# Patient Record
Sex: Female | Born: 1945 | Race: White | Hispanic: No | Marital: Married | Smoking: Former smoker
Health system: Southern US, Community
[De-identification: ages and names within clinical notes are randomized; demographics above are authoritative.]

## PROBLEM LIST (undated history)

## (undated) DIAGNOSIS — M8580 Other specified disorders of bone density and structure, unspecified site: Secondary | ICD-10-CM

## (undated) DIAGNOSIS — Z789 Other specified health status: Secondary | ICD-10-CM

## (undated) DIAGNOSIS — K219 Gastro-esophageal reflux disease without esophagitis: Secondary | ICD-10-CM

## (undated) DIAGNOSIS — Z8719 Personal history of other diseases of the digestive system: Secondary | ICD-10-CM

## (undated) DIAGNOSIS — Z972 Presence of dental prosthetic device (complete) (partial): Secondary | ICD-10-CM

## (undated) DIAGNOSIS — M810 Age-related osteoporosis without current pathological fracture: Secondary | ICD-10-CM

## (undated) DIAGNOSIS — J479 Bronchiectasis, uncomplicated: Secondary | ICD-10-CM

## (undated) DIAGNOSIS — K449 Diaphragmatic hernia without obstruction or gangrene: Secondary | ICD-10-CM

## (undated) DIAGNOSIS — R32 Unspecified urinary incontinence: Secondary | ICD-10-CM

## (undated) DIAGNOSIS — M1990 Unspecified osteoarthritis, unspecified site: Secondary | ICD-10-CM

## (undated) DIAGNOSIS — R42 Dizziness and giddiness: Secondary | ICD-10-CM

## (undated) DIAGNOSIS — G4762 Sleep related leg cramps: Secondary | ICD-10-CM

## (undated) DIAGNOSIS — F329 Major depressive disorder, single episode, unspecified: Secondary | ICD-10-CM

## (undated) DIAGNOSIS — E7800 Pure hypercholesterolemia, unspecified: Secondary | ICD-10-CM

## (undated) DIAGNOSIS — Z973 Presence of spectacles and contact lenses: Secondary | ICD-10-CM

## (undated) DIAGNOSIS — R002 Palpitations: Secondary | ICD-10-CM

## (undated) DIAGNOSIS — R7303 Prediabetes: Secondary | ICD-10-CM

## (undated) DIAGNOSIS — K08109 Complete loss of teeth, unspecified cause, unspecified class: Secondary | ICD-10-CM

## (undated) DIAGNOSIS — E785 Hyperlipidemia, unspecified: Secondary | ICD-10-CM

## (undated) DIAGNOSIS — K5090 Crohn's disease, unspecified, without complications: Secondary | ICD-10-CM

## (undated) DIAGNOSIS — IMO0001 Reserved for inherently not codable concepts without codable children: Secondary | ICD-10-CM

## (undated) DIAGNOSIS — D649 Anemia, unspecified: Secondary | ICD-10-CM

## (undated) DIAGNOSIS — R0600 Dyspnea, unspecified: Secondary | ICD-10-CM

## (undated) DIAGNOSIS — G4730 Sleep apnea, unspecified: Secondary | ICD-10-CM

## (undated) DIAGNOSIS — K5790 Diverticulosis of intestine, part unspecified, without perforation or abscess without bleeding: Secondary | ICD-10-CM

## (undated) DIAGNOSIS — E538 Deficiency of other specified B group vitamins: Secondary | ICD-10-CM

## (undated) HISTORY — PX: SHOULDER SURGERY: SHX246

## (undated) HISTORY — DX: Reserved for inherently not codable concepts without codable children: IMO0001

## (undated) HISTORY — PX: FOOT SURGERY: SHX648

## (undated) HISTORY — PX: CHOLECYSTECTOMY: SHX55

## (undated) HISTORY — PX: NO PAST SURGERIES: SHX2092

## (undated) HISTORY — DX: Crohn's disease, unspecified, without complications: K50.90

## (undated) HISTORY — PX: ABDOMINAL HYSTERECTOMY: SHX81

## (undated) HISTORY — PX: APPENDECTOMY: SHX54

## (undated) HISTORY — DX: Bronchiectasis, uncomplicated: J47.9

## (undated) HISTORY — PX: OTHER SURGICAL HISTORY: SHX169

## (undated) HISTORY — DX: Gastro-esophageal reflux disease without esophagitis: K21.9

## (undated) HISTORY — PX: HAMMER TOE SURGERY: SHX385

## (undated) HISTORY — DX: Unspecified osteoarthritis, unspecified site: M19.90

## (undated) HISTORY — DX: Sleep apnea, unspecified: G47.30

## (undated) HISTORY — PX: DILATION AND CURETTAGE OF UTERUS: SHX78

---

## 2002-04-20 ENCOUNTER — Ambulatory Visit (HOSPITAL_BASED_OUTPATIENT_CLINIC_OR_DEPARTMENT_OTHER): Admission: RE | Admit: 2002-04-20 | Discharge: 2002-04-20 | Payer: Self-pay | Admitting: Orthopaedic Surgery

## 2004-02-15 ENCOUNTER — Ambulatory Visit: Payer: Self-pay | Admitting: Internal Medicine

## 2004-03-04 HISTORY — PX: JOINT REPLACEMENT: SHX530

## 2004-07-11 ENCOUNTER — Ambulatory Visit: Payer: Self-pay | Admitting: Unknown Physician Specialty

## 2004-08-30 ENCOUNTER — Ambulatory Visit: Payer: Self-pay | Admitting: Gastroenterology

## 2004-12-31 ENCOUNTER — Ambulatory Visit: Payer: Self-pay | Admitting: Unknown Physician Specialty

## 2005-03-22 ENCOUNTER — Ambulatory Visit: Payer: Self-pay

## 2005-09-25 ENCOUNTER — Ambulatory Visit: Payer: Self-pay | Admitting: Unknown Physician Specialty

## 2006-05-28 ENCOUNTER — Emergency Department: Payer: Self-pay | Admitting: Emergency Medicine

## 2006-05-28 ENCOUNTER — Other Ambulatory Visit: Payer: Self-pay

## 2006-05-29 ENCOUNTER — Ambulatory Visit: Payer: Self-pay | Admitting: Emergency Medicine

## 2006-09-30 ENCOUNTER — Ambulatory Visit: Payer: Self-pay | Admitting: Unknown Physician Specialty

## 2007-10-29 ENCOUNTER — Ambulatory Visit: Payer: Self-pay | Admitting: Unknown Physician Specialty

## 2008-10-26 ENCOUNTER — Ambulatory Visit: Payer: Self-pay | Admitting: Gastroenterology

## 2008-11-22 ENCOUNTER — Ambulatory Visit: Payer: Self-pay | Admitting: Unknown Physician Specialty

## 2009-02-28 ENCOUNTER — Ambulatory Visit: Payer: Self-pay | Admitting: Unknown Physician Specialty

## 2009-11-13 ENCOUNTER — Emergency Department: Payer: Self-pay | Admitting: Emergency Medicine

## 2009-11-15 ENCOUNTER — Ambulatory Visit: Payer: Self-pay | Admitting: Unknown Physician Specialty

## 2009-12-18 ENCOUNTER — Ambulatory Visit: Payer: Self-pay | Admitting: Unknown Physician Specialty

## 2010-10-02 ENCOUNTER — Emergency Department: Payer: Self-pay

## 2010-10-08 ENCOUNTER — Ambulatory Visit: Payer: Self-pay | Admitting: Unknown Physician Specialty

## 2010-12-11 ENCOUNTER — Ambulatory Visit: Payer: Self-pay | Admitting: Unknown Physician Specialty

## 2011-04-09 ENCOUNTER — Ambulatory Visit: Payer: Self-pay | Admitting: Unknown Physician Specialty

## 2011-04-23 ENCOUNTER — Ambulatory Visit: Payer: Self-pay | Admitting: Specialist

## 2011-05-01 ENCOUNTER — Ambulatory Visit: Payer: Self-pay | Admitting: Unknown Physician Specialty

## 2011-05-12 ENCOUNTER — Ambulatory Visit: Payer: Self-pay | Admitting: Specialist

## 2012-06-05 ENCOUNTER — Ambulatory Visit: Payer: Self-pay | Admitting: Unknown Physician Specialty

## 2012-09-23 ENCOUNTER — Ambulatory Visit: Payer: Self-pay | Admitting: Gastroenterology

## 2012-09-24 LAB — PATHOLOGY REPORT

## 2012-12-22 ENCOUNTER — Ambulatory Visit: Payer: Self-pay | Admitting: Podiatry

## 2012-12-28 ENCOUNTER — Encounter: Payer: Self-pay | Admitting: *Deleted

## 2012-12-29 ENCOUNTER — Ambulatory Visit: Payer: Medicare Other | Admitting: Podiatry

## 2012-12-29 ENCOUNTER — Encounter: Payer: Self-pay | Admitting: Podiatry

## 2012-12-29 VITALS — BP 123/70 | HR 70 | Resp 16 | Ht 62.0 in | Wt 155.0 lb

## 2012-12-29 DIAGNOSIS — L600 Ingrowing nail: Secondary | ICD-10-CM

## 2012-12-29 NOTE — Patient Instructions (Signed)

## 2012-12-29 NOTE — Progress Notes (Signed)
Subjective:     Patient ID: Kimberly Andrade, female   DOB: 02/19/1946, 67 y.o.   MRN: [id number]  Toe Pain    patient presents stating my left big toe nail is thick and is becoming painful and I want it removed like my right big toenail   Review of Systems     Objective:   Physical Exam  Nursing note and vitals reviewed. Constitutional: She is oriented to person, place, and time. She appears well-developed and well-nourished.  Cardiovascular: Intact distal pulses.   Neurological: She is oriented to person, place, and time.   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 Infiltrated the hallux 60 mg Xylocaine Marcaine mixture and removed the nailbed and applied chemical phenol followed by alcohol 3 applications 30 seconds followed by the lavage with alcohol sterile dressing applied and patient will be easy on this for the next several day

## 2013-05-03 ENCOUNTER — Emergency Department: Payer: Self-pay | Admitting: Emergency Medicine

## 2013-05-24 ENCOUNTER — Encounter (HOSPITAL_BASED_OUTPATIENT_CLINIC_OR_DEPARTMENT_OTHER): Payer: Self-pay | Admitting: *Deleted

## 2013-05-24 NOTE — Progress Notes (Signed)
No cardiac problems but has been sent for stress test 2 yr ago-will try to get-no labs do

## 2013-05-24 NOTE — H&P (Signed)
Kimberly Andrade is an 68 y.o. female.   Chief Complaint: Severe right knee pain HPI: Kimberly Andrade is complaining of severe right knee pain that is keeping her from activities of daily living.  She has had a recent injection, which made minimal benefit.  Recent MRI scan dated 05/21/13 shows a medial meniscus tear.  She is having medial joint line pain.  It is very painful for her even night pain despite injection.  Have discussed proceeding with knee arthroscopy right side to take care of the meniscus relieve her pain and improve function.  Past Medical History  Diagnosis Date  . Arthritis   . Reflux   . Crohn disease   . Sleep apnea     uses a cpap  . Full dentures   . Wears glasses     Past Surgical History  Procedure Laterality Date  . Abdominal hysterectomy    . Gallbladder surgery    . Carpel tunnel    . Shoulder surgery Right     X 3  . Foot surgery Right   . Appendectomy    . Colonoscopy    . Upper gi endoscopy      History reviewed. No pertinent family history. Social History:  reports that she quit smoking about 11 years ago. She has never used smokeless tobacco. She reports that she does not drink alcohol or use illicit drugs.  Allergies:  Allergies  Allergen Reactions  . Codeine     gastritis  . Fosamax [Alendronate Sodium]     Bones hurt  . Hydrocodone Nausea And Vomiting  . Lipitor [Atorvastatin]     pain    No prescriptions prior to admission    No results found for this or any previous visit (from the past 48 hour(s)). No results found.  Review of Systems  Constitutional: Negative.   HENT: Negative.   Eyes: Negative.   Respiratory: Negative.   Cardiovascular: Negative.   Gastrointestinal: Negative.   Genitourinary: Negative.   Musculoskeletal: Positive for joint pain.  Skin: Negative.   Neurological: Negative.   Endo/Heme/Allergies: Negative.   Psychiatric/Behavioral: Negative.     Height 5\' 2"  (1.575 m), weight 70.308 kg (155 lb). Physical Exam   Constitutional: She appears well-developed.  HENT:  Head: Normocephalic.  Eyes: Pupils are equal, round, and reactive to light.  Neck: Normal range of motion.  Cardiovascular: Normal heart sounds.   Respiratory: Effort normal.  GI: Soft.  Musculoskeletal:  Right knee exam: Tracy effusion with motion 0-100.  Severe pain medial joint line to palpation, McMurray  testing positive for medial pain.  Ligaments tight.  Neurological: She is alert.  Skin: Skin is warm.  Psychiatric: She has a normal mood and affect.     Assessment/Plan Assessment: Right knee meniscus tear by MRI scan. Plan: Imari is having terrible pain in her right knee.  We've discussed proceeding with a arthroscopy to take care of the torn medial meniscus.  We have discussed the risks of anesthesia, infection as well as DVT associated with arthroscopy.  She will probably need postoperative physical therapy to optimize her results.  Jacquese Cassarino R 05/24/2013, 6:12 PM

## 2013-05-25 ENCOUNTER — Encounter (HOSPITAL_BASED_OUTPATIENT_CLINIC_OR_DEPARTMENT_OTHER): Payer: Self-pay

## 2013-05-25 ENCOUNTER — Other Ambulatory Visit: Payer: Self-pay | Admitting: Orthopaedic Surgery

## 2013-05-25 ENCOUNTER — Encounter (HOSPITAL_BASED_OUTPATIENT_CLINIC_OR_DEPARTMENT_OTHER): Payer: Medicare Other | Admitting: Anesthesiology

## 2013-05-25 ENCOUNTER — Encounter (HOSPITAL_BASED_OUTPATIENT_CLINIC_OR_DEPARTMENT_OTHER)
Admission: RE | Disposition: A | Discharge status: Home / Self Care | Payer: Self-pay | Source: Ambulatory Visit | Attending: Orthopaedic Surgery

## 2013-05-25 ENCOUNTER — Ambulatory Visit (HOSPITAL_BASED_OUTPATIENT_CLINIC_OR_DEPARTMENT_OTHER): Payer: Medicare Other | Admitting: Anesthesiology

## 2013-05-25 ENCOUNTER — Ambulatory Visit (HOSPITAL_BASED_OUTPATIENT_CLINIC_OR_DEPARTMENT_OTHER)
Admission: RE | Admit: 2013-05-25 | Discharge: 2013-05-25 | Disposition: A | Discharge status: Home / Self Care | Payer: Medicare Other | Source: Ambulatory Visit | Attending: Orthopaedic Surgery | Admitting: Orthopaedic Surgery

## 2013-05-25 DIAGNOSIS — K5090 Crohn's disease, unspecified, without complications: Secondary | ICD-10-CM | POA: Insufficient documentation

## 2013-05-25 DIAGNOSIS — M1710 Unilateral primary osteoarthritis, unspecified knee: Secondary | ICD-10-CM | POA: Insufficient documentation

## 2013-05-25 DIAGNOSIS — Z87891 Personal history of nicotine dependence: Secondary | ICD-10-CM | POA: Insufficient documentation

## 2013-05-25 DIAGNOSIS — K219 Gastro-esophageal reflux disease without esophagitis: Secondary | ICD-10-CM | POA: Insufficient documentation

## 2013-05-25 DIAGNOSIS — G4730 Sleep apnea, unspecified: Secondary | ICD-10-CM | POA: Insufficient documentation

## 2013-05-25 DIAGNOSIS — M25561 Pain in right knee: Secondary | ICD-10-CM

## 2013-05-25 DIAGNOSIS — M23329 Other meniscus derangements, posterior horn of medial meniscus, unspecified knee: Secondary | ICD-10-CM | POA: Insufficient documentation

## 2013-05-25 HISTORY — DX: Presence of spectacles and contact lenses: Z97.3

## 2013-05-25 HISTORY — PX: KNEE ARTHROSCOPY: SHX127

## 2013-05-25 HISTORY — DX: Complete loss of teeth, unspecified cause, unspecified class: K08.109

## 2013-05-25 HISTORY — DX: Complete loss of teeth, unspecified cause, unspecified class: Z97.2

## 2013-05-25 LAB — POCT HEMOGLOBIN-HEMACUE: Hemoglobin: 13.3 g/dL (ref 12.0–15.0)

## 2013-05-25 SURGERY — ARTHROSCOPY, KNEE
Anesthesia: General | Site: Knee | Laterality: Right

## 2013-05-25 MED ORDER — FENTANYL CITRATE 0.05 MG/ML IJ SOLN
INTRAMUSCULAR | Status: DC | PRN
  Administered 2013-05-25 (×2): 25 ug via INTRAVENOUS

## 2013-05-25 MED ORDER — MIDAZOLAM HCL 2 MG/2ML IJ SOLN
INTRAMUSCULAR | Status: AC
  Filled 2013-05-25: qty 2

## 2013-05-25 MED ORDER — FENTANYL CITRATE 0.05 MG/ML IJ SOLN
INTRAMUSCULAR | Status: AC
  Filled 2013-05-25: qty 2

## 2013-05-25 MED ORDER — LACTATED RINGERS IV SOLN
INTRAVENOUS | Status: DC
  Administered 2013-05-25 (×2): via INTRAVENOUS

## 2013-05-25 MED ORDER — FENTANYL CITRATE 0.05 MG/ML IJ SOLN
50.0000 ug | INTRAMUSCULAR | Status: DC | PRN
  Administered 2013-05-25: 50 ug via INTRAVENOUS

## 2013-05-25 MED ORDER — CEFAZOLIN SODIUM-DEXTROSE 2-3 GM-% IV SOLR
INTRAVENOUS | Status: DC | PRN
  Administered 2013-05-25: 2 g via INTRAVENOUS

## 2013-05-25 MED ORDER — FENTANYL CITRATE 0.05 MG/ML IJ SOLN
INTRAMUSCULAR | Status: AC
  Filled 2013-05-25: qty 4

## 2013-05-25 MED ORDER — BUPIVACAINE-EPINEPHRINE PF 0.25-1:200000 % IJ SOLN
INTRAMUSCULAR | Status: AC
  Filled 2013-05-25: qty 30

## 2013-05-25 MED ORDER — ONDANSETRON HCL 4 MG/2ML IJ SOLN
INTRAMUSCULAR | Status: DC | PRN
  Administered 2013-05-25: 4 mg via INTRAVENOUS

## 2013-05-25 MED ORDER — DEXAMETHASONE SODIUM PHOSPHATE 4 MG/ML IJ SOLN
INTRAMUSCULAR | Status: DC | PRN
  Administered 2013-05-25: 10 mg via INTRAVENOUS

## 2013-05-25 MED ORDER — PROPOFOL 10 MG/ML IV BOLUS
INTRAVENOUS | Status: DC | PRN
  Administered 2013-05-25: 150 mg via INTRAVENOUS

## 2013-05-25 MED ORDER — HYDROMORPHONE HCL PF 1 MG/ML IJ SOLN
INTRAMUSCULAR | Status: AC
  Filled 2013-05-25: qty 1

## 2013-05-25 MED ORDER — MIDAZOLAM HCL 2 MG/2ML IJ SOLN
1.0000 mg | INTRAMUSCULAR | Status: DC | PRN
  Administered 2013-05-25: 1 mg via INTRAVENOUS

## 2013-05-25 MED ORDER — LIDOCAINE HCL (CARDIAC) 20 MG/ML IV SOLN
INTRAVENOUS | Status: DC | PRN
  Administered 2013-05-25: 50 mg via INTRAVENOUS

## 2013-05-25 MED ORDER — SODIUM CHLORIDE 0.9 % IR SOLN
Status: DC | PRN
  Administered 2013-05-25: 3000 mL

## 2013-05-25 MED ORDER — METHYLPREDNISOLONE ACETATE 80 MG/ML IJ SUSP
INTRAMUSCULAR | Status: DC | PRN
  Administered 2013-05-25: 80 mg

## 2013-05-25 MED ORDER — PROPOFOL 10 MG/ML IV BOLUS
INTRAVENOUS | Status: AC
  Filled 2013-05-25: qty 20

## 2013-05-25 MED ORDER — HYDROMORPHONE HCL PF 1 MG/ML IJ SOLN
0.2500 mg | INTRAMUSCULAR | Status: DC | PRN
  Administered 2013-05-25 (×2): 0.5 mg via INTRAVENOUS

## 2013-05-25 SURGICAL SUPPLY — 40 items
BANDAGE ELASTIC 6 VELCRO ST LF (GAUZE/BANDAGES/DRESSINGS) ×3 IMPLANT
BLADE CUDA 5.5 (BLADE) IMPLANT
BLADE GREAT WHITE 4.2 (BLADE) ×2 IMPLANT
BLADE GREAT WHITE 4.2MM (BLADE) ×1
BNDG GAUZE ELAST 4 BULKY (GAUZE/BANDAGES/DRESSINGS) ×3 IMPLANT
CANISTER SUCT 3000ML (MISCELLANEOUS) IMPLANT
DRAPE ARTHROSCOPY W/POUCH 114 (DRAPES) ×3 IMPLANT
DRAPE U-SHAPE 47X51 STRL (DRAPES) ×3 IMPLANT
DRSG EMULSION OIL 3X3 NADH (GAUZE/BANDAGES/DRESSINGS) ×3 IMPLANT
DURAPREP 26ML APPLICATOR (WOUND CARE) ×3 IMPLANT
ELECT MENISCUS 165MM 90D (ELECTRODE) IMPLANT
ELECT REM PT RETURN 9FT ADLT (ELECTROSURGICAL)
ELECTRODE REM PT RTRN 9FT ADLT (ELECTROSURGICAL) IMPLANT
GLOVE BIO SURGEON STRL SZ8.5 (GLOVE) IMPLANT
GLOVE BIOGEL PI IND STRL 8 (GLOVE) ×1 IMPLANT
GLOVE BIOGEL PI IND STRL 8.5 (GLOVE) ×1 IMPLANT
GLOVE BIOGEL PI INDICATOR 8 (GLOVE) ×2
GLOVE BIOGEL PI INDICATOR 8.5 (GLOVE) ×2
GLOVE SS BIOGEL STRL SZ 8 (GLOVE) ×1 IMPLANT
GLOVE SUPERSENSE BIOGEL SZ 8 (GLOVE) ×2
GOWN STRL REUS W/ TWL LRG LVL3 (GOWN DISPOSABLE) ×1 IMPLANT
GOWN STRL REUS W/ TWL XL LVL3 (GOWN DISPOSABLE) ×1 IMPLANT
GOWN STRL REUS W/TWL LRG LVL3 (GOWN DISPOSABLE) ×2
GOWN STRL REUS W/TWL XL LVL3 (GOWN DISPOSABLE) ×2
IV NS IRRIG 3000ML ARTHROMATIC (IV SOLUTION) ×3 IMPLANT
KNEE WRAP E Z 3 GEL PACK (MISCELLANEOUS) ×3 IMPLANT
MANIFOLD NEPTUNE II (INSTRUMENTS) ×3 IMPLANT
PACK ARTHROSCOPY DSU (CUSTOM PROCEDURE TRAY) ×3 IMPLANT
PACK BASIN DAY SURGERY FS (CUSTOM PROCEDURE TRAY) ×3 IMPLANT
PENCIL BUTTON HOLSTER BLD 10FT (ELECTRODE) IMPLANT
SET ARTHROSCOPY TUBING (MISCELLANEOUS) ×2
SET ARTHROSCOPY TUBING LN (MISCELLANEOUS) ×1 IMPLANT
SHEET MEDIUM DRAPE 40X70 STRL (DRAPES) ×3 IMPLANT
SPONGE GAUZE 4X4 12PLY (GAUZE/BANDAGES/DRESSINGS) ×3 IMPLANT
SYR 3ML 18GX1 1/2 (SYRINGE) IMPLANT
TOWEL OR 17X24 6PK STRL BLUE (TOWEL DISPOSABLE) ×3 IMPLANT
TOWEL OR NON WOVEN STRL DISP B (DISPOSABLE) ×3 IMPLANT
WAND 30 DEG SABER W/CORD (SURGICAL WAND) IMPLANT
WAND STAR VAC 90 (SURGICAL WAND) IMPLANT
WATER STERILE IRR 1000ML POUR (IV SOLUTION) ×3 IMPLANT

## 2013-05-25 NOTE — Interval H&P Note (Signed)
History and Physical Interval Note:  05/25/2013 3:01 PM  Kimberly Andrade  has presented today for surgery, with the diagnosis of Dixon  The various methods of treatment have been discussed with the patient and family. After consideration of risks, benefits and other options for treatment, the patient has consented to  Procedure(s): ARTHROSCOPY KNEE (Right) as a surgical intervention .  The patient's history has been reviewed, patient examined, no change in status, stable for surgery.  I have reviewed the patient's chart and labs.  Questions were answered to the patient's satisfaction.     [name] G

## 2013-05-25 NOTE — Anesthesia Preprocedure Evaluation (Signed)
Anesthesia Evaluation  Patient identified by MRN, date of birth, ID band Patient awake    Reviewed: Allergy & Precautions, H&P , NPO status , Patient's Chart, lab work & pertinent test results  Airway Mallampati: II TM Distance: >3 FB Neck ROM: Full    Dental no notable dental hx. (+) Edentulous Upper, Edentulous Lower, Dental Advisory Given   Pulmonary sleep apnea and Continuous Positive Airway Pressure Ventilation , former smoker,  breath sounds clear to auscultation  Pulmonary exam normal       Cardiovascular negative cardio ROS  Rhythm:Regular Rate:Normal     Neuro/Psych negative neurological ROS  negative psych ROS   GI/Hepatic Neg liver ROS, Crohn's Dz   Endo/Other  negative endocrine ROS  Renal/GU negative Renal ROS  negative genitourinary   Musculoskeletal   Abdominal   Peds  Hematology negative hematology ROS (+)   Anesthesia Other Findings   Reproductive/Obstetrics negative OB ROS                           Anesthesia Physical Anesthesia Plan  ASA: III  Anesthesia Plan: General   Post-op Pain Management:    Induction: Intravenous  Airway Management Planned: LMA  Additional Equipment:   Intra-op Plan:   Post-operative Plan: Extubation in OR  Informed Consent: I have reviewed the patients History and Physical, chart, labs and discussed the procedure including the risks, benefits and alternatives for the proposed anesthesia with the patient or authorized representative who has indicated his/her understanding and acceptance.   Dental advisory given  Plan Discussed with: CRNA  Anesthesia Plan Comments:         Anesthesia Quick Evaluation

## 2013-05-25 NOTE — Anesthesia Postprocedure Evaluation (Signed)
Anesthesia Post Note  Patient: Kimberly Andrade  Procedure(s) Performed: Procedure(s) (LRB): ARTHROSCOPY KNEE (Right)  Anesthesia type: General  Patient location: PACU  Post pain: Pain level controlled  Post assessment: Patient's Cardiovascular Status Stable  Last Vitals:  Filed Vitals:   05/25/13 1615  BP: 134/74  Pulse: 57  Temp:   Resp: 14    Post vital signs: Reviewed and stable  Level of consciousness: alert  Complications: No apparent anesthesia complications

## 2013-05-25 NOTE — Anesthesia Procedure Notes (Signed)
Procedure Name: LMA Insertion Date/Time: 05/25/2013 3:13 PM Performed by: Maryella Shivers Pre-anesthesia Checklist: Patient identified, Emergency Drugs available, Suction available and Patient being monitored Patient Re-evaluated:Patient Re-evaluated prior to inductionOxygen Delivery Method: Circle System Utilized Preoxygenation: Pre-oxygenation with 100% oxygen Intubation Type: IV induction Ventilation: Mask ventilation without difficulty LMA: LMA inserted LMA Size: 4.0 Number of attempts: 1 Airway Equipment and Method: bite block Placement Confirmation: positive ETCO2 Tube secured with: Tape Dental Injury: Teeth and Oropharynx as per pre-operative assessment

## 2013-05-25 NOTE — Transfer of Care (Signed)
Immediate Anesthesia Transfer of Care Note  Patient: Kimberly Andrade  Procedure(s) Performed: Procedure(s): ARTHROSCOPY KNEE (Right)  Patient Location: PACU  Anesthesia Type:General  Level of Consciousness: awake, alert  and oriented  Airway & Oxygen Therapy: Patient Spontanous Breathing and Patient connected to face mask oxygen  Post-op Assessment: Report given to PACU RN and Post -op Vital signs reviewed and stable  Post vital signs: Reviewed and stable  Complications: No apparent anesthesia complications

## 2013-05-25 NOTE — Progress Notes (Signed)
Assisted Dr. Fitzgerald with right, knee block. Side rails up, monitors on throughout procedure. See vital signs in flow sheet. Tolerated Procedure well. 

## 2013-05-25 NOTE — Op Note (Signed)
#  [id number] 

## 2013-05-25 NOTE — Discharge Instructions (Signed)

## 2013-05-26 ENCOUNTER — Encounter (HOSPITAL_BASED_OUTPATIENT_CLINIC_OR_DEPARTMENT_OTHER): Payer: Self-pay | Admitting: Orthopaedic Surgery

## 2013-05-28 NOTE — Op Note (Signed)
NAMEVALORA, Kimberly Andrade NO.:  [id number]  MEDICAL RECORD NO.:  [id number]  LOCATION:                                 FACILITY:  PHYSICIAN:  Monico Blitz. Javia Dillow, M.D.DATE OF BIRTH:  [birth year]  DATE OF PROCEDURE:  05/25/2013 DATE OF DISCHARGE:  05/25/2013                              OPERATIVE REPORT   PREOPERATIVE DIAGNOSES: 1. Right knee torn medial meniscus. 2. Right knee degenerative joint disease.  POSTOPERATIVE DIAGNOSES: 1. Right knee torn medial meniscus. 2. Right knee degenerative joint disease.  PROCEDURES: 1. Right knee partial meniscectomy. 2. Right knee abrasion, chondroplasty patellofemoral.  ANESTHESIA:  General and block.  ATTENDING SURGEON:  Monico Blitz. Rhona Raider, MD  ASSISTANTDorris Fetch, PA  INDICATION FOR PROCEDURE:  The patient is a 68 year old woman with a long history of intense right knee pain.  This is not responded to injections Normocephalic, atraumatic to pills or bracing.  By MRI scan, she has a significant medial meniscus tear.  She has pain which makes it difficult for her to rest and walk and she is offered an arthroscopy. Informed operative consent was obtained after discussion of possible complications including reaction to anesthesia and infection.  SUMMARY OF FINDINGS AND PROCEDURE:  Under general anesthesia and a block, a right knee arthroscopy was performed.  Suprapatellar pouch was benign while the patellofemoral joint exhibited some grade 4 change in a relatively broad area inter-trochlear groove.  This was addressed with abrasion to bleeding bone of some small areas.  The medial compartment exhibited a posterior horn radial tear consistent with her scan.  About 15% partial medial meniscectomy was done back to stable tissues.  She had grade 2 and 3 change in this compartment, but no bare bone.  The lateral compartment exhibited no evidence of meniscal or articular cartilage injury.  The ACL was normal.  She was injected  with the usual agents plus Depo-Medrol and discharged home the same day to follow up in less than a week.  DESCRIPTION OF PROCEDURE:  The patient was taken to an operating suite where general anesthetic was applied without difficulty.  She was also given a block in the pre-anesthesia area.  She was positioned supine and prepped and draped in normal sterile fashion.  After the administration of IV Kefzol and an appropriate time out, an arthroscopy of the right knee was performed through a total of 2 portals.  Findings were as noted above and procedure consisted predominantly.  The partial medial meniscectomy, which was done with basket and shaver removing about 15% of this structure predominantly the posterior horn back to stable tissues.  We then performed the abrasion as listed above.  The knee was thoroughly irrigated followed by placement of Marcaine with epinephrine and morphine plus some Depo-Medrol.  Adaptic was placed over the portals followed by dry gauze and loose Ace wrap.  Estimated blood loss and fluids obtained from anesthesia records.  DISPOSITION:  The patient was extubated in the operating room and taken to recovery in stable condition.  She was to go home same-day and follow up in the office next week.  I will contact her by phone tonight.     Collier Salina  Autumn Patty, [name]   ______________________________ Monico Blitz. Rhona Raider, [name]    PGD/MEDQ  D:  05/25/2013  T:  05/25/2013  Job:  [id number]

## 2013-06-30 ENCOUNTER — Ambulatory Visit: Payer: Self-pay | Admitting: Internal Medicine

## 2013-11-18 DIAGNOSIS — G4733 Obstructive sleep apnea (adult) (pediatric): Secondary | ICD-10-CM | POA: Insufficient documentation

## 2014-06-21 DIAGNOSIS — E538 Deficiency of other specified B group vitamins: Secondary | ICD-10-CM | POA: Insufficient documentation

## 2014-06-28 ENCOUNTER — Other Ambulatory Visit: Payer: Self-pay | Admitting: Internal Medicine

## 2014-06-28 DIAGNOSIS — Z1231 Encounter for screening mammogram for malignant neoplasm of breast: Secondary | ICD-10-CM

## 2014-07-05 ENCOUNTER — Ambulatory Visit
Admission: RE | Admit: 2014-07-05 | Discharge: 2014-07-05 | Disposition: A | Discharge status: Home / Self Care | Payer: Medicare Other | Source: Ambulatory Visit | Attending: Internal Medicine | Admitting: Internal Medicine

## 2014-07-05 ENCOUNTER — Other Ambulatory Visit: Payer: Self-pay | Admitting: Internal Medicine

## 2014-07-05 DIAGNOSIS — Z1231 Encounter for screening mammogram for malignant neoplasm of breast: Secondary | ICD-10-CM | POA: Diagnosis not present

## 2015-04-24 DIAGNOSIS — K5000 Crohn's disease of small intestine without complications: Secondary | ICD-10-CM | POA: Insufficient documentation

## 2015-04-24 DIAGNOSIS — K219 Gastro-esophageal reflux disease without esophagitis: Secondary | ICD-10-CM | POA: Insufficient documentation

## 2015-06-22 ENCOUNTER — Other Ambulatory Visit: Payer: Self-pay | Admitting: Internal Medicine

## 2015-06-22 DIAGNOSIS — Z1239 Encounter for other screening for malignant neoplasm of breast: Secondary | ICD-10-CM

## 2015-06-22 LAB — HM HEPATITIS C SCREENING LAB: HM Hepatitis Screen: NEGATIVE

## 2015-07-17 ENCOUNTER — Other Ambulatory Visit: Payer: Self-pay | Admitting: Internal Medicine

## 2015-07-17 ENCOUNTER — Ambulatory Visit
Admission: RE | Admit: 2015-07-17 | Discharge: 2015-07-17 | Disposition: A | Discharge status: Home / Self Care | Payer: Medicare Other | Source: Ambulatory Visit | Attending: Internal Medicine | Admitting: Internal Medicine

## 2015-07-17 DIAGNOSIS — Z1231 Encounter for screening mammogram for malignant neoplasm of breast: Secondary | ICD-10-CM

## 2015-07-17 DIAGNOSIS — Z1239 Encounter for other screening for malignant neoplasm of breast: Secondary | ICD-10-CM

## 2015-10-05 DIAGNOSIS — E782 Mixed hyperlipidemia: Secondary | ICD-10-CM | POA: Insufficient documentation

## 2015-11-03 ENCOUNTER — Other Ambulatory Visit: Payer: Self-pay | Admitting: Gastroenterology

## 2015-11-03 DIAGNOSIS — R1310 Dysphagia, unspecified: Secondary | ICD-10-CM

## 2015-11-03 DIAGNOSIS — K219 Gastro-esophageal reflux disease without esophagitis: Secondary | ICD-10-CM

## 2015-11-10 ENCOUNTER — Ambulatory Visit
Admission: RE | Admit: 2015-11-10 | Discharge: 2015-11-10 | Disposition: A | Discharge status: Home / Self Care | Payer: Medicare Other | Source: Ambulatory Visit | Attending: Gastroenterology | Admitting: Gastroenterology

## 2015-11-10 DIAGNOSIS — K449 Diaphragmatic hernia without obstruction or gangrene: Secondary | ICD-10-CM | POA: Insufficient documentation

## 2015-11-10 DIAGNOSIS — K219 Gastro-esophageal reflux disease without esophagitis: Secondary | ICD-10-CM | POA: Insufficient documentation

## 2015-11-10 DIAGNOSIS — R1310 Dysphagia, unspecified: Secondary | ICD-10-CM | POA: Insufficient documentation

## 2016-03-08 ENCOUNTER — Encounter: Payer: Self-pay | Admitting: *Deleted

## 2016-03-11 ENCOUNTER — Ambulatory Visit: Payer: Medicare Other | Admitting: Anesthesiology

## 2016-03-11 ENCOUNTER — Encounter: Payer: Self-pay | Admitting: *Deleted

## 2016-03-11 ENCOUNTER — Encounter
Admission: RE | Disposition: A | Discharge status: Home / Self Care | Payer: Self-pay | Source: Ambulatory Visit | Attending: Gastroenterology

## 2016-03-11 ENCOUNTER — Ambulatory Visit
Admission: RE | Admit: 2016-03-11 | Discharge: 2016-03-11 | Disposition: A | Discharge status: Home / Self Care | Payer: Medicare Other | Source: Ambulatory Visit | Attending: Gastroenterology | Admitting: Gastroenterology

## 2016-03-11 DIAGNOSIS — F329 Major depressive disorder, single episode, unspecified: Secondary | ICD-10-CM | POA: Insufficient documentation

## 2016-03-11 DIAGNOSIS — M797 Fibromyalgia: Secondary | ICD-10-CM | POA: Diagnosis not present

## 2016-03-11 DIAGNOSIS — K5730 Diverticulosis of large intestine without perforation or abscess without bleeding: Secondary | ICD-10-CM | POA: Diagnosis not present

## 2016-03-11 DIAGNOSIS — K449 Diaphragmatic hernia without obstruction or gangrene: Secondary | ICD-10-CM | POA: Insufficient documentation

## 2016-03-11 DIAGNOSIS — K210 Gastro-esophageal reflux disease with esophagitis: Secondary | ICD-10-CM | POA: Insufficient documentation

## 2016-03-11 DIAGNOSIS — M810 Age-related osteoporosis without current pathological fracture: Secondary | ICD-10-CM | POA: Insufficient documentation

## 2016-03-11 DIAGNOSIS — K5090 Crohn's disease, unspecified, without complications: Secondary | ICD-10-CM | POA: Insufficient documentation

## 2016-03-11 DIAGNOSIS — M1990 Unspecified osteoarthritis, unspecified site: Secondary | ICD-10-CM | POA: Diagnosis not present

## 2016-03-11 DIAGNOSIS — K2940 Chronic atrophic gastritis without bleeding: Secondary | ICD-10-CM | POA: Diagnosis not present

## 2016-03-11 DIAGNOSIS — Z7982 Long term (current) use of aspirin: Secondary | ICD-10-CM | POA: Diagnosis not present

## 2016-03-11 DIAGNOSIS — R1310 Dysphagia, unspecified: Secondary | ICD-10-CM | POA: Insufficient documentation

## 2016-03-11 DIAGNOSIS — E7800 Pure hypercholesterolemia, unspecified: Secondary | ICD-10-CM | POA: Diagnosis not present

## 2016-03-11 DIAGNOSIS — K3189 Other diseases of stomach and duodenum: Secondary | ICD-10-CM | POA: Diagnosis present

## 2016-03-11 DIAGNOSIS — K2950 Unspecified chronic gastritis without bleeding: Secondary | ICD-10-CM | POA: Diagnosis not present

## 2016-03-11 DIAGNOSIS — K529 Noninfective gastroenteritis and colitis, unspecified: Secondary | ICD-10-CM | POA: Diagnosis not present

## 2016-03-11 DIAGNOSIS — Z79899 Other long term (current) drug therapy: Secondary | ICD-10-CM | POA: Insufficient documentation

## 2016-03-11 DIAGNOSIS — Q399 Congenital malformation of esophagus, unspecified: Secondary | ICD-10-CM | POA: Diagnosis not present

## 2016-03-11 DIAGNOSIS — G4730 Sleep apnea, unspecified: Secondary | ICD-10-CM | POA: Insufficient documentation

## 2016-03-11 HISTORY — DX: Age-related osteoporosis without current pathological fracture: M81.0

## 2016-03-11 HISTORY — PX: ESOPHAGOGASTRODUODENOSCOPY: SHX5428

## 2016-03-11 HISTORY — DX: Pure hypercholesterolemia, unspecified: E78.00

## 2016-03-11 HISTORY — DX: Major depressive disorder, single episode, unspecified: F32.9

## 2016-03-11 HISTORY — PX: COLONOSCOPY WITH PROPOFOL: SHX5780

## 2016-03-11 HISTORY — DX: Gastro-esophageal reflux disease without esophagitis: K21.9

## 2016-03-11 HISTORY — DX: Unspecified osteoarthritis, unspecified site: M19.90

## 2016-03-11 SURGERY — EGD (ESOPHAGOGASTRODUODENOSCOPY)
Anesthesia: General

## 2016-03-11 MED ORDER — PROPOFOL 10 MG/ML IV BOLUS
INTRAVENOUS | Status: DC | PRN
  Administered 2016-03-11: 80 mg via INTRAVENOUS

## 2016-03-11 MED ORDER — PROPOFOL 500 MG/50ML IV EMUL
INTRAVENOUS | Status: AC
  Filled 2016-03-11: qty 50

## 2016-03-11 MED ORDER — SODIUM CHLORIDE 0.9 % IV SOLN
INTRAVENOUS | Status: DC
  Administered 2016-03-11 (×2): via INTRAVENOUS

## 2016-03-11 MED ORDER — PROPOFOL 500 MG/50ML IV EMUL
INTRAVENOUS | Status: DC | PRN
  Administered 2016-03-11: 150 ug/kg/min via INTRAVENOUS

## 2016-03-11 MED ORDER — SODIUM CHLORIDE 0.9 % IV SOLN: INTRAVENOUS | Status: DC

## 2016-03-11 MED ORDER — PHENYLEPHRINE HCL 10 MG/ML IJ SOLN
INTRAMUSCULAR | Status: DC | PRN
  Administered 2016-03-11 (×2): 100 ug via INTRAVENOUS

## 2016-03-11 NOTE — Op Note (Signed)
[facility]astroenterology Patient Name: Kimberly Andrade Procedure Date: 03/11/2016 12:51 PM MRN: [id number] Account #: [id number] Date of Birth: 1945/12/09 Admit Type: Outpatient Age: 71 Room: [facility] ENDO ROOM 3 Gender: Female Note Status: Finalized Procedure:            Upper GI endoscopy Indications:          Dysphagia, Gastro-esophageal reflux disease Providers:            Lollie Sails, MD Referring MD:         Murlean Iba, MD (Referring MD) Medicines:            Monitored Anesthesia Care Complications:        No immediate complications. Procedure:            Pre-Anesthesia Assessment:                       - ASA Grade Assessment: III - A patient with severe                        systemic disease.                       After obtaining informed consent, the endoscope was                        passed under direct vision. Throughout the procedure,                        the patient's blood pressure, pulse, and oxygen                        saturations were monitored continuously. The Endoscope                        was introduced through the mouth, and advanced to the                        third part of duodenum. The upper GI endoscopy was                        accomplished without difficulty. The patient tolerated                        the procedure well. Findings:      The Z-line was regular. Biopsies were taken with a cold forceps for       histology.      The distal esophagus was significantly tortuous.      The exam of the stomach was otherwise normal.      A medium-sized hiatal hernia was found. The Z-line was a variable       distance from incisors; the hiatal hernia was sliding.      The examined duodenum was normal.      A single 5 mm submucosal papule (nodule) with no bleeding and no       stigmata of recent bleeding was found on the posterior wall of the       gastric antrum. This had a pillow like palpation, impression of a deep       lipoma. Biopsies were taken with a cold forceps for histology.      Diffuse and patchy mild inflammation  characterized by erythema was found       in the gastric body and in the gastric antrum.      Biopsies were taken with a cold forceps in the gastric body and in the       gastric antrum for histology. Impression:           - Z-line regular. Biopsied.                       - Tortuous esophagus.                       - Medium-sized hiatal hernia.                       - Normal examined duodenum.                       - A single submucosal papule (nodule) found in the                        stomach. Biopsied.                       - Atrophic gastritis.                       - Biopsies were taken with a cold forceps for histology                        in the gastric body and in the gastric antrum. Recommendation:       - Discharge patient to home.                       - Continue present medications.                       - Return to GI clinic in 3 weeks. Procedure Code(s):    --- Professional ---                       [phone], Esophagogastroduodenoscopy, flexible, transoral;                        with biopsy, single or multiple Diagnosis Code(s):    --- Professional ---                       Q39.9, Congenital malformation of esophagus, unspecified                       K44.9, Diaphragmatic hernia without obstruction or                        gangrene                       K31.89, Other diseases of stomach and duodenum                       K29.40, Chronic atrophic gastritis without bleeding                       R13.10, Dysphagia, unspecified  K21.9, Gastro-esophageal reflux disease without                        esophagitis CPT copyright 2016 American Medical Association. All rights reserved. The codes documented in this report are preliminary and upon coder review may  be revised to meet current compliance requirements. Lollie Sails, MD 03/11/2016  1:40:14 PM This report has been signed electronically. Number of Addenda: 0 Note Initiated On: 03/11/2016 12:51 PM      [facility]

## 2016-03-11 NOTE — Op Note (Signed)
[facility] Patient Name: Phyllistine Domingos Procedure Date: 03/11/2016 12:52 PM MRN: [id number] Account #: [id number] Date of Birth: [birth year] Admit Type: Outpatient Age: 71 Room: [facility] ENDO ROOM 1 Gender: Female Note Status: Finalized Procedure:            Colonoscopy Indications:          Personal history of Crohn's disease Providers:            Lollie Sails, MD Medicines:            Monitored Anesthesia Care Complications:        No immediate complications. Procedure:            Pre-Anesthesia Assessment:                       - ASA Grade Assessment: III - A patient with severe                        systemic disease.                       After obtaining informed consent, the colonoscope was                        passed under direct vision. Throughout the procedure,                        the patient's blood pressure, pulse, and oxygen                        saturations were monitored continuously. The                        Colonoscope was introduced through the anus and                        advanced to the the terminal ileum. The colonoscopy was                        performed without difficulty. The patient tolerated the                        procedure well. The quality of the bowel preparation                        was good. Findings:      The terminal ileum appeared normal. Biopsies were taken with a cold       forceps for histology.      Multiple medium-mouthed diverticula were found in the sigmoid colon and       descending colon.      Biopsies for histology were taken with a cold forceps from the cecum,       ascending colon, transverse colon, descending colon, sigmoid colon and       rectum for evaluation of microscopic colitis.      The digital rectal exam was normal. Impression:           - The examined portion of the ileum was normal.                        Biopsied.                       -  Diverticulosis in the  sigmoid colon and in the                        descending colon.                       - Biopsies were taken with a cold forceps from the                        cecum, ascending colon, transverse colon, descending                        colon, sigmoid colon and rectum for evaluation of                        microscopic colitis. Recommendation:       - Discharge patient to home.                       - Full liquid diet for 1 day, then advance as tolerated                        to advance diet as tolerated.                       - Await pathology results.                       - Return to GI office in 3 weeks. Procedure Code(s):    --- Professional ---                       [phone], Colonoscopy, flexible; with biopsy, single or                        multiple Diagnosis Code(s):    --- Professional ---                       Z87.19, Personal history of other diseases of the                        digestive system                       K57.30, Diverticulosis of large intestine without                        perforation or abscess without bleeding CPT copyright 2016 American Medical Association. All rights reserved. The codes documented in this report are preliminary and upon coder review may  be revised to meet current compliance requirements. Lollie Sails, MD 03/11/2016 2:15:38 PM This report has been signed electronically. Number of Addenda: 0 Note Initiated On: 03/11/2016 12:52 PM Scope Withdrawal Time: 0 hours 14 minutes 58 seconds  Total Procedure Duration: 0 hours 29 minutes 59 seconds       [facility]

## 2016-03-11 NOTE — Anesthesia Postprocedure Evaluation (Signed)
Anesthesia Post Note  Patient: ASIANNA LIAKOS  Procedure(s) Performed: Procedure(s) (LRB): ESOPHAGOGASTRODUODENOSCOPY (EGD) (N/A) COLONOSCOPY WITH PROPOFOL (N/A)  Patient location during evaluation: Endoscopy Anesthesia Type: General Level of consciousness: awake and alert and oriented Pain management: pain level controlled Vital Signs Assessment: post-procedure vital signs reviewed and stable Respiratory status: spontaneous breathing, nonlabored ventilation and respiratory function stable Cardiovascular status: blood pressure returned to baseline and stable Postop Assessment: no signs of nausea or vomiting Anesthetic complications: no     Last Vitals:  Vitals:   03/11/16 1419 03/11/16 1429  BP: (!) 96/58 101/63  Pulse: 63 (!) 58  Resp: 11 17  Temp: (!) 35.7 C     Last Pain:  Vitals:   03/11/16 1429  TempSrc:   PainSc: 3                  Ashland Wiseman

## 2016-03-11 NOTE — Transfer of Care (Signed)
Immediate Anesthesia Transfer of Care Note  Patient: Kimberly Andrade  Procedure(s) Performed: Procedure(s): ESOPHAGOGASTRODUODENOSCOPY (EGD) (N/A) COLONOSCOPY WITH PROPOFOL (N/A)  Patient Location: PACU  Anesthesia Type:General  Level of Consciousness: sedated  Airway & Oxygen Therapy: Patient Spontanous Breathing and Patient connected to nasal cannula oxygen  Post-op Assessment: Report given to RN and Post -op Vital signs reviewed and stable  Post vital signs: Reviewed and stable  Last Vitals:  Vitals:   03/11/16 1201 03/11/16 1419  BP: (!) 117/51 (!) 96/58  Pulse: 71 63  Resp: 14 11  Temp: (!) 35.9 C (!) 35.7 C    Last Pain:  Vitals:   03/11/16 1419  TempSrc: Tympanic         Complications: No apparent anesthesia complications

## 2016-03-11 NOTE — Anesthesia Preprocedure Evaluation (Signed)
Anesthesia Evaluation  Patient identified by MRN, date of birth, ID band Patient awake    Reviewed: Allergy & Precautions, NPO status , Patient's Chart, lab work & pertinent test results  History of Anesthesia Complications Negative for: history of anesthetic complications  Airway Mallampati: II  TM Distance: >3 FB Neck ROM: Full    Dental  (+) Upper Dentures, Lower Dentures   Pulmonary sleep apnea, Continuous Positive Airway Pressure Ventilation and Oxygen sleep apnea , neg COPD, former smoker,    breath sounds clear to auscultation- rhonchi (-) wheezing      Cardiovascular Exercise Tolerance: Good (-) hypertension(-) CAD and (-) Past MI  Rhythm:Regular Rate:Normal - Systolic murmurs and - Diastolic murmurs    Neuro/Psych Depression negative neurological ROS     GI/Hepatic Neg liver ROS, GERD  ,crohns disease    Endo/Other  negative endocrine ROSneg diabetes  Renal/GU negative Renal ROS     Musculoskeletal  (+) Arthritis , Fibromyalgia -  Abdominal (+) - obese,   Peds  Hematology negative hematology ROS (+)   Anesthesia Other Findings Past Medical History: No date: Arthritis No date: Crohn disease ([place]) No date: Depression No date: DJD (degenerative joint disease) No date: Fibromyalgia No date: Full dentures No date: GERD (gastroesophageal reflux disease) No date: Hypercholesterolemia No date: Osteoporosis No date: Reflux No date: Sleep apnea     Comment: uses a cpap No date: Wears glasses   Reproductive/Obstetrics                             Anesthesia Physical Anesthesia Plan  ASA: III  Anesthesia Plan: General   Post-op Pain Management:    Induction: Intravenous  Airway Management Planned: Natural Airway  Additional Equipment:   Intra-op Plan:   Post-operative Plan:   Informed Consent: I have reviewed the patients History and Physical, chart, labs and  discussed the procedure including the risks, benefits and alternatives for the proposed anesthesia with the patient or authorized representative who has indicated his/her understanding and acceptance.   Dental advisory given  Plan Discussed with: CRNA and Anesthesiologist  Anesthesia Plan Comments:         Anesthesia Quick Evaluation

## 2016-03-11 NOTE — Anesthesia Procedure Notes (Signed)
Date/Time: 03/11/2016 1:28 PM Performed by: Nelda Marseille Pre-anesthesia Checklist: Patient identified, Emergency Drugs available, Suction available, Patient being monitored and Timeout performed Oxygen Delivery Method: Nasal cannula

## 2016-03-11 NOTE — H&P (Signed)
Outpatient short stay form Pre-procedure 03/11/2016 12:59 PM Kimberly Sails MD  Primary Physician: Dr. Glendon Axe  Reason for visit:  EGD and colonoscopy  History of present illness:  Patient is a 71 year old female presenting today as above. She has a personal history of Crohn's disease that was diagnosed over 10 years ago. She is on a daily dose of Pentasa and has been stable with this her last flare being 2013. She does take an iron supplement which will occasionally dark in her stool. She has some complaint of dysphagia however a barium swallow that was done on 11/10/2015 showed a large sliding hiatal hernia with no prominent reflux and no obstruction. Her barium tablet went down without cause. He has some mild tertiary contractions. The thoracic area the lower cervical area was normal in distention. She tolerated her prep well. She takes no thinning agents or aspirin products with the exception of 81 mg aspirin that has been held for several days.    Current Facility-Administered Medications:  .  0.9 %  sodium chloride infusion, , Intravenous, Continuous, Kimberly Sails, MD, Last Rate: 20 mL/hr at 03/11/16 1220 .  0.9 %  sodium chloride infusion, , Intravenous, Continuous, Kimberly Sails, MD  Prescriptions Prior to Admission  Medication Sig Dispense Refill Last Dose  . Calcium Carb-Cholecalciferol (CALCIUM CARBONATE-VITAMIN D3 PO) Take by mouth.   Past Week at Unknown time  . Cholecalciferol (VITAMIN D-3) 1000 UNITS CAPS Take by mouth 2 (two) times daily.   Past Week at Unknown time  . cyanocobalamin 1000 MCG tablet Take 1,000 mcg by mouth daily.   Past Week at Unknown time  . ferrous sulfate 325 (65 FE) MG tablet Take 325 mg by mouth daily with breakfast.   Past Week at Unknown time  . Garlic 9449 MG CAPS Take by mouth.   Past Week at Unknown time  . Magnesium 250 MG TABS Take by mouth.   Past Week at Unknown time  . mesalamine (PENTASA) 500 MG CR capsule 500 mg. TAKE TWO  CAPSULES FOUR TIMES A DAY   Past Week at Unknown time  . Multiple Vitamin (DAILY VITAMIN PO) Take by mouth daily.   Past Week at Unknown time  . omeprazole (PRILOSEC) 40 MG capsule Take 40 mg by mouth daily.   Past Week at Unknown time  . oxybutynin (DITROPAN-XL) 10 MG 24 hr tablet Take 10 mg by mouth at bedtime.   Past Week at Unknown time  . Potassium 99 MG TABS Take by mouth.   Past Week at Unknown time  . aspirin 81 MG tablet Take 81 mg by mouth daily.   03/06/2016     Allergies  Allergen Reactions  . Codeine Other (See Comments)    gastritis  . Fosamax [Alendronate Sodium] Other (See Comments)    Bones hurt  . Hydrocodone Nausea And Vomiting  . Lipitor [Atorvastatin] Other (See Comments)    pain     Past Medical History:  Diagnosis Date  . Arthritis   . Crohn disease ([place])   . Depression   . DJD (degenerative joint disease)   . Fibromyalgia   . Full dentures   . GERD (gastroesophageal reflux disease)   . Hypercholesterolemia   . Osteoporosis   . Reflux   . Sleep apnea    uses a cpap  . Wears glasses     Review of systems:      Physical Exam    Heart and lungs: Regular rate and rhythm  without rub or gallop, lungs are bilaterally clear.    HEENT: Normocephalic atraumatic eyes are anicteric    Other:     Pertinant exam for procedure: Soft nontender nondistended bowel sounds positive normoactive.    Planned proceedures: EGD, colonoscopy and indicated procedures. I have discussed the risks benefits and complications of procedures to include not limited to bleeding, infection, perforation and the risk of sedation and the patient wishes to proceed.    Kimberly Sails, MD Gastroenterology 03/11/2016  12:59 PM

## 2016-03-12 ENCOUNTER — Encounter: Payer: Self-pay | Admitting: Gastroenterology

## 2016-03-13 LAB — SURGICAL PATHOLOGY

## 2016-04-08 DIAGNOSIS — I493 Ventricular premature depolarization: Secondary | ICD-10-CM | POA: Insufficient documentation

## 2016-07-10 ENCOUNTER — Other Ambulatory Visit: Payer: Self-pay | Admitting: Internal Medicine

## 2016-07-10 DIAGNOSIS — R42 Dizziness and giddiness: Secondary | ICD-10-CM

## 2016-07-11 ENCOUNTER — Other Ambulatory Visit: Payer: Self-pay | Admitting: Internal Medicine

## 2016-07-11 DIAGNOSIS — Z1231 Encounter for screening mammogram for malignant neoplasm of breast: Secondary | ICD-10-CM

## 2016-07-22 ENCOUNTER — Ambulatory Visit: Payer: Medicare Other

## 2016-08-05 ENCOUNTER — Ambulatory Visit
Admission: RE | Admit: 2016-08-05 | Discharge: 2016-08-05 | Disposition: A | Discharge status: Home / Self Care | Payer: Medicare Other | Source: Ambulatory Visit | Attending: Internal Medicine | Admitting: Internal Medicine

## 2016-08-05 DIAGNOSIS — Z1231 Encounter for screening mammogram for malignant neoplasm of breast: Secondary | ICD-10-CM | POA: Insufficient documentation

## 2016-08-06 ENCOUNTER — Ambulatory Visit
Admission: RE | Admit: 2016-08-06 | Discharge: 2016-08-06 | Disposition: A | Discharge status: Home / Self Care | Payer: Medicare Other | Source: Ambulatory Visit | Attending: Internal Medicine | Admitting: Internal Medicine

## 2016-08-06 DIAGNOSIS — R42 Dizziness and giddiness: Secondary | ICD-10-CM | POA: Diagnosis present

## 2016-08-06 DIAGNOSIS — I6523 Occlusion and stenosis of bilateral carotid arteries: Secondary | ICD-10-CM | POA: Insufficient documentation

## 2016-08-06 LAB — HM DEXA SCAN

## 2016-08-06 LAB — HM HEPATITIS C SCREENING LAB: HM Hepatitis Screen: NEGATIVE

## 2016-10-16 DIAGNOSIS — R32 Unspecified urinary incontinence: Secondary | ICD-10-CM | POA: Insufficient documentation

## 2016-10-16 DIAGNOSIS — M810 Age-related osteoporosis without current pathological fracture: Secondary | ICD-10-CM | POA: Insufficient documentation

## 2016-12-25 DIAGNOSIS — G4762 Sleep related leg cramps: Secondary | ICD-10-CM | POA: Insufficient documentation

## 2016-12-25 DIAGNOSIS — R7303 Prediabetes: Secondary | ICD-10-CM | POA: Insufficient documentation

## 2017-04-29 ENCOUNTER — Encounter: Payer: Self-pay | Admitting: Emergency Medicine

## 2017-04-29 ENCOUNTER — Other Ambulatory Visit: Payer: Self-pay

## 2017-04-29 ENCOUNTER — Inpatient Hospital Stay
Admission: EM | Admit: 2017-04-29 | Discharge: 2017-05-01 | DRG: 871 | Disposition: A | Discharge status: Home / Self Care | Payer: Medicare Other | Attending: Internal Medicine | Admitting: Internal Medicine

## 2017-04-29 ENCOUNTER — Emergency Department: Payer: Medicare Other

## 2017-04-29 DIAGNOSIS — J9601 Acute respiratory failure with hypoxia: Secondary | ICD-10-CM | POA: Diagnosis present

## 2017-04-29 DIAGNOSIS — J40 Bronchitis, not specified as acute or chronic: Secondary | ICD-10-CM | POA: Diagnosis not present

## 2017-04-29 DIAGNOSIS — Z9049 Acquired absence of other specified parts of digestive tract: Secondary | ICD-10-CM

## 2017-04-29 DIAGNOSIS — A4189 Other specified sepsis: Secondary | ICD-10-CM | POA: Diagnosis not present

## 2017-04-29 DIAGNOSIS — Z7982 Long term (current) use of aspirin: Secondary | ICD-10-CM

## 2017-04-29 DIAGNOSIS — J101 Influenza due to other identified influenza virus with other respiratory manifestations: Secondary | ICD-10-CM | POA: Diagnosis present

## 2017-04-29 DIAGNOSIS — Z79899 Other long term (current) drug therapy: Secondary | ICD-10-CM

## 2017-04-29 DIAGNOSIS — Z87891 Personal history of nicotine dependence: Secondary | ICD-10-CM

## 2017-04-29 DIAGNOSIS — M1990 Unspecified osteoarthritis, unspecified site: Secondary | ICD-10-CM | POA: Diagnosis present

## 2017-04-29 DIAGNOSIS — Z888 Allergy status to other drugs, medicaments and biological substances status: Secondary | ICD-10-CM

## 2017-04-29 DIAGNOSIS — K219 Gastro-esophageal reflux disease without esophagitis: Secondary | ICD-10-CM | POA: Diagnosis present

## 2017-04-29 DIAGNOSIS — Z973 Presence of spectacles and contact lenses: Secondary | ICD-10-CM

## 2017-04-29 DIAGNOSIS — K5090 Crohn's disease, unspecified, without complications: Secondary | ICD-10-CM | POA: Diagnosis present

## 2017-04-29 DIAGNOSIS — Z972 Presence of dental prosthetic device (complete) (partial): Secondary | ICD-10-CM

## 2017-04-29 DIAGNOSIS — Z885 Allergy status to narcotic agent status: Secondary | ICD-10-CM

## 2017-04-29 DIAGNOSIS — M810 Age-related osteoporosis without current pathological fracture: Secondary | ICD-10-CM | POA: Diagnosis present

## 2017-04-29 DIAGNOSIS — A419 Sepsis, unspecified organism: Secondary | ICD-10-CM | POA: Diagnosis present

## 2017-04-29 DIAGNOSIS — Z800 Family history of malignant neoplasm of digestive organs: Secondary | ICD-10-CM

## 2017-04-29 DIAGNOSIS — Z8249 Family history of ischemic heart disease and other diseases of the circulatory system: Secondary | ICD-10-CM

## 2017-04-29 DIAGNOSIS — J209 Acute bronchitis, unspecified: Secondary | ICD-10-CM | POA: Diagnosis present

## 2017-04-29 DIAGNOSIS — J9811 Atelectasis: Secondary | ICD-10-CM | POA: Diagnosis present

## 2017-04-29 DIAGNOSIS — F329 Major depressive disorder, single episode, unspecified: Secondary | ICD-10-CM | POA: Diagnosis present

## 2017-04-29 DIAGNOSIS — R0902 Hypoxemia: Secondary | ICD-10-CM | POA: Diagnosis present

## 2017-04-29 DIAGNOSIS — I471 Supraventricular tachycardia: Secondary | ICD-10-CM | POA: Diagnosis not present

## 2017-04-29 DIAGNOSIS — E7800 Pure hypercholesterolemia, unspecified: Secondary | ICD-10-CM | POA: Diagnosis present

## 2017-04-29 DIAGNOSIS — Z90710 Acquired absence of both cervix and uterus: Secondary | ICD-10-CM

## 2017-04-29 DIAGNOSIS — M797 Fibromyalgia: Secondary | ICD-10-CM | POA: Diagnosis present

## 2017-04-29 DIAGNOSIS — G4733 Obstructive sleep apnea (adult) (pediatric): Secondary | ICD-10-CM | POA: Diagnosis present

## 2017-04-29 DIAGNOSIS — J45909 Unspecified asthma, uncomplicated: Secondary | ICD-10-CM | POA: Diagnosis present

## 2017-04-29 DIAGNOSIS — E785 Hyperlipidemia, unspecified: Secondary | ICD-10-CM | POA: Diagnosis present

## 2017-04-29 LAB — CBC WITH DIFFERENTIAL/PLATELET
Basophils Absolute: 0 10*3/uL (ref 0–0.1)
Basophils Relative: 0 %
EOS ABS: 0 10*3/uL (ref 0–0.7)
EOS PCT: 0 %
HCT: 40.2 % (ref 35.0–47.0)
Hemoglobin: 13.4 g/dL (ref 12.0–16.0)
LYMPHS ABS: 0.5 10*3/uL — AB (ref 1.0–3.6)
Lymphocytes Relative: 3 %
MCH: 28.6 pg (ref 26.0–34.0)
MCHC: 33.4 g/dL (ref 32.0–36.0)
MCV: 85.4 fL (ref 80.0–100.0)
Monocytes Absolute: 0.7 10*3/uL (ref 0.2–0.9)
Monocytes Relative: 5 %
Neutro Abs: 13.1 10*3/uL — ABNORMAL HIGH (ref 1.4–6.5)
Neutrophils Relative %: 92 %
PLATELETS: 235 10*3/uL (ref 150–440)
RBC: 4.7 MIL/uL (ref 3.80–5.20)
RDW: 14.5 % (ref 11.5–14.5)
WBC: 14.3 10*3/uL — AB (ref 3.6–11.0)

## 2017-04-29 LAB — COMPREHENSIVE METABOLIC PANEL
ALK PHOS: 35 U/L — AB (ref 38–126)
ALT: 20 U/L (ref 14–54)
AST: 27 U/L (ref 15–41)
Albumin: 4 g/dL (ref 3.5–5.0)
Anion gap: 10 (ref 5–15)
BILIRUBIN TOTAL: 0.7 mg/dL (ref 0.3–1.2)
BUN: 16 mg/dL (ref 6–20)
CHLORIDE: 105 mmol/L (ref 101–111)
CO2: 18 mmol/L — ABNORMAL LOW (ref 22–32)
CREATININE: 0.77 mg/dL (ref 0.44–1.00)
Calcium: 8.9 mg/dL (ref 8.9–10.3)
GFR calc Af Amer: >60 mL/min (ref >60)
Glucose, Bld: 183 mg/dL — ABNORMAL HIGH (ref 65–99)
Potassium: 3.6 mmol/L (ref 3.5–5.1)
Sodium: 133 mmol/L — ABNORMAL LOW (ref 135–145)
Total Protein: 7.3 g/dL (ref 6.5–8.1)

## 2017-04-29 LAB — LACTIC ACID, PLASMA: LACTIC ACID, VENOUS: 1.3 mmol/L (ref 0.5–1.9)

## 2017-04-29 LAB — TROPONIN I: Troponin I: <0.03 ng/mL (ref <0.03)

## 2017-04-29 LAB — INFLUENZA PANEL BY PCR (TYPE A & B)
INFLAPCR: POSITIVE — AB
Influenza B By PCR: NEGATIVE

## 2017-04-29 LAB — PROCALCITONIN: Procalcitonin: 0.11 ng/mL

## 2017-04-29 MED ORDER — ACETAMINOPHEN 325 MG PO TABS: 650.0000 mg | ORAL_TABLET | Freq: Four times a day (QID) | ORAL | Status: DC | PRN

## 2017-04-29 MED ORDER — MAGNESIUM OXIDE 400 (241.3 MG) MG PO TABS
400.0000 mg | ORAL_TABLET | Freq: Every day | ORAL | Status: DC
  Administered 2017-04-29 – 2017-05-01 (×3): 400 mg via ORAL
  Filled 2017-04-29 (×3): qty 1

## 2017-04-29 MED ORDER — OXYBUTYNIN CHLORIDE ER 5 MG PO TB24
10.0000 mg | ORAL_TABLET | Freq: Every day | ORAL | Status: DC
  Administered 2017-04-29 – 2017-04-30 (×2): 10 mg via ORAL
  Filled 2017-04-29 (×2): qty 2

## 2017-04-29 MED ORDER — ASPIRIN EC 81 MG PO TBEC
81.0000 mg | DELAYED_RELEASE_TABLET | Freq: Every day | ORAL | Status: DC
  Administered 2017-04-29 – 2017-05-01 (×3): 81 mg via ORAL
  Filled 2017-04-29 (×3): qty 1

## 2017-04-29 MED ORDER — METHYLPREDNISOLONE SODIUM SUCC 125 MG IJ SOLR
60.0000 mg | Freq: Four times a day (QID) | INTRAMUSCULAR | Status: DC
  Administered 2017-04-29 – 2017-04-30 (×2): 60 mg via INTRAVENOUS
  Filled 2017-04-29 (×3): qty 2

## 2017-04-29 MED ORDER — FERROUS SULFATE 325 (65 FE) MG PO TABS
325.0000 mg | ORAL_TABLET | Freq: Every day | ORAL | Status: DC
  Administered 2017-04-30 – 2017-05-01 (×2): 325 mg via ORAL
  Filled 2017-04-29 (×2): qty 1

## 2017-04-29 MED ORDER — METHYLPREDNISOLONE SODIUM SUCC 125 MG IJ SOLR
80.0000 mg | Freq: Once | INTRAMUSCULAR | Status: AC
  Administered 2017-04-29: 80 mg via INTRAVENOUS
  Filled 2017-04-29: qty 2

## 2017-04-29 MED ORDER — MESALAMINE ER 250 MG PO CPCR
1000.0000 mg | ORAL_CAPSULE | Freq: Four times a day (QID) | ORAL | Status: DC
  Administered 2017-04-29 – 2017-05-01 (×4): 1000 mg via ORAL
  Filled 2017-04-29 (×8): qty 4

## 2017-04-29 MED ORDER — CEFTRIAXONE SODIUM 1 G IJ SOLR
1.0000 g | Freq: Once | INTRAMUSCULAR | Status: AC
  Administered 2017-04-29: 1 g via INTRAVENOUS
  Filled 2017-04-29: qty 10

## 2017-04-29 MED ORDER — AZITHROMYCIN 500 MG PO TABS: 500.0000 mg | ORAL_TABLET | Freq: Every day | ORAL | Status: DC

## 2017-04-29 MED ORDER — SODIUM CHLORIDE 0.9% FLUSH: 3.0000 mL | INTRAVENOUS | Status: DC | PRN

## 2017-04-29 MED ORDER — IPRATROPIUM-ALBUTEROL 0.5-2.5 (3) MG/3ML IN SOLN
3.0000 mL | Freq: Four times a day (QID) | RESPIRATORY_TRACT | Status: DC
  Administered 2017-04-29 – 2017-04-30 (×5): 3 mL via RESPIRATORY_TRACT
  Filled 2017-04-29 (×6): qty 3

## 2017-04-29 MED ORDER — ACETAMINOPHEN 650 MG RE SUPP: 650.0000 mg | Freq: Four times a day (QID) | RECTAL | Status: DC | PRN

## 2017-04-29 MED ORDER — SODIUM CHLORIDE 0.9 % IV SOLN
500.0000 mg | INTRAVENOUS | Status: DC
  Filled 2017-04-29: qty 500

## 2017-04-29 MED ORDER — ALBUTEROL SULFATE (2.5 MG/3ML) 0.083% IN NEBU
2.5000 mg | INHALATION_SOLUTION | Freq: Once | RESPIRATORY_TRACT | Status: AC
  Administered 2017-04-29: 2.5 mg via RESPIRATORY_TRACT
  Filled 2017-04-29: qty 3

## 2017-04-29 MED ORDER — IPRATROPIUM-ALBUTEROL 0.5-2.5 (3) MG/3ML IN SOLN
3.0000 mL | Freq: Once | RESPIRATORY_TRACT | Status: AC
  Administered 2017-04-29: 3 mL via RESPIRATORY_TRACT
  Filled 2017-04-29: qty 3

## 2017-04-29 MED ORDER — ENOXAPARIN SODIUM 40 MG/0.4ML ~~LOC~~ SOLN
40.0000 mg | SUBCUTANEOUS | Status: DC
  Administered 2017-04-29 – 2017-04-30 (×2): 40 mg via SUBCUTANEOUS
  Filled 2017-04-29 (×2): qty 0.4

## 2017-04-29 MED ORDER — SODIUM CHLORIDE 0.9% FLUSH
3.0000 mL | Freq: Two times a day (BID) | INTRAVENOUS | Status: DC
  Administered 2017-04-29 – 2017-05-01 (×4): 3 mL via INTRAVENOUS

## 2017-04-29 MED ORDER — SODIUM CHLORIDE 0.9 % IV SOLN
500.0000 mg | Freq: Once | INTRAVENOUS | Status: AC
  Administered 2017-04-29: 500 mg via INTRAVENOUS
  Filled 2017-04-29: qty 500

## 2017-04-29 MED ORDER — SODIUM CHLORIDE 0.9 % IV SOLN: 250.0000 mL | INTRAVENOUS | Status: DC | PRN

## 2017-04-29 MED ORDER — AZITHROMYCIN 500 MG PO TABS: 250.0000 mg | ORAL_TABLET | Freq: Every day | ORAL | Status: DC

## 2017-04-29 MED ORDER — ONDANSETRON HCL 4 MG PO TABS: 4.0000 mg | ORAL_TABLET | Freq: Four times a day (QID) | ORAL | Status: DC | PRN

## 2017-04-29 MED ORDER — ONDANSETRON HCL 4 MG/2ML IJ SOLN: 4.0000 mg | Freq: Four times a day (QID) | INTRAMUSCULAR | Status: DC | PRN

## 2017-04-29 MED ORDER — SODIUM CHLORIDE 0.9 % IV SOLN
1.0000 g | INTRAVENOUS | Status: DC
  Filled 2017-04-29: qty 10

## 2017-04-29 MED ORDER — VITAMIN B-12 1000 MCG PO TABS
1000.0000 ug | ORAL_TABLET | Freq: Every day | ORAL | Status: DC
  Administered 2017-04-29 – 2017-05-01 (×3): 1000 ug via ORAL
  Filled 2017-04-29 (×3): qty 1

## 2017-04-29 MED ORDER — ADULT MULTIVITAMIN W/MINERALS CH
1.0000 | ORAL_TABLET | Freq: Every day | ORAL | Status: DC
  Administered 2017-04-29 – 2017-05-01 (×3): 1 via ORAL
  Filled 2017-04-29 (×3): qty 1

## 2017-04-29 MED ORDER — CALCIUM CARBONATE-VITAMIN D 500-200 MG-UNIT PO TABS
1.0000 | ORAL_TABLET | Freq: Every day | ORAL | Status: DC
  Administered 2017-04-29 – 2017-05-01 (×3): 1 via ORAL
  Filled 2017-04-29 (×3): qty 1

## 2017-04-29 MED ORDER — SODIUM CHLORIDE 0.9 % IV BOLUS (SEPSIS)
500.0000 mL | Freq: Once | INTRAVENOUS | Status: AC
  Administered 2017-04-29: 500 mL via INTRAVENOUS

## 2017-04-29 MED ORDER — PANTOPRAZOLE SODIUM 40 MG PO TBEC
40.0000 mg | DELAYED_RELEASE_TABLET | Freq: Every day | ORAL | Status: DC
  Administered 2017-04-29 – 2017-05-01 (×3): 40 mg via ORAL
  Filled 2017-04-29 (×3): qty 1

## 2017-04-29 MED ORDER — VITAMIN D 1000 UNITS PO TABS
1000.0000 [IU] | ORAL_TABLET | Freq: Every day | ORAL | Status: DC
  Administered 2017-04-29 – 2017-05-01 (×3): 1000 [IU] via ORAL
  Filled 2017-04-29 (×3): qty 1

## 2017-04-29 NOTE — H&P (Signed)
[facility] [place] NAME: Kimberly Andrade    MR#:  [id number]  DATE OF BIRTH:  Feb 03, 1946  DATE OF ADMISSION:  04/29/2017  PRIMARY CARE PHYSICIAN: Glendon Axe, MD   REQUESTING/REFERRING PHYSICIAN:   CHIEF COMPLAINT:   Chief Complaint  Patient presents with  . Fever  . Cough    HISTORY OF PRESENT ILLNESS: Kimberly Andrade  is a 72 y.o. female with a known history of Crohn's disease, degenerative joint disease, fibromyalgia, GERD, hyperlipidemia, osteoporosis, sleep apnea presented to the emergency room with cough and difficulty breathing.  Patient had some wheezing for the last 2 days.  No history of any COPD.  Quit smoking 17 years ago.  Patient states she also had some chills and low-grade fever.  Patient was given nebulization treatment in the emergency room and was put on a via nasal cannula because her saturation was less than 90.  Her saturation improved but still she was wheezing.  No evidence of any pneumonia on the chest x-ray.  Hospitalist service was consulted.  PAST MEDICAL HISTORY:   Past Medical History:  Diagnosis Date  . Arthritis   . Crohn disease ([place])   . Depression   . DJD (degenerative joint disease)   . Fibromyalgia   . Full dentures   . GERD (gastroesophageal reflux disease)   . Hypercholesterolemia   . Osteoporosis   . Reflux   . Sleep apnea    uses a cpap  . Wears glasses     PAST SURGICAL HISTORY:  Past Surgical History:  Procedure Laterality Date  . ABDOMINAL HYSTERECTOMY    . APPENDECTOMY    . CARPEL TUNNEL    . CHOLECYSTECTOMY    . COLONOSCOPY    . COLONOSCOPY WITH PROPOFOL N/A 03/11/2016   Procedure: COLONOSCOPY WITH PROPOFOL;  Surgeon: Lollie Sails, MD;  Location: [facility] ENDOSCOPY;  Service: Endoscopy;  Laterality: N/A;  . DILATION AND CURETTAGE OF UTERUS    . ESOPHAGOGASTRODUODENOSCOPY N/A 03/11/2016   Procedure: ESOPHAGOGASTRODUODENOSCOPY (EGD);  Surgeon: Lollie Sails,  MD;  [facility] END[facility]COPY;  Service: Endoscopy;  Laterality: N/A;  . FOOT SURGERY Right   . GALLBLADDER SURGERY    . KNEE ARTHROSCOPY Right 05/25/2013   Procedure: ARTHROSCOPY KNEE;  Surgeon: Hessie Dibble, MD;  Location: [place];  Service: Orthopedics;  Laterality: Right;  medial and lateral meniscal debridment and chondroplasty  . repair of rotator cuff    . SHOULDER SURGERY Right    X 3  . UPPER GI ENDOSCOPY      SOCIAL HISTORY:  Social History   Tobacco Use  . Smoking status: Former Smoker    Last attempt to quit: 12/29/2001    Years since quitting: 15.3  . Smokeless tobacco: Never Used  Substance Use Topics  . Alcohol use: No    FAMILY HISTORY:  Family History  Problem Relation Age of Onset  . Heart disease Mother   . Esophageal cancer Father     DRUG ALLERGIES:  Allergies  Allergen Reactions  . Codeine Other (See Comments)    gastritis  . Fosamax [Alendronate Sodium] Other (See Comments)    Bones hurt  . Hydrocodone Nausea And Vomiting  . Lipitor [Atorvastatin] Other (See Comments)    pain    REVIEW OF SYSTEMS:   CONSTITUTIONAL: Had fever, fatigue and weakness.  EYES: No blurred or double vision.  EARS, NOSE, AND THROAT: No tinnitus or ear pain.  RESPIRATORY: Has cough,  shortness of breath, wheezing  no hemoptysis.  CARDIOVASCULAR: No chest pain, orthopnea, edema.  GASTROINTESTINAL: No nausea, vomiting, diarrhea or abdominal pain.  GENITOURINARY: No dysuria, hematuria.  ENDOCRINE: No polyuria, nocturia,  HEMATOLOGY: No anemia, easy bruising or bleeding SKIN: No rash or lesion. MUSCULOSKELETAL: No joint pain or arthritis.   NEUROLOGIC: No tingling, numbness, weakness.  PSYCHIATRY: No anxiety or depression.   MEDICATIONS AT HOME:  Prior to Admission medications   Medication Sig Start Date End Date Taking? Authorizing Provider  aspirin EC 81 MG tablet Take 81 mg by mouth daily.   Yes [provider]  Calcium 600-200  MG-UNIT tablet Take 1 tablet by mouth daily.   Yes [provider]  cholecalciferol (VITAMIN D) 1000 units tablet Take 1,000 Units by mouth daily.   Yes [provider]  ferrous sulfate 325 (65 FE) MG tablet Take 325 mg by mouth daily with breakfast.   Yes [provider]  Garlic 2426 MG CAPS Take 1,000 mg by mouth daily.    Yes [provider]  Magnesium 250 MG TABS Take 250 mg by mouth daily.    Yes [provider]  mesalamine (PENTASA) 500 MG CR capsule Take 1,000 mg by mouth 4 (four) times daily.    Yes [provider]  Multiple Vitamins-Minerals (MULTIVITAMIN WITH MINERALS) tablet Take 1 tablet by mouth daily.   Yes [provider]  omeprazole (PRILOSEC) 40 MG capsule Take 40 mg by mouth daily.   Yes [provider]  oxybutynin (DITROPAN-XL) 10 MG 24 hr tablet Take 10 mg by mouth at bedtime.   Yes [provider]  Potassium 99 MG TABS Take 99 mg by mouth daily.    Yes [provider]  vitamin B-12 (CYANOCOBALAMIN) 1000 MCG tablet Take 1,000 mcg by mouth daily.   Yes [provider]      PHYSICAL EXAMINATION:   VITAL SIGNS: Blood pressure 111/76, pulse 89, temperature 99.5 F (37.5 C), temperature source Oral, resp. rate 20, height 5' 2"  (1.575 m), weight 74.4 kg (164 lb), SpO2 91 %.  GENERAL:  72 y.o.-year-old patient lying in the bed with no acute distress.  EYES: Pupils equal, round, reactive to light and accommodation. No scleral icterus. Extraocular muscles intact.  HEENT: Head atraumatic, normocephalic. Oropharynx and nasopharynx clear.  NECK:  Supple, no jugular venous distention. No thyroid enlargement, no tenderness.  LUNGS: Decreased breath sounds bilaterally, bilateral wheezing, rales. No use of accessory muscles of respiration.  CARDIOVASCULAR: S1, S2 normal. No murmurs, rubs, or gallops.  ABDOMEN: Soft, nontender, nondistended. Bowel sounds present. No organomegaly or mass.   EXTREMITIES: No pedal edema, cyanosis, or clubbing.  NEUROLOGIC: Cranial nerves II through XII are intact. Muscle strength 5/5 in all extremities. Sensation intact. Gait not checked.  PSYCHIATRIC: The patient is alert and oriented x 3.  SKIN: No obvious rash, lesion, or ulcer.   LABORATORY PANEL:   CBC Recent Labs  Lab 04/29/17 1309  WBC 14.3*  HGB 13.4  HCT 40.2  PLT 235  MCV 85.4  MCH 28.6  MCHC 33.4  RDW 14.5  LYMPHSABS 0.5*  MONOABS 0.7  EOSABS 0.0  BASOSABS 0.0   ------------------------------------------------------------------------------------------------------------------  Chemistries  Recent Labs  Lab 04/29/17 1309  NA 133*  K 3.6  CL 105  CO2 18*  GLUCOSE 183*  BUN 16  CREATININE 0.77  CALCIUM 8.9  AST 27  ALT 20  ALKPHOS 35*  BILITOT 0.7   ------------------------------------------------------------------------------------------------------------------ estimated creatinine clearance is 60.9 mL/min (  by C-G formula based on SCr of 0.77 mg/dL). ------------------------------------------------------------------------------------------------------------------ No results for input(s): TSH, T4TOTAL, T3FREE, THYROIDAB in the last 72 hours.  Invalid input(s): FREET3   Coagulation profile No results for input(s): INR, PROTIME in the last 168 hours. ------------------------------------------------------------------------------------------------------------------- No results for input(s): DDIMER in the last 72 hours. -------------------------------------------------------------------------------------------------------------------  Cardiac Enzymes Recent Labs  Lab 04/29/17 1309  TROPONINI <0.03   ------------------------------------------------------------------------------------------------------------------ Invalid input(s):  POCBNP  ---------------------------------------------------------------------------------------------------------------  Urinalysis No results found for: COLORURINE, APPEARANCEUR, LABSPEC, PHURINE, GLUCOSEU, HGBUR, BILIRUBINUR, KETONESUR, PROTEINUR, UROBILINOGEN, NITRITE, LEUKOCYTESUR   RADIOLOGY: Dg Chest 2 View  Result Date: 04/29/2017 CLINICAL DATA:  Cough and fever for several days, some lightheadedness and chest soreness EXAM: CHEST  2 VIEW COMPARISON:  Chest x-ray of 10/02/2010 FINDINGS: No pneumonia or effusion is seen. There is mild basilar atelectasis present. There is peribronchial thickening present which may indicate bronchitis in this patient with a smoking history. Mediastinal and hilar contours are unremarkable. The heart is mildly enlarged and stable. Right shoulder prosthesis is noted. There is a moderate size hiatal hernia present. No bony abnormality is seen. IMPRESSION: 1. No pneumonia. Probable bronchitis. Bibasilar linear atelectasis or scarring. 2. Moderate-sized hiatal hernia. Electronically Signed   By: Ivar Drape [name]   On: 04/29/2017 13:39    EKG: Orders placed or performed during the hospital encounter of 04/29/17  . ED EKG  . ED EKG    IMPRESSION AND PLAN: 72 year old female patient with history of for degenerative joint disease, hyperlipidemia, Crohn's disease, fibromyalgia, GERD presented to the emergency room with wheezing, shortness of breath.  Admitting diagnosis 1.  Acute bronchitis 2.  Hypoxia 3.  Acute bronchospasm 4.  Hyperlipidemia 5.  Crohn's disease Treatment plan Admit patient to medical floor observation bed IV Solu-Medrol 60 mg every 6 hourly Nebulization treatments Oral Zithromax antibiotic Wean oxygen  CPAP at bedtime All the records are reviewed and case discussed with ED provider. Management plans discussed with the patient, family and they are in agreement.  CODE STATUS:FULL CODE Code Status History    This patient does not  have a recorded code status. Please follow your organizational policy for patients in this situation.       TOTAL TIME TAKING CARE OF THIS PATIENT: 50 minutes.    Saundra Shelling M.D on 04/29/2017 at 7:26 PM  Between 7am to 6pm - Pager - [phone]  After 6pm go to [email or web address] - password [email or web address] [facility]  [place] [facility]  Office  [phone]  CC: Primary care physician; Glendon Axe, MD

## 2017-04-29 NOTE — ED Provider Notes (Signed)
[facility] Emergency Department Provider Note    None    (approximate)  I have reviewed the triage vital signs and the nursing notes.   HISTORY  Chief Complaint4 Fever and Cough    HPI BRECK HOLLINGER is a 72 y.o. female who has a remote history of bronchitis and asthma presents with chief complaint of nonproductive cough chills shortness of breath since Friday.  Has had decreased appetite.  Did get her flu out this year.  Denies any lower extremity swelling.  No chest pains.  Simply feels fatigued and weak when she is walking.  Is not been on any antibiotics.  Does not take nebulizers at home or any recent steroids.  Past Medical History:  Diagnosis Date  . Arthritis   . Crohn disease ([place])   . Depression   . DJD (degenerative joint disease)   . Fibromyalgia   . Full dentures   . GERD (gastroesophageal reflux disease)   . Hypercholesterolemia   . Osteoporosis   . Reflux   . Sleep apnea    uses a cpap  . Wears glasses    Family History  Problem Relation Age of Onset  . Heart disease Mother   . Esophageal cancer Father    Past Surgical History:  Procedure Laterality Date  . ABDOMINAL HYSTERECTOMY    . APPENDECTOMY    . CARPEL TUNNEL    . CHOLECYSTECTOMY    . COLONOSCOPY    . COLONOSCOPY WITH PROPOFOL N/A 03/11/2016   Procedure: COLONOSCOPY WITH PROPOFOL;  Surgeon: Lollie Sails, MD;  Location: [facility] ENDOSCOPY;  Service: Endoscopy;  Laterality: N/A;  . DILATION AND CURETTAGE OF UTERUS    . ESOPHAGOGASTRODUODENOSCOPY N/A 03/11/2016   Procedure: ESOPHAGOGASTRODUODENOSCOPY (EGD);  Surgeon: Lollie Sails, MD;  Location: [facility] ENDOSCOPY;  Service: Endoscopy;  Laterality: N/A;  . FOOT SURGERY Right   . GALLBLADDER SURGERY    . KNEE ARTHROSCOPY Right 05/25/2013   Procedure: ARTHROSCOPY KNEE;  Surgeon: Hessie Dibble, MD;  Location: [place];  Service: Orthopedics;  Laterality: Right;  medial and lateral meniscal  debridment and chondroplasty  . repair of rotator cuff    . SHOULDER SURGERY Right    X 3  . UPPER GI ENDOSCOPY     Patient Active Problem List   Diagnosis Date Noted  . Hypoxia 04/29/2017      Prior to Admission medications   Medication Sig Start Date End Date Taking? Authorizing Provider  aspirin EC 81 MG tablet Take 81 mg by mouth daily.   Yes [provider]  Calcium 600-200 MG-UNIT tablet Take 1 tablet by mouth daily.   Yes [provider]  cholecalciferol (VITAMIN D) 1000 units tablet Take 1,000 Units by mouth daily.   Yes [provider]  ferrous sulfate 325 (65 FE) MG tablet Take 325 mg by mouth daily with breakfast.   Yes [provider]  Garlic 4098 MG CAPS Take 1,000 mg by mouth daily.    Yes [provider]  Magnesium 250 MG TABS Take 250 mg by mouth daily.    Yes [provider]  mesalamine (PENTASA) 500 MG CR capsule Take 1,000 mg by mouth 4 (four) times daily.    Yes [provider]  Multiple Vitamins-Minerals (MULTIVITAMIN WITH MINERALS) tablet Take 1 tablet by mouth daily.   Yes [provider]  omeprazole (PRILOSEC) 40 MG capsule Take 40 mg by mouth daily.   Yes [provider]  oxybutynin (  DITROPAN-XL) 10 MG 24 hr tablet Take 10 mg by mouth at bedtime.   Yes [provider]  Potassium 99 MG TABS Take 99 mg by mouth daily.    Yes [provider]  vitamin B-12 (CYANOCOBALAMIN) 1000 MCG tablet Take 1,000 mcg by mouth daily.   Yes [provider]    Allergies Codeine; Fosamax [alendronate sodium]; Hydrocodone; and Lipitor [atorvastatin]    Social History Social History   Tobacco Use  . Smoking status: Former Smoker    Last attempt to quit: 12/29/2001    Years since quitting: 15.3  . Smokeless tobacco: Never Used  Substance Use Topics  . Alcohol use: No  . Drug use: No    Review of Systems Patient denies headaches, rhinorrhea, blurry vision,  numbness, shortness of breath, chest pain, edema, cough, abdominal pain, nausea, vomiting, diarrhea, dysuria, fevers, rashes or hallucinations unless otherwise stated above in HPI. ____________________________________________   PHYSICAL EXAM:  VITAL SIGNS: Vitals:   04/29/17 1830 04/29/17 1930  BP: 111/76 117/61  Pulse: 89 80  Resp:    Temp:    SpO2: 91% 90%    Constitutional: Alert and oriented. Well appearing and in no acute distress. Eyes: Conjunctivae are normal.  Head: Atraumatic. Nose: No congestion/rhinnorhea. Mouth/Throat: Mucous membranes are moist.   Neck: No stridor. Painless ROM.  Cardiovascular: Normal rate, regular rhythm. Grossly normal heart sounds.  Good peripheral circulation. Respiratory: Normal respiratory effort.  No retractions. Lungs with coarse breathsounds throughout Gastrointestinal: Soft and nontender. No distention. No abdominal bruits. No CVA tenderness. Genitourinary:  Musculoskeletal: No lower extremity tenderness nor edema.  No joint effusions. Neurologic:  Normal speech and language. No gross focal neurologic deficits are appreciated. No facial droop Skin:  Skin is warm, dry and intact. No rash noted. Psychiatric: Mood and affect are normal. Speech and behavior are normal.  ____________________________________________   LABS (all labs ordered are listed, but only abnormal results are displayed)  Results for orders placed or performed during the hospital encounter of 04/29/17 (from the past 24 hour(s))  Comprehensive metabolic panel     Status: Abnormal   Collection Time: 04/29/17  1:09 PM  Result Value Ref Range   Sodium 133 (L) 135 - 145 mmol/L   Potassium 3.6 3.5 - 5.1 mmol/L   Chloride 105 101 - 111 mmol/L   CO2 18 (L) 22 - 32 mmol/L   Glucose, Bld 183 (H) 65 - 99 mg/dL   BUN 16 6 - 20 mg/dL   Creatinine, Ser 0.77 0.44 - 1.00 mg/dL   Calcium 8.9 8.9 - 10.3 mg/dL   Total Protein 7.3 6.5 - 8.1 g/dL   Albumin 4.0 3.5 - 5.0 g/dL   AST  27 15 - 41 U/L   ALT 20 14 - 54 U/L   Alkaline Phosphatase 35 (L) 38 - 126 U/L   Total Bilirubin 0.7 0.3 - 1.2 mg/dL   GFR calc non Af Amer >60 >60 mL/min   GFR calc Af Amer >60 >60 mL/min   Anion gap 10 5 - 15  CBC with Differential     Status: Abnormal   Collection Time: 04/29/17  1:09 PM  Result Value Ref Range   WBC 14.3 (H) 3.6 - 11.0 K/uL   RBC 4.70 3.80 - 5.20 MIL/uL   Hemoglobin 13.4 12.0 - 16.0 g/dL   HCT 40.2 35.0 - 47.0 %   MCV 85.4 80.0 - 100.0 fL   MCH 28.6 26.0 - 34.0 pg   MCHC 33.4 32.0 -  36.0 g/dL   RDW 14.5 11.5 - 14.5 %   Platelets 235 150 - 440 K/uL   Neutrophils Relative % 92 %   Neutro Abs 13.1 (H) 1.4 - 6.5 K/uL   Lymphocytes Relative 3 %   Lymphs Abs 0.5 (L) 1.0 - 3.6 K/uL   Monocytes Relative 5 %   Monocytes Absolute 0.7 0.2 - 0.9 K/uL   Eosinophils Relative 0 %   Eosinophils Absolute 0.0 0 - 0.7 K/uL   Basophils Relative 0 %   Basophils Absolute 0.0 0 - 0.1 K/uL  Troponin I     Status: None   Collection Time: 04/29/17  1:09 PM  Result Value Ref Range   Troponin I <0.03 <0.03 ng/mL  Procalcitonin     Status: None   Collection Time: 04/29/17  1:09 PM  Result Value Ref Range   Procalcitonin 0.11 ng/mL  Lactic acid, plasma     Status: None   Collection Time: 04/29/17  1:10 PM  Result Value Ref Range   Lactic Acid, Venous 1.3 0.5 - 1.9 mmol/L  Influenza panel by PCR (type A & B)     Status: Abnormal   Collection Time: 04/29/17  6:00 PM  Result Value Ref Range   Influenza A By PCR POSITIVE (A) NEGATIVE   Influenza B By PCR NEGATIVE NEGATIVE   ____________________________________________  EKG My review and personal interpretation at Time: 18:57   Indication: sob  Rate: 85  Rhythm: sinus Axis: normal Other: nonspeciific st changes, no stemi ____________________________________________  RADIOLOGY  I personally reviewed all radiographic images ordered to evaluate for the above acute complaints and reviewed radiology reports and findings.  These  findings were personally discussed with the patient.  Please see medical record for radiology report.  ____________________________________________   PROCEDURES  Procedure(s) performed:  .Critical Care Performed by: Merlyn Lot, MD Authorized by: Merlyn Lot, MD   Critical care provider statement:    Critical care time (minutes):  30   Critical care time was exclusive of:  Separately billable procedures and treating other patients   Critical care was necessary to treat or prevent imminent or life-threatening deterioration of the following conditions:  Respiratory failure   Critical care was time spent personally by me on the following activities:  Development of treatment plan with patient or surrogate, discussions with consultants, evaluation of patient's response to treatment, examination of patient, obtaining history from patient or surrogate, ordering and performing treatments and interventions, ordering and review of laboratory studies, ordering and review of radiographic studies, pulse oximetry, re-evaluation of patient's condition and review of old charts       Critical Care performed: yes  ____________________________________________   INITIAL IMPRESSION / [place] / ED COURSE  Pertinent labs & imaging results that were available during my care of the patient were reviewed by me and considered in my medical decision making (see chart for details).  DDX: Asthma, copd, CHF, pna, ptx, malignancy, Pe, anemia   DNIYA NEUHAUS is a 72 y.o. who presents to the ED with sob and moderate respiratory distress.  Patient is AFVSS in ED. Exam as above. Given current presentation have considered the above differential.  Seems most clinically consistent with COPD exacerbation possible pneumonia given her fever.  Chest x-ray shows no infiltrate or edema.  Will give nebulizer as well as steroids and reassess.  Patient is requiring supplemental oxygen at this  time.  Will reassess to evaluate if able to wean from oxygen.   Clinical  Course as of Apr 29 1953  Tue Apr 29, 2017  1820 Patient with persistent hypoxia down to the low 80s requiring supplemental oxygen despite multiple nebulizers steroids.  Currently awaiting flu.  Most likely viral the patient given dose of antibiotics given acute respiratory distress and hypoxia with fever.  Have discussed with the patient and available family all diagnostics and treatments performed thus far and all questions were answered to the best of my ability. The patient demonstrates understanding and agreement with plan.   [PR]    Clinical Course User Index [PR] Merlyn Lot, MD     ____________________________________________   FINAL CLINICAL IMPRESSION(S) / ED DIAGNOSES  Final diagnoses:  Acute respiratory failure with hypoxia ([place])  Bronchitis      NEW MEDICATIONS STARTED DURING THIS VISIT:  New Prescriptions   No medications on file     Note:  This document was prepared using Dragon voice recognition software and may include unintentional dictation errors.    Merlyn Lot, MD 04/29/17 Karl Bales

## 2017-04-29 NOTE — ED Notes (Addendum)
Patient transported to room 157 by this EDT.

## 2017-04-29 NOTE — ED Notes (Signed)
First Nurse Note:  Patient here from [facility].  Pulse ox 91% on room air, RR - 20.

## 2017-04-29 NOTE — ED Notes (Signed)
Patients O2 Stats while ambulating were 90% RA.

## 2017-04-29 NOTE — ED Triage Notes (Signed)
Says cough since Friday.  Feels lightheaded.

## 2017-04-29 NOTE — ED Notes (Signed)
First Nurse Note:  VS repeated.  Pulse ox - 90% on room air.  Patient lying in recliner in sub-waiting room.  Wait explained to patient, she verbalizes understanding.

## 2017-04-29 NOTE — Progress Notes (Signed)
Pharmacy Antibiotic Note  Kimberly Andrade is a 72 y.o. female admitted on 04/29/2017 with pneumonia.  Pharmacy has been consulted for ceftriaxone/azithromycin dosing.  Plan: azithromycin iv 500mg  q24h and ceftriaxone 1gm iv q24h   Height: 5\' 2"  (157.5 cm) Weight: 164 lb (74.4 kg) IBW/kg (Calculated) : 50.1  Temp (24hrs), Avg:101.6 F (38.7 C), Min:101.6 F (38.7 C), Max:101.6 F (38.7 C)  Recent Labs  Lab 04/29/17 1309 04/29/17 1310  WBC 14.3*  --   CREATININE 0.77  --   LATICACIDVEN  --  1.3    Estimated Creatinine Clearance: 60.9 mL/min (by C-G formula based on SCr of 0.77 mg/dL).    Allergies  Allergen Reactions  . Codeine Other (See Comments)    gastritis  . Fosamax [Alendronate Sodium] Other (See Comments)    Bones hurt  . Hydrocodone Nausea And Vomiting  . Lipitor [Atorvastatin] Other (See Comments)    pain    Antimicrobials this admission: Anti-infectives (From admission, onward)   Start     Dose/Rate Route Frequency Ordered Stop   04/30/17 1700  cefTRIAXone (ROCEPHIN) 1 g in sodium chloride 0.9 % 100 mL IVPB     1 g 200 mL/hr over 30 Minutes Intravenous Every 24 hours 04/29/17 1722     04/30/17 1000  azithromycin (ZITHROMAX) 500 mg in sodium chloride 0.9 % 250 mL IVPB     500 mg 250 mL/hr over 60 Minutes Intravenous Every 24 hours 04/29/17 1722     04/29/17 1730  cefTRIAXone (ROCEPHIN) 1 g in sodium chloride 0.9 % 100 mL IVPB     1 g 200 mL/hr over 30 Minutes Intravenous  Once 04/29/17 1719     04/29/17 1730  azithromycin (ZITHROMAX) 500 mg in sodium chloride 0.9 % 250 mL IVPB     500 mg 250 mL/hr over 60 Minutes Intravenous  Once 04/29/17 1719         Microbiology results: No results found for this or any previous visit (from the past 240 hour(s)).   Thank you for allowing pharmacy to be a part of this patient's care.  Donna Christen Lawanda Holzheimer 04/29/2017 5:22 PM

## 2017-04-29 NOTE — ED Notes (Signed)
Assisted pt to restroom but unable to collect a urine sample at this time. Will try again later.

## 2017-04-30 ENCOUNTER — Other Ambulatory Visit: Payer: Self-pay

## 2017-04-30 DIAGNOSIS — Z9049 Acquired absence of other specified parts of digestive tract: Secondary | ICD-10-CM | POA: Diagnosis not present

## 2017-04-30 DIAGNOSIS — Z972 Presence of dental prosthetic device (complete) (partial): Secondary | ICD-10-CM | POA: Diagnosis not present

## 2017-04-30 DIAGNOSIS — Z973 Presence of spectacles and contact lenses: Secondary | ICD-10-CM | POA: Diagnosis not present

## 2017-04-30 DIAGNOSIS — M810 Age-related osteoporosis without current pathological fracture: Secondary | ICD-10-CM | POA: Diagnosis present

## 2017-04-30 DIAGNOSIS — K219 Gastro-esophageal reflux disease without esophagitis: Secondary | ICD-10-CM | POA: Diagnosis present

## 2017-04-30 DIAGNOSIS — Z7982 Long term (current) use of aspirin: Secondary | ICD-10-CM | POA: Diagnosis not present

## 2017-04-30 DIAGNOSIS — J9601 Acute respiratory failure with hypoxia: Secondary | ICD-10-CM | POA: Diagnosis present

## 2017-04-30 DIAGNOSIS — K5090 Crohn's disease, unspecified, without complications: Secondary | ICD-10-CM | POA: Diagnosis present

## 2017-04-30 DIAGNOSIS — J40 Bronchitis, not specified as acute or chronic: Secondary | ICD-10-CM | POA: Diagnosis present

## 2017-04-30 DIAGNOSIS — Z888 Allergy status to other drugs, medicaments and biological substances status: Secondary | ICD-10-CM | POA: Diagnosis not present

## 2017-04-30 DIAGNOSIS — F329 Major depressive disorder, single episode, unspecified: Secondary | ICD-10-CM | POA: Diagnosis present

## 2017-04-30 DIAGNOSIS — Z8249 Family history of ischemic heart disease and other diseases of the circulatory system: Secondary | ICD-10-CM | POA: Diagnosis not present

## 2017-04-30 DIAGNOSIS — M1990 Unspecified osteoarthritis, unspecified site: Secondary | ICD-10-CM | POA: Diagnosis present

## 2017-04-30 DIAGNOSIS — E785 Hyperlipidemia, unspecified: Secondary | ICD-10-CM | POA: Diagnosis present

## 2017-04-30 DIAGNOSIS — J101 Influenza due to other identified influenza virus with other respiratory manifestations: Secondary | ICD-10-CM | POA: Diagnosis present

## 2017-04-30 DIAGNOSIS — I471 Supraventricular tachycardia: Secondary | ICD-10-CM | POA: Diagnosis not present

## 2017-04-30 DIAGNOSIS — E7800 Pure hypercholesterolemia, unspecified: Secondary | ICD-10-CM | POA: Diagnosis present

## 2017-04-30 DIAGNOSIS — A4189 Other specified sepsis: Secondary | ICD-10-CM | POA: Diagnosis present

## 2017-04-30 DIAGNOSIS — Z885 Allergy status to narcotic agent status: Secondary | ICD-10-CM | POA: Diagnosis not present

## 2017-04-30 DIAGNOSIS — J9811 Atelectasis: Secondary | ICD-10-CM | POA: Diagnosis present

## 2017-04-30 DIAGNOSIS — M797 Fibromyalgia: Secondary | ICD-10-CM | POA: Diagnosis present

## 2017-04-30 DIAGNOSIS — Z87891 Personal history of nicotine dependence: Secondary | ICD-10-CM | POA: Diagnosis not present

## 2017-04-30 DIAGNOSIS — G4733 Obstructive sleep apnea (adult) (pediatric): Secondary | ICD-10-CM | POA: Diagnosis present

## 2017-04-30 DIAGNOSIS — Z800 Family history of malignant neoplasm of digestive organs: Secondary | ICD-10-CM | POA: Diagnosis not present

## 2017-04-30 DIAGNOSIS — A419 Sepsis, unspecified organism: Secondary | ICD-10-CM | POA: Diagnosis present

## 2017-04-30 DIAGNOSIS — Z90710 Acquired absence of both cervix and uterus: Secondary | ICD-10-CM | POA: Diagnosis not present

## 2017-04-30 LAB — CBC
HEMATOCRIT: 39.8 % (ref 35.0–47.0)
HEMOGLOBIN: 13.1 g/dL (ref 12.0–16.0)
MCH: 28.2 pg (ref 26.0–34.0)
MCHC: 33 g/dL (ref 32.0–36.0)
MCV: 85.3 fL (ref 80.0–100.0)
Platelets: 226 10*3/uL (ref 150–440)
RBC: 4.67 MIL/uL (ref 3.80–5.20)
RDW: 14.8 % — ABNORMAL HIGH (ref 11.5–14.5)
WBC: 12.1 10*3/uL — ABNORMAL HIGH (ref 3.6–11.0)

## 2017-04-30 LAB — URINALYSIS, COMPLETE (UACMP) WITH MICROSCOPIC
BACTERIA UA: NONE SEEN
Bilirubin Urine: NEGATIVE
GLUCOSE, UA: NEGATIVE mg/dL
KETONES UR: NEGATIVE mg/dL
Leukocytes, UA: NEGATIVE
Nitrite: NEGATIVE
PROTEIN: NEGATIVE mg/dL
Specific Gravity, Urine: 1.015 (ref 1.005–1.030)
pH: 6 (ref 5.0–8.0)

## 2017-04-30 LAB — MAGNESIUM: Magnesium: 2.1 mg/dL (ref 1.7–2.4)

## 2017-04-30 LAB — BASIC METABOLIC PANEL
ANION GAP: 8 (ref 5–15)
BUN: 15 mg/dL (ref 6–20)
CO2: 23 mmol/L (ref 22–32)
Calcium: 8.9 mg/dL (ref 8.9–10.3)
Chloride: 108 mmol/L (ref 101–111)
Creatinine, Ser: 0.74 mg/dL (ref 0.44–1.00)
GFR calc Af Amer: >60 mL/min (ref >60)
GFR calc non Af Amer: >60 mL/min (ref >60)
GLUCOSE: 195 mg/dL — AB (ref 65–99)
POTASSIUM: 3.9 mmol/L (ref 3.5–5.1)
Sodium: 139 mmol/L (ref 135–145)

## 2017-04-30 MED ORDER — IPRATROPIUM-ALBUTEROL 0.5-2.5 (3) MG/3ML IN SOLN
3.0000 mL | Freq: Three times a day (TID) | RESPIRATORY_TRACT | Status: DC
  Administered 2017-05-01: 3 mL via RESPIRATORY_TRACT
  Filled 2017-04-30 (×2): qty 3

## 2017-04-30 MED ORDER — OSELTAMIVIR PHOSPHATE 75 MG PO CAPS
75.0000 mg | ORAL_CAPSULE | Freq: Two times a day (BID) | ORAL | Status: DC
  Administered 2017-04-30 – 2017-05-01 (×3): 75 mg via ORAL
  Filled 2017-04-30 (×3): qty 1

## 2017-04-30 NOTE — Progress Notes (Signed)
[facility] [place] NAME: Artesha Wemhoff    MR#:  [id number]  DATE OF BIRTH:  Aug 10, 1945  SUBJECTIVE:  Patient here with SOB and cough weakness SVT noted HR better this am REVIEW OF SYSTEMS:    Review of Systems  Constitutional: Negative for fever, chills weight loss Positive for generalized weakness HENT: Negative for ear pain, nosebleeds, congestion, facial swelling, rhinorrhea, neck pain, neck stiffness and ear discharge.   Respiratory: Negative for cough, shortness of breath, wheezing  Cardiovascular: Negative for chest pain, palpitations and leg swelling.  Gastrointestinal: Negative for heartburn, abdominal pain, vomiting, diarrhea or consitpation Genitourinary: Negative for dysuria, urgency, frequency, hematuria Musculoskeletal: Negative for back pain or joint pain Neurological: Negative for dizziness, seizures, syncope, focal weakness,  numbness and headaches.  Hematological: Does not bruise/bleed easily.  Psychiatric/Behavioral: Negative for hallucinations, confusion, dysphoric mood    Tolerating Diet:yes      DRUG ALLERGIES:   Allergies  Allergen Reactions  . Codeine Other (See Comments)    gastritis  . Fosamax [Alendronate Sodium] Other (See Comments)    Bones hurt  . Hydrocodone Nausea And Vomiting  . Lipitor [Atorvastatin] Other (See Comments)    pain    VITALS:  Blood pressure 121/63, pulse 70, temperature 97.7 F (36.5 C), temperature source Oral, resp. rate 20, height 5' 2"  (1.575 m), weight 74.1 kg (163 lb 4.8 oz), SpO2 95 %.  PHYSICAL EXAMINATION:  Constitutional: Appears well-developed and well-nourished. No distress. HENT: Normocephalic. Marland Kitchen Oropharynx is clear and moist.  Eyes: Conjunctivae and EOM are normal. PERRLA, no scleral icterus.  Neck: Normal ROM. Neck supple. No JVD. No tracheal deviation. CVS: RRR, S1/S2 +, no murmurs, no gallops, no carotid bruit.  Pulmonary: Effort and breath sounds  normal, no stridor, rhonchi, wheezes, rales.  Abdominal: Soft. BS +,  no distension, tenderness, rebound or guarding.  Musculoskeletal: Normal range of motion. No edema and no tenderness.  Neuro: Alert. CN 2-12 grossly intact. No focal deficits. Skin: Skin is warm and dry. No rash noted. Psychiatric: Normal mood and affect.      LABORATORY PANEL:   CBC Recent Labs  Lab 04/29/17 1309  WBC 14.3*  HGB 13.4  HCT 40.2  PLT 235   ------------------------------------------------------------------------------------------------------------------  Chemistries  Recent Labs  Lab 04/29/17 1309 04/30/17 0247  NA 133* 139  K 3.6 3.9  CL 105 108  CO2 18* 23  GLUCOSE 183* 195*  BUN 16 15  CREATININE 0.77 0.74  CALCIUM 8.9 8.9  MG  --  2.1  AST 27  --   ALT 20  --   ALKPHOS 35*  --   BILITOT 0.7  --    ------------------------------------------------------------------------------------------------------------------  Cardiac Enzymes Recent Labs  Lab 04/29/17 1309  TROPONINI <0.03   ------------------------------------------------------------------------------------------------------------------  RADIOLOGY:  Dg Chest 2 View  Result Date: 04/29/2017 CLINICAL DATA:  Cough and fever for several days, some lightheadedness and chest soreness EXAM: CHEST  2 VIEW COMPARISON:  Chest x-ray of 10/02/2010 FINDINGS: No pneumonia or effusion is seen. There is mild basilar atelectasis present. There is peribronchial thickening present which may indicate bronchitis in this patient with a smoking history. Mediastinal and hilar contours are unremarkable. The heart is mildly enlarged and stable. Right shoulder prosthesis is noted. There is a moderate size hiatal hernia present. No bony abnormality is seen. IMPRESSION: 1. No pneumonia. Probable bronchitis. Bibasilar linear atelectasis or scarring. 2. Moderate-sized hiatal hernia. Electronically Signed   By: Windy Canny.D.  On: 04/29/2017 13:39      ASSESSMENT AND PLAN:    72 year female with history of Crohn's disease who presents with fever and cough.  1. Sepsis: Patient presents with fever, tachypnea and leukocytosis Sepsis is due to influenza A Sepsis is resolved  2. Acute hypoxic respiratory failure in the setting of Influenza A: Patient is started on Tamiflu and will need 5 days of treatment Given positive flu I will discontinue antibiotics Wean oxygen to room air She does not need steroids at this point   3. History of Crohn's disease: Continue mesalamine     Management plans discussed with the patient and she is in agreement.  CODE STATUS: full  TOTAL TIME TAKING CARE OF THIS PATIENT: 30 minutes.     POSSIBLE D/C today or tomorrow, DEPENDING ON need for O2   Bryna Razavi M.D on 04/30/2017 at 8:33 AM  Between 7am to 6pm - Pager - [phone] After 6pm go to [email or web address] - password [email or web address] [facility]  Office  [phone]  CC: Primary care physician; Glendon Axe, MD  Note: This dictation was prepared with Dragon dictation along with smaller phrase technology. Any transcriptional errors that result from this process are unintentional.

## 2017-05-01 MED ORDER — BENZONATATE 200 MG PO CAPS: 200.0000 mg | ORAL_CAPSULE | Freq: Three times a day (TID) | ORAL | 0 refills | Status: DC | PRN

## 2017-05-01 MED ORDER — OSELTAMIVIR PHOSPHATE 75 MG PO CAPS: 75.0000 mg | ORAL_CAPSULE | Freq: Two times a day (BID) | ORAL | 0 refills | Status: AC

## 2017-05-01 MED ORDER — BENZONATATE 100 MG PO CAPS
200.0000 mg | ORAL_CAPSULE | Freq: Three times a day (TID) | ORAL | Status: DC | PRN
  Administered 2017-05-01: 200 mg via ORAL
  Filled 2017-05-01: qty 2

## 2017-05-01 NOTE — Progress Notes (Signed)
Pt woke up c/o persistent coughing with pink-tinged sputum. Pt has no PRN for cough per MAR. Dr. Jerelyn Charles paged and ordered for Tessalon Pearls 200mg  TID PRN. Will administer as ordered.

## 2017-05-01 NOTE — Progress Notes (Signed)
Pt ready for d/c home today per MD. Discharge instructions and prescriptions reviewed with pt and her husband, all questions answered. Pt in NAD on RA, VSS. PIV removed. Pt will be assisted to car via volunteer.   [place], Jerry Caras

## 2017-05-01 NOTE — Progress Notes (Signed)
Received a call from [facility], pt had 17 beats of SVT. Pt sleeping comfortably on bed when checked.

## 2017-05-01 NOTE — Progress Notes (Addendum)
Pt ambulated around unit on RA with no difficulties. Lowest oxygen saturation was 91, highest was 93 during ambulation. At rest after walk, O2 saturation was 95%. Pt had no breathing difficulties during or after mobility. Does not qualify for home O2 per CM.   [place], Jerry Caras

## 2017-05-01 NOTE — Discharge Summary (Signed)
[facility] [place] NAME: Kimberly Andrade    MR#:  [id number]  DATE OF BIRTH:  1945-07-04  DATE OF ADMISSION:  04/29/2017 ADMITTING PHYSICIAN: Saundra Shelling, MD  DATE OF DISCHARGE: 05/01/2017  PRIMARY CARE PHYSICIAN: Glendon Axe, MD    ADMISSION DIAGNOSIS:  Bronchitis [J40] Acute respiratory failure with hypoxia ([place]) [J96.01]  DISCHARGE DIAGNOSIS:  Active Problems:   Hypoxia   Sepsis ([place])   SECONDARY DIAGNOSIS:   Past Medical History:  Diagnosis Date  . Arthritis   . Crohn disease ([place])   . Depression   . DJD (degenerative joint disease)   . Fibromyalgia   . Full dentures   . GERD (gastroesophageal reflux disease)   . Hypercholesterolemia   . Osteoporosis   . Reflux   . Sleep apnea    uses a cpap  . Wears glasses     HOSPITAL COURSE:   72 year female with history of Crohn's disease who presents with fever and cough.  1. Sepsis: Patient presents with fever, tachypnea and leukocytosis Sepsis is due to influenza A Sepsis has resolved  2. Acute hypoxic respiratory failure in the setting of Influenza A: Patient has been started on Tamiflu and will need 5 days of treatment    3. History of Crohn's disease: Continue mesalamine 4. History of OSA: Continue CPAP at night. Patient has had concerns of hypoxia during nighttime. This has been discussed with her pulmonologist. She will require oxygen upon discharge.    DISCHARGE CONDITIONS AND DIET:   Stable for discharge and regular diet  CONSULTS OBTAINED:    DRUG ALLERGIES:   Allergies  Allergen Reactions  . Codeine Other (See Comments)    gastritis  . Fosamax [Alendronate Sodium] Other (See Comments)    Bones hurt  . Hydrocodone Nausea And Vomiting  . Lipitor [Atorvastatin] Other (See Comments)    pain    DISCHARGE MEDICATIONS:   Allergies as of 05/01/2017      Reactions   Codeine Other (See Comments)   gastritis   Fosamax [alendronate  Sodium] Other (See Comments)   Bones hurt   Hydrocodone Nausea And Vomiting   Lipitor [atorvastatin] Other (See Comments)   pain      Medication List    TAKE these medications   aspirin EC 81 MG tablet Take 81 mg by mouth daily.   benzonatate 200 MG capsule Commonly known as:  TESSALON Take 1 capsule (200 mg total) by mouth 3 (three) times daily as needed for cough.   Calcium 600-200 MG-UNIT tablet Take 1 tablet by mouth daily.   cholecalciferol 1000 units tablet Commonly known as:  VITAMIN D Take 1,000 Units by mouth daily.   ferrous sulfate 325 (65 FE) MG tablet Take 325 mg by mouth daily with breakfast.   Garlic 9937 MG Caps Take 1,000 mg by mouth daily.   Magnesium 250 MG Tabs Take 250 mg by mouth daily.   mesalamine 500 MG CR capsule Commonly known as:  PENTASA Take 1,000 mg by mouth 4 (four) times daily.   multivitamin with minerals tablet Take 1 tablet by mouth daily.   omeprazole 40 MG capsule Commonly known as:  PRILOSEC Take 40 mg by mouth daily.   oseltamivir 75 MG capsule Commonly known as:  TAMIFLU Take 1 capsule (75 mg total) by mouth 2 (two) times daily for 3 days.   oxybutynin 10 MG 24 hr tablet Commonly known as:  DITROPAN-XL Take 10 mg by mouth at  bedtime.   Potassium 99 MG Tabs Take 99 mg by mouth daily.   vitamin B-12 1000 MCG tablet Commonly known as:  CYANOCOBALAMIN Take 1,000 mcg by mouth daily.            Durable Medical Equipment  (From admission, onward)        Start     Ordered   Unscheduled  DME Oxygen  Once    Question Answer Comment  Mode or (Route) Nasal cannula   Liters per Minute 2   Frequency Continuous (stationary and portable oxygen unit needed)   Oxygen conserving device Yes   Oxygen delivery system Gas      05/01/17 0959        Today   CHIEF COMPLAINT:   Patient is ready for discharge. Still has a cough however much improved since admission.   VITAL SIGNS:  Blood pressure 125/78, pulse  62, temperature 98 F (36.7 C), temperature source Oral, resp. rate 18, height 5' 2"  (1.575 m), weight 74.1 kg (163 lb 4.8 oz), SpO2 94 %.   REVIEW OF SYSTEMS:  Review of Systems  Constitutional: Negative.  Negative for chills, fever and malaise/fatigue.  HENT: Negative.  Negative for ear discharge, ear pain, hearing loss, nosebleeds and sore throat.   Eyes: Negative.  Negative for blurred vision and pain.  Respiratory: Positive for cough. Negative for hemoptysis, shortness of breath and wheezing.   Cardiovascular: Negative.  Negative for chest pain, palpitations and leg swelling.  Gastrointestinal: Negative.  Negative for abdominal pain, blood in stool, diarrhea, nausea and vomiting.  Genitourinary: Negative.  Negative for dysuria.  Musculoskeletal: Negative.  Negative for back pain.  Skin: Negative.   Neurological: Negative for dizziness, tremors, speech change, focal weakness, seizures and headaches.  Endo/Heme/Allergies: Negative.  Does not bruise/bleed easily.  Psychiatric/Behavioral: Negative.  Negative for depression, hallucinations and suicidal ideas.     PHYSICAL EXAMINATION:  GENERAL:  72 y.o.-year-old patient lying in the bed with no acute distress.  NECK:  Supple, no jugular venous distention. No thyroid enlargement, no tenderness.  LUNGS: Normal breath sounds bilaterally, no wheezing, rales,rhonchi  No use of accessory muscles of respiration.  CARDIOVASCULAR: S1, S2 normal. No murmurs, rubs, or gallops.  ABDOMEN: Soft, non-tender, non-distended. Bowel sounds present. No organomegaly or mass.  EXTREMITIES: No pedal edema, cyanosis, or clubbing.  PSYCHIATRIC: The patient is alert and oriented x 3.  SKIN: No obvious rash, lesion, or ulcer.   DATA REVIEW:   CBC Recent Labs  Lab 04/30/17 0806  WBC 12.1*  HGB 13.1  HCT 39.8  PLT 226    Chemistries  Recent Labs  Lab 04/29/17 1309 04/30/17 0247  NA 133* 139  K 3.6 3.9  CL 105 108  CO2 18* 23  GLUCOSE 183*  195*  BUN 16 15  CREATININE 0.77 0.74  CALCIUM 8.9 8.9  MG  --  2.1  AST 27  --   ALT 20  --   ALKPHOS 35*  --   BILITOT 0.7  --     Cardiac Enzymes Recent Labs  Lab 04/29/17 1309  TROPONINI <0.03    Microbiology Results  @[email or web address] @  RADIOLOGY:  Dg Chest 2 View  Result Date: 04/29/2017 CLINICAL DATA:  Cough and fever for several days, some lightheadedness and chest soreness EXAM: CHEST  2 VIEW COMPARISON:  Chest x-ray of 10/02/2010 FINDINGS: No pneumonia or effusion is seen. There is mild basilar atelectasis present. There is peribronchial thickening present which may indicate bronchitis in this  patient with a smoking history. Mediastinal and hilar contours are unremarkable. The heart is mildly enlarged and stable. Right shoulder prosthesis is noted. There is a moderate size hiatal hernia present. No bony abnormality is seen. IMPRESSION: 1. No pneumonia. Probable bronchitis. Bibasilar linear atelectasis or scarring. 2. Moderate-sized hiatal hernia. Electronically Signed   By: Ivar Drape [name]   On: 04/29/2017 13:39      Allergies as of 05/01/2017      Reactions   Codeine Other (See Comments)   gastritis   Fosamax [alendronate Sodium] Other (See Comments)   Bones hurt   Hydrocodone Nausea And Vomiting   Lipitor [atorvastatin] Other (See Comments)   pain      Medication List    TAKE these medications   aspirin EC 81 MG tablet Take 81 mg by mouth daily.   benzonatate 200 MG capsule Commonly known as:  TESSALON Take 1 capsule (200 mg total) by mouth 3 (three) times daily as needed for cough.   Calcium 600-200 MG-UNIT tablet Take 1 tablet by mouth daily.   cholecalciferol 1000 units tablet Commonly known as:  VITAMIN D Take 1,000 Units by mouth daily.   ferrous sulfate 325 (65 FE) MG tablet Take 325 mg by mouth daily with breakfast.   Garlic 4818 MG Caps Take 1,000 mg by mouth daily.   Magnesium 250 MG Tabs Take 250 mg by mouth daily.   mesalamine 500 MG  CR capsule Commonly known as:  PENTASA Take 1,000 mg by mouth 4 (four) times daily.   multivitamin with minerals tablet Take 1 tablet by mouth daily.   omeprazole 40 MG capsule Commonly known as:  PRILOSEC Take 40 mg by mouth daily.   oseltamivir 75 MG capsule Commonly known as:  TAMIFLU Take 1 capsule (75 mg total) by mouth 2 (two) times daily for 3 days.   oxybutynin 10 MG 24 hr tablet Commonly known as:  DITROPAN-XL Take 10 mg by mouth at bedtime.   Potassium 99 MG Tabs Take 99 mg by mouth daily.   vitamin B-12 1000 MCG tablet Commonly known as:  CYANOCOBALAMIN Take 1,000 mcg by mouth daily.            Durable Medical Equipment  (From admission, onward)        Start     Ordered   Unscheduled  DME Oxygen  Once    Question Answer Comment  Mode or (Route) Nasal cannula   Liters per Minute 2   Frequency Continuous (stationary and portable oxygen unit needed)   Oxygen conserving device Yes   Oxygen delivery system Gas      05/01/17 0959         Management plans discussed with the patient and she is in agreement. Stable for discharge home  Patient should follow up with pcp  CODE STATUS:     Code Status Orders  (From admission, onward)        Start     Ordered   04/29/17 2048  Full code  Continuous     04/29/17 2047    Code Status History    Date Active Date Inactive Code Status Order ID Comments User Context   This patient has a current code status but no historical code status.      TOTAL TIME TAKING CARE OF THIS PATIENT: 38 minutes.    Note: This dictation was prepared with Dragon dictation along with smaller phrase technology. Any transcriptional errors that result from this process are unintentional.  Eddie Koc M.D on 05/01/2017 at 9:59 AM  Between 7am to 6pm - Pager - [phone] After 6pm go to [email or web address] - password [email or web address] [facility]  Office  [phone]  CC: Primary care physician; Glendon Axe,  MD

## 2017-05-02 LAB — URINE CULTURE: CULTURE: NO GROWTH

## 2017-05-04 LAB — CULTURE, BLOOD (ROUTINE X 2)
CULTURE: NO GROWTH
CULTURE: NO GROWTH
SPECIAL REQUESTS: ADEQUATE
SPECIAL REQUESTS: ADEQUATE

## 2017-07-08 ENCOUNTER — Other Ambulatory Visit: Payer: Self-pay | Admitting: Internal Medicine

## 2017-07-14 ENCOUNTER — Other Ambulatory Visit: Payer: Self-pay | Admitting: Internal Medicine

## 2017-07-14 DIAGNOSIS — Z1231 Encounter for screening mammogram for malignant neoplasm of breast: Secondary | ICD-10-CM

## 2017-07-29 ENCOUNTER — Other Ambulatory Visit: Payer: Self-pay | Admitting: Orthopedic Surgery

## 2017-08-07 ENCOUNTER — Encounter (HOSPITAL_COMMUNITY): Payer: Self-pay

## 2017-08-07 ENCOUNTER — Ambulatory Visit
Admission: RE | Admit: 2017-08-07 | Discharge: 2017-08-07 | Disposition: A | Discharge status: Home / Self Care | Payer: Medicare Other | Source: Ambulatory Visit | Attending: Internal Medicine | Admitting: Internal Medicine

## 2017-08-07 DIAGNOSIS — Z1231 Encounter for screening mammogram for malignant neoplasm of breast: Secondary | ICD-10-CM | POA: Diagnosis not present

## 2017-08-07 NOTE — Pre-Procedure Instructions (Signed)
Kimberly Andrade  08/07/2017      [facility] - [place], [place] - [id number] [place] [address] Phone: [phone] Fax: [phone]    Your procedure is scheduled on 08-14-2017 Thursday .  Report to [facility] Admitting at 10:20 A.M.   Call this number if you have problems the morning of surgery:  [phone]   Remember: Nothing to eat or drink after Midnight                       Take these medicines the morning of surgery with A SIP OF WATER   Tylenol if needed Albuterol inhaler(Bring with you to Hospital day of surgery Flovent Inhaler Mesalamine(Pentasa) Omeprazole(Prilosec) Oxybutynin(Ditropan-XL)  STOP TAKING ANY ASPIRIN(UNLESS OTHERWISE INSTRUCTED BY YOUR SURGEON),ANTIINFLAMATORIES (IBUPROFEN,ALEVE,MOTRIN,ADVIL,GOODY'S POWDERS),HERBAL SUPPLEMENTS,FISH OIL,AND VITAMINS 5-7 DAYS PRIOR TO SURGERY     Do not wear jewelry, make-up or nail polish.  Do not wear lotions, powders, or perfumes, or deodorant.  Do not shave 48 hours prior to surgery.  Men may shave face and neck.  Do not bring valuables to the hospital.  [facility] is not responsible for any belongings or valuables.  Contacts, dentures or bridgework may not be worn into surgery.  Leave your suitcase in the car.  After surgery it may be brought to your room.  For patients admitted to the hospital, discharge time will be determined by your treatment team.  Patients discharged the day of surgery will not be allowed to drive home.      [facility]- Preparing For Surgery  Before surgery, you can play an important role. Because skin is not sterile, your skin needs to be as free of germs as possible. You can reduce the number of germs on your skin by washing with CHG (chlorahexidine gluconate) Soap before surgery.  CHG is an antiseptic cleaner which kills germs and bonds with the skin to continue killing germs even after washing.    Oral Hygiene is  also important to reduce your risk of infection.  Remember - BRUSH YOUR TEETH THE MORNING OF SURGERY WITH YOUR REGULAR TOOTHPASTE  Please do not use if you have an allergy to CHG or antibacterial soaps. If your skin becomes reddened/irritated stop using the CHG.  Do not shave (including legs and underarms) for at least 48 hours prior to first CHG shower. It is OK to shave your face.  Please follow these instructions carefully.   1. Shower the NIGHT BEFORE SURGERY and the MORNING OF SURGERY with CHG.   2. If you chose to wash your hair, wash your hair first as usual with your normal shampoo.  3. After you shampoo, rinse your hair and body thoroughly to remove the shampoo.  4. Use CHG as you would any other liquid soap. You can apply CHG directly to the skin and wash gently with a scrungie or a clean washcloth.   5. Apply the CHG Soap to your body ONLY FROM THE NECK DOWN.  Do not use on open wounds or open sores. Avoid contact with your eyes, ears, mouth and genitals (private parts). Wash Face and genitals (private parts)  with your normal soap.  6. Wash thoroughly, paying special attention to the area where your surgery will be performed.  7. Thoroughly rinse your body with warm water from the neck down.  8. DO NOT shower/wash with your normal soap after using and rinsing off the CHG Soap.  9. Pat yourself dry with a CLEAN TOWEL.  10. Wear CLEAN PAJAMAS to bed the night before surgery, wear comfortable clothes the morning of surgery  11. Place CLEAN SHEETS on your bed the night of your first shower and DO NOT SLEEP WITH PETS.    Day of Surgery:  Do not apply any deodorants/lotions.  Please wear clean clothes to the hospital/surgery center.   Remember to brush your teeth WITH YOUR REGULAR TOOTHPASTE.   Please read over the following fact sheets that you were given. Coughing and Deep Breathing and Surgical Site Infection Prevention,Incentive Spirometry

## 2017-08-08 ENCOUNTER — Encounter (HOSPITAL_COMMUNITY): Payer: Self-pay

## 2017-08-08 ENCOUNTER — Encounter (HOSPITAL_COMMUNITY)
Admission: RE | Admit: 2017-08-08 | Discharge: 2017-08-08 | Disposition: A | Discharge status: Home / Self Care | Payer: Medicare Other | Source: Ambulatory Visit | Attending: Orthopedic Surgery | Admitting: Orthopedic Surgery

## 2017-08-08 DIAGNOSIS — Z01812 Encounter for preprocedural laboratory examination: Secondary | ICD-10-CM | POA: Insufficient documentation

## 2017-08-08 HISTORY — DX: Diverticulosis of intestine, part unspecified, without perforation or abscess without bleeding: K57.90

## 2017-08-08 HISTORY — DX: Unspecified urinary incontinence: R32

## 2017-08-08 HISTORY — DX: Crohn's disease, unspecified, without complications: K50.90

## 2017-08-08 HISTORY — DX: Sleep related leg cramps: G47.62

## 2017-08-08 HISTORY — DX: Anemia, unspecified: D64.9

## 2017-08-08 HISTORY — DX: Other specified disorders of bone density and structure, unspecified site: M85.80

## 2017-08-08 HISTORY — DX: Hyperlipidemia, unspecified: E78.5

## 2017-08-08 HISTORY — DX: Prediabetes: R73.03

## 2017-08-08 HISTORY — DX: Personal history of other diseases of the digestive system: Z87.19

## 2017-08-08 HISTORY — DX: Dyspnea, unspecified: R06.00

## 2017-08-08 HISTORY — DX: Deficiency of other specified B group vitamins: E53.8

## 2017-08-08 LAB — BASIC METABOLIC PANEL
ANION GAP: 7 (ref 5–15)
BUN: 21 mg/dL — ABNORMAL HIGH (ref 6–20)
CO2: 25 mmol/L (ref 22–32)
Calcium: 9 mg/dL (ref 8.9–10.3)
Chloride: 110 mmol/L (ref 101–111)
Creatinine, Ser: 0.87 mg/dL (ref 0.44–1.00)
GLUCOSE: 100 mg/dL — AB (ref 65–99)
Potassium: 4.1 mmol/L (ref 3.5–5.1)
SODIUM: 142 mmol/L (ref 135–145)

## 2017-08-08 LAB — CBC
HCT: 40.6 % (ref 36.0–46.0)
HEMOGLOBIN: 13 g/dL (ref 12.0–15.0)
MCH: 27.9 pg (ref 26.0–34.0)
MCHC: 32 g/dL (ref 30.0–36.0)
MCV: 87.1 fL (ref 78.0–100.0)
Platelets: 250 10*3/uL (ref 150–400)
RBC: 4.66 MIL/uL (ref 3.87–5.11)
RDW: 13.8 % (ref 11.5–15.5)
WBC: 6.3 10*3/uL (ref 4.0–10.5)

## 2017-08-08 NOTE — Progress Notes (Signed)
Anesthesia Chart Review:   Case:  [id number] Date/Time:  08/14/17 1221   Procedure:  SHOULDER ARTHROSCOPY WITH ROTATOR CUFF REPAIR AND SUBACROMIAL DECOMPRESSION (Left )   Anesthesia type:  Choice   Pre-op diagnosis:      LEFT SHOULDER ROTATOR CUFF TEAR     M75.122   Location:  [facility] ROOM 07 / [facility]   Surgeon:  Tania Ade, MD      DISCUSSION: - Pt is a 72 year old female with hx OSA, PVCs.   - Hospitalized 2/26-2/28/19 for acute respiratory failure with hypoxia, sepsis due to influenza A   VS: BP (!) 145/76   Pulse 62   Temp 36.8 C   Resp 20   Ht 5\' 2"  (1.575 m)   Wt 169 lb 11.2 oz (77 kg)   SpO2 95%   BMI 31.04 kg/m   PROVIDERS: PCP is Glendon Axe, MD (notes in care everywhere)  Pulmonologist is Wallene Huh, MD who cleared pt for surgery at last office visit 07/25/17 (notes in care everywhere)  Cardiologist is Serafina Royals, MD who follows pt for PVCs. Last office visit 04/14/17 with Etta Quill, NP   LABS: Labs reviewed: Acceptable for surgery. (all labs ordered are listed, but only abnormal results are displayed)  Labs Reviewed  BASIC METABOLIC PANEL - Abnormal; Notable for the following components:      Result Value   Glucose, Bld 100 (*)    BUN 21 (*)    All other components within normal limits  CBC     IMAGES:  CXR 04/29/17:  1. No pneumonia. Probable bronchitis. Bibasilar linear atelectasis or scarring. 2. Moderate-sized hiatal hernia.   EKG 04/29/17: NSR. Cannot rule out Anterior infarct (cited on or before 13-Nov-2009). T wave abnormality, consider inferior ischemia - EKG done in the setting of sepsis, influenza.  Will repeat day of surgery.    CV:  Carotid duplex 08/06/16:  1. Mild right carotid bifurcation and proximal ICA atherosclerotic vascular disease. No flow limiting stenosis. Degree stenosis less than 50%. 2. Left carotid widely patent. No significant atherosclerotic vascular disease. 3. Vertebrals are patent with antegrade  flow.  Echo 10/24/15 (care everywhere):  1.  Normal LV systolic function with mild LVH.  EF >55%  2.  Normal RV systolic function. 3.  Mild mitral regurgitation, mild tricuspid regurgitation, mild pulmonic regurgitation. 4.  No valvular stenosis. 5.  Mild pulmonary hypertension.  Holter monitor 10/16/2015 [facility]): - Baseline NSR with maximum HR of 123 bpm, minimum 48 bpm, average of 67 bpm. - There was occasional pre-atrial contractions. - There was a periods of sinus bradycardia with first-degree AV block, there were multiple periods of short runs of supraventricular tachycardia between 3 and 6 beats which were nonsustained and asymptomatic. - There was no diary entry  Exercise stress test 10/02/15: requested from PCP's office   Past Medical History:  Diagnosis Date  . Anemia   . Anxiety   . Arthritis   . B12 deficiency   . Crohn disease ([place])   . Crohn's disease ([place])   . Depression   . Diverticulosis   . DJD (degenerative joint disease)   . DJD (degenerative joint disease)   . Dyspnea    with exertion  . Fibromyalgia   . Full dentures   . GERD (gastroesophageal reflux disease)   . History of hiatal hernia   . Hypercholesterolemia   . Hyperlipidemia   . Incontinence of urine   . Nocturnal leg cramps   .  Osteopenia   . Osteoporosis   . Pre-diabetes   . Reflux   . Sleep apnea    uses a cpap  . Wears glasses     Past Surgical History:  Procedure Laterality Date  . ABDOMINAL HYSTERECTOMY    . APPENDECTOMY    . CARPEL TUNNEL    . CHOLECYSTECTOMY    . COLONOSCOPY    . COLONOSCOPY WITH PROPOFOL N/A 03/11/2016   Procedure: COLONOSCOPY WITH PROPOFOL;  Surgeon: Lollie Sails, MD;  Location: [facility] ENDOSCOPY;  Service: Endoscopy;  Laterality: N/A;  . DILATION AND CURETTAGE OF UTERUS    . ESOPHAGOGASTRODUODENOSCOPY N/A 03/11/2016   Procedure: ESOPHAGOGASTRODUODENOSCOPY (EGD);  Surgeon: Lollie Sails, MD;  Location[facility] ENDOSCOPY;  Service: Endoscopy;   Laterality: N/A;  . FOOT SURGERY Right   . GALLBLADDER SURGERY    . HAMMER TOE SURGERY    . JOINT REPLACEMENT    . KNEE ARTHROSCOPY Right 05/25/2013   Procedure: ARTHROSCOPY KNEE;  Surgeon: Hessie Dibble, MD;  Location: [place];  Service: Orthopedics;  Laterality: Right;  medial and lateral meniscal debridment and chondroplasty  . repair of rotator cuff    . SHOULDER SURGERY Right    X 3  . UPPER GI ENDOSCOPY      MEDICATIONS: . acetaminophen (TYLENOL) 325 MG tablet  . albuterol (PROVENTIL HFA;VENTOLIN HFA) 108 (90 Base) MCG/ACT inhaler  . aspirin EC 81 MG tablet  . benzonatate (TESSALON) 200 MG capsule  . Calcium 600-200 MG-UNIT tablet  . cholecalciferol (VITAMIN D) 1000 units tablet  . escitalopram (LEXAPRO) 5 MG tablet  . ferrous sulfate 325 (65 FE) MG tablet  . FLOVENT HFA 110 MCG/ACT inhaler  . Garlic 5284 MG CAPS  . Magnesium 250 MG TABS  . mesalamine (PENTASA) 500 MG CR capsule  . Multiple Vitamins-Minerals (MULTIVITAMIN WITH MINERALS) tablet  . omeprazole (PRILOSEC) 40 MG capsule  . oxybutynin (DITROPAN-XL) 10 MG 24 hr tablet  . Potassium 99 MG TABS  . vitamin B-12 (CYANOCOBALAMIN) 1000 MCG tablet   No current facility-administered medications for this encounter.     If EKG acceptable day of surgery, I anticipate pt can proceed with surgery as scheduled.  Willeen Cass, FNP-BC [facility]/Anesthesiology Phone: [phone] 08/13/2017 4:40 PM

## 2017-08-12 DIAGNOSIS — M5137 Other intervertebral disc degeneration, lumbosacral region: Secondary | ICD-10-CM | POA: Insufficient documentation

## 2017-08-13 MED ORDER — BUPIVACAINE LIPOSOME 1.3 % IJ SUSP
20.0000 mL | INTRAMUSCULAR | Status: DC
Start: 2017-08-14 — End: 2017-08-14
  Filled 2017-08-13: qty 20

## 2017-08-14 ENCOUNTER — Encounter (HOSPITAL_COMMUNITY)
Admission: RE | Disposition: A | Discharge status: Home / Self Care | Payer: Self-pay | Source: Ambulatory Visit | Attending: Orthopedic Surgery

## 2017-08-14 ENCOUNTER — Ambulatory Visit (HOSPITAL_COMMUNITY): Payer: Medicare Other | Admitting: Emergency Medicine

## 2017-08-14 ENCOUNTER — Encounter (HOSPITAL_COMMUNITY): Payer: Self-pay | Admitting: *Deleted

## 2017-08-14 ENCOUNTER — Ambulatory Visit (HOSPITAL_COMMUNITY): Payer: Medicare Other | Admitting: Anesthesiology

## 2017-08-14 ENCOUNTER — Ambulatory Visit (HOSPITAL_COMMUNITY)
Admission: RE | Admit: 2017-08-14 | Discharge: 2017-08-14 | Disposition: A | Discharge status: Home / Self Care | Payer: Medicare Other | Source: Ambulatory Visit | Attending: Orthopedic Surgery | Admitting: Orthopedic Surgery

## 2017-08-14 DIAGNOSIS — Z79899 Other long term (current) drug therapy: Secondary | ICD-10-CM | POA: Insufficient documentation

## 2017-08-14 DIAGNOSIS — K5790 Diverticulosis of intestine, part unspecified, without perforation or abscess without bleeding: Secondary | ICD-10-CM | POA: Diagnosis not present

## 2017-08-14 DIAGNOSIS — D649 Anemia, unspecified: Secondary | ICD-10-CM | POA: Insufficient documentation

## 2017-08-14 DIAGNOSIS — K5090 Crohn's disease, unspecified, without complications: Secondary | ICD-10-CM | POA: Diagnosis not present

## 2017-08-14 DIAGNOSIS — Z9660 Presence of unspecified orthopedic joint implant: Secondary | ICD-10-CM | POA: Diagnosis not present

## 2017-08-14 DIAGNOSIS — E785 Hyperlipidemia, unspecified: Secondary | ICD-10-CM | POA: Insufficient documentation

## 2017-08-14 DIAGNOSIS — Z7951 Long term (current) use of inhaled steroids: Secondary | ICD-10-CM | POA: Insufficient documentation

## 2017-08-14 DIAGNOSIS — Z888 Allergy status to other drugs, medicaments and biological substances status: Secondary | ICD-10-CM | POA: Insufficient documentation

## 2017-08-14 DIAGNOSIS — F519 Sleep disorder not due to a substance or known physiological condition, unspecified: Secondary | ICD-10-CM | POA: Diagnosis not present

## 2017-08-14 DIAGNOSIS — F419 Anxiety disorder, unspecified: Secondary | ICD-10-CM | POA: Insufficient documentation

## 2017-08-14 DIAGNOSIS — Z9049 Acquired absence of other specified parts of digestive tract: Secondary | ICD-10-CM | POA: Diagnosis not present

## 2017-08-14 DIAGNOSIS — F329 Major depressive disorder, single episode, unspecified: Secondary | ICD-10-CM | POA: Diagnosis not present

## 2017-08-14 DIAGNOSIS — R0681 Apnea, not elsewhere classified: Secondary | ICD-10-CM | POA: Diagnosis not present

## 2017-08-14 DIAGNOSIS — E7800 Pure hypercholesterolemia, unspecified: Secondary | ICD-10-CM | POA: Insufficient documentation

## 2017-08-14 DIAGNOSIS — M75122 Complete rotator cuff tear or rupture of left shoulder, not specified as traumatic: Secondary | ICD-10-CM | POA: Diagnosis present

## 2017-08-14 DIAGNOSIS — Z87891 Personal history of nicotine dependence: Secondary | ICD-10-CM | POA: Insufficient documentation

## 2017-08-14 DIAGNOSIS — R32 Unspecified urinary incontinence: Secondary | ICD-10-CM | POA: Insufficient documentation

## 2017-08-14 DIAGNOSIS — M810 Age-related osteoporosis without current pathological fracture: Secondary | ICD-10-CM | POA: Insufficient documentation

## 2017-08-14 DIAGNOSIS — K449 Diaphragmatic hernia without obstruction or gangrene: Secondary | ICD-10-CM | POA: Diagnosis not present

## 2017-08-14 DIAGNOSIS — Z8249 Family history of ischemic heart disease and other diseases of the circulatory system: Secondary | ICD-10-CM | POA: Diagnosis not present

## 2017-08-14 DIAGNOSIS — Z885 Allergy status to narcotic agent status: Secondary | ICD-10-CM | POA: Insufficient documentation

## 2017-08-14 DIAGNOSIS — G4762 Sleep related leg cramps: Secondary | ICD-10-CM | POA: Insufficient documentation

## 2017-08-14 DIAGNOSIS — Z90710 Acquired absence of both cervix and uterus: Secondary | ICD-10-CM | POA: Diagnosis not present

## 2017-08-14 DIAGNOSIS — E538 Deficiency of other specified B group vitamins: Secondary | ICD-10-CM | POA: Diagnosis not present

## 2017-08-14 DIAGNOSIS — R7303 Prediabetes: Secondary | ICD-10-CM | POA: Insufficient documentation

## 2017-08-14 DIAGNOSIS — M797 Fibromyalgia: Secondary | ICD-10-CM | POA: Insufficient documentation

## 2017-08-14 DIAGNOSIS — M8580 Other specified disorders of bone density and structure, unspecified site: Secondary | ICD-10-CM | POA: Diagnosis not present

## 2017-08-14 DIAGNOSIS — R0600 Dyspnea, unspecified: Secondary | ICD-10-CM | POA: Insufficient documentation

## 2017-08-14 DIAGNOSIS — K219 Gastro-esophageal reflux disease without esophagitis: Secondary | ICD-10-CM | POA: Diagnosis not present

## 2017-08-14 DIAGNOSIS — Z800 Family history of malignant neoplasm of digestive organs: Secondary | ICD-10-CM | POA: Insufficient documentation

## 2017-08-14 HISTORY — PX: SHOULDER ARTHROSCOPY WITH ROTATOR CUFF REPAIR AND SUBACROMIAL DECOMPRESSION: SHX5686

## 2017-08-14 SURGERY — SHOULDER ARTHROSCOPY WITH ROTATOR CUFF REPAIR AND SUBACROMIAL DECOMPRESSION
Anesthesia: General | Site: Shoulder | Laterality: Left

## 2017-08-14 MED ORDER — ONDANSETRON HCL 4 MG/2ML IJ SOLN
INTRAMUSCULAR | Status: AC
  Filled 2017-08-14: qty 2

## 2017-08-14 MED ORDER — BUPIVACAINE HCL (PF) 0.5 % IJ SOLN
INTRAMUSCULAR | Status: DC | PRN
  Administered 2017-08-14: 10 mL via PERINEURAL

## 2017-08-14 MED ORDER — OXYCODONE-ACETAMINOPHEN 5-325 MG PO TABS: 1.0000 | ORAL_TABLET | ORAL | 0 refills | Status: DC | PRN

## 2017-08-14 MED ORDER — FENTANYL CITRATE (PF) 100 MCG/2ML IJ SOLN
INTRAMUSCULAR | Status: AC
  Administered 2017-08-14: 50 ug via INTRAVENOUS
  Filled 2017-08-14: qty 2

## 2017-08-14 MED ORDER — BUPIVACAINE LIPOSOME 1.3 % IJ SUSP
INTRAMUSCULAR | Status: DC | PRN
  Administered 2017-08-14: 10 mL via PERINEURAL

## 2017-08-14 MED ORDER — DEXTROSE 5 % IV SOLN: INTRAVENOUS | Status: DC | PRN

## 2017-08-14 MED ORDER — LIDOCAINE 2% (20 MG/ML) 5 ML SYRINGE
INTRAMUSCULAR | Status: DC | PRN
  Administered 2017-08-14: 60 mg via INTRAVENOUS

## 2017-08-14 MED ORDER — PROMETHAZINE HCL 25 MG/ML IJ SOLN: 6.2500 mg | INTRAMUSCULAR | Status: DC | PRN

## 2017-08-14 MED ORDER — FENTANYL CITRATE (PF) 100 MCG/2ML IJ SOLN: 25.0000 ug | INTRAMUSCULAR | Status: DC | PRN

## 2017-08-14 MED ORDER — MIDAZOLAM HCL 2 MG/2ML IJ SOLN
2.0000 mg | Freq: Once | INTRAMUSCULAR | Status: AC
  Administered 2017-08-14: 2 mg via INTRAVENOUS

## 2017-08-14 MED ORDER — ONDANSETRON HCL 4 MG PO TABS: 4.0000 mg | ORAL_TABLET | Freq: Three times a day (TID) | ORAL | 0 refills | Status: DC | PRN

## 2017-08-14 MED ORDER — GLYCOPYRROLATE 0.2 MG/ML IJ SOLN
INTRAMUSCULAR | Status: DC | PRN
  Administered 2017-08-14: .2 mg via INTRAVENOUS

## 2017-08-14 MED ORDER — KETOROLAC TROMETHAMINE 30 MG/ML IJ SOLN: 15.0000 mg | Freq: Once | INTRAMUSCULAR | Status: DC | PRN

## 2017-08-14 MED ORDER — ROCURONIUM BROMIDE 10 MG/ML (PF) SYRINGE
PREFILLED_SYRINGE | INTRAVENOUS | Status: DC | PRN
  Administered 2017-08-14: 60 mg via INTRAVENOUS

## 2017-08-14 MED ORDER — SODIUM CHLORIDE 0.9 % IR SOLN
Status: DC | PRN
  Administered 2017-08-14 (×2): 3000 mL

## 2017-08-14 MED ORDER — FENTANYL CITRATE (PF) 100 MCG/2ML IJ SOLN
INTRAMUSCULAR | Status: DC | PRN
  Administered 2017-08-14: 25 ug via INTRAVENOUS
  Administered 2017-08-14: 50 ug via INTRAVENOUS
  Administered 2017-08-14: 25 ug via INTRAVENOUS

## 2017-08-14 MED ORDER — CEFAZOLIN SODIUM-DEXTROSE 2-3 GM-%(50ML) IV SOLR
INTRAVENOUS | Status: DC | PRN
  Administered 2017-08-14: 2 g via INTRAVENOUS

## 2017-08-14 MED ORDER — LACTATED RINGERS IV SOLN
Freq: Once | INTRAVENOUS | Status: AC
  Administered 2017-08-14: 11:00:00 via INTRAVENOUS

## 2017-08-14 MED ORDER — PHENYLEPHRINE 40 MCG/ML (10ML) SYRINGE FOR IV PUSH (FOR BLOOD PRESSURE SUPPORT)
PREFILLED_SYRINGE | INTRAVENOUS | Status: DC | PRN
  Administered 2017-08-14: 80 ug via INTRAVENOUS

## 2017-08-14 MED ORDER — LACTATED RINGERS IV SOLN
INTRAVENOUS | Status: DC | PRN
  Administered 2017-08-14: 13:00:00 via INTRAVENOUS

## 2017-08-14 MED ORDER — DEXAMETHASONE SODIUM PHOSPHATE 10 MG/ML IJ SOLN
INTRAMUSCULAR | Status: DC | PRN
  Administered 2017-08-14: 5 mg via INTRAVENOUS

## 2017-08-14 MED ORDER — MIDAZOLAM HCL 2 MG/2ML IJ SOLN
INTRAMUSCULAR | Status: AC
  Administered 2017-08-14: 2 mg via INTRAVENOUS
  Filled 2017-08-14: qty 2

## 2017-08-14 MED ORDER — 0.9 % SODIUM CHLORIDE (POUR BTL) OPTIME
TOPICAL | Status: DC | PRN
  Administered 2017-08-14: 1000 mL

## 2017-08-14 MED ORDER — FENTANYL CITRATE (PF) 100 MCG/2ML IJ SOLN
50.0000 ug | Freq: Once | INTRAMUSCULAR | Status: AC
  Administered 2017-08-14: 50 ug via INTRAVENOUS

## 2017-08-14 MED ORDER — FENTANYL CITRATE (PF) 250 MCG/5ML IJ SOLN
INTRAMUSCULAR | Status: AC
  Filled 2017-08-14: qty 5

## 2017-08-14 MED ORDER — SUGAMMADEX SODIUM 200 MG/2ML IV SOLN
INTRAVENOUS | Status: DC | PRN
  Administered 2017-08-14: 300 mg via INTRAVENOUS

## 2017-08-14 MED ORDER — POVIDONE-IODINE 7.5 % EX SOLN
Freq: Once | CUTANEOUS | Status: DC
  Filled 2017-08-14: qty 118

## 2017-08-14 MED ORDER — ONDANSETRON HCL 4 MG/2ML IJ SOLN
INTRAMUSCULAR | Status: DC | PRN
  Administered 2017-08-14: 4 mg via INTRAVENOUS

## 2017-08-14 MED ORDER — CEFAZOLIN SODIUM-DEXTROSE 2-4 GM/100ML-% IV SOLN
INTRAVENOUS | Status: AC
  Filled 2017-08-14: qty 100

## 2017-08-14 MED ORDER — SUGAMMADEX SODIUM 200 MG/2ML IV SOLN: INTRAVENOUS | Status: DC | PRN

## 2017-08-14 MED ORDER — BUPIVACAINE-EPINEPHRINE (PF) 0.25% -1:200000 IJ SOLN
INTRAMUSCULAR | Status: AC
  Filled 2017-08-14: qty 30

## 2017-08-14 MED ORDER — DEXAMETHASONE SODIUM PHOSPHATE 10 MG/ML IJ SOLN
INTRAMUSCULAR | Status: AC
  Filled 2017-08-14: qty 1

## 2017-08-14 MED ORDER — CEFAZOLIN SODIUM-DEXTROSE 2-4 GM/100ML-% IV SOLN: 2.0000 g | INTRAVENOUS | Status: DC

## 2017-08-14 MED ORDER — PROPOFOL 10 MG/ML IV BOLUS
INTRAVENOUS | Status: DC | PRN
  Administered 2017-08-14: 20 mg via INTRAVENOUS
  Administered 2017-08-14: 150 mg via INTRAVENOUS

## 2017-08-14 SURGICAL SUPPLY — 60 items
BLADE SURG 11 STRL SS (BLADE) ×3 IMPLANT
BUR OVAL 4.0 (BURR) ×3 IMPLANT
CANNULA 5.75X71 LONG (CANNULA) ×3 IMPLANT
CANNULA TWIST IN 8.25X7CM (CANNULA) ×3 IMPLANT
CHLORAPREP W/TINT 26ML (MISCELLANEOUS) ×3 IMPLANT
CLOSURE WOUND 1/2 X4 (GAUZE/BANDAGES/DRESSINGS) ×1
CUTTER BONE 4.0MM X 13CM (MISCELLANEOUS) ×3 IMPLANT
DRAPE INCISE IOBAN 66X45 STRL (DRAPES) ×3 IMPLANT
DRAPE ORTHO SPLIT 77X108 STRL (DRAPES) ×4
DRAPE STERI 35X30 U-POUCH (DRAPES) ×3 IMPLANT
DRAPE SURG 17X23 STRL (DRAPES) ×3 IMPLANT
DRAPE SURG ORHT 6 SPLT 77X108 (DRAPES) ×2 IMPLANT
DRAPE U-SHAPE 47X51 STRL (DRAPES) ×3 IMPLANT
DRAPE U-SHAPE 76X120 STRL (DRAPES) ×3 IMPLANT
DRSG AQUACEL AG ADV 3.5X 6 (GAUZE/BANDAGES/DRESSINGS) ×3 IMPLANT
DRSG PAD ABDOMINAL 8X10 ST (GAUZE/BANDAGES/DRESSINGS) ×6 IMPLANT
GAUZE SPONGE 4X4 12PLY STRL (GAUZE/BANDAGES/DRESSINGS) ×3 IMPLANT
GAUZE XEROFORM 1X8 LF (GAUZE/BANDAGES/DRESSINGS) ×3 IMPLANT
GLOVE BIO SURGEON STRL SZ7 (GLOVE) ×6 IMPLANT
GLOVE BIO SURGEON STRL SZ7.5 (GLOVE) ×3 IMPLANT
GLOVE BIOGEL PI IND STRL 7.0 (GLOVE) ×2 IMPLANT
GLOVE BIOGEL PI IND STRL 8 (GLOVE) ×1 IMPLANT
GLOVE BIOGEL PI INDICATOR 7.0 (GLOVE) ×4
GLOVE BIOGEL PI INDICATOR 8 (GLOVE) ×2
GOWN STRL REUS W/ TWL LRG LVL3 (GOWN DISPOSABLE) ×3 IMPLANT
GOWN STRL REUS W/ TWL XL LVL3 (GOWN DISPOSABLE) ×1 IMPLANT
GOWN STRL REUS W/TWL LRG LVL3 (GOWN DISPOSABLE) ×6
GOWN STRL REUS W/TWL XL LVL3 (GOWN DISPOSABLE) ×2
KIT BASIN OR (CUSTOM PROCEDURE TRAY) ×3 IMPLANT
KIT TURNOVER KIT B (KITS) ×3 IMPLANT
MANIFOLD NEPTUNE II (INSTRUMENTS) ×3 IMPLANT
NDL SUT 6 .5 CRC .975X.05 MAYO (NEEDLE) IMPLANT
NEEDLE HYPO 25GX1X1/2 BEV (NEEDLE) IMPLANT
NEEDLE MAYO TAPER (NEEDLE)
NEEDLE SCORPION MULTI FIRE (NEEDLE) IMPLANT
NEEDLE SPNL 18GX3.5 QUINCKE PK (NEEDLE) ×3 IMPLANT
PACK SHOULDER (CUSTOM PROCEDURE TRAY) ×3 IMPLANT
PAD ARMBOARD 7.5X6 YLW CONV (MISCELLANEOUS) ×6 IMPLANT
RESECTOR FULL RADIUS 4.2MM (BLADE) ×3 IMPLANT
SLING ARM FOAM STRAP LRG (SOFTGOODS) ×3 IMPLANT
SLING ARM FOAM STRAP MED (SOFTGOODS) IMPLANT
SPONGE LAP 4X18 RFD (DISPOSABLE) IMPLANT
STRIP CLOSURE SKIN 1/2X4 (GAUZE/BANDAGES/DRESSINGS) ×2 IMPLANT
SUPPORT WRAP ARM LG (MISCELLANEOUS) ×3 IMPLANT
SUT 2 FIBERLOOP 20 STRT BLUE (SUTURE) ×3
SUT ETHILON 3 0 PS 1 (SUTURE) ×3 IMPLANT
SUT TIGER TAPE 7 IN WHITE (SUTURE) ×3 IMPLANT
SUTURE 2 FIBERLOOP 20 STRT BLU (SUTURE) ×1 IMPLANT
SUTURE TAPE 1.3 40 TPR END (SUTURE) ×2 IMPLANT
SUTURETAPE 1.3 40 TPR END (SUTURE) ×6
SYR CONTROL 10ML LL (SYRINGE) ×3 IMPLANT
TAPE FIBER 2MM 7IN #2 BLUE (SUTURE) ×3 IMPLANT
TOWEL OR 17X24 6PK STRL BLUE (TOWEL DISPOSABLE) ×3 IMPLANT
TOWEL OR 17X26 10 PK STRL BLUE (TOWEL DISPOSABLE) ×3 IMPLANT
TOWEL OR NON WOVEN STRL DISP B (DISPOSABLE) ×3 IMPLANT
TUBE CONNECTING 12'X1/4 (SUCTIONS) ×1
TUBE CONNECTING 12X1/4 (SUCTIONS) ×2 IMPLANT
TUBING ARTHROSCOPY IRRIG 16FT (MISCELLANEOUS) ×3 IMPLANT
WAND HAND CNTRL MULTIVAC 90 (MISCELLANEOUS) ×3 IMPLANT
WATER STERILE IRR 1000ML POUR (IV SOLUTION) ×3 IMPLANT

## 2017-08-14 NOTE — H&P (Signed)
Kimberly Andrade is an 72 y.o. female.   Chief Complaint: Left shoulder pain and dysfunction the patient is a 72 year old female with HPI: Left shoulder large rotator cuff tear after an injury, failed conservative management.  Indicated for surgical treatment to try and decrease pain and restore function.  Past Medical History:  Diagnosis Date  . Anemia   . Anxiety   . Arthritis   . B12 deficiency   . Crohn disease ([place])   . Crohn's disease ([place])   . Depression   . Diverticulosis   . DJD (degenerative joint disease)   . DJD (degenerative joint disease)   . Dyspnea    with exertion  . Fibromyalgia   . Full dentures   . GERD (gastroesophageal reflux disease)   . History of hiatal hernia   . Hypercholesterolemia   . Hyperlipidemia   . Incontinence of urine   . Nocturnal leg cramps   . Osteopenia   . Osteoporosis   . Pre-diabetes   . Reflux   . Sleep apnea    uses a cpap  . Wears glasses     Past Surgical History:  Procedure Laterality Date  . ABDOMINAL HYSTERECTOMY    . APPENDECTOMY    . CARPEL TUNNEL    . CHOLECYSTECTOMY    . COLONOSCOPY    . COLONOSCOPY WITH PROPOFOL N/A 03/11/2016   Procedure: COLONOSCOPY WITH PROPOFOL;  Surgeon: Lollie Sails, MD;  Location: [facility] ENDOSCOPY;  Service: Endoscopy;  Laterality: N/A;  . DILATION AND CURETTAGE OF UTERUS    . ESOPHAGOGASTRODUODENOSCOPY N/A 03/11/2016   Procedure: ESOPHAGOGASTRODUODENOSCOPY (EGD);  Surgeon: Lollie Sails, MD;  Location: [facility] ENDOSCOPY;  Service: Endoscopy;  Laterality: N/A;  . FOOT SURGERY Right   . GALLBLADDER SURGERY    . HAMMER TOE SURGERY    . JOINT REPLACEMENT    . KNEE ARTHROSCOPY Right 05/25/2013   Procedure: ARTHROSCOPY KNEE;  Surgeon: Hessie Dibble, MD;  Location: [place];  Service: Orthopedics;  Laterality: Right;  medial and lateral meniscal debridment and chondroplasty  . repair of rotator cuff    . SHOULDER SURGERY Right    X 3  . UPPER GI ENDOSCOPY       Family History  Problem Relation Age of Onset  . Heart disease Mother   . Esophageal cancer Father    Social History:  reports that she quit smoking about 15 years ago. She has never used smokeless tobacco. She reports that she does not drink alcohol or use drugs.  Allergies:  Allergies  Allergen Reactions  . Fosamax [Alendronate Sodium] Other (See Comments)    Bones hurt  . Lipitor [Atorvastatin] Other (See Comments)    Bone pain  . Codeine Other (See Comments)     Codeine in the liquid form causes gastritis  . Hydrocodone Nausea And Vomiting    Medications Prior to Admission  Medication Sig Dispense Refill  . acetaminophen (TYLENOL) 325 MG tablet Take 975 mg by mouth daily as needed (hip pain).    . Calcium 600-200 MG-UNIT tablet Take 1 tablet by mouth daily.    . cholecalciferol (VITAMIN D) 1000 units tablet Take 1,000 Units by mouth daily.    Marland Kitchen escitalopram (LEXAPRO) 5 MG tablet Take 5 mg by mouth at bedtime.    . ferrous sulfate 325 (65 FE) MG tablet Take 325 mg by mouth daily with breakfast.    . FLOVENT HFA 110 MCG/ACT inhaler Take 1 puff by mouth 2 (two) times daily.  1  . Garlic 0459 MG CAPS Take 1,000 mg by mouth daily.     . Magnesium 250 MG TABS Take 250 mg by mouth daily.     . mesalamine (PENTASA) 500 MG CR capsule Take 1,000 mg by mouth 4 (four) times daily.     . Multiple Vitamins-Minerals (MULTIVITAMIN WITH MINERALS) tablet Take 1 tablet by mouth daily.    Marland Kitchen omeprazole (PRILOSEC) 40 MG capsule Take 40 mg by mouth daily.    Marland Kitchen oxybutynin (DITROPAN-XL) 10 MG 24 hr tablet Take 10 mg by mouth daily.     . Potassium 99 MG TABS Take 99 mg by mouth daily.     . vitamin B-12 (CYANOCOBALAMIN) 1000 MCG tablet Take 1,000 mcg by mouth daily.    Marland Kitchen albuterol (PROVENTIL HFA;VENTOLIN HFA) 108 (90 Base) MCG/ACT inhaler Inhale 2 puffs into the lungs 2 (two) times daily.    Marland Kitchen aspirin EC 81 MG tablet Take 81 mg by mouth daily.    . benzonatate (TESSALON) 200 MG capsule Take 1  capsule (200 mg total) by mouth 3 (three) times daily as needed for cough. (Patient not taking: Reported on 08/04/2017) 20 capsule 0    No results found for this or any previous visit (from the past 48 hour(s)). No results found.  ROS  Blood pressure (!) 150/61, pulse (!) 58, temperature 98.2 F (36.8 C), temperature source Oral, resp. rate 20, height 5' 2"  (1.575 m), weight 77 kg (169 lb 11.2 oz), SpO2 94 %. Physical Exam   Assessment/Plan Left shoulder large rotator cuff tear after an injury, failed conservative management.  Indicated for surgical treatment to try and decrease pain and restore function.   Plan left shoulder arthroscopic rotator cuff repair, subacromial decompression Risks / benefits of surgery discussed Consent on chart  NPO for OR Preop antibiotics   Isabella Stalling, MD 08/14/2017, 12:15 PM

## 2017-08-14 NOTE — Anesthesia Preprocedure Evaluation (Signed)
Anesthesia Evaluation  Patient identified by MRN, date of birth, ID band Patient awake    Reviewed: Allergy & Precautions, NPO status , Patient's Chart, lab work & pertinent test results  Airway Mallampati: II  TM Distance: >3 FB Neck ROM: Full    Dental no notable dental hx.    Pulmonary sleep apnea and Continuous Positive Airway Pressure Ventilation , former smoker,    Pulmonary exam normal breath sounds clear to auscultation       Cardiovascular negative cardio ROS Normal cardiovascular exam Rhythm:Regular Rate:Normal     Neuro/Psych negative neurological ROS  negative psych ROS   GI/Hepatic Neg liver ROS, GERD  Medicated,  Endo/Other  negative endocrine ROS  Renal/GU negative Renal ROS  negative genitourinary   Musculoskeletal  (+) Arthritis , Osteoarthritis,    Abdominal   Peds negative pediatric ROS (+)  Hematology negative hematology ROS (+)   Anesthesia Other Findings   Reproductive/Obstetrics negative OB ROS                             Anesthesia Physical Anesthesia Plan  ASA: II  Anesthesia Plan: General   Post-op Pain Management:  Regional for Post-op pain   Induction: Intravenous  PONV Risk Score and Plan: 3 and Ondansetron, Dexamethasone, Midazolam and Treatment may vary due to age or medical condition  Airway Management Planned: Oral ETT  Additional Equipment:   Intra-op Plan:   Post-operative Plan: Extubation in OR  Informed Consent: I have reviewed the patients History and Physical, chart, labs and discussed the procedure including the risks, benefits and alternatives for the proposed anesthesia with the patient or authorized representative who has indicated his/her understanding and acceptance.   Dental advisory given  Plan Discussed with: CRNA and Surgeon  Anesthesia Plan Comments:         Anesthesia Quick Evaluation

## 2017-08-14 NOTE — Discharge Instructions (Signed)

## 2017-08-14 NOTE — Anesthesia Procedure Notes (Signed)
Procedure Name: Intubation Date/Time: 08/14/2017 1:36 PM Performed by: Cleda Daub, CRNA Pre-anesthesia Checklist: Patient identified, Emergency Drugs available, Suction available and Patient being monitored Patient Re-evaluated:Patient Re-evaluated prior to induction Oxygen Delivery Method: Circle system utilized Preoxygenation: Pre-oxygenation with 100% oxygen Induction Type: IV induction Ventilation: Mask ventilation without difficulty and Mask ventilation throughout procedure Laryngoscope Size: Mac and 3 Grade View: Grade I Tube type: Oral Number of attempts: 1 Airway Equipment and Method: Stylet Placement Confirmation: ETT inserted through vocal cords under direct vision,  positive ETCO2 and breath sounds checked- equal and bilateral Secured at: 21 cm Tube secured with: Tape Dental Injury: Teeth and Oropharynx as per pre-operative assessment

## 2017-08-14 NOTE — Transfer of Care (Signed)
Immediate Anesthesia Transfer of Care Note  Patient: Kimberly Andrade  Procedure(s) Performed: SHOULDER ARTHROSCOPY WITH ROTATOR CUFF REPAIR AND SUBACROMIAL DECOMPRESSION (Left Shoulder)  Patient Location: PACU  Anesthesia Type:General  Level of Consciousness: awake, alert , oriented and patient cooperative  Airway & Oxygen Therapy: Patient Spontanous Breathing and Patient connected to face mask oxygen  Post-op Assessment: Report given to RN and Post -op Vital signs reviewed and stable  Post vital signs: Reviewed and stable  Last Vitals:  Vitals Value Taken Time  BP 163/76 08/14/2017  3:03 PM  Temp 36.4 C 08/14/2017  3:00 PM  Pulse 58 08/14/2017  3:11 PM  Resp 9 08/14/2017  3:11 PM  SpO2 97 % 08/14/2017  3:11 PM  Vitals shown include unvalidated device data.  Last Pain:  Vitals:   08/14/17 1500  TempSrc:   PainSc: 0-No pain      Patients Stated Pain Goal: 1 (76/22/63 3354)  Complications: No apparent anesthesia complications

## 2017-08-14 NOTE — Anesthesia Procedure Notes (Signed)
Anesthesia Regional Block: Interscalene brachial plexus block   Pre-Anesthetic Checklist: ,, timeout performed, Correct Patient, Correct Site, Correct Laterality, Correct Procedure, Correct Position, site marked, Risks and benefits discussed,  Surgical consent,  Pre-op evaluation,  At surgeon's request and post-op pain management  Laterality: Left  Prep: chloraprep       Needles:  Injection technique: Single-shot  Needle Type: Echogenic Needle     Needle Length: 9cm      Additional Needles:   Procedures:,,,, ultrasound used (permanent image in chart),,,,  Narrative:  Start time: 08/14/2017 12:08 PM End time: 08/14/2017 12:18 PM Injection made incrementally with aspirations every 5 mL.  Performed by: Personally  Anesthesiologist: Myrtie Soman, MD  Additional Notes: Patient tolerated the procedure well without complications

## 2017-08-14 NOTE — Anesthesia Procedure Notes (Signed)
Anesthesia Procedure Image    

## 2017-08-14 NOTE — Anesthesia Postprocedure Evaluation (Signed)
Anesthesia Post Note  Patient: TARYNE KIGER  Procedure(s) Performed: SHOULDER ARTHROSCOPY WITH ROTATOR CUFF REPAIR AND SUBACROMIAL DECOMPRESSION (Left Shoulder)     Patient location during evaluation: PACU Anesthesia Type: General Level of consciousness: awake and alert Pain management: pain level controlled Vital Signs Assessment: post-procedure vital signs reviewed and stable Respiratory status: spontaneous breathing, nonlabored ventilation and respiratory function stable Cardiovascular status: blood pressure returned to baseline and stable Postop Assessment: no apparent nausea or vomiting Anesthetic complications: no    Last Vitals:  Vitals:   08/14/17 1530 08/14/17 1550  BP: (!) 163/79 (!) 160/89  Pulse: (!) 54 (!) 57  Resp: 17 15  Temp: (!) 36.1 C   SpO2: 92% 99%    Last Pain:  Vitals:   08/14/17 1550  TempSrc:   PainSc: 0-No pain                 Audry Pili

## 2017-08-14 NOTE — Op Note (Signed)
Procedure(s):  Kimberly Andrade female 72 y.o. 08/14/2017  Procedure(s) and Anesthesia Type:    #1 left shoulder arthroscopic extensive debridement irreparable rotator cuff tear, labral tears #2 left shoulder arthroscopic subacromial decompression   Surgeon(s) and Role:    Tania Ade, MD - Primary     Surgeon: Isabella Stalling   Assistants: Jeanmarie Hubert PA-C [facility] was present and scrubbed throughout the procedure and was essential in positioning, assisting with the camera and instrumentation,, and closure)  Anesthesia: General endotracheal anesthesia with preoperative interscalene block given by the attending anesthesiologist    Procedure Detail   Estimated Blood Loss: Min         Drains: none  Blood Given: none         Specimens: none        Complications:  * No complications entered in OR log *         Disposition: PACU - hemodynamically stable.         Condition: stable    Procedure:   INDICATIONS FOR SURGERY: The patient is 72 y.o. female who injured her left shoulder was found to have a large rotator cuff tear.  She was indicated for surgical treatment to try and decrease pain and restore function.  She understood given the size of her tear and her age that it was possible this was not a repairable tear but elected to go for surgery to attempt this, understanding that even if it can only be debrided this would likely help with her pain.  OPERATIVE FINDINGS: Examination under anesthesia: Mild stiffness in forward flexion which was resolved with a gentle lysis of adhesions.   DESCRIPTION OF PROCEDURE: The patient was identified in preoperative  holding area where I personally marked the operative site after  verifying site, side, and procedure with the patient. An interscalene block was given by the attending anesthesiologist the holding area.  The patient was taken back to the operating room where general anesthesia was induced without  complication and was placed in the beach-chair position with the back  elevated about 60 degrees and all extremities and head and neck carefully padded and  positioned.   The left upper extremity was then prepped and  draped in a standard sterile fashion. The appropriate time-out  procedure was carried out. The patient did receive IV antibiotics  within 30 minutes of incision.   A small posterior portal incision was made and the arthroscope was introduced into the joint. An anterior portal was then established above the subscapularis using needle localization. Small cannula was placed anteriorly. Diagnostic arthroscopy was then carried out.  the subscapularis was noted to be somewhat thinned but grossly intact.  She did not have any significant chondromalacia and glenohumeral joint surfaces.  There was circumferential labral tearing which was extensively debrided.  The biceps long head tendon was noted to be absent from the joint.  The supraspinatus and infraspinatus were completely torn and significantly retracted.  The arthroscope was then introduced into the subacromial space a standard lateral portal was established with needle localization. The shaver was used through the lateral portal to perform extensive bursectomy.  Viewing from the posterior lateral and posterior lateral the subacromial space was cleaned up and the tear edge was debrided.  The tendon quality was overall reasonable, however it was severely retracted and significantly scarred in.  After freeing up both above and below the tendons I was still unable to bring the tendon back to the tuberosity.  This  was felt to likely represent an acute on chronic tear and was not felt to be repairable.  Tear edge was debrided back to healthy tissue.  The tuberosity was debrided down to a smooth surface.  The coracoacromial ligament was taken down off the anterior acromion with the ArthroCare exposing a craggy hooked anterior acromial spur. A  high-speed bur was then used through the lateral portal to take down the anterior acromial spur from lateral to medial in a standard acromioplasty.  The acromioplasty was also viewed from the lateral portal and the bur was used as necessary to ensure that the acromion was completely flat from posterior to anterior.  The arthroscopic equipment was removed from the joint and the portals were closed with 3-0 nylon in an interrupted fashion. Sterile dressings were then applied including Xeroform 4 x 4's ABDs and tape. The patient was then allowed to awaken from general anesthesia, placed in a sling, transferred to the stretcher and taken to the recovery room in stable condition.   POSTOPERATIVE PLAN: The patient will be discharged home today and will followup in one week for suture removal and wound check.

## 2017-08-15 ENCOUNTER — Encounter (HOSPITAL_COMMUNITY): Payer: Self-pay | Admitting: Orthopedic Surgery

## 2017-10-28 DIAGNOSIS — F325 Major depressive disorder, single episode, in full remission: Secondary | ICD-10-CM | POA: Insufficient documentation

## 2017-11-11 ENCOUNTER — Ambulatory Visit
Admission: RE | Admit: 2017-11-11 | Discharge: 2017-11-11 | Disposition: A | Discharge status: Home / Self Care | Payer: Medicare Other | Source: Ambulatory Visit | Attending: Gastroenterology | Admitting: Gastroenterology

## 2017-11-11 ENCOUNTER — Encounter
Admission: RE | Disposition: A | Discharge status: Home / Self Care | Payer: Self-pay | Source: Ambulatory Visit | Attending: Gastroenterology

## 2017-11-11 ENCOUNTER — Ambulatory Visit: Payer: Medicare Other | Admitting: Anesthesiology

## 2017-11-11 ENCOUNTER — Encounter: Payer: Self-pay | Admitting: *Deleted

## 2017-11-11 DIAGNOSIS — E538 Deficiency of other specified B group vitamins: Secondary | ICD-10-CM | POA: Insufficient documentation

## 2017-11-11 DIAGNOSIS — K449 Diaphragmatic hernia without obstruction or gangrene: Secondary | ICD-10-CM | POA: Diagnosis not present

## 2017-11-11 DIAGNOSIS — Z79899 Other long term (current) drug therapy: Secondary | ICD-10-CM | POA: Insufficient documentation

## 2017-11-11 DIAGNOSIS — Z87891 Personal history of nicotine dependence: Secondary | ICD-10-CM | POA: Insufficient documentation

## 2017-11-11 DIAGNOSIS — F419 Anxiety disorder, unspecified: Secondary | ICD-10-CM | POA: Diagnosis not present

## 2017-11-11 DIAGNOSIS — Z09 Encounter for follow-up examination after completed treatment for conditions other than malignant neoplasm: Secondary | ICD-10-CM | POA: Insufficient documentation

## 2017-11-11 DIAGNOSIS — K5090 Crohn's disease, unspecified, without complications: Secondary | ICD-10-CM | POA: Insufficient documentation

## 2017-11-11 DIAGNOSIS — K219 Gastro-esophageal reflux disease without esophagitis: Secondary | ICD-10-CM | POA: Insufficient documentation

## 2017-11-11 DIAGNOSIS — M797 Fibromyalgia: Secondary | ICD-10-CM | POA: Diagnosis not present

## 2017-11-11 DIAGNOSIS — F329 Major depressive disorder, single episode, unspecified: Secondary | ICD-10-CM | POA: Diagnosis not present

## 2017-11-11 DIAGNOSIS — M810 Age-related osteoporosis without current pathological fracture: Secondary | ICD-10-CM | POA: Diagnosis not present

## 2017-11-11 DIAGNOSIS — Z7982 Long term (current) use of aspirin: Secondary | ICD-10-CM | POA: Insufficient documentation

## 2017-11-11 DIAGNOSIS — G4730 Sleep apnea, unspecified: Secondary | ICD-10-CM | POA: Insufficient documentation

## 2017-11-11 HISTORY — DX: Diaphragmatic hernia without obstruction or gangrene: K44.9

## 2017-11-11 HISTORY — PX: ESOPHAGOGASTRODUODENOSCOPY (EGD) WITH PROPOFOL: SHX5813

## 2017-11-11 SURGERY — ESOPHAGOGASTRODUODENOSCOPY (EGD) WITH PROPOFOL
Anesthesia: General

## 2017-11-11 MED ORDER — LIDOCAINE HCL (PF) 1 % IJ SOLN
2.0000 mL | Freq: Once | INTRAMUSCULAR | Status: AC
  Administered 2017-11-11: 0.3 mL via INTRADERMAL

## 2017-11-11 MED ORDER — LIDOCAINE HCL (PF) 1 % IJ SOLN
INTRAMUSCULAR | Status: AC
  Administered 2017-11-11: 0.3 mL via INTRADERMAL
  Filled 2017-11-11: qty 2

## 2017-11-11 MED ORDER — PROPOFOL 10 MG/ML IV BOLUS
INTRAVENOUS | Status: AC
  Filled 2017-11-11: qty 20

## 2017-11-11 MED ORDER — PROPOFOL 10 MG/ML IV BOLUS
INTRAVENOUS | Status: DC | PRN
  Administered 2017-11-11: 80 mg via INTRAVENOUS

## 2017-11-11 MED ORDER — GLYCOPYRROLATE 0.2 MG/ML IJ SOLN
INTRAMUSCULAR | Status: AC
  Filled 2017-11-11: qty 1

## 2017-11-11 MED ORDER — GLYCOPYRROLATE 0.2 MG/ML IJ SOLN
INTRAMUSCULAR | Status: DC | PRN
  Administered 2017-11-11: 0.2 mg via INTRAVENOUS

## 2017-11-11 MED ORDER — SODIUM CHLORIDE 0.9 % IV SOLN
INTRAVENOUS | Status: DC
  Administered 2017-11-11: 1000 mL via INTRAVENOUS

## 2017-11-11 MED ORDER — PROPOFOL 500 MG/50ML IV EMUL
INTRAVENOUS | Status: DC | PRN
  Administered 2017-11-11: 150 ug/kg/min via INTRAVENOUS

## 2017-11-11 MED ORDER — LIDOCAINE HCL (PF) 2 % IJ SOLN
INTRAMUSCULAR | Status: AC
  Filled 2017-11-11: qty 10

## 2017-11-11 MED ORDER — SODIUM CHLORIDE 0.9 % IV SOLN: INTRAVENOUS | Status: DC

## 2017-11-11 MED ORDER — LIDOCAINE HCL (CARDIAC) PF 100 MG/5ML IV SOSY
PREFILLED_SYRINGE | INTRAVENOUS | Status: DC | PRN
  Administered 2017-11-11: 100 mg via INTRAVENOUS

## 2017-11-11 NOTE — Anesthesia Preprocedure Evaluation (Signed)
Anesthesia Evaluation  Patient identified by MRN, date of birth, ID band Patient awake    Reviewed: Allergy & Precautions, NPO status , Patient's Chart, lab work & pertinent test results  History of Anesthesia Complications Negative for: history of anesthetic complications  Airway Mallampati: II  TM Distance: >3 FB Neck ROM: Full    Dental  (+) Upper Dentures, Lower Dentures, Dental Advidsory Given   Pulmonary sleep apnea, Continuous Positive Airway Pressure Ventilation and Oxygen sleep apnea , neg COPD, former smoker,           Cardiovascular Exercise Tolerance: Good (-) hypertension(-) CAD and (-) Past MI  - Systolic murmurs    Neuro/Psych Depression negative neurological ROS     GI/Hepatic Neg liver ROS, GERD  ,crohns disease    Endo/Other  negative endocrine ROSneg diabetes  Renal/GU negative Renal ROS     Musculoskeletal  (+) Arthritis , Fibromyalgia -  Abdominal (+) - obese,   Peds  Hematology negative hematology ROS (+)   Anesthesia Other Findings Past Medical History: No date: Arthritis No date: Crohn disease ([place]) No date: Depression No date: DJD (degenerative joint disease) No date: Fibromyalgia No date: Full dentures No date: GERD (gastroesophageal reflux disease) No date: Hypercholesterolemia No date: Osteoporosis No date: Reflux No date: Sleep apnea     Comment: uses a cpap No date: Wears glasses   Reproductive/Obstetrics                             Anesthesia Physical  Anesthesia Plan  ASA: III  Anesthesia Plan: General   Post-op Pain Management:    Induction: Intravenous  PONV Risk Score and Plan: 3 and Propofol infusion and TIVA  Airway Management Planned: Natural Airway and Nasal Cannula  Additional Equipment:   Intra-op Plan:   Post-operative Plan:   Informed Consent: I have reviewed the patients History and Physical, chart, labs and discussed  the procedure including the risks, benefits and alternatives for the proposed anesthesia with the patient or authorized representative who has indicated his/her understanding and acceptance.   Dental advisory given  Plan Discussed with: CRNA and Anesthesiologist  Anesthesia Plan Comments:         Anesthesia Quick Evaluation

## 2017-11-11 NOTE — Op Note (Signed)
[facility] Patient Name: Kimberly Andrade Procedure Date: 11/11/2017 9:56 AM MRN: [id number] Account #: [id number] Date of Birth: 11-04-1945 Admit Type: Outpatient Age: 72 Room: [facility] ENDO ROOM 3 Gender: Female Note Status: Finalized Procedure:            Upper GI endoscopy Indications:          Surveillance procedure, Follow-up of gastritis Providers:            Lollie Sails, MD Referring MD:         Glendon Axe (Referring MD) Medicines:            Monitored Anesthesia Care Complications:        No immediate complications. Procedure:            Pre-Anesthesia Assessment:                       - ASA Grade Assessment: III - A patient with severe                        systemic disease.                       After obtaining informed consent, the endoscope was                        passed under direct vision. Throughout the procedure,                        the patient's blood pressure, pulse, and oxygen                        saturations were monitored continuously. The Endoscope                        was introduced through the mouth, with the intention of                        advancing to the duodenum. The scope was advanced to                        the gastric body before the procedure was aborted.                        Medications were given. The upper GI endoscopy was                        unusually difficult due to presence of food. The                        patient tolerated the procedure well. Findings:      The examined esophagus was normal.      A large hiatal hernia was present.      A large amount of food (residue) was found in the cardia and in the       gastric body that precludes visualization for adequate evaluation.       Procedure aborted for aspiration risk. Impression:           - Normal esophagus.                       -  Large hiatal hernia.                       - A large amount of food (residue) in the  stomach.                       - No specimens collected. Recommendation:       - Discharge patient to home.                       - Do a gastric emptying study at appointment to be                        scheduled.                       - consider upper gi series. Repeat EGD with one day of                        clear liquids.                       - Discharge patient to home.                       - Continue present medications. Procedure Code(s):    --- Professional ---                       [phone], 108, Esophagogastroduodenoscopy, flexible,                        transoral; diagnostic, including collection of                        specimen(s) by brushing or washing, when performed                        (separate procedure) Diagnosis Code(s):    --- Professional ---                       K44.9, Diaphragmatic hernia without obstruction or                        gangrene                       K29.70, Gastritis, unspecified, without bleeding CPT copyright 2017 American Medical Association. All rights reserved. The codes documented in this report are preliminary and upon coder review may  be revised to meet current compliance requirements. Lollie Sails, MD 11/11/2017 10:16:44 AM This report has been signed electronically. Number of Addenda: 0 Note Initiated On: 11/11/2017 9:56 AM      [facility]

## 2017-11-11 NOTE — Anesthesia Post-op Follow-up Note (Signed)
Anesthesia QCDR form completed.        

## 2017-11-11 NOTE — Anesthesia Postprocedure Evaluation (Signed)
Anesthesia Post Note  Patient: Kimberly Andrade  Procedure(s) Performed: ESOPHAGOGASTRODUODENOSCOPY (EGD) WITH PROPOFOL (N/A )  Patient location during evaluation: Endoscopy Anesthesia Type: General Level of consciousness: awake and alert Pain management: pain level controlled Vital Signs Assessment: post-procedure vital signs reviewed and stable Respiratory status: spontaneous breathing, nonlabored ventilation, respiratory function stable and patient connected to nasal cannula oxygen Cardiovascular status: blood pressure returned to baseline and stable Postop Assessment: no apparent nausea or vomiting Anesthetic complications: no     Last Vitals:  Vitals:   11/11/17 0925 11/11/17 1014  BP: (!) 145/78 107/65  Pulse: 60 72  Resp: 17 18  Temp: (!) 35.7 C (!) 36.1 C  SpO2: 98% 93%    Last Pain:  Vitals:   11/11/17 1050  TempSrc:   PainSc: 0-No pain                 Martha Clan

## 2017-11-11 NOTE — H&P (Signed)
Outpatient short stay form Pre-procedure 11/11/2017 9:52 AM Kimberly Sails MD  Primary Physician: Glendon Axe MD  Reason for visit: EGD  History of present illness: Patient is a 72 year old female presenting today as above.  She has personal history of Crohn's disease, symptoms have been well controlled.  She also has a history of chronic gastritis has been helicobacter pylori negative.  Does take a daily proton pump inhibitor.  In terms of reflux are currently well controlled.  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  He does take 81 mg aspirin daily that is currently held.  She takes no other aspirin products or blood thinning agent.    Current Facility-Administered Medications:  .  0.9 %  sodium chloride infusion, , Intravenous, Continuous, Kimberly Sails, MD .  0.9 %  sodium chloride infusion, , Intravenous, Continuous, Kimberly Sails, MD, Last Rate: 20 mL/hr at 11/11/17 0943, 1,000 mL at 11/11/17 0943  Medications Prior to Admission  Medication Sig Dispense Refill Last Dose  . albuterol (PROVENTIL HFA;VENTOLIN HFA) 108 (90 Base) MCG/ACT inhaler Inhale 2 puffs into the lungs 2 (two) times daily.   More than a month at Unknown time  . aspirin EC 81 MG tablet Take 81 mg by mouth daily.   11/05/2017  . benzonatate (TESSALON) 200 MG capsule Take 1 capsule (200 mg total) by mouth 3 (three) times daily as needed for cough. (Patient not taking: Reported on 08/04/2017) 20 capsule 0 Not Taking at Unknown time  . Calcium 600-200 MG-UNIT tablet Take 1 tablet by mouth daily.   Past Month at Unknown time  . cholecalciferol (VITAMIN D) 1000 units tablet Take 1,000 Units by mouth daily.   Past Month at Unknown time  . escitalopram (LEXAPRO) 5 MG tablet Take 5 mg by mouth at bedtime.   Past Month at Unknown time  . ferrous sulfate 325 (65 FE) MG tablet Take 325 mg by mouth daily with breakfast.   Past Month at Unknown time  . FLOVENT HFA 110 MCG/ACT inhaler  Take 1 puff by mouth 2 (two) times daily.  1   . Garlic 4098 MG CAPS Take 1,000 mg by mouth daily.    Past Month at Unknown time  . Magnesium 250 MG TABS Take 250 mg by mouth daily.    Past Month at Unknown time  . mesalamine (PENTASA) 500 MG CR capsule Take 1,000 mg by mouth 4 (four) times daily.    Past Month at Unknown time  . Multiple Vitamins-Minerals (MULTIVITAMIN WITH MINERALS) tablet Take 1 tablet by mouth daily.   Past Month at Unknown time  . omeprazole (PRILOSEC) 40 MG capsule Take 40 mg by mouth daily.   08/13/2017 at Unknown time  . ondansetron (ZOFRAN) 4 MG tablet Take 1 tablet (4 mg total) by mouth every 8 (eight) hours as needed for nausea or vomiting. 30 tablet 0   . oxybutynin (DITROPAN-XL) 10 MG 24 hr tablet Take 10 mg by mouth daily.    08/13/2017 at Unknown time  . oxyCODONE-acetaminophen (PERCOCET) 5-325 MG tablet Take 1-2 tablets by mouth every 4 (four) hours as needed for severe pain. 30 tablet 0   . Potassium 99 MG TABS Take 99 mg by mouth daily.    08/13/2017 at Unknown time  . vitamin B-12 (CYANOCOBALAMIN) 1000 MCG tablet Take 1,000 mcg by mouth daily.   Past Month at Unknown time     Allergies  Allergen Reactions  . Fosamax [Alendronate  Sodium] Other (See Comments)    Bones hurt  . Lipitor [Atorvastatin] Other (See Comments)    Bone pain  . Codeine Other (See Comments)     Codeine in the liquid form causes gastritis  . Hydrocodone Nausea And Vomiting     Past Medical History:  Diagnosis Date  . Anemia   . Anxiety   . Arthritis   . B12 deficiency   . Crohn disease ([place])   . Crohn's disease ([place])   . Depression   . Diverticulosis   . DJD (degenerative joint disease)   . DJD (degenerative joint disease)   . Dyspnea    with exertion  . Fibromyalgia   . Full dentures   . GERD (gastroesophageal reflux disease)   . Hiatal hernia   . History of hiatal hernia   . Hypercholesterolemia   . Hyperlipidemia   . Incontinence of urine   . Nocturnal leg cramps    . Osteopenia   . Osteoporosis   . Pre-diabetes   . Reflux   . Sleep apnea    uses a cpap  . Wears glasses     Review of systems:      Physical Exam    Heart and lungs: Good rate and rhythm without rub or gallop, lungs are bilaterally clear.    HEENT: Normocephalic atraumatic eyes are anicteric    Other:    Pertinant exam for procedure: Soft nontender nondistended bowel sounds positive normoactive.    Planned proceedures: EGD and indicated procedures. I have discussed the risks benefits and complications of procedures to include not limited to bleeding, infection, perforation and the risk of sedation and the patient wishes to proceed.    Kimberly Sails, MD Gastroenterology 11/11/2017  9:52 AM

## 2017-11-11 NOTE — Transfer of Care (Signed)
Immediate Anesthesia Transfer of Care Note  Patient: Kimberly Andrade  Procedure(s) Performed: ESOPHAGOGASTRODUODENOSCOPY (EGD) WITH PROPOFOL (N/A )  Patient Location: Endoscopy Unit  Anesthesia Type:General  Level of Consciousness: sedated  Airway & Oxygen Therapy: Patient Spontanous Breathing and Patient connected to nasal cannula oxygen  Post-op Assessment: Report given to RN and Post -op Vital signs reviewed and stable  Post vital signs: Reviewed and stable  Last Vitals:  Vitals Value Taken Time  BP 107/65 11/11/2017 10:14 AM  Temp 36.1 C 11/11/2017 10:14 AM  Pulse 72 11/11/2017 10:15 AM  Resp 18 11/11/2017 10:15 AM  SpO2 94 % 11/11/2017 10:15 AM  Vitals shown include unvalidated device data.  Last Pain:  Vitals:   11/11/17 1014  TempSrc: Tympanic  PainSc: 0-No pain         Complications: No apparent anesthesia complications

## 2017-11-12 ENCOUNTER — Other Ambulatory Visit: Payer: Self-pay | Admitting: Gastroenterology

## 2017-11-12 ENCOUNTER — Encounter: Payer: Self-pay | Admitting: Gastroenterology

## 2017-11-12 DIAGNOSIS — R110 Nausea: Secondary | ICD-10-CM

## 2017-11-13 ENCOUNTER — Other Ambulatory Visit: Payer: Self-pay | Admitting: Orthopaedic Surgery

## 2017-11-13 NOTE — Pre-Procedure Instructions (Addendum)
SOPHEA RACKHAM  11/13/2017    Your procedure is scheduled on Tuesday, November 25, 2017 at 12:35 PM.   Report to [facility] Entrance "A" Admitting Office at 10:30 AM.   Call this number if you have problems the morning of surgery: [phone]   Questions prior to day of surgery, please call [phone] between 8 & 4 PM.   Remember:  Do not eat or drink after midnight Monday, 11/24/17.   Take these medicines the morning of surgery with A SIP OF WATER: Mesalamine (Pentasa)  Stop Aspirin and NSAIDS (Naprosyn, Ibuprofen, etc) 7 days prior to surgery.    Do not wear jewelry, make-up or nail polish.  Do not wear lotions, powders, perfumes or deodorant.  Do not shave 48 hours prior to surgery.    Do not bring valuables to the hospital.  [facility] is not responsible for any belongings or valuables.  Contacts, dentures or bridgework may not be worn into surgery.  Leave your suitcase in the car.  After surgery it may be brought to your room.  For patients admitted to the hospital, discharge time will be determined by your treatment team.  [facility] - Preparing for Surgery  Before surgery, you can play an important role.  Because skin is not sterile, your skin needs to be as free of germs as possible.  You can reduce the number of germs on you skin by washing with CHG (chlorahexidine gluconate) soap before surgery.  CHG is an antiseptic cleaner which kills germs and bonds with the skin to continue killing germs even after washing.  Oral Hygiene is also important in reducing the risk of infection.  Remember to brush your teeth with your regular toothpaste the morning of surgery.  Please DO NOT use if you have an allergy to CHG or antibacterial soaps.  If your skin becomes reddened/irritated stop using the CHG and inform your nurse when you arrive at Short Stay.  Do not shave (including legs and underarms) for at least 48 hours prior to the first CHG shower.  You may shave  your face.  Please follow these instructions carefully:   1.  Shower with CHG Soap the night before surgery and the morning of Surgery.  2.  If you choose to wash your hair, wash your hair first as usual with your normal shampoo.  3.  After you shampoo, rinse your hair and body thoroughly to remove the shampoo. 4.  Use CHG as you would any other liquid soap.  You can apply chg directly to the skin and wash gently with a      scrungie or washcloth.           5.  Apply the CHG Soap to your body ONLY FROM THE NECK DOWN.   Do not use on open wounds or open sores. Avoid contact with your eyes, ears, mouth and genitals (private parts).  Wash genitals (private parts) with your normal soap.  6.  Wash thoroughly, paying special attention to the area where your surgery will be performed.  7.  Thoroughly rinse your body with warm water from the neck down.  8.  DO NOT shower/wash with your normal soap after using and rinsing off the CHG Soap.  9.  Pat yourself dry with a clean towel.            10.  Wear clean pajamas.            11.  Place clean sheets  on your bed the night of your first shower and do not sleep with pets.  Day of Surgery  Shower as above. Do not apply any lotions/deodorants the morning of surgery.   Please wear clean clothes to the hospital. Remember to brush your teeth with toothpaste.   Please read over the fact sheets that you were given.

## 2017-11-14 ENCOUNTER — Encounter (HOSPITAL_COMMUNITY): Payer: Self-pay

## 2017-11-14 ENCOUNTER — Encounter (HOSPITAL_COMMUNITY)
Admission: RE | Admit: 2017-11-14 | Discharge: 2017-11-14 | Disposition: A | Discharge status: Home / Self Care | Payer: Medicare Other | Source: Ambulatory Visit | Attending: Orthopaedic Surgery | Admitting: Orthopaedic Surgery

## 2017-11-14 ENCOUNTER — Other Ambulatory Visit: Payer: Self-pay

## 2017-11-14 DIAGNOSIS — Z79899 Other long term (current) drug therapy: Secondary | ICD-10-CM | POA: Insufficient documentation

## 2017-11-14 DIAGNOSIS — M810 Age-related osteoporosis without current pathological fracture: Secondary | ICD-10-CM | POA: Insufficient documentation

## 2017-11-14 DIAGNOSIS — M1612 Unilateral primary osteoarthritis, left hip: Secondary | ICD-10-CM | POA: Diagnosis not present

## 2017-11-14 DIAGNOSIS — M8580 Other specified disorders of bone density and structure, unspecified site: Secondary | ICD-10-CM | POA: Insufficient documentation

## 2017-11-14 DIAGNOSIS — F419 Anxiety disorder, unspecified: Secondary | ICD-10-CM | POA: Diagnosis not present

## 2017-11-14 DIAGNOSIS — K219 Gastro-esophageal reflux disease without esophagitis: Secondary | ICD-10-CM | POA: Insufficient documentation

## 2017-11-14 DIAGNOSIS — E538 Deficiency of other specified B group vitamins: Secondary | ICD-10-CM | POA: Insufficient documentation

## 2017-11-14 DIAGNOSIS — G4733 Obstructive sleep apnea (adult) (pediatric): Secondary | ICD-10-CM | POA: Insufficient documentation

## 2017-11-14 DIAGNOSIS — K5790 Diverticulosis of intestine, part unspecified, without perforation or abscess without bleeding: Secondary | ICD-10-CM | POA: Diagnosis not present

## 2017-11-14 DIAGNOSIS — Z87891 Personal history of nicotine dependence: Secondary | ICD-10-CM | POA: Diagnosis not present

## 2017-11-14 DIAGNOSIS — D649 Anemia, unspecified: Secondary | ICD-10-CM | POA: Insufficient documentation

## 2017-11-14 DIAGNOSIS — Z7982 Long term (current) use of aspirin: Secondary | ICD-10-CM | POA: Insufficient documentation

## 2017-11-14 DIAGNOSIS — E785 Hyperlipidemia, unspecified: Secondary | ICD-10-CM | POA: Diagnosis not present

## 2017-11-14 DIAGNOSIS — Z01812 Encounter for preprocedural laboratory examination: Secondary | ICD-10-CM | POA: Diagnosis present

## 2017-11-14 DIAGNOSIS — F329 Major depressive disorder, single episode, unspecified: Secondary | ICD-10-CM | POA: Diagnosis not present

## 2017-11-14 DIAGNOSIS — E7800 Pure hypercholesterolemia, unspecified: Secondary | ICD-10-CM | POA: Insufficient documentation

## 2017-11-14 DIAGNOSIS — I088 Other rheumatic multiple valve diseases: Secondary | ICD-10-CM | POA: Diagnosis not present

## 2017-11-14 DIAGNOSIS — R7303 Prediabetes: Secondary | ICD-10-CM | POA: Insufficient documentation

## 2017-11-14 HISTORY — DX: Palpitations: R00.2

## 2017-11-14 LAB — BASIC METABOLIC PANEL
Anion gap: 9 (ref 5–15)
BUN: 15 mg/dL (ref 8–23)
CALCIUM: 9.1 mg/dL (ref 8.9–10.3)
CHLORIDE: 108 mmol/L (ref 98–111)
CO2: 22 mmol/L (ref 22–32)
CREATININE: 0.88 mg/dL (ref 0.44–1.00)
GFR calc Af Amer: >60 mL/min (ref >60)
GFR calc non Af Amer: >60 mL/min (ref >60)
Glucose, Bld: 102 mg/dL — ABNORMAL HIGH (ref 70–99)
Potassium: 4.1 mmol/L (ref 3.5–5.1)
Sodium: 139 mmol/L (ref 135–145)

## 2017-11-14 LAB — CBC WITH DIFFERENTIAL/PLATELET
Abs Immature Granulocytes: 0.1 10*3/uL (ref 0.0–0.1)
BASOS PCT: 1 %
Basophils Absolute: 0.1 10*3/uL (ref 0.0–0.1)
EOS ABS: 0.4 10*3/uL (ref 0.0–0.7)
EOS PCT: 6 %
HEMATOCRIT: 42.3 % (ref 36.0–46.0)
Hemoglobin: 13.3 g/dL (ref 12.0–15.0)
IMMATURE GRANULOCYTES: 1 %
LYMPHS ABS: 1.9 10*3/uL (ref 0.7–4.0)
Lymphocytes Relative: 30 %
MCH: 27.8 pg (ref 26.0–34.0)
MCHC: 31.4 g/dL (ref 30.0–36.0)
MCV: 88.3 fL (ref 78.0–100.0)
MONO ABS: 0.5 10*3/uL (ref 0.1–1.0)
Monocytes Relative: 9 %
NEUTROS PCT: 53 %
Neutro Abs: 3.3 10*3/uL (ref 1.7–7.7)
Platelets: 271 10*3/uL (ref 150–400)
RBC: 4.79 MIL/uL (ref 3.87–5.11)
RDW: 14.5 % (ref 11.5–15.5)
WBC: 6.2 10*3/uL (ref 4.0–10.5)

## 2017-11-14 LAB — SURGICAL PCR SCREEN
MRSA, PCR: NEGATIVE
STAPHYLOCOCCUS AUREUS: NEGATIVE

## 2017-11-14 LAB — URINALYSIS, ROUTINE W REFLEX MICROSCOPIC
BILIRUBIN URINE: NEGATIVE
Glucose, UA: NEGATIVE mg/dL
HGB URINE DIPSTICK: NEGATIVE
Ketones, ur: NEGATIVE mg/dL
Leukocytes, UA: NEGATIVE
NITRITE: NEGATIVE
PROTEIN: NEGATIVE mg/dL
Specific Gravity, Urine: 1.009 (ref 1.005–1.030)
pH: 7 (ref 5.0–8.0)

## 2017-11-14 LAB — ABO/RH: ABO/RH(D): O POS

## 2017-11-14 LAB — APTT: aPTT: 35 seconds (ref 24–36)

## 2017-11-14 LAB — HEMOGLOBIN A1C
HEMOGLOBIN A1C: 6.2 % — AB (ref 4.8–5.6)
MEAN PLASMA GLUCOSE: 131.24 mg/dL

## 2017-11-14 LAB — TYPE AND SCREEN
ABO/RH(D): O POS
Antibody Screen: NEGATIVE

## 2017-11-14 LAB — PROTIME-INR
INR: 1.03
Prothrombin Time: 13.4 seconds (ref 11.4–15.2)

## 2017-11-14 NOTE — Progress Notes (Signed)
Pt has hx of heart palpitations and sees Dr. Nehemiah Massed at [facility]. Pt denies any recent palpitations or chest pain. Pt does have sob on exertion which she states is normal for her. Pt states she is pre-diabetic, does not check her blood sugar at home.  Have requested Stress test result that was done on 10/02/15, cannot see results in Epic.

## 2017-11-17 NOTE — Progress Notes (Signed)
Anesthesia Chart Review:  Case:  [id number] Date/Time:  11/25/17 1220   Procedure:  LEFT TOTAL HIP ARTHROPLASTY ANTERIOR APPROACH (Left Hip)   Anesthesia type:  Spinal   Pre-op diagnosis:  LEFT HIP DEGENERATIVE JOINT DISEASE   Location:  [place] [facility] [place] OR   Surgeon:  Melrose Nakayama, MD      DISCUSSION: 72 yo female former smoker for above procedure. Pertinent hx includes GERD, Depression, Anemia, Anxiety, Palpitations, OSA on CPAP.  S/p shoulder arthroscopy with rotator cuff repair and subacromial decompression 08/14/2017. No anesthesia complications noted.   Prior to this surgery she had clearance from pulmonology on 07/25/2017 "Pulmonary wise here for surgical clearance. She drops her sats during sleep due to the OSA. Her The recurrent bronchitis and rhinitis stable today. Her spirometry showed very mild obstruction ( fev1 > 2 liters). She is cleared from a pulmonary prospective.  Continue albuterol, flovent 2 puffs bid".  Anticipate she can proceed as planned barring acute status change.   VS: BP 137/71   Pulse 62   Temp 36.6 C   Resp 18   Ht 5\' 2"  (1.575 m)   Wt 75.7 kg   SpO2 95%   BMI 30.52 kg/m   PROVIDERS: Glendon Axe, MD is PCP  Wallene Huh, MD is Pulmonologist  Serafina Royals, MD is Cardiologist  LABS: Labs reviewed: Acceptable for surgery. (all labs ordered are listed, but only abnormal results are displayed)  Labs Reviewed  BASIC METABOLIC PANEL - Abnormal; Notable for the following components:      Result Value   Glucose, Bld 102 (*)    All other components within normal limits  HEMOGLOBIN A1C - Abnormal; Notable for the following components:   Hgb A1c MFr Bld 6.2 (*)    All other components within normal limits  SURGICAL PCR SCREEN  APTT  CBC WITH DIFFERENTIAL/PLATELET  PROTIME-INR  URINALYSIS, ROUTINE W REFLEX MICROSCOPIC  TYPE AND SCREEN  ABO/RH     IMAGES: CXR 04/29/17:  1. No pneumonia. Probable bronchitis. Bibasilar linear  atelectasis or scarring. 2. Moderate-sized hiatal hernia.   EKG: 08/14/2017: Sinus bradycardia. Low voltage QRS. Borderline ECG since the previous ECG, the inferior ST/T abn. has improved and the poor R wave progression. The poor R wave progression may have been due to lead placement  CV: Carotid duplex 08/06/16:  1. Mild right carotid bifurcation and proximal ICA atherosclerotic vascular disease. No flow limiting stenosis. Degree stenosis less than 50%. 2. Left carotid widely patent. No significant atherosclerotic vascular disease. 3. Vertebrals are patent with antegrade flow.  Echo 10/24/15 (care everywhere):  1.  Normal LV systolic function with mild LVH.  EF >55%  2.  Normal RV systolic function. 3.  Mild mitral regurgitation, mild tricuspid regurgitation, mild pulmonic regurgitation. 4.  No valvular stenosis. 5.  Mild pulmonary hypertension.  Holter monitor 10/16/2015 [facility]): - Baseline NSR with maximum HR of 123 bpm, minimum 48 bpm, average of 67 bpm. - There was occasional pre-atrial contractions. - There was a periods of sinus bradycardia with first-degree AV block, there were multiple periods of short runs of supraventricular tachycardia between 3 and 6 beats which were nonsustained and asymptomatic. - There was no diary entry  Exercise stress test 10/02/15 [facility]): The patient was stressed according to the Bruce protocol for 6 minutes, achieving a work level of max METS 7.0.  The resting heart rate of 63 bpm rose to maximal heart rate of 141 bpm.  This value represents 94%  of the maximal, age-predicted heart rate.  The resting blood pressure 119/70 mmHg, rest of maximum blood pressure 130/65 mmHg.  The exercise test was stopped due to fatigue.  Summary: Overall impression: Equivocal stress test.  Conclusions:  Patient exercised for 6 minutes.  She achieved 94% of maximal bradycardia heart rate.  Total workload 7 METS.  Test was terminated for complaint  fatigue.  Past Medical History:  Diagnosis Date  . Anemia   . Anxiety   . Arthritis   . B12 deficiency   . Crohn disease ([place])   . Crohn's disease ([place])   . Depression   . Diverticulosis   . DJD (degenerative joint disease)   . DJD (degenerative joint disease)   . Dyspnea    with exertion (incline walking)  . Fibromyalgia    pt denies  . Full dentures   . GERD (gastroesophageal reflux disease)   . Heart palpitations   . Hiatal hernia   . History of hiatal hernia   . Hypercholesterolemia   . Hyperlipidemia   . Incontinence of urine   . Nocturnal leg cramps   . Osteopenia   . Osteoporosis   . Pre-diabetes   . Reflux   . Sleep apnea    uses a cpap  . Wears glasses     Past Surgical History:  Procedure Laterality Date  . ABDOMINAL HYSTERECTOMY    . APPENDECTOMY    . CARPEL TUNNEL    . CHOLECYSTECTOMY    . COLONOSCOPY    . COLONOSCOPY WITH PROPOFOL N/A 03/11/2016   Procedure: COLONOSCOPY WITH PROPOFOL;  Surgeon: Lollie Sails, MD;  Location: [facility] ENDOSCOPY;  Service: Endoscopy;  Laterality: N/A;  . DILATION AND CURETTAGE OF UTERUS    . ESOPHAGOGASTRODUODENOSCOPY N/A 03/11/2016   Procedure: ESOPHAGOGASTRODUODENOSCOPY (EGD);  Surgeon: Lollie Sails, MD;  Location: [facility] ENDOSCOPY;  Service: Endoscopy;  Laterality: N/A;  . ESOPHAGOGASTRODUODENOSCOPY (EGD) WITH PROPOFOL N/A 11/11/2017   Procedure: ESOPHAGOGASTRODUODENOSCOPY (EGD) WITH PROPOFOL;  Surgeon: Lollie Sails, MD;  [facility] ENDOSCOPY;  Service: Endoscopy;  Laterality: N/A;  . FOOT SURGERY Right   . GALLBLADDER SURGERY    . HAMMER TOE SURGERY    . JOINT REPLACEMENT Right 2006   shoulder  . KNEE ARTHROSCOPY Right 05/25/2013   Procedure: ARTHROSCOPY KNEE;  Surgeon: Hessie Dibble, MD;  Location: [place];  Service: Orthopedics;  Laterality: Right;  medial and lateral meniscal debridment and chondroplasty  . repair of rotator cuff    . SHOULDER ARTHROSCOPY WITH ROTATOR CUFF REPAIR  AND SUBACROMIAL DECOMPRESSION Left 08/14/2017   Procedure: SHOULDER ARTHROSCOPY WITH ROTATOR CUFF REPAIR AND SUBACROMIAL DECOMPRESSION;  Surgeon: Tania Ade, MD;  Location: [place];  Service: Orthopedics;  Laterality: Left;  . SHOULDER SURGERY Right    X 3  . UPPER GI ENDOSCOPY      MEDICATIONS: . naproxen (NAPROSYN) 250 MG tablet  . aspirin EC 81 MG tablet  . Calcium Carbonate-Vit D-Min (CALCIUM 600+D3 PLUS MINERALS) 600-800 MG-UNIT TABS  . cholecalciferol (VITAMIN D) 1000 units tablet  . escitalopram (LEXAPRO) 5 MG tablet  . ferrous sulfate 325 (65 FE) MG tablet  . Garlic 3664 MG CAPS  . Magnesium 250 MG TABS  . mesalamine (PENTASA) 500 MG CR capsule  . Multiple Vitamins-Minerals (MULTIVITAMIN WITH MINERALS) tablet  . omeprazole (PRILOSEC) 40 MG capsule  . ondansetron (ZOFRAN) 4 MG tablet  . oxybutynin (DITROPAN-XL) 10 MG 24 hr tablet  . oxyCODONE-acetaminophen (PERCOCET) 5-325 MG tablet  . Potassium 99  MG TABS  . vitamin B-12 (CYANOCOBALAMIN) 1000 MCG tablet   No current facility-administered medications for this encounter.     Wynonia Musty [facility] Short Stay Center/Anesthesiology Phone [phone] 11/17/2017 2:44 PM

## 2017-11-18 ENCOUNTER — Other Ambulatory Visit: Payer: Self-pay | Admitting: Orthopaedic Surgery

## 2017-11-18 NOTE — Care Plan (Signed)
Spoke with patient prior to surgery. She is planning to discharge to home with family and the following arrangements.   DME:  Has walker - 3n1 ordered for delivery to hospital room from [place].  HHPT:  [facility]  OPPT: [place] - 12/08/17 @ 100  MD Follow up:   12/05/17 @ 915   I have spoke with patient and she is agreeable to this plan.   Please contact Ladell Heads, [place] with questions or if this plan should change   Thanks

## 2017-11-24 MED ORDER — TRANEXAMIC ACID 1000 MG/10ML IV SOLN
1000.0000 mg | INTRAVENOUS | Status: AC
  Administered 2017-11-25: 1000 mg via INTRAVENOUS
  Filled 2017-11-24: qty 1000

## 2017-11-24 MED ORDER — BUPIVACAINE LIPOSOME 1.3 % IJ SUSP
10.0000 mL | INTRAMUSCULAR | Status: AC
  Administered 2017-11-25: 20 mL
  Filled 2017-11-24: qty 10

## 2017-11-24 NOTE — H&P (Signed)
TOTAL HIP ADMISSION H&P  Patient is admitted for left total hip arthroplasty.  Subjective:  Chief Complaint: left hip pain  HPI: Kimberly Andrade, 72 y.o. female, has a history of pain and functional disability in the left hip(s) due to arthritis and patient has failed non-surgical conservative treatments for greater than 12 weeks to include NSAID's and/or analgesics, corticosteriod injections, flexibility and strengthening excercises, use of assistive devices, weight reduction as appropriate and activity modification.  Onset of symptoms was gradual starting 5 years ago with gradually worsening course since that time.The patient noted no past surgery on the left hip(s).  Patient currently rates pain in the left hip at 10 out of 10 with activity. Patient has night pain, worsening of pain with activity and weight bearing, trendelenberg gait, pain that interfers with activities of daily living and crepitus. Patient has evidence of subchondral cysts, subchondral sclerosis, periarticular osteophytes and joint space narrowing by imaging studies. This condition presents safety issues increasing the risk of falls.  There is no current active infection.  Patient Active Problem List   Diagnosis Date Noted  . Sepsis ([place]) 04/30/2017  . Hypoxia 04/29/2017   Past Medical History:  Diagnosis Date  . Anemia   . Anxiety   . Arthritis   . B12 deficiency   . Crohn disease ([place])   . Crohn's disease ([place])   . Depression   . Diverticulosis   . DJD (degenerative joint disease)   . DJD (degenerative joint disease)   . Dyspnea    with exertion (incline walking)  . Fibromyalgia    pt denies  . Full dentures   . GERD (gastroesophageal reflux disease)   . Heart palpitations   . Hiatal hernia   . History of hiatal hernia   . Hypercholesterolemia   . Hyperlipidemia   . Incontinence of urine   . Nocturnal leg cramps   . Osteopenia   . Osteoporosis   . Pre-diabetes   . Reflux   . Sleep apnea     uses a cpap  . Wears glasses     Past Surgical History:  Procedure Laterality Date  . ABDOMINAL HYSTERECTOMY    . APPENDECTOMY    . CARPEL TUNNEL    . CHOLECYSTECTOMY    . COLONOSCOPY    . COLONOSCOPY WITH PROPOFOL N/A 03/11/2016   Procedure: COLONOSCOPY WITH PROPOFOL;  Surgeon: Lollie Sails, MD;  Location: [facility] ENDOSCOPY;  Service: Endoscopy;  Laterality: N/A;  . DILATION AND CURETTAGE OF UTERUS    . ESOPHAGOGASTRODUODENOSCOPY N/A 03/11/2016   Procedure: ESOPHAGOGASTRODUODENOSCOPY (EGD);  Surgeon: Lollie Sails, MD;  Location: [facility] ENDOSCOPY;  Service: Endoscopy;  Laterality: N/A;  . ESOPHAGOGASTRODUODENOSCOPY (EGD) WITH PROPOFOL N/A 11/11/2017   Procedure: ESOPHAGOGASTRODUODENOSCOPY (EGD) WITH PROPOFOL;  Surgeon: Lollie Sails, MD;  Location: [facility] ENDOSCOPY;  Service: Endoscopy;  Laterality: N/A;  . FOOT SURGERY Right   . GALLBLADDER SURGERY    . HAMMER TOE SURGERY    . JOINT REPLACEMENT Right 2006   shoulder  . KNEE ARTHROSCOPY Right 05/25/2013   Procedure: ARTHROSCOPY KNEE;  Surgeon: Hessie Dibble, MD;  Location: [place];  Service: Orthopedics;  Laterality: Right;  medial and lateral meniscal debridment and chondroplasty  . repair of rotator cuff    . SHOULDER ARTHROSCOPY WITH ROTATOR CUFF REPAIR AND SUBACROMIAL DECOMPRESSION Left 08/14/2017   Procedure: SHOULDER ARTHROSCOPY WITH ROTATOR CUFF REPAIR AND SUBACROMIAL DECOMPRESSION;  Surgeon: Tania Ade, MD;  Location: [place];  Service: Orthopedics;  Laterality: Left;  .  SHOULDER SURGERY Right    X 3  . UPPER GI ENDOSCOPY      Current Facility-Administered Medications  Medication Dose Route Frequency Provider Last Rate Last Dose  . [START ON 11/25/2017] bupivacaine liposome (EXPAREL) 1.3 % injection 133 mg  10 mL Infiltration To OR Melrose Nakayama, MD      . Derrill Memo ON 11/25/2017] tranexamic acid (CYKLOKAPRON) 1,000 mg in sodium chloride 0.9 % 100 mL IVPB  1,000 mg Intravenous To OR Melrose Nakayama, MD        Current Outpatient Medications  Medication Sig Dispense Refill Last Dose  . aspirin EC 81 MG tablet Take 81 mg by mouth at bedtime.    11/05/2017  . Calcium Carbonate-Vit D-Min (CALCIUM 600+D3 PLUS MINERALS) 600-800 MG-UNIT TABS Take 1 tablet by mouth daily.     . cholecalciferol (VITAMIN D) 1000 units tablet Take 1,000 Units by mouth daily.   Past Month at Unknown time  . escitalopram (LEXAPRO) 5 MG tablet Take 5 mg by mouth at bedtime.   Past Month at Unknown time  . ferrous sulfate 325 (65 FE) MG tablet Take 325 mg by mouth daily with breakfast.   Past Month at Unknown time  . Garlic 3785 MG CAPS Take 1,000 mg by mouth daily.    Past Month at Unknown time  . Magnesium 250 MG TABS Take 250 mg by mouth daily.    Past Month at Unknown time  . mesalamine (PENTASA) 500 MG CR capsule Take 2,000 mg by mouth 2 (two) times daily.    Past Month at Unknown time  . Multiple Vitamins-Minerals (MULTIVITAMIN WITH MINERALS) tablet Take 1 tablet by mouth daily.   Past Month at Unknown time  . omeprazole (PRILOSEC) 40 MG capsule Take 40 mg by mouth at bedtime.    08/13/2017 at Unknown time  . oxybutynin (DITROPAN-XL) 10 MG 24 hr tablet Take 10 mg by mouth at bedtime.    08/13/2017 at Unknown time  . Potassium 99 MG TABS Take 99 mg by mouth daily.    08/13/2017 at Unknown time  . vitamin B-12 (CYANOCOBALAMIN) 1000 MCG tablet Take 1,000 mcg by mouth daily.   Past Month at Unknown time  . naproxen (NAPROSYN) 250 MG tablet Take 500 mg by mouth 2 (two) times daily with a meal.     . ondansetron (ZOFRAN) 4 MG tablet Take 1 tablet (4 mg total) by mouth every 8 (eight) hours as needed for nausea or vomiting. (Patient not taking: Reported on 11/12/2017) 30 tablet 0 Not Taking at Unknown time  . oxyCODONE-acetaminophen (PERCOCET) 5-325 MG tablet Take 1-2 tablets by mouth every 4 (four) hours as needed for severe pain. (Patient not taking: Reported on 11/12/2017) 30 tablet 0 Not Taking at Unknown time   Allergies   Allergen Reactions  . Fosamax [Alendronate Sodium] Other (See Comments)    Bones hurt  . Lipitor [Atorvastatin] Other (See Comments)    Bone pain  . Codeine Other (See Comments)     Codeine in the liquid form causes gastritis  . Hydrocodone Nausea And Vomiting    Social History   Tobacco Use  . Smoking status: Former Smoker    Last attempt to quit: 12/29/2001    Years since quitting: 15.9  . Smokeless tobacco: Never Used  Substance Use Topics  . Alcohol use: No    Family History  Problem Relation Age of Onset  . Heart disease Mother   . Esophageal cancer Father      Review  of Systems  Musculoskeletal: Positive for joint pain.       Left hip pain  All other systems reviewed and are negative.   Objective:  Physical Exam  Constitutional: She is oriented to person, place, and time. She appears well-developed and well-nourished.  HENT:  Head: Normocephalic and atraumatic.  Eyes: Pupils are equal, round, and reactive to light.  Neck: Normal range of motion.  Cardiovascular: Normal rate and regular rhythm.  Respiratory: Effort normal.  GI: Soft.  Musculoskeletal:  Left hip motion is extremely painful especially in internal rotation.  Leg lengths are roughly equal.  She walks with a markedly altered gait.  Sensation and motor function are intact distally.  She has palpable pulses in her feet.  Neurological: She is alert and oriented to person, place, and time.  Skin: Skin is warm and dry.  Psychiatric: She has a normal mood and affect. Her behavior is normal. Judgment and thought content normal.    Vital signs in last 24 hours:    Labs:   Estimated body mass index is 30.52 kg/m as calculated from the following:   Height as of 11/14/17: 5\' 2"  (1.575 m).   Weight as of 11/14/17: 75.7 kg.   Imaging Review Plain radiographs demonstrate severe degenerative joint disease of the left hip(s). The bone quality appears to be good for age and reported activity  level.   Preoperative templating of the joint replacement has been completed, documented, and submitted to the Operating Room personnel in order to optimize intra-operative equipment management.   Assessment/Plan:  End stage primary arthritis, left hip(s)  The patient history, physical examination, clinical judgement of the provider and imaging studies are consistent with end stage degenerative joint disease of the left hip(s) and total hip arthroplasty is deemed medically necessary. The treatment options including medical management, injection therapy, arthroscopy and arthroplasty were discussed at length. The risks and benefits of total hip arthroplasty were presented and reviewed. The risks due to aseptic loosening, infection, stiffness, dislocation/subluxation,  thromboembolic complications and other imponderables were discussed.  The patient acknowledged the explanation, agreed to proceed with the plan and consent was signed. Patient is being admitted for inpatient treatment for surgery, pain control, PT, OT, prophylactic antibiotics, VTE prophylaxis, progressive ambulation and ADL's and discharge planning.The patient is planning to be discharged home with home health services

## 2017-11-25 ENCOUNTER — Encounter (HOSPITAL_COMMUNITY)
Admission: RE | Disposition: A | Discharge status: Home Health | Payer: Self-pay | Source: Ambulatory Visit | Attending: Orthopaedic Surgery

## 2017-11-25 ENCOUNTER — Other Ambulatory Visit: Payer: Self-pay

## 2017-11-25 ENCOUNTER — Inpatient Hospital Stay (HOSPITAL_COMMUNITY)
Admission: RE | Admit: 2017-11-25 | Discharge: 2017-11-27 | DRG: 470 | Disposition: A | Discharge status: Home Health | Payer: Medicare Other | Source: Ambulatory Visit | Attending: Orthopaedic Surgery | Admitting: Orthopaedic Surgery

## 2017-11-25 ENCOUNTER — Inpatient Hospital Stay (HOSPITAL_COMMUNITY): Payer: Medicare Other

## 2017-11-25 ENCOUNTER — Inpatient Hospital Stay (HOSPITAL_COMMUNITY): Payer: Medicare Other | Admitting: Physician Assistant

## 2017-11-25 ENCOUNTER — Encounter (HOSPITAL_COMMUNITY): Payer: Self-pay

## 2017-11-25 ENCOUNTER — Inpatient Hospital Stay (HOSPITAL_COMMUNITY): Payer: Medicare Other | Admitting: Anesthesiology

## 2017-11-25 DIAGNOSIS — G4730 Sleep apnea, unspecified: Secondary | ICD-10-CM | POA: Diagnosis present

## 2017-11-25 DIAGNOSIS — F419 Anxiety disorder, unspecified: Secondary | ICD-10-CM | POA: Diagnosis present

## 2017-11-25 DIAGNOSIS — M1612 Unilateral primary osteoarthritis, left hip: Secondary | ICD-10-CM | POA: Diagnosis not present

## 2017-11-25 DIAGNOSIS — Z888 Allergy status to other drugs, medicaments and biological substances status: Secondary | ICD-10-CM | POA: Diagnosis not present

## 2017-11-25 DIAGNOSIS — M810 Age-related osteoporosis without current pathological fracture: Secondary | ICD-10-CM | POA: Diagnosis not present

## 2017-11-25 DIAGNOSIS — M8580 Other specified disorders of bone density and structure, unspecified site: Secondary | ICD-10-CM | POA: Diagnosis present

## 2017-11-25 DIAGNOSIS — Z79899 Other long term (current) drug therapy: Secondary | ICD-10-CM | POA: Diagnosis not present

## 2017-11-25 DIAGNOSIS — K5090 Crohn's disease, unspecified, without complications: Secondary | ICD-10-CM | POA: Diagnosis not present

## 2017-11-25 DIAGNOSIS — R402140 Coma scale, eyes open, spontaneous, unspecified time: Secondary | ICD-10-CM | POA: Diagnosis present

## 2017-11-25 DIAGNOSIS — Z23 Encounter for immunization: Secondary | ICD-10-CM

## 2017-11-25 DIAGNOSIS — Z9989 Dependence on other enabling machines and devices: Secondary | ICD-10-CM

## 2017-11-25 DIAGNOSIS — Z885 Allergy status to narcotic agent status: Secondary | ICD-10-CM

## 2017-11-25 DIAGNOSIS — F329 Major depressive disorder, single episode, unspecified: Secondary | ICD-10-CM | POA: Diagnosis present

## 2017-11-25 DIAGNOSIS — D649 Anemia, unspecified: Secondary | ICD-10-CM | POA: Diagnosis not present

## 2017-11-25 DIAGNOSIS — R402250 Coma scale, best verbal response, oriented, unspecified time: Secondary | ICD-10-CM | POA: Diagnosis not present

## 2017-11-25 DIAGNOSIS — Z419 Encounter for procedure for purposes other than remedying health state, unspecified: Secondary | ICD-10-CM

## 2017-11-25 DIAGNOSIS — Z96611 Presence of right artificial shoulder joint: Secondary | ICD-10-CM | POA: Diagnosis present

## 2017-11-25 DIAGNOSIS — Z791 Long term (current) use of non-steroidal anti-inflammatories (NSAID): Secondary | ICD-10-CM | POA: Diagnosis not present

## 2017-11-25 DIAGNOSIS — K219 Gastro-esophageal reflux disease without esophagitis: Secondary | ICD-10-CM | POA: Diagnosis present

## 2017-11-25 DIAGNOSIS — Z87891 Personal history of nicotine dependence: Secondary | ICD-10-CM

## 2017-11-25 DIAGNOSIS — Z9181 History of falling: Secondary | ICD-10-CM

## 2017-11-25 DIAGNOSIS — E538 Deficiency of other specified B group vitamins: Secondary | ICD-10-CM | POA: Diagnosis present

## 2017-11-25 DIAGNOSIS — M797 Fibromyalgia: Secondary | ICD-10-CM | POA: Diagnosis present

## 2017-11-25 DIAGNOSIS — R402360 Coma scale, best motor response, obeys commands, unspecified time: Secondary | ICD-10-CM | POA: Diagnosis not present

## 2017-11-25 DIAGNOSIS — Z7982 Long term (current) use of aspirin: Secondary | ICD-10-CM

## 2017-11-25 HISTORY — DX: Other specified health status: Z78.9

## 2017-11-25 HISTORY — PX: TOTAL HIP ARTHROPLASTY: SHX124

## 2017-11-25 LAB — GLUCOSE, CAPILLARY
Glucose-Capillary: 96 mg/dL (ref 70–99)
Glucose-Capillary: 90 mg/dL (ref 70–99)

## 2017-11-25 SURGERY — ARTHROPLASTY, HIP, TOTAL, ANTERIOR APPROACH
Anesthesia: Spinal | Site: Hip | Laterality: Left

## 2017-11-25 MED ORDER — METHOCARBAMOL 500 MG PO TABS
ORAL_TABLET | ORAL | Status: AC
  Filled 2017-11-25: qty 1

## 2017-11-25 MED ORDER — MENTHOL 3 MG MT LOZG: 1.0000 | LOZENGE | OROMUCOSAL | Status: DC | PRN

## 2017-11-25 MED ORDER — DIPHENHYDRAMINE HCL 12.5 MG/5ML PO ELIX: 12.5000 mg | ORAL_SOLUTION | ORAL | Status: DC | PRN

## 2017-11-25 MED ORDER — FENTANYL CITRATE (PF) 250 MCG/5ML IJ SOLN
INTRAMUSCULAR | Status: AC
  Filled 2017-11-25: qty 5

## 2017-11-25 MED ORDER — PANTOPRAZOLE SODIUM 40 MG PO TBEC
80.0000 mg | DELAYED_RELEASE_TABLET | Freq: Every day | ORAL | Status: DC
  Administered 2017-11-26 – 2017-11-27 (×2): 80 mg via ORAL
  Filled 2017-11-25 (×2): qty 2

## 2017-11-25 MED ORDER — LACTATED RINGERS IV SOLN
INTRAVENOUS | Status: DC
  Administered 2017-11-25 – 2017-11-27 (×2): via INTRAVENOUS

## 2017-11-25 MED ORDER — SODIUM CHLORIDE 0.9 % IV SOLN
INTRAVENOUS | Status: DC | PRN
  Administered 2017-11-25: 30 ug/min via INTRAVENOUS

## 2017-11-25 MED ORDER — LACTATED RINGERS IV SOLN
INTRAVENOUS | Status: DC
  Administered 2017-11-25 (×3): via INTRAVENOUS

## 2017-11-25 MED ORDER — KETOROLAC TROMETHAMINE 15 MG/ML IJ SOLN
INTRAMUSCULAR | Status: AC
  Administered 2017-11-25: 7.5 mg via INTRAVENOUS
  Filled 2017-11-25: qty 1

## 2017-11-25 MED ORDER — ACETAMINOPHEN 500 MG PO TABS
1000.0000 mg | ORAL_TABLET | Freq: Four times a day (QID) | ORAL | Status: AC
  Administered 2017-11-25 – 2017-11-26 (×3): 1000 mg via ORAL
  Filled 2017-11-25 (×3): qty 2

## 2017-11-25 MED ORDER — CHLORHEXIDINE GLUCONATE 4 % EX LIQD: 60.0000 mL | Freq: Once | CUTANEOUS | Status: DC

## 2017-11-25 MED ORDER — SODIUM CHLORIDE 0.9 % IV SOLN
INTRAVENOUS | Status: DC | PRN
  Administered 2017-11-25: 80 ug/min via INTRAVENOUS

## 2017-11-25 MED ORDER — EPHEDRINE SULFATE 50 MG/ML IJ SOLN
INTRAMUSCULAR | Status: DC | PRN
  Administered 2017-11-25 (×2): 5 mg via INTRAVENOUS

## 2017-11-25 MED ORDER — METHOCARBAMOL 500 MG PO TABS
500.0000 mg | ORAL_TABLET | Freq: Four times a day (QID) | ORAL | Status: DC | PRN
  Administered 2017-11-25: 500 mg via ORAL

## 2017-11-25 MED ORDER — FENTANYL CITRATE (PF) 250 MCG/5ML IJ SOLN
INTRAMUSCULAR | Status: DC | PRN
  Administered 2017-11-25: 50 ug via INTRAVENOUS

## 2017-11-25 MED ORDER — BUPIVACAINE-EPINEPHRINE (PF) 0.25% -1:200000 IJ SOLN
INTRAMUSCULAR | Status: DC | PRN
  Administered 2017-11-25: 30 mL via PERINEURAL

## 2017-11-25 MED ORDER — CEFAZOLIN SODIUM-DEXTROSE 2-4 GM/100ML-% IV SOLN
2.0000 g | Freq: Four times a day (QID) | INTRAVENOUS | Status: AC
  Administered 2017-11-25: 2 g via INTRAVENOUS
  Filled 2017-11-25 (×2): qty 100

## 2017-11-25 MED ORDER — DOCUSATE SODIUM 100 MG PO CAPS
100.0000 mg | ORAL_CAPSULE | Freq: Two times a day (BID) | ORAL | Status: DC
  Administered 2017-11-25 – 2017-11-27 (×4): 100 mg via ORAL
  Filled 2017-11-25 (×4): qty 1

## 2017-11-25 MED ORDER — OXYCODONE HCL 5 MG PO TABS: 5.0000 mg | ORAL_TABLET | Freq: Once | ORAL | Status: DC | PRN

## 2017-11-25 MED ORDER — PROMETHAZINE HCL 25 MG/ML IJ SOLN: 6.2500 mg | Freq: Four times a day (QID) | INTRAMUSCULAR | Status: DC | PRN

## 2017-11-25 MED ORDER — LIDOCAINE 2% (20 MG/ML) 5 ML SYRINGE
INTRAMUSCULAR | Status: DC | PRN
  Administered 2017-11-25: 30 mg via INTRAVENOUS

## 2017-11-25 MED ORDER — ACETAMINOPHEN 325 MG PO TABS: 325.0000 mg | ORAL_TABLET | Freq: Four times a day (QID) | ORAL | Status: DC | PRN

## 2017-11-25 MED ORDER — HYDROMORPHONE HCL 1 MG/ML IJ SOLN: 0.2500 mg | INTRAMUSCULAR | Status: DC | PRN

## 2017-11-25 MED ORDER — CEFAZOLIN SODIUM-DEXTROSE 2-4 GM/100ML-% IV SOLN
2.0000 g | INTRAVENOUS | Status: AC
  Administered 2017-11-25: 2 g via INTRAVENOUS

## 2017-11-25 MED ORDER — OXYCODONE HCL 5 MG PO TABS
ORAL_TABLET | ORAL | Status: AC
  Filled 2017-11-25: qty 2

## 2017-11-25 MED ORDER — METOCLOPRAMIDE HCL 5 MG PO TABS: 5.0000 mg | ORAL_TABLET | Freq: Three times a day (TID) | ORAL | Status: DC | PRN

## 2017-11-25 MED ORDER — BUPIVACAINE IN DEXTROSE 0.75-8.25 % IT SOLN
INTRATHECAL | Status: DC | PRN
  Administered 2017-11-25: 2 mL via INTRATHECAL

## 2017-11-25 MED ORDER — OXYCODONE HCL 5 MG PO TABS
10.0000 mg | ORAL_TABLET | ORAL | Status: DC | PRN
  Administered 2017-11-25: 10 mg via ORAL
  Filled 2017-11-25: qty 2

## 2017-11-25 MED ORDER — OXYCODONE HCL 5 MG PO TABS
5.0000 mg | ORAL_TABLET | ORAL | Status: DC | PRN
  Administered 2017-11-27 (×2): 10 mg via ORAL
  Filled 2017-11-25 (×2): qty 2

## 2017-11-25 MED ORDER — PROMETHAZINE HCL 25 MG PO TABS: 12.5000 mg | ORAL_TABLET | Freq: Four times a day (QID) | ORAL | Status: DC | PRN

## 2017-11-25 MED ORDER — PROPOFOL 500 MG/50ML IV EMUL
INTRAVENOUS | Status: DC | PRN
  Administered 2017-11-25: 30 ug/kg/min via INTRAVENOUS

## 2017-11-25 MED ORDER — ASPIRIN EC 325 MG PO TBEC
325.0000 mg | DELAYED_RELEASE_TABLET | Freq: Two times a day (BID) | ORAL | Status: DC
  Administered 2017-11-26 – 2017-11-27 (×3): 325 mg via ORAL
  Filled 2017-11-25 (×3): qty 1

## 2017-11-25 MED ORDER — MIDAZOLAM HCL 2 MG/2ML IJ SOLN
INTRAMUSCULAR | Status: AC
  Filled 2017-11-25: qty 2

## 2017-11-25 MED ORDER — DEXAMETHASONE SODIUM PHOSPHATE 10 MG/ML IJ SOLN
INTRAMUSCULAR | Status: DC | PRN
  Administered 2017-11-25: 5 mg via INTRAVENOUS

## 2017-11-25 MED ORDER — MIDAZOLAM HCL 5 MG/5ML IJ SOLN
INTRAMUSCULAR | Status: DC | PRN
  Administered 2017-11-25: 1 mg via INTRAVENOUS

## 2017-11-25 MED ORDER — ESCITALOPRAM OXALATE 10 MG PO TABS
5.0000 mg | ORAL_TABLET | Freq: Every day | ORAL | Status: DC
  Administered 2017-11-25 – 2017-11-26 (×2): 5 mg via ORAL
  Filled 2017-11-25 (×2): qty 1

## 2017-11-25 MED ORDER — KETOROLAC TROMETHAMINE 15 MG/ML IJ SOLN
7.5000 mg | Freq: Four times a day (QID) | INTRAMUSCULAR | Status: AC
  Administered 2017-11-25 – 2017-11-26 (×3): 7.5 mg via INTRAVENOUS
  Filled 2017-11-25 (×3): qty 1

## 2017-11-25 MED ORDER — TRANEXAMIC ACID 1000 MG/10ML IV SOLN
1000.0000 mg | Freq: Once | INTRAVENOUS | Status: DC
  Filled 2017-11-25: qty 10

## 2017-11-25 MED ORDER — OXYBUTYNIN CHLORIDE ER 10 MG PO TB24
10.0000 mg | ORAL_TABLET | Freq: Every day | ORAL | Status: DC
  Administered 2017-11-25 – 2017-11-26 (×2): 10 mg via ORAL
  Filled 2017-11-25 (×2): qty 1

## 2017-11-25 MED ORDER — DIPHENHYDRAMINE HCL 50 MG/ML IJ SOLN
INTRAMUSCULAR | Status: DC | PRN
  Administered 2017-11-25: 12.5 mg via INTRAVENOUS

## 2017-11-25 MED ORDER — PROMETHAZINE HCL 25 MG/ML IJ SOLN: 6.2500 mg | INTRAMUSCULAR | Status: DC | PRN

## 2017-11-25 MED ORDER — ALUM & MAG HYDROXIDE-SIMETH 200-200-20 MG/5ML PO SUSP: 30.0000 mL | ORAL | Status: DC | PRN

## 2017-11-25 MED ORDER — BUPIVACAINE-EPINEPHRINE (PF) 0.25% -1:200000 IJ SOLN
INTRAMUSCULAR | Status: AC
  Filled 2017-11-25: qty 30

## 2017-11-25 MED ORDER — METOCLOPRAMIDE HCL 5 MG/ML IJ SOLN: 5.0000 mg | Freq: Three times a day (TID) | INTRAMUSCULAR | Status: DC | PRN

## 2017-11-25 MED ORDER — 0.9 % SODIUM CHLORIDE (POUR BTL) OPTIME
TOPICAL | Status: DC | PRN
  Administered 2017-11-25: 1000 mL

## 2017-11-25 MED ORDER — PHENOL 1.4 % MT LIQD: 1.0000 | OROMUCOSAL | Status: DC | PRN

## 2017-11-25 MED ORDER — HYDROMORPHONE HCL 1 MG/ML IJ SOLN: 0.5000 mg | INTRAMUSCULAR | Status: DC | PRN

## 2017-11-25 MED ORDER — ONDANSETRON HCL 4 MG/2ML IJ SOLN
4.0000 mg | Freq: Four times a day (QID) | INTRAMUSCULAR | Status: DC | PRN
  Administered 2017-11-26 – 2017-11-27 (×2): 4 mg via INTRAVENOUS
  Filled 2017-11-25 (×2): qty 2

## 2017-11-25 MED ORDER — CEFAZOLIN SODIUM-DEXTROSE 2-4 GM/100ML-% IV SOLN
INTRAVENOUS | Status: AC
  Filled 2017-11-25: qty 100

## 2017-11-25 MED ORDER — OXYCODONE HCL 5 MG/5ML PO SOLN: 5.0000 mg | Freq: Once | ORAL | Status: DC | PRN

## 2017-11-25 MED ORDER — ONDANSETRON HCL 4 MG PO TABS: 4.0000 mg | ORAL_TABLET | Freq: Four times a day (QID) | ORAL | Status: DC | PRN

## 2017-11-25 MED ORDER — INFLUENZA VAC SPLIT HIGH-DOSE 0.5 ML IM SUSY
0.5000 mL | PREFILLED_SYRINGE | INTRAMUSCULAR | Status: AC
  Administered 2017-11-27: 0.5 mL via INTRAMUSCULAR
  Filled 2017-11-25 (×2): qty 0.5

## 2017-11-25 MED ORDER — METHOCARBAMOL 1000 MG/10ML IJ SOLN
500.0000 mg | Freq: Four times a day (QID) | INTRAVENOUS | Status: DC | PRN
  Filled 2017-11-25: qty 5

## 2017-11-25 MED ORDER — BISACODYL 5 MG PO TBEC: 5.0000 mg | DELAYED_RELEASE_TABLET | Freq: Every day | ORAL | Status: DC | PRN

## 2017-11-25 MED ORDER — EPHEDRINE 5 MG/ML INJ
INTRAVENOUS | Status: AC
  Filled 2017-11-25: qty 10

## 2017-11-25 MED ORDER — MESALAMINE ER 250 MG PO CPCR
2000.0000 mg | ORAL_CAPSULE | Freq: Two times a day (BID) | ORAL | Status: DC
  Administered 2017-11-25 – 2017-11-27 (×4): 2000 mg via ORAL
  Filled 2017-11-25 (×4): qty 8

## 2017-11-25 SURGICAL SUPPLY — 45 items
BLADE SAW SGTL 18X1.27X75 (BLADE) ×2 IMPLANT
BLADE SAW SGTL 18X1.27X75MM (BLADE) ×1
CELLS DAT CNTRL 66122 CELL SVR (MISCELLANEOUS) ×1 IMPLANT
COVER PERINEAL POST (MISCELLANEOUS) ×3 IMPLANT
COVER SURGICAL LIGHT HANDLE (MISCELLANEOUS) ×3 IMPLANT
CUP GRIPTON 48MM 100 HIP (Hips) ×3 IMPLANT
DRAPE C-ARM 42X72 X-RAY (DRAPES) ×3 IMPLANT
DRAPE STERI IOBAN 125X83 (DRAPES) ×3 IMPLANT
DRAPE U-SHAPE 47X51 STRL (DRAPES) ×9 IMPLANT
DRSG AQUACEL AG ADV 3.5X10 (GAUZE/BANDAGES/DRESSINGS) ×3 IMPLANT
DRSG OPSITE POSTOP 4X6 (GAUZE/BANDAGES/DRESSINGS) ×3 IMPLANT
DURAPREP 26ML APPLICATOR (WOUND CARE) ×3 IMPLANT
ELECT BLADE 4.0 EZ CLEAN MEGAD (MISCELLANEOUS) ×3
ELECT REM PT RETURN 9FT ADLT (ELECTROSURGICAL) ×3
ELECTRODE BLDE 4.0 EZ CLN MEGD (MISCELLANEOUS) ×1 IMPLANT
ELECTRODE REM PT RTRN 9FT ADLT (ELECTROSURGICAL) ×1 IMPLANT
ELIMINATOR HOLE APEX DEPUY (Hips) ×3 IMPLANT
FACESHIELD WRAPAROUND (MASK) ×6 IMPLANT
GLOVE BIO SURGEON STRL SZ8 (GLOVE) ×6 IMPLANT
GLOVE BIOGEL PI IND STRL 8 (GLOVE) ×2 IMPLANT
GLOVE BIOGEL PI INDICATOR 8 (GLOVE) ×4
GOWN STRL REUS W/ TWL LRG LVL3 (GOWN DISPOSABLE) ×1 IMPLANT
GOWN STRL REUS W/ TWL XL LVL3 (GOWN DISPOSABLE) ×2 IMPLANT
GOWN STRL REUS W/TWL LRG LVL3 (GOWN DISPOSABLE) ×2
GOWN STRL REUS W/TWL XL LVL3 (GOWN DISPOSABLE) ×4
HEAD FEMORAL 32 CERAMIC (Hips) ×3 IMPLANT
KIT BASIN OR (CUSTOM PROCEDURE TRAY) ×3 IMPLANT
KIT TURNOVER KIT B (KITS) ×3 IMPLANT
LINER ACET 32X48 (Liner) ×3 IMPLANT
MANIFOLD NEPTUNE II (INSTRUMENTS) ×3 IMPLANT
NEEDLE HYPO 22GX1.5 SAFETY (NEEDLE) ×3 IMPLANT
NS IRRIG 1000ML POUR BTL (IV SOLUTION) ×3 IMPLANT
PACK TOTAL JOINT (CUSTOM PROCEDURE TRAY) ×3 IMPLANT
PAD ARMBOARD 7.5X6 YLW CONV (MISCELLANEOUS) ×3 IMPLANT
RTRCTR WOUND ALEXIS 18CM MED (MISCELLANEOUS) ×3
STEM CORAIL KA12 (Stem) ×3 IMPLANT
SUT ETHIBOND NAB CT1 #1 30IN (SUTURE) ×6 IMPLANT
SUT VIC AB 1 CT1 27 (SUTURE) ×2
SUT VIC AB 1 CT1 27XBRD ANBCTR (SUTURE) ×1 IMPLANT
SUT VIC AB 2-0 CT1 27 (SUTURE) ×2
SUT VIC AB 2-0 CT1 TAPERPNT 27 (SUTURE) ×1 IMPLANT
SUT VIC AB 3-0 PS2 18 (SUTURE) ×2
SUT VIC AB 3-0 PS2 18XBRD (SUTURE) ×1 IMPLANT
SUT VLOC 180 0 24IN GS25 (SUTURE) ×3 IMPLANT
SYR 50ML LL SCALE MARK (SYRINGE) ×3 IMPLANT

## 2017-11-25 NOTE — Evaluation (Signed)
Physical Therapy Evaluation Patient Details Name: Kimberly Andrade MRN: [id number] DOB: 1946-02-27 Today's Date: 11/25/2017   History of Present Illness  Pt is a 72 y/o female s/p L THA, direct anterior approach. PMH includes fibromyalgia, pre-diabetes, and sleep apnea on CPAP.   Clinical Impression  Pt is s/p surgery above with deficits below. Pt was extremely limited by pain this session and distance limited secondary to pain. Required min to min guard A for mobility using RW this session. Educated about supine HEP. Anticipate pt will progress well once pain controlled. Will continue to follow acutely to maximize functional mobility independence and safety.     Follow Up Recommendations Follow surgeon's recommendation for DC plan and follow-up therapies;Supervision for mobility/OOB    Equipment Recommendations  None recommended by PT    Recommendations for Other Services OT consult     Precautions / Restrictions Precautions Precautions: Fall Restrictions Weight Bearing Restrictions: No      Mobility  Bed Mobility Overal bed mobility: Needs Assistance Bed Mobility: Supine to Sit     Supine to sit: Min assist     General bed mobility comments: Min A for trunk elevation and LLE managment. Very slow movement secondary to pain.   Transfers Overall transfer level: Needs assistance Equipment used: Rolling walker (2 wheeled) Transfers: Sit to/from Stand Sit to Stand: Min assist         General transfer comment: Min A for lift assist and steadying. Verbal cues for safe hand placement.   Ambulation/Gait Ambulation/Gait assistance: Min guard Gait Distance (Feet): 5 Feet Assistive device: Rolling walker (2 wheeled) Gait Pattern/deviations: Step-to pattern;Decreased step length - right;Decreased step length - left;Decreased weight shift to left;Antalgic Gait velocity: Decreased    General Gait Details: Very slow, antalgic gait. Pt reports increased pain so distance  limited to chair. REquired verbal cues for sequencing using RW.   Stairs            Wheelchair Mobility    Modified Rankin (Stroke Patients Only)       Balance Overall balance assessment: Needs assistance Sitting-balance support: No upper extremity supported;Feet supported Sitting balance-Leahy Scale: Fair     Standing balance support: Bilateral upper extremity supported;During functional activity Standing balance-Leahy Scale: Poor Standing balance comment: Required BUE support.                              Pertinent Vitals/Pain Pain Assessment: Faces Faces Pain Scale: Hurts whole lot Pain Location: L hip  Pain Descriptors / Indicators: Aching;Operative site guarding Pain Intervention(s): Limited activity within patient's tolerance;Monitored during session;Repositioned    Home Living Family/patient expects to be discharged to:: Private residence Living Arrangements: Spouse/significant other Available Help at Discharge: Family;Available 24 hours/day Type of Home: House Home Access: Stairs to enter Entrance Stairs-Rails: None Entrance Stairs-Number of Steps: 3 Home Layout: One level Home Equipment: Walker - 2 wheels;Bedside commode;Cane - single point      Prior Function Level of Independence: Independent with assistive device(s)         Comments: Used cane for ambulation.      Hand Dominance        Extremity/Trunk Assessment   Upper Extremity Assessment Upper Extremity Assessment: Defer to OT evaluation    Lower Extremity Assessment Lower Extremity Assessment: LLE deficits/detail LLE Deficits / Details: Sensory in tact. Deficits consistent with post op pain and weakness. Able to perform ther ex below.     Cervical / Trunk  Assessment Cervical / Trunk Assessment: Normal  Communication   Communication: No difficulties  Cognition Arousal/Alertness: Awake/alert Behavior During Therapy: WFL for tasks assessed/performed Overall Cognitive  Status: Within Functional Limits for tasks assessed                                        General Comments General comments (skin integrity, edema, etc.): Pt's husband present at beginning of session.     Exercises Total Joint Exercises Ankle Circles/Pumps: AROM;Both;20 reps Quad Sets: AROM;Left;10 reps   Assessment/Plan    PT Assessment Patient needs continued PT services  PT Problem List Decreased strength;Decreased range of motion;Decreased balance;Decreased activity tolerance;Decreased mobility;Decreased knowledge of use of DME;Decreased knowledge of precautions;Pain       PT Treatment Interventions DME instruction;Gait training;Stair training;Functional mobility training;Therapeutic activities;Balance training;Therapeutic exercise;Patient/family education    PT Goals (Current goals can be found in the Care Plan section)  Acute Rehab PT Goals Patient Stated Goal: to decrease pain  PT Goal Formulation: With patient Time For Goal Achievement: 12/09/17 Potential to Achieve Goals: Good    Frequency 7X/week   Barriers to discharge        Co-evaluation               AM-PAC PT "6 Clicks" Daily Activity  Outcome Measure Difficulty turning over in bed (including adjusting bedclothes, sheets and blankets)?: A Lot Difficulty moving from lying on back to sitting on the side of the bed? : Unable Difficulty sitting down on and standing up from a chair with arms (e.g., wheelchair, bedside commode, etc,.)?: Unable Help needed moving to and from a bed to chair (including a wheelchair)?: A Little Help needed walking in hospital room?: A Little Help needed climbing 3-5 steps with a railing? : A Lot 6 Click Score: 12    End of Session Equipment Utilized During Treatment: Gait belt Activity Tolerance: Patient limited by pain Patient left: in bed;with call bell/phone within reach Nurse Communication: Mobility status PT Visit Diagnosis: Other abnormalities of  gait and mobility (R26.89);Pain;Unsteadiness on feet (R26.81);Muscle weakness (generalized) (M62.81) Pain - Right/Left: Left Pain - part of body: Hip    Time: 4628-6381 PT Time Calculation (min) (ACUTE ONLY): 25 min   Charges:   PT Evaluation $PT Eval Low Complexity: 1 Low PT Treatments $Therapeutic Activity: 8-22 mins        Leighton Ruff, PT, DPT  Acute Rehabilitation Services  Pager: [phone] Office: [phone]   Rudean Hitt 11/25/2017, 5:36 PM

## 2017-11-25 NOTE — Plan of Care (Signed)
  Problem: Education: Goal: Knowledge of General Education information will improve Description Including pain rating scale, medication(s)/side effects and non-pharmacologic comfort measures Outcome: Progressing   

## 2017-11-25 NOTE — Anesthesia Procedure Notes (Signed)
Procedure Name: MAC Date/Time: 11/25/2017 10:58 AM Performed by: Mariea Clonts, CRNA Pre-anesthesia Checklist: Patient identified, Emergency Drugs available, Suction available, Timeout performed and Patient being monitored Patient Re-evaluated:Patient Re-evaluated prior to induction Oxygen Delivery Method: Nasal cannula

## 2017-11-25 NOTE — Anesthesia Postprocedure Evaluation (Signed)
Anesthesia Post Note  Patient: Kimberly Andrade  Procedure(s) Performed: LEFT TOTAL HIP ARTHROPLASTY ANTERIOR APPROACH (Left Hip)     Patient location during evaluation: PACU Anesthesia Type: Spinal Level of consciousness: oriented and awake and alert Pain management: pain level controlled Vital Signs Assessment: post-procedure vital signs reviewed and stable Respiratory status: spontaneous breathing and respiratory function stable Cardiovascular status: blood pressure returned to baseline and stable Postop Assessment: no headache, no backache and no apparent nausea or vomiting Anesthetic complications: no    Last Vitals:  Vitals:   11/25/17 1343 11/25/17 1345  BP:    Pulse: (!) 51 (!) 57  Resp: 15 15  Temp:    SpO2: 96% 98%    Last Pain:  Vitals:   11/25/17 1324  PainSc: 2                  Lynda Rainwater

## 2017-11-25 NOTE — Care Plan (Signed)
Spoke with patient prior to surgery. She is planning to discharge to home with family and the following arrangements.   DME:  Has walker - 3n1 ordered for delivery to hospital room from [employer].  HHPT:  [facility]  OPPT: [facility] - 12/08/17 @ 100  MD Follow up:   12/05/17 @ 915   I have spoke with patient and she is agreeable to this plan.   Please contact Ladell Heads, [place] with questions or if this plan should change   Thanks

## 2017-11-25 NOTE — Transfer of Care (Signed)
Immediate Anesthesia Transfer of Care Note  Patient: Kimberly Andrade  Procedure(s) Performed: LEFT TOTAL HIP ARTHROPLASTY ANTERIOR APPROACH (Left Hip)  Patient Location: PACU  Anesthesia Type:Spinal  Level of Consciousness: awake, alert  and oriented  Airway & Oxygen Therapy: Patient Spontanous Breathing and Patient connected to nasal cannula oxygen  Post-op Assessment: Report given to RN and Post -op Vital signs reviewed and stable  Post vital signs: Reviewed and stable  Last Vitals:  Vitals Value Taken Time  BP 83/66 11/25/2017 12:51 PM  Temp    Pulse 49 11/25/2017 12:52 PM  Resp 8 11/25/2017 12:52 PM  SpO2 99 % 11/25/2017 12:52 PM  Vitals shown include unvalidated device data.  Last Pain:  Vitals:   11/25/17 0840  PainSc: 8       Patients Stated Pain Goal: 3 (35/78/97 8478)  Complications: No apparent anesthesia complications

## 2017-11-25 NOTE — Interval H&P Note (Signed)
History and Physical Interval Note:  11/25/2017 9:01 AM  Kimberly Andrade  has presented today for surgery, with the diagnosis of LEFT HIP DEGENERATIVE JOINT DISEASE  The various methods of treatment have been discussed with the patient and family. After consideration of risks, benefits and other options for treatment, the patient has consented to  Procedure(s): LEFT TOTAL HIP ARTHROPLASTY ANTERIOR APPROACH (Left) as a surgical intervention .  The patient's history has been reviewed, patient examined, no change in status, stable for surgery.  I have reviewed the patient's chart and labs.  Questions were answered to the patient's satisfaction.     Cristy Colmenares G

## 2017-11-25 NOTE — Op Note (Signed)
PRE-OP DIAGNOSIS:  LEFT HIP DEGENERATIVE JOINT DISEASE POST-OP DIAGNOSIS: same PROCEDURE:  LEFT TOTAL HIP ARTHROPLASTY ANTERIOR APPROACH ANESTHESIA:  Spinal and MAC SURGEON:  Melrose Nakayama MD ASSISTANT:  Loni Dolly PA-C   INDICATIONS FOR PROCEDURE:  The patient is a 72 y.o. female with a long history of a painful hip.  This has persisted despite multiple conservative measures.  The patient has persisted with pain and dysfunction making rest and activity difficult.  A total hip replacement is offered as surgical treatment.  Informed operative consent was obtained after discussion of possible complications including reaction to anesthesia, infection, neurovascular injury, dislocation, DVT, PE, and death.  The importance of the postoperative rehab program to optimize result was stressed with the patient.  SUMMARY OF FINDINGS AND PROCEDURE:  Under the above anesthesia through a anterior approach an the Hana table a left THR was performed.  The patient had severe degenerative change and good bone quality.  We used DePuy components to replace the hip and these were size KA 12 Corail femur capped with a +1 22m ceramic hip ball.  On the acetabular side we used a size 48 Gription shell with a plus 0 neutral polyethylene liner.  We did use a hole eliminator.  ALoni DollyPA-C assisted throughout and was invaluable to the completion of the case in that he helped position and retract while I performed the procedure.  He also closed simultaneously to help minimize OR time.  I used fluoroscopy throughout the case to check position of components and leg lengths and read all these views myself.  DESCRIPTION OF PROCEDURE:  The patient was taken to the OR suite where the above anesthetic was applied.  The patient was then positioned on the Hana table supine.  All bony prominences were appropriately padded.  Prep and drape was then performed in normal sterile fashion.  The patient was given kefzol preoperative  antibiotic and an appropriate time out was performed.  We then took an anterior approach to the left hip.  Dissection was taken through adipose to the tensor fascia lata fascia.  This structure was incised longitudinally and we dissected in the intermuscular interval just medial to this muscle.  Cobra retractors were placed superior and inferior to the femoral neck superficial to the capsule.  A capsular incision was then made and the retractors were placed along the femoral neck.  Xray was brought in to get a good level for the femoral neck cut which was made with an oscillating saw and osteotome.  The femoral head was removed with a corkscrew.  The acetabulum was exposed and some labral tissues were excised. Reaming was taken to the inside wall of the pelvis and sequentially up to 1 mm smaller than the actual component.  A trial of components was done and then the aforementioned acetabular shell was placed in appropriate tilt and anteversion confirmed by fluoroscopy. The liner was placed along with the hole eliminator and attention was turned to the femur.  The leg was brought down and over into adduction and the elevator bar was used to raise the femur up gently in the wound.  The piriformis was released with care taken to preserve the obturator internus attachment and all of the posterior capsule. The femur was reamed and then broached to the appropriate size.  A trial reduction was done and the aforementioned head and neck assembly gave uKoreathe best stability in extension with external rotation.  Leg lengths were felt to be about equal by  fluoroscopic exam.  The trial components were removed and the wound irrigated.  We then placed the femoral component in appropriate anteversion.  The head was applied to a dry stem neck and the hip again reduced.  It was again stable in the aforementioned position.  The would was irrigated again followed by re-approximation of anterior capsule with ethibond suture. Tensor  fascia was repaired with V-loc suture  followed by deep closure with #O and #2 undyed vicryl.  Skin was closed with subQ stitch and steristrips followed by a sterile dressing.  EBL and IOF can be obtained from anesthesia records.  DISPOSITION:  The patient was extubated in the OR and taken to PACU in stable condition to be admitted to the Orthopedic Surgery for appropriate post-op care to include perioperative antibiotics and DVT prophylaxis.

## 2017-11-25 NOTE — Anesthesia Procedure Notes (Signed)
Spinal  Patient location during procedure: OR Start time: 11/25/2017 10:50 AM End time: 11/25/2017 10:55 AM Staffing Anesthesiologist: Lynda Rainwater, MD Performed: anesthesiologist  Preanesthetic Checklist Completed: patient identified, site marked, surgical consent, pre-op evaluation, timeout performed, IV checked, risks and benefits discussed and monitors and equipment checked Spinal Block Patient position: sitting Prep: Betadine Patient monitoring: heart rate, cardiac monitor, continuous pulse ox and blood pressure Approach: midline Injection technique: single-shot Needle Needle type: Quincke  Needle gauge: 22 G Needle length: 9 cm

## 2017-11-25 NOTE — Progress Notes (Signed)
Dr Sabra Heck aware BP 74-83/ 46-64, SB 48, pt awake, alert, no c/o. No new orders-will continue to monitor.

## 2017-11-25 NOTE — Anesthesia Preprocedure Evaluation (Addendum)
Anesthesia Evaluation  Patient identified by MRN, date of birth, ID band Patient awake    Reviewed: Allergy & Precautions, NPO status , Patient's Chart, lab work & pertinent test results  History of Anesthesia Complications Negative for: history of anesthetic complications  Airway Mallampati: II  TM Distance: >3 FB Neck ROM: Full    Dental no notable dental hx. (+) Upper Dentures, Lower Dentures, Dental Advidsory Given   Pulmonary sleep apnea, Continuous Positive Airway Pressure Ventilation and Oxygen sleep apnea , neg COPD, former smoker,    Pulmonary exam normal breath sounds clear to auscultation       Cardiovascular Exercise Tolerance: Good (-) hypertension(-) CAD and (-) Past MI Normal cardiovascular exam Rhythm:Regular Rate:Normal - Systolic murmurs    Neuro/Psych Depression negative neurological ROS     GI/Hepatic Neg liver ROS, GERD  ,crohns disease    Endo/Other  negative endocrine ROSneg diabetes  Renal/GU negative Renal ROS     Musculoskeletal  (+) Arthritis , Fibromyalgia -  Abdominal (+) - obese,   Peds  Hematology negative hematology ROS (+)   Anesthesia Other Findings Past Medical History: No date: Arthritis No date: Crohn disease (HCC) No date: Depression No date: DJD (degenerative joint disease) No date: Fibromyalgia No date: Full dentures No date: GERD (gastroesophageal reflux disease) No date: Hypercholesterolemia No date: Osteoporosis No date: Reflux No date: Sleep apnea     Comment: uses a cpap No date: Wears glasses   Reproductive/Obstetrics                            Anesthesia Physical  Anesthesia Plan  ASA: II  Anesthesia Plan: Spinal   Post-op Pain Management:    Induction: Intravenous  PONV Risk Score and Plan: 2 and Ondansetron and Midazolam  Airway Management Planned: Simple Face Mask  Additional Equipment:   Intra-op Plan:    Post-operative Plan:   Informed Consent: I have reviewed the patients History and Physical, chart, labs and discussed the procedure including the risks, benefits and alternatives for the proposed anesthesia with the patient or authorized representative who has indicated his/her understanding and acceptance.   Dental advisory given  Plan Discussed with: CRNA and Anesthesiologist  Anesthesia Plan Comments:         Anesthesia Quick Evaluation

## 2017-11-26 ENCOUNTER — Encounter (HOSPITAL_COMMUNITY): Payer: Self-pay | Admitting: Orthopaedic Surgery

## 2017-11-26 MED ORDER — ASPIRIN 325 MG PO TBEC: 325.0000 mg | DELAYED_RELEASE_TABLET | Freq: Two times a day (BID) | ORAL | 0 refills | Status: DC

## 2017-11-26 MED ORDER — OXYCODONE-ACETAMINOPHEN 5-325 MG PO TABS: 1.0000 | ORAL_TABLET | ORAL | 0 refills | Status: DC | PRN

## 2017-11-26 MED ORDER — TIZANIDINE HCL 4 MG PO TABS: 4.0000 mg | ORAL_TABLET | Freq: Four times a day (QID) | ORAL | 1 refills | Status: DC | PRN

## 2017-11-26 NOTE — Evaluation (Signed)
Occupational Therapy Evaluation Patient Details Name: Kimberly Andrade MRN: [id number] DOB: 1945-03-23 Today's Date: 11/26/2017    History of Present Illness Pt is a 72 y/o female s/p L THA, direct anterior approach. PMH includes fibromyalgia, pre-diabetes, and sleep apnea on CPAP.    Clinical Impression   This 72 y/o female presents with the above. At baseline pt reports she is mod independent with ADLs and functional mobility using SPC. Pt demonstrating room level functional mobility using RW and toileting ADLs with minguard assist; completed simulated shower transfers using RW with overall minguard and min cues for sequencing/technique. Pt currently requires minA for LB ADLs secondary to LLE functional limitations. She reports she will return home with spouse who is able to assist with ADLs and functional transfers PRN. Education provided and questions answered throughout regarding safety and compensatory strategies for completing ADLs after return home. Pt verbalizing understanding and with no further acute OT needs identified, will sign off at this time. Thank you for this referral.     Follow Up Recommendations  Follow surgeon's recommendation for DC plan and follow-up therapies;Supervision/Assistance - 24 hour    Equipment Recommendations  3 in 1 bedside commode           Precautions / Restrictions Precautions Precautions: Fall Restrictions Weight Bearing Restrictions: Yes Other Position/Activity Restrictions: L LE WBAT      Mobility Bed Mobility Overal bed mobility: Needs Assistance Bed Mobility: Supine to Sit     Supine to sit: Min guard     General bed mobility comments: increased time and effort, use of bed rail, min guard for safety  Transfers Overall transfer level: Needs assistance Equipment used: Rolling walker (2 wheeled) Transfers: Sit to/from Stand Sit to Stand: Min guard         General transfer comment: increased time, good technique, min  guard for safety    Balance Overall balance assessment: Needs assistance Sitting-balance support: No upper extremity supported;Feet supported Sitting balance-Leahy Scale: Good     Standing balance support: Bilateral upper extremity supported;During functional activity Standing balance-Leahy Scale: Poor                             ADL either performed or assessed with clinical judgement   ADL Overall ADL's : Needs assistance/impaired Eating/Feeding: Independent;Sitting   Grooming: Min guard;Standing   Upper Body Bathing: Supervision/ safety;Sitting   Lower Body Bathing: Minimal assistance;Sit to/from stand   Upper Body Dressing : Min guard;Sitting;Standing;Minimal assistance Upper Body Dressing Details (indicate cue type and reason): minA donning gown in standing Lower Body Dressing: Minimal assistance;Sit to/from stand Lower Body Dressing Details (indicate cue type and reason): pt with difficulty reaching LLE at this time, reports spouse can assist with LB tasks Toilet Transfer: Min guard;Ambulation;Regular Toilet;Grab bars Toilet Transfer Details (indicate cue type and reason): pt able to rise from toileting without use of grab bar Toileting- Clothing Manipulation and Hygiene: Min guard;Sit to/from stand   Tub/ Banker: Walk-in shower;Ambulation;3 in 1;Rolling walker;Min guard;Minimal assistance Tub/Shower Transfer Details (indicate cue type and reason): overall minguard; pt requires increased time/effort for lifting LLE to advance over ledge; performed x2; min cues for proper sequencing Functional mobility during ADLs: Min guard;Rolling walker General ADL Comments: educated on general safety and compensatory strategies for completing ADLs and functional transfers                  Pertinent Vitals/Pain Pain Assessment: Faces Faces Pain Scale: Hurts  little more Pain Location: back Pain Descriptors / Indicators: Aching;Operative site guarding Pain  Intervention(s): Limited activity within patient's tolerance;Monitored during session;Repositioned     Hand Dominance     Extremity/Trunk Assessment Upper Extremity Assessment Upper Extremity Assessment: Overall WFL for tasks assessed   Lower Extremity Assessment Lower Extremity Assessment: Defer to PT evaluation       Communication Communication Communication: No difficulties   Cognition Arousal/Alertness: Awake/alert Behavior During Therapy: WFL for tasks assessed/performed Overall Cognitive Status: Within Functional Limits for tasks assessed                                     General Comments       Exercises Exercises: Total Joint Total Joint Exercises Long Arc Quad: AROM;Both;10 reps;Seated Marching in Standing: Seated;AROM;Strengthening;Both;10 reps   Shoulder Instructions      Home Living Family/patient expects to be discharged to:: Private residence Living Arrangements: Spouse/significant other Available Help at Discharge: Family;Available 24 hours/day Type of Home: House Home Access: Stairs to enter [employer] of Steps: 3 Entrance Stairs-Rails: None Home Layout: One level     Bathroom Shower/Tub: Occupational psychologist: Standard     Home Equipment: Environmental consultant - 2 wheels;Cane - single point          Prior Functioning/Environment Level of Independence: Independent with assistive device(s)        Comments: Used cane for ambulation.         OT Problem List: Decreased strength;Decreased activity tolerance;Impaired balance (sitting and/or standing);Decreased knowledge of use of DME or AE      OT Treatment/Interventions:      OT Goals(Current goals can be found in the care plan section) Acute Rehab OT Goals Patient Stated Goal: to decrease pain  OT Goal Formulation: All assessment and education complete, DC therapy  OT Frequency:     Barriers to D/C:            Co-evaluation              AM-PAC  PT "6 Clicks" Daily Activity     Outcome Measure Help from another person eating meals?: None Help from another person taking care of personal grooming?: A Little Help from another person toileting, which includes using toliet, bedpan, or urinal?: A Little Help from another person bathing (including washing, rinsing, drying)?: A Little Help from another person to put on and taking off regular upper body clothing?: None Help from another person to put on and taking off regular lower body clothing?: A Little 6 Click Score: 20   End of Session Equipment Utilized During Treatment: Gait belt;Rolling walker Nurse Communication: Mobility status  Activity Tolerance: Patient tolerated treatment well Patient left: with call bell/phone within reach;Other (comment);with bed alarm set(seated EOB)  OT Visit Diagnosis: Other abnormalities of gait and mobility (R26.89)                Time: 6286-3817 OT Time Calculation (min): 27 min Charges:  OT General Charges $OT Visit: 1 Visit OT Evaluation $OT Eval Low Complexity: 1 Low OT Treatments $Self Care/Home Management : 8-22 mins  Lou Cal, OT Supplemental Rehabilitation Services Pager [phone] Office [phone]   Raymondo Band 11/26/2017, 1:45 PM

## 2017-11-26 NOTE — Progress Notes (Signed)
CSW received SNF consult. Spoke with ortho bundle case management, pt going home with home health. No social work needs identified at this time.   CSW signing off.   [place], [place]

## 2017-11-26 NOTE — Progress Notes (Signed)
Physical Therapy Treatment Patient Details Name: Kimberly Andrade MRN: [id number] DOB: [birth year] Today's Date: 11/26/2017    History of Present Illness Pt is a 72 y/o female s/p L THA, direct anterior approach. PMH includes fibromyalgia, pre-diabetes, and sleep apnea on CPAP.     PT Comments    Pt making steady progress with functional mobility. Pt's husband present throughout session, attentive and eager to assist. Pt would continue to benefit from skilled physical therapy services at this time while admitted and after d/c to address the below listed limitations in order to improve overall safety and independence with functional mobility.    Follow Up Recommendations  Home health PT;Supervision/Assistance - 24 hour     Equipment Recommendations  None recommended by PT    Recommendations for Other Services       Precautions / Restrictions Precautions Precautions: Fall Restrictions Weight Bearing Restrictions: Yes Other Position/Activity Restrictions: L LE WBAT    Mobility  Bed Mobility Overal bed mobility: Needs Assistance Bed Mobility: Supine to Sit     Supine to sit: Min guard     General bed mobility comments: pt sitting EOB upon arrival  Transfers Overall transfer level: Needs assistance Equipment used: Rolling walker (2 wheeled) Transfers: Sit to/from Stand Sit to Stand: Min guard         General transfer comment: increased time, good technique, min guard for safety  Ambulation/Gait Ambulation/Gait assistance: Min guard Gait Distance (Feet): 150 Feet Assistive device: Rolling walker (2 wheeled) Gait Pattern/deviations: Step-to pattern;Step-through pattern;Decreased step length - right;Decreased step length - left;Decreased stride length Gait velocity: Decreased  Gait velocity interpretation: 1.31 - 2.62 ft/sec, indicative of limited community ambulator General Gait Details: pt with slow, cautious but steady gait with RW, min guard for  safety   Stairs             Wheelchair Mobility    Modified Rankin (Stroke Patients Only)       Balance Overall balance assessment: Needs assistance Sitting-balance support: No upper extremity supported;Feet supported Sitting balance-Leahy Scale: Good     Standing balance support: Bilateral upper extremity supported;During functional activity Standing balance-Leahy Scale: Poor                              Cognition Arousal/Alertness: Awake/alert Behavior During Therapy: WFL for tasks assessed/performed Overall Cognitive Status: Within Functional Limits for tasks assessed                                        Exercises Total Joint Exercises Marching in Standing: AROM;Strengthening;Both;10 reps;Standing Standing Hip Extension: AROM;Strengthening;Left;10 reps;Standing General Exercises - Lower Extremity Mini-Sqauts: AROM;Strengthening;Both;10 reps;Standing    General Comments        Pertinent Vitals/Pain Pain Assessment: Faces Faces Pain Scale: Hurts little more Pain Location: back Pain Descriptors / Indicators: Aching;Sore Pain Intervention(s): Monitored during session;Repositioned    Home Living Family/patient expects to be discharged to:: Private residence Living Arrangements: Spouse/significant other Available Help at Discharge: Family;Available 24 hours/day Type of Home: House Home Access: Stairs to enter Entrance Stairs-Rails: None Home Layout: One level Home Equipment: Environmental consultant - 2 wheels;Cane - single point      Prior Function Level of Independence: Independent with assistive device(s)      Comments: Used cane for ambulation.    PT Goals (current goals can now be found in the  care plan section) Acute Rehab PT Goals Patient Stated Goal: to decrease pain  PT Goal Formulation: With patient Time For Goal Achievement: 12/09/17 Potential to Achieve Goals: Good Progress towards PT goals: Progressing toward goals     Frequency    7X/week      PT Plan Current plan remains appropriate    Co-evaluation              AM-PAC PT "6 Clicks" Daily Activity  Outcome Measure  Difficulty turning over in bed (including adjusting bedclothes, sheets and blankets)?: A Lot Difficulty moving from lying on back to sitting on the side of the bed? : Unable Difficulty sitting down on and standing up from a chair with arms (e.g., wheelchair, bedside commode, etc,.)?: Unable Help needed moving to and from a bed to chair (including a wheelchair)?: A Little Help needed walking in hospital room?: A Little Help needed climbing 3-5 steps with a railing? : A Little 6 Click Score: 13    End of Session Equipment Utilized During Treatment: Gait belt Activity Tolerance: Patient tolerated treatment well Patient left: with call bell/phone within reach;with family/visitor present;Other (comment)(sitting on toilet) Nurse Communication: Mobility status PT Visit Diagnosis: Other abnormalities of gait and mobility (R26.89);Pain;Unsteadiness on feet (R26.81);Muscle weakness (generalized) (M62.81) Pain - Right/Left: Left Pain - part of body: Hip     Time: 1683-7290 PT Time Calculation (min) (ACUTE ONLY): 12 min  Charges:  $Gait Training: 8-22 mins                     Sherie Don, [place], DPT  Acute Rehabilitation Services Pager [phone] Office [place] 11/26/2017, 4:38 PM

## 2017-11-26 NOTE — Plan of Care (Signed)
  Problem: Education: Goal: Knowledge of General Education information will improve Description Including pain rating scale, medication(s)/side effects and non-pharmacologic comfort measures Outcome: Progressing   

## 2017-11-26 NOTE — Progress Notes (Signed)
Physical Therapy Treatment Patient Details Name: Kimberly Andrade MRN: [id number] DOB: 1945/04/11 Today's Date: 11/26/2017    History of Present Illness Pt is a 72 y/o female s/p L THA, direct anterior approach. PMH includes fibromyalgia, pre-diabetes, and sleep apnea on CPAP.     PT Comments    Pt making steady progress with functional mobility. She tolerated ambulating a further distance this session with min guard and use of RW. PT will continue to follow acutely to progress mobility as tolerated. Pt will need to participate in stair training prior to d/c home.  Pt would continue to benefit from skilled physical therapy services at this time while admitted and after d/c to address the below listed limitations in order to improve overall safety and independence with functional mobility.   Follow Up Recommendations  Home health PT;Supervision/Assistance - 24 hour     Equipment Recommendations  None recommended by PT    Recommendations for Other Services       Precautions / Restrictions Precautions Precautions: Fall Restrictions Weight Bearing Restrictions: Yes Other Position/Activity Restrictions: L LE WBAT    Mobility  Bed Mobility Overal bed mobility: Needs Assistance Bed Mobility: Supine to Sit     Supine to sit: Min guard     General bed mobility comments: increased time and effort, use of bed rail, min guard for safety  Transfers Overall transfer level: Needs assistance Equipment used: Rolling walker (2 wheeled) Transfers: Sit to/from Stand Sit to Stand: Min guard         General transfer comment: increased time, good technique, min guard for safety  Ambulation/Gait Ambulation/Gait assistance: Min guard Gait Distance (Feet): 100 Feet Assistive device: Rolling walker (2 wheeled) Gait Pattern/deviations: Step-to pattern;Step-through pattern;Decreased step length - right;Decreased step length - left;Decreased stride length Gait velocity: Decreased   Gait velocity interpretation: <1.31 ft/sec, indicative of household ambulator General Gait Details: pt with slow, cautious but steady gait with RW, min guard for safety   Stairs             Wheelchair Mobility    Modified Rankin (Stroke Patients Only)       Balance Overall balance assessment: Needs assistance Sitting-balance support: No upper extremity supported;Feet supported Sitting balance-Leahy Scale: Good     Standing balance support: Bilateral upper extremity supported;During functional activity Standing balance-Leahy Scale: Poor                              Cognition Arousal/Alertness: Awake/alert Behavior During Therapy: WFL for tasks assessed/performed Overall Cognitive Status: Within Functional Limits for tasks assessed                                        Exercises Total Joint Exercises Long Arc Quad: AROM;Both;10 reps;Seated Marching in Standing: Seated;AROM;Strengthening;Both;10 reps    General Comments        Pertinent Vitals/Pain Pain Assessment: Faces Faces Pain Scale: Hurts even more Pain Location: L hip  Pain Descriptors / Indicators: Aching;Operative site guarding Pain Intervention(s): Repositioned;Monitored during session    Home Living                      Prior Function            PT Goals (current goals can now be found in the care plan section) Acute Rehab PT Goals PT Goal  Formulation: With patient Time For Goal Achievement: 12/09/17 Potential to Achieve Goals: Good Progress towards PT goals: Progressing toward goals    Frequency    7X/week      PT Plan Current plan remains appropriate    Co-evaluation              AM-PAC PT "6 Clicks" Daily Activity  Outcome Measure  Difficulty turning over in bed (including adjusting bedclothes, sheets and blankets)?: A Lot Difficulty moving from lying on back to sitting on the side of the bed? : Unable Difficulty sitting down on  and standing up from a chair with arms (e.g., wheelchair, bedside commode, etc,.)?: Unable Help needed moving to and from a bed to chair (including a wheelchair)?: A Little Help needed walking in hospital room?: A Little Help needed climbing 3-5 steps with a railing? : A Little 6 Click Score: 13    End of Session Equipment Utilized During Treatment: Gait belt Activity Tolerance: Patient tolerated treatment well Patient left: in bed;with call bell/phone within reach;Other (comment)(sitting EOB with nurse tech present to assess vitals) Nurse Communication: Mobility status PT Visit Diagnosis: Other abnormalities of gait and mobility (R26.89);Pain;Unsteadiness on feet (R26.81);Muscle weakness (generalized) (M62.81) Pain - Right/Left: Left Pain - part of body: Hip     Time: 0922-0937 PT Time Calculation (min) (ACUTE ONLY): 15 min  Charges:  $Gait Training: 8-22 mins                     Sherie Don, [place], DPT  Acute Rehabilitation Services Pager [phone] Office [place] 11/26/2017, 10:52 AM

## 2017-11-26 NOTE — Care Plan (Signed)
Ortho Bundle Case Management Note  Patient Details  Name: Kimberly Andrade MRN: [id number] Date of Birth: [birth year]                     DME Arranged:  Bedside commode [facility]  [facility]  HH Arranged:  PT HH Agency:  [facility]  Additional Comments: Please contact me with any questions of if this plan should need to change.  Ladell Heads,  [facility]  [phone] 11/26/2017, 1:56 PM

## 2017-11-26 NOTE — Progress Notes (Signed)
Subjective: 1 Day Post-Op Procedure(s) (LRB): LEFT TOTAL HIP ARTHROPLASTY ANTERIOR APPROACH (Left)   Patient is feeling well. Pain better than it was before surgery. She is looking forward to PT.  Activity level:  wbat Diet tolerance:  ok Voiding:  ok Patient reports pain as mild.    Objective: Vital signs in last 24 hours: Temp:  [97.5 F (36.4 C)-98.6 F (37 C)] 97.9 F (36.6 C) (09/25 0437) Pulse Rate:  [48-80] 59 (09/25 0437) Resp:  [11-20] 16 (09/25 0437) BP: (86-154)/(56-80) 108/69 (09/25 0437) SpO2:  [92 %-98 %] 94 % (09/25 0437) FiO2 (%):  [2 %] 2 % (09/24 1525) Weight:  [75.7 kg-75.8 kg] 75.8 kg (09/24 1525)  Labs: No results for input(s): HGB in the last 72 hours. No results for input(s): WBC, RBC, HCT, PLT in the last 72 hours. No results for input(s): NA, K, CL, CO2, BUN, CREATININE, GLUCOSE, CALCIUM in the last 72 hours. No results for input(s): LABPT, INR in the last 72 hours.  Physical Exam:  Neurologically intact ABD soft Neurovascular intact Sensation intact distally Intact pulses distally Dorsiflexion/Plantar flexion intact Incision: dressing C/D/I and no drainage No cellulitis present Compartment soft  Assessment/Plan:  1 Day Post-Op Procedure(s) (LRB): LEFT TOTAL HIP ARTHROPLASTY ANTERIOR APPROACH (Left) Advance diet Up with therapy Plan for discharge tomorrow Discharge home with home health if doing well and cleared by PT. Continue on ASA 325mg  BID x 4 weeks post op. Follow up in office 2 weeks post op.  Neiko Trivedi, [place]ance Sachs 11/26/2017, 7:46 AM

## 2017-11-27 MED ORDER — SODIUM CHLORIDE 0.9 % IV BOLUS
500.0000 mL | Freq: Once | INTRAVENOUS | Status: AC
  Administered 2017-11-27: 500 mL via INTRAVENOUS

## 2017-11-27 NOTE — Progress Notes (Signed)
Physical Therapy Treatment Patient Details Name: Kimberly Andrade MRN: [id number] DOB: 13-Jun-1945 Today's Date: 11/27/2017    History of Present Illness Pt is a 72 y/o female s/p L THA, direct anterior approach. PMH includes fibromyalgia, pre-diabetes, and sleep apnea on CPAP.     PT Comments    Pt making slow progress and limited secondary to nausea this AM. Pt's RN was notified. Pt will need stair training prior to d/c home with family today (unable to tolerate this session). Pt would continue to benefit from skilled physical therapy services at this time while admitted and after d/c to address the below listed limitations in order to improve overall safety and independence with functional mobility.    Follow Up Recommendations  Home health PT;Supervision/Assistance - 24 hour     Equipment Recommendations  None recommended by PT    Recommendations for Other Services       Precautions / Restrictions Precautions Precautions: Fall Restrictions Weight Bearing Restrictions: Yes LLE Weight Bearing: Weight bearing as tolerated    Mobility  Bed Mobility Overal bed mobility: Needs Assistance Bed Mobility: Sit to Supine       Sit to supine: Min assist   General bed mobility comments: assist to return bilateral LEs onto bed  Transfers Overall transfer level: Needs assistance Equipment used: Rolling walker (2 wheeled) Transfers: Sit to/from Stand Sit to Stand: Min guard         General transfer comment: increased time, good technique, min guard for safety  Ambulation/Gait Ambulation/Gait assistance: Min guard Gait Distance (Feet): 50 Feet Assistive device: Rolling walker (2 wheeled) Gait Pattern/deviations: Step-to pattern;Step-through pattern;Decreased step length - right;Decreased step length - left;Decreased stride length Gait velocity: Decreased  Gait velocity interpretation: <1.31 ft/sec, indicative of household ambulator General Gait Details: very limited  this session secondary to nausea (RN notified); pt with slow, cautious but steady gait with RW, min guard for safety   Stairs             Wheelchair Mobility    Modified Rankin (Stroke Patients Only)       Balance Overall balance assessment: Needs assistance Sitting-balance support: No upper extremity supported;Feet supported Sitting balance-Leahy Scale: Good     Standing balance support: Bilateral upper extremity supported;During functional activity Standing balance-Leahy Scale: Poor Standing balance comment: Required BUE support.                             Cognition Arousal/Alertness: Awake/alert Behavior During Therapy: WFL for tasks assessed/performed Overall Cognitive Status: Within Functional Limits for tasks assessed                                        Exercises Total Joint Exercises Ankle Circles/Pumps: AROM;Both;20 reps Long Arc Quad: AAROM;Left;10 reps;Seated Marching in Standing: AROM;Both;10 reps;Standing General Exercises - Lower Extremity Mini-Sqauts: AROM;Strengthening;Both;10 reps;Standing    General Comments        Pertinent Vitals/Pain Pain Assessment: Faces Faces Pain Scale: Hurts whole lot Pain Location: L hip, overall Pain Descriptors / Indicators: Aching;Sore Pain Intervention(s): Monitored during session;Repositioned;Patient requesting pain meds-RN notified    Home Living                      Prior Function            PT Goals (current goals can now be found  in the care plan section) Acute Rehab PT Goals PT Goal Formulation: With patient Time For Goal Achievement: 12/09/17 Potential to Achieve Goals: Good Progress towards PT goals: Progressing toward goals    Frequency    7X/week      PT Plan Current plan remains appropriate    Co-evaluation              AM-PAC PT "6 Clicks" Daily Activity  Outcome Measure  Difficulty turning over in bed (including adjusting  bedclothes, sheets and blankets)?: A Lot Difficulty moving from lying on back to sitting on the side of the bed? : Unable Difficulty sitting down on and standing up from a chair with arms (e.g., wheelchair, bedside commode, etc,.)?: Unable Help needed moving to and from a bed to chair (including a wheelchair)?: A Little Help needed walking in hospital room?: A Little Help needed climbing 3-5 steps with a railing? : A Little 6 Click Score: 13    End of Session Equipment Utilized During Treatment: Gait belt Activity Tolerance: Patient limited by pain;Other (comment)(limited secondary to nausea) Patient left: in bed;with call bell/phone within reach Nurse Communication: Mobility status PT Visit Diagnosis: Other abnormalities of gait and mobility (R26.89);Pain;Unsteadiness on feet (R26.81);Muscle weakness (generalized) (M62.81) Pain - Right/Left: Left Pain - part of body: Hip     Time: 8159-4707 PT Time Calculation (min) (ACUTE ONLY): 19 min  Charges:  $Therapeutic Activity: 8-22 mins                     Sherie Don, PT, DPT  Acute Rehabilitation Services Pager [phone] Office [place] 11/27/2017, 10:17 AM

## 2017-11-27 NOTE — Progress Notes (Signed)
Subjective: 2 Days Post-Op Procedure(s) (LRB): LEFT TOTAL HIP ARTHROPLASTY ANTERIOR APPROACH (Left)   Patient feels good. He blood pressures are a little low this morning but she feels great. She is looking forward to going home this morning.   Activity level:  wbat Diet tolerance:  ok Voiding:  ok Patient reports pain as mild.    Objective: Vital signs in last 24 hours: Temp:  [97.7 F (36.5 C)-98.9 F (37.2 C)] 98.7 F (37.1 C) (09/26 0626) Pulse Rate:  [56-72] 68 (09/26 0626) Resp:  [18-20] 18 (09/25 2100) BP: (81-103)/(46-66) 99/53 (09/26 0626) SpO2:  [91 %-97 %] 91 % (09/26 0626)  Labs: No results for input(s): HGB in the last 72 hours. No results for input(s): WBC, RBC, HCT, PLT in the last 72 hours. No results for input(s): NA, K, CL, CO2, BUN, CREATININE, GLUCOSE, CALCIUM in the last 72 hours. No results for input(s): LABPT, INR in the last 72 hours.  Physical Exam:  Neurologically intact ABD soft Neurovascular intact Sensation intact distally Intact pulses distally Dorsiflexion/Plantar flexion intact Incision: dressing C/D/I and no drainage No cellulitis present Compartment soft  Assessment/Plan:  2 Days Post-Op Procedure(s) (LRB): LEFT TOTAL HIP ARTHROPLASTY ANTERIOR APPROACH (Left) Advance diet Up with therapy Discharge home with home health after PT this morning if feeling and doing well. Continue on ASA 325mg  BID x 2 weeks post op. We are going to give a 500cc bolus to help with low BP.  Follow up in office 2 weeks post op.  Maury Bamba, Larwance Sachs 11/27/2017, 6:59 AM

## 2017-11-27 NOTE — Discharge Summary (Signed)
Patient ID: Kimberly Andrade MRN: [id number] DOB/AGE: Mar 06, 1945 72 y.o.  Admit date: 11/25/2017 Discharge date: 11/27/2017  Admission Diagnoses:  Principal Problem:   Primary osteoarthritis of left hip   Discharge Diagnoses:  Same  Past Medical History:  Diagnosis Date  . Anemia   . Anxiety   . Arthritis   . B12 deficiency   . Crohn disease ([place])   . Crohn's disease ([place])   . Depression   . Diverticulosis   . DJD (degenerative joint disease)   . DJD (degenerative joint disease)   . Dyspnea    with exertion (incline walking)  . Fibromyalgia    pt denies  . Full dentures   . GERD (gastroesophageal reflux disease)   . Heart palpitations   . Hiatal hernia   . History of hiatal hernia   . Hypercholesterolemia   . Hyperlipidemia   . Incontinence of urine   . Medical history non-contributory   . Nocturnal leg cramps   . Osteopenia   . Osteoporosis   . Pre-diabetes   . Reflux   . Sleep apnea    uses a cpap  . Wears glasses     Surgeries: Procedure(s): LEFT TOTAL HIP ARTHROPLASTY ANTERIOR APPROACH on 11/25/2017   Consultants:   Discharged Condition: Improved  Hospital Course: Kimberly Andrade is an 72 y.o. female who was admitted 11/25/2017 for operative treatment ofPrimary osteoarthritis of left hip. Patient has severe unremitting pain that affects sleep, daily activities, and work/hobbies. After pre-op clearance the patient was taken to the operating room on 11/25/2017 and underwent  Procedure(s): LEFT TOTAL HIP ARTHROPLASTY ANTERIOR APPROACH.    Patient was given perioperative antibiotics:  Anti-infectives (From admission, onward)   Start     Dose/Rate Route Frequency Ordered Stop   11/25/17 1700  ceFAZolin (ANCEF) IVPB 2g/100 mL premix     2 g 200 mL/hr over 30 Minutes Intravenous Every 6 hours 11/25/17 1518 11/26/17 0459   11/25/17 0830  ceFAZolin (ANCEF) IVPB 2g/100 mL premix     2 g 200 mL/hr over 30 Minutes Intravenous On call to O.R. 11/25/17  0826 11/25/17 1048   11/25/17 0830  ceFAZolin (ANCEF) 2-4 GM/100ML-% IVPB    Note to Pharmacy:  Tamsen Snider   : cabinet override      11/25/17 0830 11/25/17 1048       Patient was given sequential compression devices, early ambulation, and chemoprophylaxis to prevent DVT.  Patient benefited maximally from hospital stay and there were no complications.    Recent vital signs:  Patient Vitals for the past 24 hrs:  BP Temp Temp src Pulse Resp SpO2  11/27/17 0626 (!) 99/53 98.7 F (37.1 C) Oral 68 - 91 %  11/27/17 0624 (!) 81/46 - - - - -  11/26/17 2100 (!) 93/56 98.9 F (37.2 C) Oral 72 18 93 %  11/26/17 1315 103/66 97.7 F (36.5 C) Oral 60 18 97 %  11/26/17 0940 (!) 101/57 98 F (36.7 C) Oral (!) 56 20 95 %     Recent laboratory studies: No results for input(s): WBC, HGB, HCT, PLT, NA, K, CL, CO2, BUN, CREATININE, GLUCOSE, INR, CALCIUM in the last 72 hours.  Invalid input(s): PT, 2   Discharge Medications:   Allergies as of 11/27/2017      Reactions   Fosamax [alendronate Sodium] Other (See Comments)   Bones hurt   Lipitor [atorvastatin] Other (See Comments)   Bone pain   Codeine Other (See Comments)    Codeine  in the liquid form causes gastritis   Hydrocodone Nausea And Vomiting      Medication List    STOP taking these medications   naproxen 250 MG tablet Commonly known as:  NAPROSYN     TAKE these medications   aspirin 325 MG EC tablet Take 1 tablet (325 mg total) by mouth 2 (two) times daily after a meal. What changed:    medication strength  how much to take  when to take this   CALCIUM 600+D3 PLUS MINERALS 600-800 MG-UNIT Tabs Take 1 tablet by mouth daily.   cholecalciferol 1000 units tablet Commonly known as:  VITAMIN D Take 1,000 Units by mouth daily.   escitalopram 5 MG tablet Commonly known as:  LEXAPRO Take 5 mg by mouth at bedtime.   ferrous sulfate 325 (65 FE) MG tablet Take 325 mg by mouth daily with breakfast.   Garlic 3500 MG  Caps Take 1,000 mg by mouth daily.   Magnesium 250 MG Tabs Take 250 mg by mouth daily.   mesalamine 500 MG CR capsule Commonly known as:  PENTASA Take 2,000 mg by mouth 2 (two) times daily.   multivitamin with minerals tablet Take 1 tablet by mouth daily.   omeprazole 40 MG capsule Commonly known as:  PRILOSEC Take 40 mg by mouth at bedtime.   ondansetron 4 MG tablet Commonly known as:  ZOFRAN Take 1 tablet (4 mg total) by mouth every 8 (eight) hours as needed for nausea or vomiting.   oxybutynin 10 MG 24 hr tablet Commonly known as:  DITROPAN-XL Take 10 mg by mouth at bedtime.   oxyCODONE-acetaminophen 5-325 MG tablet Commonly known as:  PERCOCET/ROXICET Take 1-2 tablets by mouth every 4 (four) hours as needed for severe pain.   Potassium 99 MG Tabs Take 99 mg by mouth daily.   tiZANidine 4 MG tablet Commonly known as:  ZANAFLEX Take 1 tablet (4 mg total) by mouth every 6 (six) hours as needed.   vitamin B-12 1000 MCG tablet Commonly known as:  CYANOCOBALAMIN Take 1,000 mcg by mouth daily.            Durable Medical Equipment  (From admission, onward)         Start     Ordered   11/25/17 1519  DME Walker rolling  Once    Question:  Patient needs a walker to treat with the following condition  Answer:  Primary osteoarthritis of left hip   11/25/17 1518   11/25/17 1519  DME 3 n 1  Once     11/25/17 1518   11/25/17 1519  DME Bedside commode  Once    Question:  Patient needs a bedside commode to treat with the following condition  Answer:  Primary osteoarthritis of left hip   11/25/17 1518          Diagnostic Studies: Dg C-arm 1-60 Min  Result Date: 11/25/2017 CLINICAL DATA:  Status post anterior approach left total hip joint prosthesis placement. EXAM: DG C-ARM 61-120 MIN; OPERATIVE LEFT HIP WITH PELVIS COMPARISON:  None. FINDINGS: Reported fluoro time is 35 seconds. Two fluoro spot images are reviewed. The patient has undergone left total hip joint  prosthesis placement. Radiographic positioning of the prosthetic components is good. The interface with the native bone appears normal. IMPRESSION: There is no immediate postprocedure complication following anterior approach left total hip joint prosthesis placement. Electronically Signed   By: David  Martinique [name]   On: 11/25/2017 12:40   Dg Hip Operative  Unilat W Or W/o Pelvis Left  Result Date: 11/25/2017 CLINICAL DATA:  Status post anterior approach left total hip joint prosthesis placement. EXAM: DG C-ARM 61-120 MIN; OPERATIVE LEFT HIP WITH PELVIS COMPARISON:  None. FINDINGS: Reported fluoro time is 35 seconds. Two fluoro spot images are reviewed. The patient has undergone left total hip joint prosthesis placement. Radiographic positioning of the prosthetic components is good. The interface with the native bone appears normal. IMPRESSION: There is no immediate postprocedure complication following anterior approach left total hip joint prosthesis placement. Electronically Signed   By: David  Martinique [name]   On: 11/25/2017 12:40    Disposition: Discharge disposition: 01-Home or Self Care       Discharge Instructions    Call MD / Call 911   Complete by:  As directed    If you experience chest pain or shortness of breath, CALL 911 and be transported to the hospital emergency room.  If you develope a fever above 101 F, pus (white drainage) or increased drainage or redness at the wound, or calf pain, call your surgeon's office.   Constipation Prevention   Complete by:  As directed    Drink plenty of fluids.  Prune juice may be helpful.  You may use a stool softener, such as Colace (over the counter) 100 mg twice a day.  Use MiraLax (over the counter) for constipation as needed.   Diet - low sodium heart healthy   Complete by:  As directed    Discharge instructions   Complete by:  As directed    INSTRUCTIONS AFTER JOINT REPLACEMENT   Remove items at home which could result in a fall. This  includes throw rugs or furniture in walking pathways ICE to the affected joint every three hours while awake for 30 minutes at a time, for at least the first 3-5 days, and then as needed for pain and swelling.  Continue to use ice for pain and swelling. You may notice swelling that will progress down to the foot and ankle.  This is normal after surgery.  Elevate your leg when you are not up walking on it.   Continue to use the breathing machine you got in the hospital (incentive spirometer) which will help keep your temperature down.  It is common for your temperature to cycle up and down following surgery, especially at night when you are not up moving around and exerting yourself.  The breathing machine keeps your lungs expanded and your temperature down.   DIET:  As you were doing prior to hospitalization, we recommend a well-balanced diet.  DRESSING / WOUND CARE / SHOWERING  You may shower 3 days after surgery, but keep the wounds dry during showering.  You may use an occlusive plastic wrap (Press'n Seal for example), NO SOAKING/SUBMERGING IN THE BATHTUB.  If the bandage gets wet, change with a clean dry gauze.  If the incision gets wet, pat the wound dry with a clean towel.  ACTIVITY  Increase activity slowly as tolerated, but follow the weight bearing instructions below.   No driving for 6 weeks or until further direction given by your physician.  You cannot drive while taking narcotics.  No lifting or carrying greater than 10 lbs. until further directed by your surgeon. Avoid periods of inactivity such as sitting longer than an hour when not asleep. This helps prevent blood clots.  You may return to work once you are authorized by your doctor.     WEIGHT BEARING  Weight bearing as tolerated with assist device (walker, cane, etc) as directed, use it as long as suggested by your surgeon or therapist, typically at least 4-6 weeks.   EXERCISES  Results after joint replacement surgery  are often greatly improved when you follow the exercise, range of motion and muscle strengthening exercises prescribed by your doctor. Safety measures are also important to protect the joint from further injury. Any time any of these exercises cause you to have increased pain or swelling, decrease what you are doing until you are comfortable again and then slowly increase them. If you have problems or questions, call your caregiver or physical therapist for advice.   Rehabilitation is important following a joint replacement. After just a few days of immobilization, the muscles of the leg can become weakened and shrink (atrophy).  These exercises are designed to build up the tone and strength of the thigh and leg muscles and to improve motion. Often times heat used for twenty to thirty minutes before working out will loosen up your tissues and help with improving the range of motion but do not use heat for the first two weeks following surgery (sometimes heat can increase post-operative swelling).   These exercises can be done on a training (exercise) mat, on the floor, on a table or on a bed. Use whatever works the best and is most comfortable for you.    Use music or television while you are exercising so that the exercises are a pleasant break in your day. This will make your life better with the exercises acting as a break in your routine that you can look forward to.   Perform all exercises about fifteen times, three times per day or as directed.  You should exercise both the operative leg and the other leg as well.   Exercises include:   Quad Sets - Tighten up the muscle on the front of the thigh (Quad) and hold for 5-10 seconds.   Straight Leg Raises - With your knee straight (if you were given a brace, keep it on), lift the leg to 60 degrees, hold for 3 seconds, and slowly lower the leg.  Perform this exercise against resistance later as your leg gets stronger.  Leg Slides: Lying on your back,  slowly slide your foot toward your buttocks, bending your knee up off the floor (only go as far as is comfortable). Then slowly slide your foot back down until your leg is flat on the floor again.  Angel Wings: Lying on your back spread your legs to the side as far apart as you can without causing discomfort.  Hamstring Strength:  Lying on your back, push your heel against the floor with your leg straight by tightening up the muscles of your buttocks.  Repeat, but this time bend your knee to a comfortable angle, and push your heel against the floor.  You may put a pillow under the heel to make it more comfortable if necessary.   A rehabilitation program following joint replacement surgery can speed recovery and prevent re-injury in the future due to weakened muscles. Contact your doctor or a physical therapist for more information on knee rehabilitation.    CONSTIPATION  Constipation is defined medically as fewer than three stools per week and severe constipation as less than one stool per week.  Even if you have a regular bowel pattern at home, your normal regimen is likely to be disrupted due to multiple reasons following surgery.  Combination of anesthesia,  postoperative narcotics, change in appetite and fluid intake all can affect your bowels.   YOU MUST use at least one of the following options; they are listed in order of increasing strength to get the job done.  They are all available over the counter, and you may need to use some, POSSIBLY even all of these options:    Drink plenty of fluids (prune juice may be helpful) and high fiber foods Colace 100 mg by mouth twice a day  Senokot for constipation as directed and as needed Dulcolax (bisacodyl), take with full glass of water  Miralax (polyethylene glycol) once or twice a day as needed.  If you have tried all these things and are unable to have a bowel movement in the first 3-4 days after surgery call either your surgeon or your primary  doctor.    If you experience loose stools or diarrhea, hold the medications until you stool forms back up.  If your symptoms do not get better within 1 week or if they get worse, check with your doctor.  If you experience "the worst abdominal pain ever" or develop nausea or vomiting, please contact the office immediately for further recommendations for treatment.   ITCHING:  If you experience itching with your medications, try taking only a single pain pill, or even half a pain pill at a time.  You can also use Benadryl over the counter for itching or also to help with sleep.   TED HOSE STOCKINGS:  Use stockings on both legs until for at least 2 weeks or as directed by physician office. They may be removed at night for sleeping.  MEDICATIONS:  See your medication summary on the "After Visit Summary" that nursing will review with you.  You may have some home medications which will be placed on hold until you complete the course of blood thinner medication.  It is important for you to complete the blood thinner medication as prescribed.  PRECAUTIONS:  If you experience chest pain or shortness of breath - call 911 immediately for transfer to the hospital emergency department.   If you develop a fever greater that 101 F, purulent drainage from wound, increased redness or drainage from wound, foul odor from the wound/dressing, or calf pain - CONTACT YOUR SURGEON.                                                   FOLLOW-UP APPOINTMENTS:  If you do not already have a post-op appointment, please call the office for an appointment to be seen by your surgeon.  Guidelines for how soon to be seen are listed in your "After Visit Summary", but are typically between 1-4 weeks after surgery.  OTHER INSTRUCTIONS:   Knee Replacement:  Do not place pillow under knee, focus on keeping the knee straight while resting. CPM instructions: 0-90 degrees, 2 hours in the morning, 2 hours in the afternoon, and 2 hours in the  evening. Place foam block, curve side up under heel at all times except when in CPM or when walking.  DO NOT modify, tear, cut, or change the foam block in any way.  MAKE SURE YOU:  Understand these instructions.  Get help right away if you are not doing well or get worse.    Thank you for letting us be a part of your medical  care team.  It is a privilege we respect greatly.  We hope these instructions will help you stay on track for a fast and full recovery!   Increase activity slowly as tolerated   Complete by:  As directed       Follow-up Information    Melrose Nakayama, MD. Schedule an appointment as soon as possible for a visit on 12/05/2017.   Specialty:  Orthopedic Surgery Why:  Your appointment to see Dr. Rhona Raider in follow up is set for 12/05/17 at 915.  Contact information: [address] [place] [address] [phone]        Therapy, [facility] Follow up on 12/08/2017.   Specialty:  Physical Therapy Why:  Your OPPT will start on 12/08/17 at 100. Please arrive about 15 minutes early to complete paperwork.  Contact information: [place] [address] [phone]            Signed: Rich Fuchs 11/27/2017, 7:03 AM

## 2018-01-06 ENCOUNTER — Encounter
Admission: RE | Admit: 2018-01-06 | Discharge: 2018-01-06 | Disposition: A | Discharge status: Home / Self Care | Payer: Medicare Other | Source: Ambulatory Visit | Attending: Gastroenterology | Admitting: Gastroenterology

## 2018-01-06 DIAGNOSIS — R110 Nausea: Secondary | ICD-10-CM | POA: Diagnosis not present

## 2018-01-06 MED ORDER — TECHNETIUM TC 99M SULFUR COLLOID
2.0000 | Freq: Once | INTRAVENOUS | Status: AC | PRN
  Administered 2018-01-06: 2.73 via INTRAVENOUS

## 2018-01-20 ENCOUNTER — Ambulatory Visit: Payer: Medicare Other | Admitting: Anesthesiology

## 2018-01-20 ENCOUNTER — Other Ambulatory Visit: Payer: Self-pay

## 2018-01-20 ENCOUNTER — Encounter
Admission: RE | Disposition: A | Discharge status: Home / Self Care | Payer: Self-pay | Source: Ambulatory Visit | Attending: Gastroenterology

## 2018-01-20 ENCOUNTER — Ambulatory Visit
Admission: RE | Admit: 2018-01-20 | Discharge: 2018-01-20 | Disposition: A | Discharge status: Home / Self Care | Payer: Medicare Other | Source: Ambulatory Visit | Attending: Gastroenterology | Admitting: Gastroenterology

## 2018-01-20 DIAGNOSIS — Z09 Encounter for follow-up examination after completed treatment for conditions other than malignant neoplasm: Secondary | ICD-10-CM | POA: Diagnosis present

## 2018-01-20 DIAGNOSIS — K449 Diaphragmatic hernia without obstruction or gangrene: Secondary | ICD-10-CM | POA: Insufficient documentation

## 2018-01-20 DIAGNOSIS — K3189 Other diseases of stomach and duodenum: Secondary | ICD-10-CM | POA: Insufficient documentation

## 2018-01-20 DIAGNOSIS — K2950 Unspecified chronic gastritis without bleeding: Secondary | ICD-10-CM | POA: Diagnosis not present

## 2018-01-20 DIAGNOSIS — F329 Major depressive disorder, single episode, unspecified: Secondary | ICD-10-CM | POA: Diagnosis not present

## 2018-01-20 DIAGNOSIS — G4730 Sleep apnea, unspecified: Secondary | ICD-10-CM | POA: Insufficient documentation

## 2018-01-20 DIAGNOSIS — Z87891 Personal history of nicotine dependence: Secondary | ICD-10-CM | POA: Insufficient documentation

## 2018-01-20 DIAGNOSIS — Z79899 Other long term (current) drug therapy: Secondary | ICD-10-CM | POA: Insufficient documentation

## 2018-01-20 DIAGNOSIS — Z791 Long term (current) use of non-steroidal anti-inflammatories (NSAID): Secondary | ICD-10-CM | POA: Diagnosis not present

## 2018-01-20 DIAGNOSIS — Z9989 Dependence on other enabling machines and devices: Secondary | ICD-10-CM | POA: Diagnosis not present

## 2018-01-20 DIAGNOSIS — K219 Gastro-esophageal reflux disease without esophagitis: Secondary | ICD-10-CM | POA: Insufficient documentation

## 2018-01-20 DIAGNOSIS — D649 Anemia, unspecified: Secondary | ICD-10-CM | POA: Diagnosis not present

## 2018-01-20 DIAGNOSIS — Z96642 Presence of left artificial hip joint: Secondary | ICD-10-CM | POA: Diagnosis not present

## 2018-01-20 DIAGNOSIS — F419 Anxiety disorder, unspecified: Secondary | ICD-10-CM | POA: Insufficient documentation

## 2018-01-20 DIAGNOSIS — Z96611 Presence of right artificial shoulder joint: Secondary | ICD-10-CM | POA: Insufficient documentation

## 2018-01-20 DIAGNOSIS — Z8719 Personal history of other diseases of the digestive system: Secondary | ICD-10-CM | POA: Diagnosis not present

## 2018-01-20 HISTORY — PX: ESOPHAGOGASTRODUODENOSCOPY (EGD) WITH PROPOFOL: SHX5813

## 2018-01-20 SURGERY — ESOPHAGOGASTRODUODENOSCOPY (EGD) WITH PROPOFOL
Anesthesia: General

## 2018-01-20 MED ORDER — FENTANYL CITRATE (PF) 100 MCG/2ML IJ SOLN
INTRAMUSCULAR | Status: AC
  Filled 2018-01-20: qty 2

## 2018-01-20 MED ORDER — PROPOFOL 10 MG/ML IV BOLUS
INTRAVENOUS | Status: AC
  Filled 2018-01-20: qty 20

## 2018-01-20 MED ORDER — LIDOCAINE HCL (CARDIAC) PF 100 MG/5ML IV SOSY
PREFILLED_SYRINGE | INTRAVENOUS | Status: DC | PRN
  Administered 2018-01-20: 80 mg via INTRAVENOUS

## 2018-01-20 MED ORDER — PROPOFOL 10 MG/ML IV BOLUS
INTRAVENOUS | Status: DC | PRN
  Administered 2018-01-20: 50 mg via INTRAVENOUS

## 2018-01-20 MED ORDER — PROPOFOL 500 MG/50ML IV EMUL
INTRAVENOUS | Status: DC | PRN
  Administered 2018-01-20: 100 ug/kg/min via INTRAVENOUS

## 2018-01-20 MED ORDER — SODIUM CHLORIDE 0.9 % IV SOLN: INTRAVENOUS | Status: DC

## 2018-01-20 MED ORDER — FENTANYL CITRATE (PF) 100 MCG/2ML IJ SOLN
INTRAMUSCULAR | Status: DC | PRN
  Administered 2018-01-20 (×2): 25 ug via INTRAVENOUS

## 2018-01-20 NOTE — Anesthesia Preprocedure Evaluation (Addendum)
Anesthesia Evaluation  Patient identified by MRN, date of birth, ID band Patient awake    Reviewed: Allergy & Precautions, H&P , NPO status , Patient's Chart, lab work & pertinent test results  Airway Mallampati: II  TM Distance: >3 FB     Dental  (+) Upper Dentures, Lower Dentures   Pulmonary shortness of breath and with exertion, sleep apnea and Continuous Positive Airway Pressure Ventilation , former smoker,           Cardiovascular negative cardio ROS       Neuro/Psych PSYCHIATRIC DISORDERS Anxiety Depression    GI/Hepatic Neg liver ROS, hiatal hernia, GERD  ,  Endo/Other  negative endocrine ROS  Renal/GU negative Renal ROS  negative genitourinary   Musculoskeletal  (+) Arthritis , Fibromyalgia -  Abdominal   Peds  Hematology  (+) Blood dyscrasia, anemia ,   Anesthesia Other Findings Past Medical History: No date: Anemia No date: Anxiety No date: Arthritis No date: B12 deficiency No date: Crohn disease ([place]) No date: Crohn's disease ([place]) No date: Depression No date: Diverticulosis No date: DJD (degenerative joint disease) No date: DJD (degenerative joint disease) No date: Dyspnea     Comment:  with exertion (incline walking) No date: Fibromyalgia     Comment:  pt denies No date: Full dentures No date: GERD (gastroesophageal reflux disease) No date: Heart palpitations No date: Hiatal hernia No date: History of hiatal hernia No date: Hypercholesterolemia No date: Hyperlipidemia No date: Incontinence of urine No date: Medical history non-contributory No date: Nocturnal leg cramps No date: Osteopenia No date: Osteoporosis No date: Pre-diabetes No date: Reflux No date: Sleep apnea     Comment:  uses a cpap No date: Wears glasses  Past Surgical History: No date: ABDOMINAL HYSTERECTOMY No date: APPENDECTOMY No date: CARPEL TUNNEL No date: CHOLECYSTECTOMY No date: COLONOSCOPY 03/11/2016:  COLONOSCOPY WITH PROPOFOL; N/A     Comment:  Procedure: COLONOSCOPY WITH PROPOFOL;  Surgeon: Lollie Sails, MD;  Location: [facility] ENDOSCOPY;  Service:               Endoscopy;  Laterality: N/A; No date: DILATION AND CURETTAGE OF UTERUS 03/11/2016: ESOPHAGOGASTRODUODENOSCOPY; N/A     Comment:  Procedure: ESOPHAGOGASTRODUODENOSCOPY (EGD);  Surgeon:               Lollie Sails, MD;  Location: [facility] ENDOSCOPY;                Service: Endoscopy;  Laterality: N/A; 11/11/2017: ESOPHAGOGASTRODUODENOSCOPY (EGD) WITH PROPOFOL; N/A     Comment:  Procedure: ESOPHAGOGASTRODUODENOSCOPY (EGD) WITH               PROPOFOL;  Surgeon: Lollie Sails, MD;  Location:               [facility] ENDOSCOPY;  Service: Endoscopy;  Laterality: N/A; No date: FOOT SURGERY; Right No date: GALLBLADDER SURGERY No date: HAMMER TOE SURGERY 2006: JOINT REPLACEMENT; Right     Comment:  shoulder 05/25/2013: KNEE ARTHROSCOPY; Right     Comment:  Procedure: ARTHROSCOPY KNEE;  Surgeon: Hessie Dibble,              MD;  Location: [place];  Service:               Orthopedics;  Laterality: Right;  medial and lateral               meniscal  debridment and chondroplasty No date: NO PAST SURGERIES No date: repair of rotator cuff 08/14/2017: SHOULDER ARTHROSCOPY WITH ROTATOR CUFF REPAIR AND  SUBACROMIAL DECOMPRESSION; Left     Comment:  Procedure: SHOULDER ARTHROSCOPY WITH ROTATOR CUFF REPAIR              AND SUBACROMIAL DECOMPRESSION;  Surgeon: Tania Ade, MD;  Location: [place];  Service: Orthopedics;                Laterality: Left; No date: SHOULDER SURGERY; Right     Comment:  X 3 11/25/2017: TOTAL HIP ARTHROPLASTY; Left     Comment:  Procedure: LEFT TOTAL HIP ARTHROPLASTY ANTERIOR               APPROACH;  Surgeon: Melrose Nakayama, MD;  Location: [place];  Service: Orthopedics;  Laterality: Left; No date: UPPER GI ENDOSCOPY     Reproductive/Obstetrics negative  OB ROS                            Anesthesia Physical Anesthesia Plan  ASA: III  Anesthesia Plan: General   Post-op Pain Management:    Induction:   PONV Risk Score and Plan: Propofol infusion and TIVA  Airway Management Planned: Natural Airway and Nasal Cannula  Additional Equipment:   Intra-op Plan:   Post-operative Plan:   Informed Consent: I have reviewed the patients History and Physical, chart, labs and discussed the procedure including the risks, benefits and alternatives for the proposed anesthesia with the patient or authorized representative who has indicated his/her understanding and acceptance.   Dental Advisory Given  Plan Discussed with: Anesthesiologist, CRNA and Surgeon  Anesthesia Plan Comments:         Anesthesia Quick Evaluation

## 2018-01-20 NOTE — H&P (Signed)
Outpatient short stay form Pre-procedure 01/20/2018 11:05 AM Kimberly Sails MD  Primary Physician: Dr. Glendon Axe  Reason for visit: EGD  History of present illness: Patient is a 72 year old female presenting today for an EGD.  EGD was attempted in mid December however she had a lot of food debris and it was not possible to complete that procedure.  She was found to have a mild gastric emptying delay on testing.  That time she apparently was taking pain medications in regards to an anticipated hip surgery.  He has been doing well since then.  He currently denies taking any aspirin or blood thinning agent.  On her EGD a little over a year ago was found to have a nodule in the gastric antrum and a chronic gastritis.  Of note she has a history of Crohn's disease.  Presented today for recheck in these regard.    Current Facility-Administered Medications:  .  0.9 %  sodium chloride infusion, , Intravenous, Continuous, Kimberly Sails, MD  Medications Prior to Admission  Medication Sig Dispense Refill Last Dose  . Potassium 99 MG TABS Take 99 mg by mouth daily.    Past Week at Unknown time  . aspirin EC 325 MG EC tablet Take 1 tablet (325 mg total) by mouth 2 (two) times daily after a meal. (Patient not taking: Reported on 01/20/2018) 60 tablet 0 Completed Course at Unknown time  . Calcium Carbonate-Vit D-Min (CALCIUM 600+D3 PLUS MINERALS) 600-800 MG-UNIT TABS Take 1 tablet by mouth daily.   01/18/2018  . cholecalciferol (VITAMIN D) 1000 units tablet Take 1,000 Units by mouth daily.   01/18/2018  . escitalopram (LEXAPRO) 5 MG tablet Take 5 mg by mouth at bedtime.   01/18/2018  . ferrous sulfate 325 (65 FE) MG tablet Take 325 mg by mouth daily with breakfast.   Not Taking at Unknown time  . Garlic 9622 MG CAPS Take 1,000 mg by mouth daily.    Not Taking at Unknown time  . Magnesium 250 MG TABS Take 250 mg by mouth daily.    Not Taking at Unknown time  . mesalamine (PENTASA) 500 MG CR  capsule Take 2,000 mg by mouth 2 (two) times daily.    Not Taking at Unknown time  . Multiple Vitamins-Minerals (MULTIVITAMIN WITH MINERALS) tablet Take 1 tablet by mouth daily.   Not Taking at Unknown time  . omeprazole (PRILOSEC) 40 MG capsule Take 40 mg by mouth at bedtime.    01/17/2018  . ondansetron (ZOFRAN) 4 MG tablet Take 1 tablet (4 mg total) by mouth every 8 (eight) hours as needed for nausea or vomiting. (Patient not taking: Reported on 11/12/2017) 30 tablet 0 Not Taking at Unknown time  . oxybutynin (DITROPAN-XL) 10 MG 24 hr tablet Take 10 mg by mouth at bedtime.    11/24/2017 at Unknown time  . oxyCODONE-acetaminophen (PERCOCET) 5-325 MG tablet Take 1-2 tablets by mouth every 4 (four) hours as needed for severe pain. (Patient not taking: Reported on 01/20/2018) 40 tablet 0 Completed Course at Unknown time  . tiZANidine (ZANAFLEX) 4 MG tablet Take 1 tablet (4 mg total) by mouth every 6 (six) hours as needed. (Patient not taking: Reported on 01/20/2018) 40 tablet 1 Completed Course at Unknown time  . vitamin B-12 (CYANOCOBALAMIN) 1000 MCG tablet Take 1,000 mcg by mouth daily.   Not Taking at Unknown time     Allergies  Allergen Reactions  . Fosamax [Alendronate Sodium] Other (See Comments)    Bones  hurt  . Lipitor [Atorvastatin] Other (See Comments)    Bone pain  . Codeine Other (See Comments)     Codeine in the liquid form causes gastritis  . Hydrocodone Nausea And Vomiting     Past Medical History:  Diagnosis Date  . Anemia   . Arthritis   . B12 deficiency   . Crohn disease ([place])   . Crohn's disease ([place])   . Depression   . Diverticulosis   . DJD (degenerative joint disease)   . DJD (degenerative joint disease)   . Dyspnea    with exertion (incline walking)  . Full dentures   . GERD (gastroesophageal reflux disease)   . Heart palpitations   . Hiatal hernia   . History of hiatal hernia   . Hypercholesterolemia   . Hyperlipidemia   . Incontinence of urine   .  Medical history non-contributory   . Nocturnal leg cramps   . Osteopenia   . Osteoporosis   . Pre-diabetes   . Reflux   . Sleep apnea    uses a cpap  . Wears glasses     Review of systems:      Physical Exam    Heart and lungs: Regular rate and rhythm without rub or gallop, lungs are bilaterally clear.    HEENT: Normocephalic atraumatic eyes are anicteric    Other:    Pertinant exam for procedure: Soft nontender nondistended bowel sounds positive normoactive.    Planned proceedures: EGD and indicated procedures. I have discussed the risks benefits and complications of procedures to include not limited to bleeding, infection, perforation and the risk of sedation and the patient wishes to proceed.    Kimberly Sails, MD Gastroenterology 01/20/2018  11:05 AM

## 2018-01-20 NOTE — Anesthesia Post-op Follow-up Note (Signed)
Anesthesia QCDR form completed.        

## 2018-01-20 NOTE — Transfer of Care (Signed)
Immediate Anesthesia Transfer of Care Note  Patient: Kimberly Andrade  Procedure(s) Performed: ESOPHAGOGASTRODUODENOSCOPY (EGD) WITH PROPOFOL (N/A )  Patient Location: PACU  Anesthesia Type:General  Level of Consciousness: awake, alert  and oriented  Airway & Oxygen Therapy: Patient Spontanous Breathing and Patient connected to nasal cannula oxygen  Post-op Assessment: Report given to RN and Post -op Vital signs reviewed and stable  Post vital signs: Reviewed and stable  Last Vitals:  Vitals Value Taken Time  BP    Temp    Pulse    Resp    SpO2      Last Pain:  Vitals:   01/20/18 1053  TempSrc: Tympanic  PainSc: 0-No pain         Complications: No apparent anesthesia complications

## 2018-01-20 NOTE — Op Note (Addendum)
[facility] Gastroenterology Patient Name: Kimberly Andrade Procedure Date: 01/20/2018 11:01 AM MRN: [id number] Account #: [id number] Date of Birth: 1945/11/14 Admit Type: Outpatient Age: 72 Room: [facility] ENDO ROOM 3 Gender: Female Note Status: Finalized Procedure:            Upper GI endoscopy Indications:          follow-up chronic gastritis and gastric nodule. Providers:            Lollie Sails, MD Referring MD:         Glendon Axe (Referring MD) Medicines:            Monitored Anesthesia Care Complications:        No immediate complications. Procedure:            Pre-Anesthesia Assessment:                       - ASA Grade Assessment: III - A patient with severe                        systemic disease.                       After obtaining informed consent, the endoscope was                        passed under direct vision. Throughout the procedure,                        the patient's blood pressure, pulse, and oxygen                        saturations were monitored continuously. The Endoscope                        was introduced through the mouth, with the intention of                        advancing to the duodenum. The scope was advanced to                        the gastric body before the procedure was aborted.                        Medications were given. The upper GI endoscopy was                        accomplished without difficulty. The patient tolerated                        the procedure well. Findings:      The examined esophagus was normal.      A large hiatal hernia was present. Ther is a paraesophageal component       and the diaphragmatic hiatus seems to be at about mid body. The scope       repetitively coiled in the upper portion of the stomach and I was unable       to introduce into the distal stomach. The mucosa appeared to be atrophic       in nature, and a cold biopsy was taken from the gastric body above the  apparent hiatus. Retroflex evaluation was not done due to abnormal       anatomy. Impression:           - Normal esophagus.                       - Large hiatal hernia. Recommendation:       - Discharge patient to home.                       - Do an upper GI series at appointment to be scheduled.                       - Consider CT scan chest and abdomen in regard to large                        hiatal hernia depending on result of UGIS.                       - Discharge patient to home.                       - Continue present medications.                       - Return to GI clinic in 4 weeks. Procedure Code(s):    --- Professional ---                       [id number], 57, Esophagogastroduodenoscopy, flexible,                        transoral; diagnostic, including collection of                        specimen(s) by brushing or washing, when performed                        (separate procedure) Diagnosis Code(s):    --- Professional ---                       K44.9, Diaphragmatic hernia without obstruction or                        gangrene CPT copyright 2018 American Medical Association. All rights reserved. The codes documented in this report are preliminary and upon coder review may  be revised to meet current compliance requirements. Lollie Sails, MD 01/20/2018 11:54:33 AM This report has been signed electronically. Number of Addenda: 0 Note Initiated On: 01/20/2018 11:01 AM      [facility]

## 2018-01-21 NOTE — Anesthesia Postprocedure Evaluation (Signed)
Anesthesia Post Note  Patient: SHANESHA BEDNARZ  Procedure(s) Performed: ESOPHAGOGASTRODUODENOSCOPY (EGD) WITH PROPOFOL (N/A )  Patient location during evaluation: PACU Anesthesia Type: General Level of consciousness: awake and alert Pain management: pain level controlled Vital Signs Assessment: post-procedure vital signs reviewed and stable Respiratory status: spontaneous breathing, nonlabored ventilation and respiratory function stable Cardiovascular status: blood pressure returned to baseline and stable Postop Assessment: no apparent nausea or vomiting Anesthetic complications: no     Last Vitals:  Vitals:   01/20/18 1158 01/20/18 1208  BP: 116/75 124/79  Pulse: 64 (!) 55  Resp: 14 14  Temp:    SpO2: (!) 89% 95%    Last Pain:  Vitals:   01/20/18 1208  TempSrc:   PainSc: 0-No pain                 Durenda Hurt

## 2018-01-22 ENCOUNTER — Encounter: Payer: Self-pay | Admitting: Gastroenterology

## 2018-01-22 LAB — SURGICAL PATHOLOGY

## 2018-01-26 ENCOUNTER — Other Ambulatory Visit: Payer: Self-pay | Admitting: Gastroenterology

## 2018-01-26 DIAGNOSIS — K449 Diaphragmatic hernia without obstruction or gangrene: Secondary | ICD-10-CM

## 2018-02-03 ENCOUNTER — Ambulatory Visit
Admission: RE | Admit: 2018-02-03 | Discharge: 2018-02-03 | Disposition: A | Discharge status: Home / Self Care | Payer: Medicare Other | Source: Ambulatory Visit | Attending: Gastroenterology | Admitting: Gastroenterology

## 2018-02-03 DIAGNOSIS — K449 Diaphragmatic hernia without obstruction or gangrene: Secondary | ICD-10-CM | POA: Diagnosis not present

## 2018-02-11 ENCOUNTER — Ambulatory Visit
Admission: RE | Admit: 2018-02-11 | Discharge: 2018-02-11 | Disposition: A | Discharge status: Home / Self Care | Payer: Medicare Other | Source: Ambulatory Visit | Attending: Gastroenterology | Admitting: Gastroenterology

## 2018-02-11 DIAGNOSIS — K449 Diaphragmatic hernia without obstruction or gangrene: Secondary | ICD-10-CM | POA: Diagnosis present

## 2018-02-11 LAB — POCT I-STAT CREATININE: Creatinine, Ser: 0.8 mg/dL (ref 0.44–1.00)

## 2018-02-11 MED ORDER — IOPAMIDOL (ISOVUE-300) INJECTION 61%
100.0000 mL | Freq: Once | INTRAVENOUS | Status: AC | PRN
  Administered 2018-02-11: 100 mL via INTRAVENOUS

## 2018-03-16 DIAGNOSIS — I700 Atherosclerosis of aorta: Secondary | ICD-10-CM | POA: Insufficient documentation

## 2018-07-16 HISTORY — PX: ROBOTIC ASSISTED LAPAROSCOPIC REPAIR OF PARAESOPHAGEAL HERNIA: SHX6606

## 2018-07-23 ENCOUNTER — Other Ambulatory Visit: Payer: Self-pay | Admitting: Internal Medicine

## 2018-07-23 ENCOUNTER — Encounter: Payer: Self-pay | Admitting: Internal Medicine

## 2018-07-24 ENCOUNTER — Telehealth: Payer: Self-pay

## 2018-07-24 ENCOUNTER — Encounter: Payer: Self-pay | Admitting: Internal Medicine

## 2018-07-24 ENCOUNTER — Other Ambulatory Visit: Payer: Self-pay

## 2018-07-24 ENCOUNTER — Ambulatory Visit (INDEPENDENT_AMBULATORY_CARE_PROVIDER_SITE_OTHER): Payer: Medicare Other | Admitting: Internal Medicine

## 2018-07-24 VITALS — BP 130/80 | HR 76 | Resp 16 | Ht 62.0 in | Wt 163.0 lb

## 2018-07-24 DIAGNOSIS — R7303 Prediabetes: Secondary | ICD-10-CM

## 2018-07-24 DIAGNOSIS — Z1231 Encounter for screening mammogram for malignant neoplasm of breast: Secondary | ICD-10-CM

## 2018-07-24 DIAGNOSIS — M810 Age-related osteoporosis without current pathological fracture: Secondary | ICD-10-CM

## 2018-07-24 DIAGNOSIS — R32 Unspecified urinary incontinence: Secondary | ICD-10-CM

## 2018-07-24 DIAGNOSIS — K219 Gastro-esophageal reflux disease without esophagitis: Secondary | ICD-10-CM

## 2018-07-24 DIAGNOSIS — E782 Mixed hyperlipidemia: Secondary | ICD-10-CM

## 2018-07-24 DIAGNOSIS — K5000 Crohn's disease of small intestine without complications: Secondary | ICD-10-CM

## 2018-07-24 DIAGNOSIS — I700 Atherosclerosis of aorta: Secondary | ICD-10-CM

## 2018-07-24 DIAGNOSIS — G4733 Obstructive sleep apnea (adult) (pediatric): Secondary | ICD-10-CM | POA: Diagnosis not present

## 2018-07-24 NOTE — Progress Notes (Signed)
Date:  07/24/2018   Name:  Kimberly Andrade   DOB:  August 13, 1945   MRN:  [id number]   Chief Complaint: Establish [facility] no women PCP there ) She is due for a mammogram - denies breast issues. No longer needs pelvic or pap exams. DEXA ordered by Endocrinology  Gastroesophageal Reflux  She reports no abdominal pain, no coughing or no wheezing. Pertinent negatives include no fatigue. She has tried a PPI for the symptoms. The treatment provided significant relief. had HH repair last week and is doing very well.  Hyperlipidemia  This is a chronic problem. The problem is controlled. Pertinent negatives include no shortness of breath. Current antihyperlipidemic treatment includes statins. The current treatment provides significant improvement of lipids.  Diabetes  She presents for her follow-up diabetic visit. Diabetes type: pre-diabetes. Pertinent negatives for hypoglycemia include no dizziness, headaches or nervousness/anxiousness. Pertinent negatives for diabetes include no fatigue. Current diabetic treatment includes diet.  Crohns disease - she is on oral medication with excellent control of symptoms.  She is followed by GI for this problem. OP - she is followed by Endocrinology for Prolia therapy.  DEXA for follow up with be ordered by Endo as well.  She recently started on Vitamin D when level returned low.  She is also take calcium. OAB - on ditropan with good results.   Lab Results  Component Value Date   HGBA1C 6.2 (H) 11/14/2017    Review of Systems  Constitutional: Negative for chills, fatigue, fever and unexpected weight change.  HENT: Negative for trouble swallowing.   Eyes: Negative for visual disturbance.  Respiratory: Negative for cough, chest tightness, shortness of breath and wheezing.   Gastrointestinal: Negative for abdominal pain, blood in stool, constipation and diarrhea.  Genitourinary: Positive for urgency. Negative for difficulty urinating.  Musculoskeletal:  Negative for gait problem and joint swelling.  Skin: Negative for color change and rash.  Allergic/Immunologic: Negative for environmental allergies.  Neurological: Negative for dizziness, light-headedness and headaches.  Psychiatric/Behavioral: Negative for dysphoric mood and sleep disturbance. The patient is not nervous/anxious.     Patient Active Problem List   Diagnosis Date Noted  . Atherosclerosis of abdominal aorta ([place]) 03/16/2018  . Primary osteoarthritis of left hip 11/25/2017  . Depression, major, single episode, complete remission ([place]) 10/28/2017  . DDD (degenerative disc disease), 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/2017  . Prediabetes 12/25/2016  . Nocturnal leg cramps 12/25/2016  . 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 Urinary incontinence in female 10/16/2016  . 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 Mixed hyperlipidemia 10/05/2015  . GERD (gastroesophageal reflux disease) 04/24/2015  . Crohn's disease of small intestine without complication ([place]) 71/69/6789  . Vitamin B 12 deficiency 06/21/2014  . Obstructive sleep apnea (adult) (pediatric) 11/18/2013    Allergies  Allergen Reactions  . Fosamax [Alendronate Sodium] Other (See Comments)    Bones hurt  . Lipitor [Atorvastatin] Other (See Comments)    Bone pain  . Codeine Other (See Comments)     Codeine in the liquid form causes gastritis  . Hydrocodone Nausea And Vomiting    Past Surgical History:  Procedure Laterality Date  . ABDOMINAL HYSTERECTOMY    . APPENDECTOMY    . CARPEL TUNNEL    . CHOLECYSTECTOMY    . COLONOSCOPY WITH PROPOFOL N/A 03/11/2016   Procedure: COLONOSCOPY WITH PROPOFOL;  Surgeon: Lollie Sails, MD;  Location: [facility] ENDOSCOPY;  Service: Endoscopy;  Laterality: N/A;  . DILATION AND CURETTAGE OF UTERUS    . ESOPHAGOGASTRODUODENOSCOPY N/A 03/11/2016  Procedure: ESOPHAGOGASTRODUODENOSCOPY (EGD);  Surgeon: Lollie Sails, MD;  [facility] END[facility]COPY;  Service: Endoscopy;  Laterality: N/A;  .  ESOPHAGOGASTRODUODENOSCOPY (EGD) WITH PROPOFOL N/A 11/11/2017   Procedure: ESOPHAGOGASTRODUODENOSCOPY (EGD) WITH PROPOFOL;  Surgeon: Lollie Sails, MD;  Location: [facility] ENDOSCOPY;  Service: Endoscopy;  Laterality: N/A;  . ESOPHAGOGASTRODUODENOSCOPY (EGD) WITH PROPOFOL N/A 01/20/2018   Procedure: ESOPHAGOGASTRODUODENOSCOPY (EGD) WITH PROPOFOL;  Surgeon: Lollie Sails, MD;  Location: [facility] ENDOSCOPY;  Service: Endoscopy;  Laterality: N/A;  . FOOT SURGERY Right   . HAMMER TOE SURGERY    . JOINT REPLACEMENT Right 2006   shoulder  . KNEE ARTHROSCOPY Right 05/25/2013   Procedure: ARTHROSCOPY KNEE;  Surgeon: Hessie Dibble, MD;  Location: [place];  Service: Orthopedics;  Laterality: Right;  medial and lateral meniscal debridment and chondroplasty  . ROBOTIC ASSISTED LAPAROSCOPIC REPAIR OF PARAESOPHAGEAL HERNIA  07/16/2018  . SHOULDER ARTHROSCOPY WITH ROTATOR CUFF REPAIR AND SUBACROMIAL DECOMPRESSION Left 08/14/2017   Procedure: SHOULDER ARTHROSCOPY WITH ROTATOR CUFF REPAIR AND SUBACROMIAL DECOMPRESSION;  Surgeon: Tania Ade, MD;  Location: [employer];  Service: Orthopedics;  Laterality: Left;  . SHOULDER SURGERY Right    X 3  . TOTAL HIP ARTHROPLASTY Left 11/25/2017   Procedure: LEFT TOTAL HIP ARTHROPLASTY ANTERIOR APPROACH;  Surgeon: Melrose Nakayama, MD;  Location: [place];  Service: Orthopedics;  Laterality: Left;    Social History   Tobacco Use  . Smoking status: Former Smoker    Last attempt to quit: 12/29/2001    Years since quitting: 16.5  . Smokeless tobacco: Never Used  Substance Use Topics  . Alcohol use: No  . Drug use: No     Medication list has been reviewed and updated.  Current Meds  Medication Sig  . acetaminophen (TYLENOL) 325 MG tablet Take by mouth.  . Calcium Carbonate-Vit D-Min (CALCIUM 600+D3 PLUS MINERALS) 600-800 MG-UNIT TABS Take 1 tablet by mouth daily.  . cholecalciferol (VITAMIN D) 1000 units tablet Take 1,000 Units by mouth daily.  .  Magnesium 250 MG TABS Take 250 mg by mouth daily.   . mesalamine (PENTASA) 500 MG CR capsule Take 2,000 mg by mouth 2 (two) times daily.   . Multiple Vitamins-Minerals (MULTIVITAMIN WITH MINERALS) tablet Take 1 tablet by mouth daily.  . naproxen sodium (ALEVE) 220 MG tablet Take by mouth.  Marland Kitchen omeprazole (PRILOSEC) 40 MG capsule Take 40 mg by mouth at bedtime.   . ondansetron (ZOFRAN) 4 MG tablet Take 1 tablet (4 mg total) by mouth every 8 (eight) hours as needed for nausea or vomiting.  Marland Kitchen oxybutynin (DITROPAN-XL) 10 MG 24 hr tablet Take 10 mg by mouth at bedtime.   . Potassium 99 MG TABS Take 99 mg by mouth daily.   . pravastatin (PRAVACHOL) 20 MG tablet Take 20 mg by mouth at bedtime.  . vitamin B-12 (CYANOCOBALAMIN) 1000 MCG tablet Take 1,000 mcg by mouth daily.  . [DISCONTINUED] escitalopram (LEXAPRO) 5 MG tablet Take 5 mg by mouth at bedtime.  . [DISCONTINUED] ferrous sulfate 325 (65 FE) MG tablet Take 325 mg by mouth daily with breakfast.  . [DISCONTINUED] Garlic 2706 MG CAPS Take 1,000 mg by mouth daily.   . [DISCONTINUED] senna-docusate (SENOKOT-S) 8.6-50 MG tablet Take by mouth.  . [DISCONTINUED] tiZANidine (ZANAFLEX) 4 MG tablet Take 1 tablet (4 mg total) by mouth every 6 (six) hours as needed.    PHQ 2/9 Scores 07/24/2018  PHQ - 2 Score 0  PHQ- 9 Score 0    BP Readings  from Last 3 Encounters:  07/24/18 130/80  01/20/18 124/79  11/27/17 (!) 99/53    Physical Exam Vitals signs and nursing note reviewed.  Constitutional:      General: She is not in acute distress.    Appearance: Normal appearance. She is well-developed.  HENT:     Head: Normocephalic and atraumatic.  Neck:     Musculoskeletal: Normal range of motion.     Vascular: No carotid bruit.  Cardiovascular:     Rate and Rhythm: Normal rate and regular rhythm.     Heart sounds: No murmur.  Pulmonary:     Effort: Pulmonary effort is normal. No respiratory distress.     Breath sounds: No wheezing or rhonchi.   Abdominal:     General: Bowel sounds are normal.     Palpations: Abdomen is soft.     Tenderness: There is no abdominal tenderness.    Musculoskeletal: Normal range of motion.  Lymphadenopathy:     Cervical: No cervical adenopathy.  Skin:    General: Skin is warm and dry.     Findings: No rash.  Neurological:     Mental Status: She is alert and oriented to person, place, and time.  Psychiatric:        Attention and Perception: Attention normal.        Mood and Affect: Mood normal.        Behavior: Behavior normal.        Thought Content: Thought content normal.     Wt Readings from Last 3 Encounters:  07/24/18 163 lb (73.9 kg)  01/20/18 161 lb (73 kg)  11/25/17 167 lb (75.8 kg)    BP 130/80   Pulse 76   Resp 16   Ht 5\' 2"  (1.575 m)   Wt 163 lb (73.9 kg)   SpO2 95%   BMI 29.81 kg/m   Assessment and Plan: 1. Gastroesophageal reflux disease without esophagitis Continue PPI; may be able to stop since [facility] surgery  2. Mixed hyperlipidemia Continue statin therapy  3. Prediabetes Limit carbs, and work on [employer] loss/exercise Recheck at next visit  4. Obstructive sleep apnea of adult Doing well on CPAP  5. Postmenopausal osteoporosis Continue calcium, vitamin D, Prolia  6. Encounter for screening mammogram for breast cancer - MM 3D SCREEN BREAST BILATERAL; Future  7. Urinary incontinence in female Doing well on ditropan  8. Atherosclerosis of abdominal aorta (HCC) Continue statin therapy  9. Crohn's disease of small intestine without complication ([place]) Stable controlled symptoms - continue GI follow up   Partially dictated using Editor, commissioning. Any errors are unintentional.  Halina Maidens, MD [facility]  07/24/2018

## 2018-09-08 ENCOUNTER — Other Ambulatory Visit: Payer: Self-pay

## 2018-09-08 ENCOUNTER — Ambulatory Visit
Admission: RE | Admit: 2018-09-08 | Discharge: 2018-09-08 | Disposition: A | Discharge status: Home / Self Care | Payer: Medicare Other | Source: Ambulatory Visit | Attending: Internal Medicine | Admitting: Internal Medicine

## 2018-09-08 DIAGNOSIS — Z1231 Encounter for screening mammogram for malignant neoplasm of breast: Secondary | ICD-10-CM | POA: Diagnosis not present

## 2018-09-11 NOTE — Telephone Encounter (Signed)
ERROR

## 2018-12-09 ENCOUNTER — Encounter: Payer: Medicare Other | Admitting: Internal Medicine

## 2018-12-11 ENCOUNTER — Ambulatory Visit (INDEPENDENT_AMBULATORY_CARE_PROVIDER_SITE_OTHER): Payer: Medicare Other | Admitting: Internal Medicine

## 2018-12-11 ENCOUNTER — Encounter: Payer: Self-pay | Admitting: Internal Medicine

## 2018-12-11 ENCOUNTER — Other Ambulatory Visit: Payer: Self-pay

## 2018-12-11 VITALS — BP 138/78 | HR 69 | Ht 62.0 in | Wt 172.0 lb

## 2018-12-11 DIAGNOSIS — E782 Mixed hyperlipidemia: Secondary | ICD-10-CM | POA: Diagnosis not present

## 2018-12-11 DIAGNOSIS — G4733 Obstructive sleep apnea (adult) (pediatric): Secondary | ICD-10-CM

## 2018-12-11 DIAGNOSIS — K5000 Crohn's disease of small intestine without complications: Secondary | ICD-10-CM | POA: Diagnosis not present

## 2018-12-11 DIAGNOSIS — I10 Essential (primary) hypertension: Secondary | ICD-10-CM

## 2018-12-11 DIAGNOSIS — K219 Gastro-esophageal reflux disease without esophagitis: Secondary | ICD-10-CM | POA: Diagnosis not present

## 2018-12-11 DIAGNOSIS — R7303 Prediabetes: Secondary | ICD-10-CM

## 2018-12-11 DIAGNOSIS — Z23 Encounter for immunization: Secondary | ICD-10-CM

## 2018-12-11 LAB — POCT URINALYSIS DIPSTICK
Bilirubin, UA: NEGATIVE
Blood, UA: NEGATIVE
Glucose, UA: NEGATIVE
Ketones, UA: NEGATIVE
Leukocytes, UA: NEGATIVE
Nitrite, UA: NEGATIVE
Protein, UA: NEGATIVE
Spec Grav, UA: 1.015 (ref 1.010–1.025)
Urobilinogen, UA: 0.2 E.U./dL
pH, UA: 6.5 (ref 5.0–8.0)

## 2018-12-11 MED ORDER — LOSARTAN POTASSIUM 50 MG PO TABS: 50.0000 mg | ORAL_TABLET | Freq: Every day | ORAL | 0 refills | Status: DC

## 2018-12-11 MED ORDER — PRAVASTATIN SODIUM 20 MG PO TABS: 20.0000 mg | ORAL_TABLET | Freq: Every day | ORAL | 1 refills | Status: DC

## 2018-12-11 MED ORDER — OXYBUTYNIN CHLORIDE ER 10 MG PO TB24: 10.0000 mg | ORAL_TABLET | Freq: Every day | ORAL | 1 refills | Status: DC

## 2018-12-11 NOTE — Patient Instructions (Signed)

## 2018-12-11 NOTE — Progress Notes (Signed)
Date:  12/11/2018   Name:  Kimberly Andrade   DOB:  [birth year]   MRN:  [id number]   Chief Complaint: Annual Exam (Breast Exam.) Kimberly Andrade is a 73 y.o. female who presents today for her annual exam. She feels well. She reports exercising some walking. She reports she is sleeping well. She is concerned about her BP which has been higher lately. Several months ago at Pulmonary visit it was high and at home with an old cuff as well.  Mammogram 09/2018 Colonoscopy  03/2016 DEXA  08/2016 Immunizations UTD  Diabetes She presents for her follow-up diabetic visit. Diabetes type: pre-diabetes. Her disease course has been stable. Pertinent negatives for hypoglycemia include no dizziness, headaches, nervousness/anxiousness or tremors. Pertinent negatives for diabetes include no chest pain, no fatigue, no polydipsia and no polyuria. When asked about current treatments, none were reported. She is compliant with treatment most of the time. Her weight is stable. An ACE inhibitor/angiotensin II receptor blocker is not being taken.  Gastroesophageal Reflux She complains of heartburn. She reports no abdominal pain, no chest pain, no coughing or no wheezing. This is a recurrent problem. Pertinent negatives include no fatigue. She has tried a PPI for the symptoms. The treatment provided significant relief.  Hyperlipidemia The problem is controlled. Pertinent negatives include no chest pain or shortness of breath. Current antihyperlipidemic treatment includes statins. The current treatment provides significant improvement of lipids. There are no compliance problems.   Crohn's disease - followed by GI. She reports mild left sided abd pain. She has intermittent diarrhea but no blood or mucus. HTN - BP has been much higher over the past few months.  Never had issues with BP in the past.  No change in diet, no supplements or herbals that might contribute.  Lab Results  Component Value Date   HGBA1C  6.2 (H) 11/14/2017   Lab Results  Component Value Date   CREATININE 0.80 02/11/2018   BUN 15 11/14/2017   NA 139 11/14/2017   K 4.1 11/14/2017   CL 108 11/14/2017   CO2 22 11/14/2017   No results found for: CHOL, HDL, LDLCALC, LDLDIRECT, TRIG, CHOLHDL   Review of Systems  Constitutional: Negative for chills, fatigue and fever.  HENT: Negative for congestion, hearing loss, tinnitus, trouble swallowing and voice change.   Eyes: Negative for visual disturbance.  Respiratory: Negative for cough, chest tightness, shortness of breath and wheezing.   Cardiovascular: Negative for chest pain, palpitations and leg swelling.  Gastrointestinal: Positive for heartburn. Negative for abdominal pain, constipation, diarrhea and vomiting.  Endocrine: Negative for polydipsia and polyuria.  Genitourinary: Negative for difficulty urinating (ditropan is helping oab), dysuria, frequency, genital sores, vaginal bleeding and vaginal discharge.  Musculoskeletal: Negative for arthralgias, gait problem and joint swelling.  Skin: Negative for color change and rash.  Neurological: Negative for dizziness, tremors, light-headedness and headaches.  Hematological: Negative for adenopathy. Does not bruise/bleed easily.  Psychiatric/Behavioral: Negative for dysphoric mood and sleep disturbance. The patient is not nervous/anxious.     Patient Active Problem List   Diagnosis Date Noted  . Atherosclerosis of abdominal aorta ([place]) 03/16/2018  . Primary osteoarthritis of left hip 11/25/2017  . DDD (degenerative disc disease), 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/2017  . Prediabetes 12/25/2016  . Nocturnal leg cramps 12/25/2016  . 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 Urinary incontinence in female 10/16/2016  . 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 Mixed hyperlipidemia 10/05/2015  . GERD (gastroesophageal reflux disease) 04/24/2015  . Crohn's disease of small intestine  without complication ([place]) 123456  . Vitamin B 12 deficiency  06/21/2014  . Obstructive sleep apnea of adult 11/18/2013    Allergies  Allergen Reactions  . Fosamax [Alendronate Sodium] Other (See Comments)    Bones hurt  . Lipitor [Atorvastatin] Other (See Comments)    Bone pain  . Codeine Other (See Comments)     Codeine in the liquid form causes gastritis  . Hydrocodone Nausea And Vomiting    Past Surgical History:  Procedure Laterality Date  . ABDOMINAL HYSTERECTOMY    . APPENDECTOMY    . CARPEL TUNNEL    . CHOLECYSTECTOMY    . COLONOSCOPY WITH PROPOFOL N/A 03/11/2016   Procedure: COLONOSCOPY WITH PROPOFOL;  Surgeon: Lollie Sails, MD;  Location: [facility] ENDOSCOPY;  Service: Endoscopy;  Laterality: N/A;  . DILATION AND CURETTAGE OF UTERUS    . ESOPHAGOGASTRODUODENOSCOPY N/A 03/11/2016   Procedure: ESOPHAGOGASTRODUODENOSCOPY (EGD);  Surgeon: Lollie Sails, MD;  Location: [facility] ENDOSCOPY;  Service: Endoscopy;  Laterality: N/A;  . ESOPHAGOGASTRODUODENOSCOPY (EGD) WITH PROPOFOL N/A 11/11/2017   Procedure: ESOPHAGOGASTRODUODENOSCOPY (EGD) WITH PROPOFOL;  Surgeon: Lollie Sails, MD;  Location: [facility] ENDOSCOPY;  Service: Endoscopy;  Laterality: N/A;  . ESOPHAGOGASTRODUODENOSCOPY (EGD) WITH PROPOFOL N/A 01/20/2018   Procedure: ESOPHAGOGASTRODUODENOSCOPY (EGD) WITH PROPOFOL;  Surgeon: Lollie Sails, MD;  Location: [facility] ENDOSCOPY;  Service: Endoscopy;  Laterality: N/A;  . FOOT SURGERY Right   . HAMMER TOE SURGERY    . JOINT REPLACEMENT Right 2006   shoulder  . KNEE ARTHROSCOPY Right 05/25/2013   Procedure: ARTHROSCOPY KNEE;  Surgeon: Hessie Dibble, MD;  Location: [place];  Service: Orthopedics;  Laterality: Right;  medial and lateral meniscal debridment and chondroplasty  . ROBOTIC ASSISTED LAPAROSCOPIC REPAIR OF PARAESOPHAGEAL HERNIA  07/16/2018  . SHOULDER ARTHROSCOPY WITH ROTATOR CUFF REPAIR AND SUBACROMIAL DECOMPRESSION Left 08/14/2017   Procedure: SHOULDER ARTHROSCOPY WITH ROTATOR CUFF REPAIR AND SUBACROMIAL  DECOMPRESSION;  Surgeon: Tania Ade, MD;  Location: [place];  Service: Orthopedics;  Laterality: Left;  . SHOULDER SURGERY Right    X 3  . TOTAL HIP ARTHROPLASTY Left 11/25/2017   Procedure: LEFT TOTAL HIP ARTHROPLASTY ANTERIOR APPROACH;  Surgeon: Melrose Nakayama, MD;  Location: [place];  Service: Orthopedics;  Laterality: Left;    Social History   Tobacco Use  . Smoking status: Former Smoker    Quit date: 12/29/2001    Years since quitting: 16.9  . Smokeless tobacco: Never Used  Substance Use Topics  . Alcohol use: No  . Drug use: No     Medication list has been reviewed and updated.  Current Meds  Medication Sig  . Calcium Carbonate-Vit D-Min (CALCIUM 600+D3 PLUS MINERALS) 600-800 MG-UNIT TABS Take 1 tablet by mouth daily.  . cholecalciferol (VITAMIN D) 1000 units tablet Take 1,000 Units by mouth daily.  . Magnesium 250 MG TABS Take 250 mg by mouth daily.   . mesalamine (PENTASA) 500 MG CR capsule Take 2,000 mg by mouth 2 (two) times daily.   . Multiple Vitamins-Minerals (MULTIVITAMIN WITH MINERALS) tablet Take 1 tablet by mouth daily.  . naproxen sodium (ALEVE) 220 MG tablet Take by mouth.  . NON FORMULARY BiPap nightly  . omeprazole (PRILOSEC) 40 MG capsule Take 40 mg by mouth at bedtime.   . ondansetron (ZOFRAN) 4 MG tablet Take 1 tablet (4 mg total) by mouth every 8 (eight) hours as needed for nausea or vomiting.  Marland Kitchen oxybutynin (DITROPAN-XL) 10 MG 24 hr tablet Take 10 mg by mouth at bedtime.   Marland Kitchen  Potassium 99 MG TABS Take 99 mg by mouth daily.   . pravastatin (PRAVACHOL) 20 MG tablet Take 20 mg by mouth at bedtime.  . vitamin B-12 (CYANOCOBALAMIN) 1000 MCG tablet Take 1,000 mcg by mouth daily.    PHQ 2/9 Scores 12/11/2018 07/24/2018  PHQ - 2 Score 0 0  PHQ- 9 Score - 0    BP Readings from Last 3 Encounters:  12/11/18 138/78  07/24/18 130/80  01/20/18 124/79    Physical Exam Vitals signs and nursing note reviewed.  Constitutional:      General: She is not in  acute distress.    Appearance: She is well-developed.  HENT:     Head: Normocephalic and atraumatic.     Right Ear: Tympanic membrane and ear canal normal.     Left Ear: Tympanic membrane and ear canal normal.     Nose:     Right Sinus: No maxillary sinus tenderness.     Left Sinus: No maxillary sinus tenderness.  Eyes:     General: No scleral icterus.       Right eye: No discharge.        Left eye: No discharge.     Conjunctiva/sclera: Conjunctivae normal.  Neck:     Musculoskeletal: Normal range of motion. No erythema.     Thyroid: No thyromegaly.     Vascular: No carotid bruit.  Cardiovascular:     Rate and Rhythm: Normal rate and regular rhythm.     Pulses: Normal pulses.     Heart sounds: Normal heart sounds.  Pulmonary:     Effort: Pulmonary effort is normal. No respiratory distress.     Breath sounds: Normal breath sounds. No wheezing.  Chest:     Breasts:        Right: No mass, nipple discharge, skin change or tenderness.        Left: No mass, nipple discharge, skin change or tenderness.  Abdominal:     General: Bowel sounds are normal.     Palpations: Abdomen is soft.     Tenderness: There is no abdominal tenderness. There is no guarding or rebound.  Musculoskeletal: Normal range of motion.  Lymphadenopathy:     Cervical: No cervical adenopathy.  Skin:    General: Skin is warm and dry.     Findings: No rash.  Neurological:     Mental Status: She is alert and oriented to person, place, and time.     Cranial Nerves: No cranial nerve deficit.     Sensory: No sensory deficit.     Deep Tendon Reflexes: Reflexes are normal and symmetric.  Psychiatric:        Speech: Speech normal.        Behavior: Behavior normal.        Thought Content: Thought content normal.     Wt Readings from Last 3 Encounters:  12/11/18 172 lb (78 kg)  07/24/18 163 lb (73.9 kg)  01/20/18 161 lb (73 kg)    BP 138/78   Pulse 69   Ht 5\' 2"  (1.575 m)   Wt 172 lb (78 kg)   SpO2 95%    BMI 31.46 kg/m   Assessment and Plan: 1. Gastroesophageal reflux disease without esophagitis Symptoms well controlled on daily PPI No red flag signs such as weight loss, n/v, melena Will continue current therapy. - CBC with Differential/Platelet  2. Mixed hyperlipidemia Continue healthy diet and exercise - will get lipid panel on current dose of pravachol 20 mg.  Goal  LDL <100 unless diabetic. - Lipid panel - pravastatin (PRAVACHOL) 20 MG tablet; Take 1 tablet (20 mg total) by mouth at bedtime.  Dispense: 90 tablet; Refill: 1  3. Prediabetes Continue current diet and exercise to avoid further weight gain Check labs and advise if medication is needed - Comprehensive metabolic panel - Hemoglobin A1c - TSH - POCT urinalysis dipstick  4. Crohn's disease of small intestine without complication ([place]) Symptoms are stable and mild on current regiment of Pentasa Followed closely by GI - likely due soon for colonoscopy.  5. Essential hypertension This appears to be new-onset. Will begin losartan 50 mg Pt will check BP at home several times a week and follow up for recheck in 2 months. - losartan (COZAAR) 50 MG tablet; Take 1 tablet (50 mg total) by mouth daily.  Dispense: 90 tablet; Refill: 0  6. Obstructive sleep apnea of adult On BiPap and doing very well by her report    Partially dictated using Editor, commissioning. Any errors are unintentional.  Halina Maidens, MD [facility]  12/11/2018

## 2018-12-12 LAB — CBC WITH DIFFERENTIAL/PLATELET
Basophils Absolute: 0.1 10*3/uL (ref 0.0–0.2)
Basos: 1 %
EOS (ABSOLUTE): 0.4 10*3/uL (ref 0.0–0.4)
Eos: 5 %
Hematocrit: 40.4 % (ref 34.0–46.6)
Hemoglobin: 13.4 g/dL (ref 11.1–15.9)
Immature Grans (Abs): 0 10*3/uL (ref 0.0–0.1)
Immature Granulocytes: 1 %
Lymphocytes Absolute: 1.8 10*3/uL (ref 0.7–3.1)
Lymphs: 26 %
MCH: 29.5 pg (ref 26.6–33.0)
MCHC: 33.2 g/dL (ref 31.5–35.7)
MCV: 89 fL (ref 79–97)
Monocytes Absolute: 0.5 10*3/uL (ref 0.1–0.9)
Monocytes: 7 %
Neutrophils Absolute: 4.1 10*3/uL (ref 1.4–7.0)
Neutrophils: 60 %
Platelets: 284 10*3/uL (ref 150–450)
RBC: 4.55 x10E6/uL (ref 3.77–5.28)
RDW: 13.9 % (ref 11.7–15.4)
WBC: 6.8 10*3/uL (ref 3.4–10.8)

## 2018-12-12 LAB — COMPREHENSIVE METABOLIC PANEL
ALT: 15 IU/L (ref 0–32)
AST: 18 IU/L (ref 0–40)
Albumin/Globulin Ratio: 2 (ref 1.2–2.2)
Albumin: 4.4 g/dL (ref 3.7–4.7)
Alkaline Phosphatase: 38 IU/L — ABNORMAL LOW (ref 39–117)
BUN/Creatinine Ratio: 16 (ref 12–28)
BUN: 16 mg/dL (ref 8–27)
Bilirubin Total: 0.4 mg/dL (ref 0.0–1.2)
CO2: 23 mmol/L (ref 20–29)
Calcium: 9.9 mg/dL (ref 8.7–10.3)
Chloride: 108 mmol/L — ABNORMAL HIGH (ref 96–106)
Creatinine, Ser: 0.99 mg/dL (ref 0.57–1.00)
GFR calc Af Amer: 65 mL/min/{1.73_m2} (ref >59)
GFR calc non Af Amer: 57 mL/min/{1.73_m2} — ABNORMAL LOW (ref >59)
Globulin, Total: 2.2 g/dL (ref 1.5–4.5)
Glucose: 111 mg/dL — ABNORMAL HIGH (ref 65–99)
Potassium: 4.3 mmol/L (ref 3.5–5.2)
Sodium: 146 mmol/L — ABNORMAL HIGH (ref 134–144)
Total Protein: 6.6 g/dL (ref 6.0–8.5)

## 2018-12-12 LAB — LIPID PANEL
Chol/HDL Ratio: 3.4 ratio (ref 0.0–4.4)
Cholesterol, Total: 185 mg/dL (ref 100–199)
HDL: 55 mg/dL (ref >39)
LDL Chol Calc (NIH): 103 mg/dL — ABNORMAL HIGH (ref 0–99)
Triglycerides: 155 mg/dL — ABNORMAL HIGH (ref 0–149)
VLDL Cholesterol Cal: 27 mg/dL (ref 5–40)

## 2018-12-12 LAB — HEMOGLOBIN A1C
Est. average glucose Bld gHb Est-mCnc: 120 mg/dL
Hgb A1c MFr Bld: 5.8 % — ABNORMAL HIGH (ref 4.8–5.6)

## 2018-12-12 LAB — TSH: TSH: 2.25 u[IU]/mL (ref 0.450–4.500)

## 2018-12-15 ENCOUNTER — Encounter: Payer: Self-pay | Admitting: Internal Medicine

## 2019-02-17 ENCOUNTER — Ambulatory Visit: Payer: Medicare Other

## 2019-02-17 ENCOUNTER — Ambulatory Visit: Payer: Medicare Other | Admitting: Internal Medicine

## 2019-02-23 ENCOUNTER — Encounter: Payer: Self-pay | Admitting: Internal Medicine

## 2019-02-23 ENCOUNTER — Other Ambulatory Visit: Payer: Self-pay | Admitting: Internal Medicine

## 2019-02-23 DIAGNOSIS — I10 Essential (primary) hypertension: Secondary | ICD-10-CM

## 2019-02-23 MED ORDER — LOSARTAN POTASSIUM 50 MG PO TABS: 50.0000 mg | ORAL_TABLET | Freq: Every day | ORAL | 0 refills | Status: DC

## 2019-02-24 ENCOUNTER — Ambulatory Visit: Payer: Medicare Other | Admitting: Internal Medicine

## 2019-03-01 ENCOUNTER — Other Ambulatory Visit: Payer: Self-pay | Admitting: Internal Medicine

## 2019-03-01 DIAGNOSIS — I10 Essential (primary) hypertension: Secondary | ICD-10-CM

## 2019-03-01 MED ORDER — LOSARTAN POTASSIUM 50 MG PO TABS: 50.0000 mg | ORAL_TABLET | Freq: Every day | ORAL | 0 refills | Status: DC

## 2019-03-10 ENCOUNTER — Ambulatory Visit (INDEPENDENT_AMBULATORY_CARE_PROVIDER_SITE_OTHER): Payer: Medicare Other

## 2019-03-10 VITALS — BP 138/87 | Temp 98.0°F | Ht 62.0 in | Wt 165.0 lb

## 2019-03-10 DIAGNOSIS — Z0000 Encounter for general adult medical examination without abnormal findings: Secondary | ICD-10-CM | POA: Diagnosis not present

## 2019-03-10 NOTE — Progress Notes (Signed)
Subjective:   Kimberly Andrade is a 74 y.o. female who presents for Medicare Annual (Subsequent) preventive examination.  Virtual Visit via Telephone Note  I connected with Kimberly Andrade on 03/10/19 at 10:40 AM EST by telephone and verified that I am speaking with the correct person using two identifiers.  Medicare Annual Wellness visit completed telephonically due to Covid-19 pandemic.   Location: Patient: home Provider: office   I discussed the limitations, risks, security and privacy concerns of performing an evaluation and management service by telephone and the availability of in person appointments. The patient expressed understanding and agreed to proceed.  Some vital signs may be absent or patient reported.   Clemetine Marker, LPN    Review of Systems:   Cardiac Risk Factors include: advanced age (>46men, >73 women);dyslipidemia;obesity (BMI >30kg/m2);hypertension     Objective:     Vitals: BP 138/87   Temp 98 F (36.7 C)   Ht 5\' 2"  (1.575 m)   Wt 165 lb (74.8 kg)   BMI 30.18 kg/m   Body mass index is 30.18 kg/m.  Advanced Directives 03/10/2019 01/20/2018 11/25/2017 11/14/2017 11/11/2017 08/08/2017 04/29/2017  Does Patient Have a Medical Advance Directive? No No No No No No No  Would patient like information on creating a medical advance directive? Yes (MAU/Ambulatory/Procedural Areas - Information given) No - Patient declined No - Patient declined Yes (MAU/Ambulatory/Procedural Areas - Information given) - Yes (MAU/Ambulatory/Procedural Areas - Information given) No - Patient declined    Tobacco Social History   Tobacco Use  Smoking Status Former Smoker  . Quit date: 12/29/2001  . Years since quitting: 17.2  Smokeless Tobacco Never Used     Counseling given: Not Answered   Clinical Intake:  Pre-visit preparation completed: Yes  Pain : No/denies pain     BMI - recorded: 30.18 Nutritional Status: BMI > 30  Obese Nutritional Risks: None  Diabetes: No  How often do you need to have someone help you when you read instructions, pamphlets, or other written materials from your doctor or pharmacy?: 1 - Never  Interpreter Needed?: No  Information entered by :: Clemetine Marker LPN  Past Medical History:  Diagnosis Date  . Anemia   . Arthritis   . B12 deficiency   . Crohn disease ([place])   . Crohn's disease ([place])   . Depression   . Depression, major, single episode, complete remission ([place]) 10/28/2017  . Diverticulosis   . DJD (degenerative joint disease)   . DJD (degenerative joint disease)   . Dyspnea    with exertion (incline walking)  . Full dentures   . GERD (gastroesophageal reflux disease)   . Heart palpitations   . Hiatal hernia   . History of hiatal hernia   . Hypercholesterolemia   . Hyperlipidemia   . Incontinence of urine   . Medical history non-contributory   . Nocturnal leg cramps   . Osteopenia   . Osteoporosis   . Pre-diabetes   . Reflux   . Sleep apnea    uses a cpap  . Wears glasses    Past Surgical History:  Procedure Laterality Date  . ABDOMINAL HYSTERECTOMY    . APPENDECTOMY    . CARPEL TUNNEL    . CHOLECYSTECTOMY    . COLONOSCOPY WITH PROPOFOL N/A 03/11/2016   Procedure: COLONOSCOPY WITH PROPOFOL;  Surgeon: Lollie Sails, MD;  Location: [facility] ENDOSCOPY;  Service: Endoscopy;  Laterality: N/A;  . DILATION AND CURETTAGE OF UTERUS    . ESOPHAGOGASTRODUODENOSCOPY  N/A 03/11/2016   Procedure: ESOPHAGOGASTRODUODENOSCOPY (EGD);  Surgeon: Lollie Sails, MD;  Location: [facility] ENDOSCOPY;  Service: Endoscopy;  Laterality: N/A;  . ESOPHAGOGASTRODUODENOSCOPY (EGD) WITH PROPOFOL N/A 11/11/2017   Procedure: ESOPHAGOGASTRODUODENOSCOPY (EGD) WITH PROPOFOL;  Surgeon: Lollie Sails, MD;  Location: [facility] ENDOSCOPY;  Service: Endoscopy;  Laterality: N/A;  . ESOPHAGOGASTRODUODENOSCOPY (EGD) WITH PROPOFOL N/A 01/20/2018   Procedure: ESOPHAGOGASTRODUODENOSCOPY (EGD) WITH PROPOFOL;  Surgeon: Lollie Sails,  MD;  Location: [facility] ENDOSCOPY;  Service: Endoscopy;  Laterality: N/A;  . FOOT SURGERY Right   . HAMMER TOE SURGERY    . JOINT REPLACEMENT Right 2006   shoulder  . KNEE ARTHROSCOPY Right 05/25/2013   Procedure: ARTHROSCOPY KNEE;  Surgeon: Hessie Dibble, MD;  Location: [place];  Service: Orthopedics;  Laterality: Right;  medial and lateral meniscal debridment and chondroplasty  . ROBOTIC ASSISTED LAPAROSCOPIC REPAIR OF PARAESOPHAGEAL HERNIA  07/16/2018  . SHOULDER ARTHROSCOPY WITH ROTATOR CUFF REPAIR AND SUBACROMIAL DECOMPRESSION Left 08/14/2017   Procedure: SHOULDER ARTHROSCOPY WITH ROTATOR CUFF REPAIR AND SUBACROMIAL DECOMPRESSION;  Surgeon: Tania Ade, MD;  Location: [place];  Service: Orthopedics;  Laterality: Left;  . SHOULDER SURGERY Right    X 3  . TOTAL HIP ARTHROPLASTY Left 11/25/2017   Procedure: LEFT TOTAL HIP ARTHROPLASTY ANTERIOR APPROACH;  Surgeon: Melrose Nakayama, MD;  Location: [place];  Service: Orthopedics;  Laterality: Left;   Family History  Problem Relation Age of Onset  . Heart disease Mother   . Esophageal cancer Father    Social History   Socioeconomic History  . Marital status: Married    Spouse name: Not on file  . Number of children: 3  . Years of education: Not on file  . Highest education level: Associate degree: academic program  Occupational History  . Occupation: retired  Tobacco Use  . Smoking status: Former Smoker    Quit date: 12/29/2001    Years since quitting: 17.2  . Smokeless tobacco: Never Used  Substance and Sexual Activity  . Alcohol use: No  . Drug use: No  . Sexual activity: Not on file  Other Topics Concern  . Not on file  Social History Narrative  . Not on file   Social Determinants of Health   Financial Resource Strain: Low Risk   . Difficulty of Paying Living Expenses: Not hard at all  Food Insecurity: No Food Insecurity  . Worried About Charity fundraiser in the Last Year: Never true  . Ran Out of  Food in the Last Year: Never true  Transportation Needs: No Transportation Needs  . Lack of Transportation (Medical): No  . Lack of Transportation (Non-Medical): No  Physical Activity: Insufficiently Active  . Days of Exercise per Week: 3 days  . Minutes of Exercise per Session: 30 min  Stress: No Stress Concern Present  . Feeling of Stress : Only a little  Social Connections: Unknown  . Frequency of Communication with Friends and Family: Patient refused  . Frequency of Social Gatherings with Friends and Family: Patient refused  . Attends Religious Services: Patient refused  . Active Member of Clubs or Organizations: Patient refused  . Attends Archivist Meetings: Patient refused  . Marital Status: Married    Outpatient Encounter Medications as of 03/10/2019  Medication Sig  . aspirin EC 81 MG tablet Take 81 mg by mouth daily.  . Calcium Carbonate-Vit D-Min (CALCIUM 600+D3 PLUS MINERALS) 600-800 MG-UNIT TABS Take 1 tablet by mouth daily.  . cholecalciferol (VITAMIN D)  1000 units tablet Take 1,000 Units by mouth daily.  Marland Kitchen losartan (COZAAR) 50 MG tablet Take 1 tablet (50 mg total) by mouth daily.  . Magnesium 250 MG TABS Take 250 mg by mouth daily.   . mesalamine (PENTASA) 500 MG CR capsule Take 2,000 mg by mouth 2 (two) times daily.   . Multiple Vitamins-Minerals (MULTIVITAMIN WITH MINERALS) tablet Take 1 tablet by mouth daily.  . naproxen sodium (ALEVE) 220 MG tablet Take by mouth.  . NON FORMULARY BiPap nightly  . omeprazole (PRILOSEC) 40 MG capsule Take 40 mg by mouth at bedtime.   Marland Kitchen oxybutynin (DITROPAN-XL) 10 MG 24 hr tablet Take 1 tablet (10 mg total) by mouth at bedtime.  . Potassium 99 MG TABS Take 99 mg by mouth daily.   . pravastatin (PRAVACHOL) 20 MG tablet Take 1 tablet (20 mg total) by mouth at bedtime.  . vitamin B-12 (CYANOCOBALAMIN) 1000 MCG tablet Take 1,000 mcg by mouth daily.  . [DISCONTINUED] ondansetron (ZOFRAN) 4 MG tablet Take 1 tablet (4 mg total) by  mouth every 8 (eight) hours as needed for nausea or vomiting.   No facility-administered encounter medications on file as of 03/10/2019.    Activities of Daily Living In your present state of health, do you have any difficulty performing the following activities: 03/10/2019 12/11/2018  Hearing? Y N  Comment declines hearing aids -  Vision? N N  Difficulty concentrating or making decisions? N N  Walking or climbing stairs? N N  Dressing or bathing? N N  Doing errands, shopping? N N  Preparing Food and eating ? N -  Using the Toilet? N -  In the past six months, have you accidently leaked urine? Y -  Comment wears pads for protection -  Do you have problems with loss of bowel control? N -  Managing your Medications? N -  Managing your Finances? N -  Housekeeping or managing your Housekeeping? N -  Some recent data might be hidden    Patient Care Team: Glean Hess, MD as PCP - General (Internal Medicine) Corey Skains, MD as Consulting Physician (Cardiology) Erby Pian, MD as Referring Physician (Pulmonary Disease) Gabriel Carina Betsey Holiday, MD as Physician Assistant (Endocrinology) Bertram Gala as Physician Assistant (Gastroenterology)    Assessment:   This is a routine wellness examination for Kimberly Andrade.  Exercise Activities and Dietary recommendations Current Exercise Habits: Home exercise routine, Type of exercise: walking, Time (Minutes): 30, Frequency (Times/Week): 3, Weekly Exercise (Minutes/Week): 90, Intensity: Mild, Exercise limited by: orthopedic condition(s)  Goals    . Weight (lb) < 150 lb (68 kg)     Pt states she would like to lose weight over the next year with diet and exercise       Fall Risk Fall Risk  03/10/2019 12/11/2018 07/24/2018  Falls in the past year? 0 0 0  Number falls in past yr: 0 0 0  Injury with Fall? 0 0 0  Risk for fall due to : Orthopedic patient - -  Follow up Falls prevention discussed Falls evaluation completed -   FALL RISK  PREVENTION PERTAINING TO THE HOME:  Any stairs in or around the home? Yes  If so, do they handrails? Yes   Home free of loose throw rugs in walkways, pet beds, electrical cords, etc? Yes  Adequate lighting in your home to reduce risk of falls? Yes   ASSISTIVE DEVICES UTILIZED TO PREVENT FALLS:  Life alert? No  Use of a  cane, walker or w/c? No  Grab bars in the bathroom? No  Shower chair or bench in shower? Yes  Elevated toilet seat or a handicapped toilet? No   DME ORDERS:  DME order needed?  No   TIMED UP AND GO:  Was the test performed? No . Telephonic visit.  Education: Fall risk prevention has been discussed.  Intervention(s) required? No    Depression Screen PHQ 2/9 Scores 03/10/2019 12/11/2018 07/24/2018  PHQ - 2 Score 0 0 0  PHQ- 9 Score - - 0     Cognitive Function     6CIT Screen 03/10/2019  What Year? 0 points  What month? 0 points  What time? 0 points  Count back from 20 0 points  Months in reverse 0 points  Repeat phrase 0 points  Total Score 0    Immunization History  Administered Date(s) Administered  . Fluad Quad(high Dose 65+) 12/11/2018  . Influenza, High Dose Seasonal PF 11/27/2017  . Influenza, Seasonal, Injecte, Preservative Fre 11/27/2017  . Pneumococcal Conjugate-13 06/29/2015  . Pneumococcal Polysaccharide-23 04/29/2011  . Tdap 07/10/2016  . Zoster 02/01/2014    Qualifies for Shingles Vaccine? Yes  Zostavax completed 2015. Due for Shingrix. Education has been provided regarding the importance of this vaccine. Pt has been advised to call insurance company to determine out of pocket expense. Advised may also receive vaccine at local pharmacy or Health Dept. Verbalized acceptance and understanding.  Tdap: Up to date  Flu Vaccine: Up to date  Pneumococcal Vaccine: Up to date   Screening Tests Health Maintenance  Topic Date Due  . COLONOSCOPY  03/12/2019  . MAMMOGRAM  09/08/2019  . TETANUS/TDAP  07/11/2026  . INFLUENZA VACCINE   Completed  . DEXA SCAN  Completed  . Hepatitis C Screening  Completed  . PNA vac Low Risk Adult  Completed    Cancer Screenings:  Colorectal Screening: Completed 03/11/16. Repeat every 3 years due to Crohn's. Pt waiting on scheduling from [place].  Mammogram: Completed 09/08/18. Repeat every year.   Bone Density: Completed 08/24/18. Results reflect OSTEOPOROSIS. Repeat every 2 years.   Lung Cancer Screening: (Low Dose CT Chest recommended if Age 39-80 years, 30 pack-year currently smoking OR have quit w/in 15years.) does not qualify.   Additional Screening:  Hepatitis C Screening: does qualify; Completed 08/06/16  Vision Screening: Recommended annual ophthalmology exams for early detection of glaucoma and other disorders of the eye. Is the patient up to date with their annual eye exam?  Yes  Who is the provider or what is the name of the office in which the pt attends annual eye exams? [place] Screening: Recommended annual dental exams for proper oral hygiene  Community Resource Referral:  CRR required this visit?  No      Plan:     I have personally reviewed and addressed the Medicare Annual Wellness questionnaire and have noted the following in the patient's chart:  A. Medical and social history B. Use of alcohol, tobacco or illicit drugs  C. Current medications and supplements D. Functional ability and status E.  Nutritional status F.  Physical activity G. Advance directives H. List of other physicians I.  Hospitalizations, surgeries, and ER visits in previous 12 months J.  [place] such as hearing and vision if needed, cognitive and depression L. Referrals and appointments   In addition, I have reviewed and discussed with patient certain preventive protocols, quality metrics, and best practice recommendations. A written  personalized care plan for preventive services as well as general preventive health recommendations were  provided to patient.   Signed,  Clemetine Marker, LPN Nurse Health Advisor   Nurse Notes: pt doing well, scheduled for follow up with Dr. Army Melia on 03/17/19 and having trigger finger surgery on 03/26/19

## 2019-03-10 NOTE — Patient Instructions (Signed)
Kimberly Andrade , Thank you for taking time to come for your Medicare Wellness Visit. I appreciate your ongoing commitment to your health goals. Please review the following plan we discussed and let me know if I can assist you in the future.   Screening recommendations/referrals: Colonoscopy: done 03/11/16. Repeat in 2021. Mammogram: done 09/08/18 Bone Density: done 08/24/18 Recommended yearly ophthalmology/optometry visit for glaucoma screening and checkup Recommended yearly dental visit for hygiene and checkup  Vaccinations: Influenza vaccine: done 12/11/18 Pneumococcal vaccine: done 06/29/15 Tdap vaccine: done 07/10/16 Shingles vaccine: Shingrix discussed. Please contact your pharmacy for coverage information.   Advanced directives: Advance directive discussed with you today. I have provided a copy for you to complete at home and have notarized. Once this is complete please bring a copy in to our office so we can scan it into your chart.  Conditions/risks identified: Recommend increasing physical activity and healthy diet for desired weight loss  Next appointment: Please follow up in one year for your Medicare Annual Wellness visit.     Preventive Care 74 Years and Older, Female Preventive care refers to lifestyle choices and visits with your health care provider that can promote health and wellness. What does preventive care include?  A yearly physical exam. This is also called an annual well check.  Dental exams once or twice a year.  Routine eye exams. Ask your health care provider how often you should have your eyes checked.  Personal lifestyle choices, including:  Daily care of your teeth and gums.  Regular physical activity.  Eating a healthy diet.  Avoiding tobacco and drug use.  Limiting alcohol use.  Practicing safe sex.  Taking low-dose aspirin every day.  Taking vitamin and mineral supplements as recommended by your health care provider. What happens during an  annual well check? The services and screenings done by your health care provider during your annual well check will depend on your age, overall health, lifestyle risk factors, and family history of disease. Counseling  Your health care provider may ask you questions about your:  Alcohol use.  Tobacco use.  Drug use.  Emotional well-being.  Home and relationship well-being.  Sexual activity.  Eating habits.  History of falls.  Memory and ability to understand (cognition).  Work and work Statistician.  Reproductive health. Screening  You may have the following tests or measurements:  Height, weight, and BMI.  Blood pressure.  Lipid and cholesterol levels. These may be checked every 5 years, or more frequently if you are over 64 years old.  Skin check.  Lung cancer screening. You may have this screening every year starting at age 49 if you have a 30-pack-year history of smoking and currently smoke or have quit within the past 15 years.  Fecal occult blood test (FOBT) of the stool. You may have this test every year starting at age 104.  Flexible sigmoidoscopy or colonoscopy. You may have a sigmoidoscopy every 5 years or a colonoscopy every 10 years starting at age 28.  Hepatitis C blood test.  Hepatitis B blood test.  Sexually transmitted disease (STD) testing.  Diabetes screening. This is done by checking your blood sugar (glucose) after you have not eaten for a while (fasting). You may have this done every 1-3 years.  Bone density scan. This is done to screen for osteoporosis. You may have this done starting at age 24.  Mammogram. This may be done every 1-2 years. Talk to your health care provider about how often you should  have regular mammograms. Talk with your health care provider about your test results, treatment options, and if necessary, the need for more tests. Vaccines  Your health care provider may recommend certain vaccines, such as:  Influenza  vaccine. This is recommended every year.  Tetanus, diphtheria, and acellular pertussis (Tdap, Td) vaccine. You may need a Td booster every 10 years.  Zoster vaccine. You may need this after age 1.  Pneumococcal 13-valent conjugate (PCV13) vaccine. One dose is recommended after age 73.  Pneumococcal polysaccharide (PPSV23) vaccine. One dose is recommended after age 56. Talk to your health care provider about which screenings and vaccines you need and how often you need them. This information is not intended to replace advice given to you by your health care provider. Make sure you discuss any questions you have with your health care provider. Document Released: 03/17/2015 Document Revised: 11/08/2015 Document Reviewed: 12/20/2014 Elsevier Interactive Patient Education  2017 [place] Prevention in the Home Falls can cause injuries. They can happen to people of all ages. There are many things you can do to make your home safe and to help prevent falls. What can I do on the outside of my home?  Regularly fix the edges of walkways and driveways and fix any cracks.  Remove anything that might make you trip as you walk through a door, such as a raised step or threshold.  Trim any bushes or trees on the path to your home.  Use bright outdoor lighting.  Clear any walking paths of anything that might make someone trip, such as rocks or tools.  Regularly check to see if handrails are loose or broken. Make sure that both sides of any steps have handrails.  Any raised decks and porches should have guardrails on the edges.  Have any leaves, snow, or ice cleared regularly.  Use sand or salt on walking paths during winter.  Clean up any spills in your garage right away. This includes oil or grease spills. What can I do in the bathroom?  Use night lights.  Install grab bars by the toilet and in the tub and shower. Do not use towel bars as grab bars.  Use non-skid mats or decals  in the tub or shower.  If you need to sit down in the shower, use a plastic, non-slip stool.  Keep the floor dry. Clean up any water that spills on the floor as soon as it happens.  Remove soap buildup in the tub or shower regularly.  Attach bath mats securely with double-sided non-slip rug tape.  Do not have throw rugs and other things on the floor that can make you trip. What can I do in the bedroom?  Use night lights.  Make sure that you have a light by your bed that is easy to reach.  Do not use any sheets or blankets that are too big for your bed. They should not hang down onto the floor.  Have a firm chair that has side arms. You can use this for support while you get dressed.  Do not have throw rugs and other things on the floor that can make you trip. What can I do in the kitchen?  Clean up any spills right away.  Avoid walking on wet floors.  Keep items that you use a lot in easy-to-reach places.  If you need to reach something above you, use a strong step stool that has a grab bar.  Keep electrical cords out of  the way.  Do not use floor polish or wax that makes floors slippery. If you must use wax, use non-skid floor wax.  Do not have throw rugs and other things on the floor that can make you trip. What can I do with my stairs?  Do not leave any items on the stairs.  Make sure that there are handrails on both sides of the stairs and use them. Fix handrails that are broken or loose. Make sure that handrails are as long as the stairways.  Check any carpeting to make sure that it is firmly attached to the stairs. Fix any carpet that is loose or worn.  Avoid having throw rugs at the top or bottom of the stairs. If you do have throw rugs, attach them to the floor with carpet tape.  Make sure that you have a light switch at the top of the stairs and the bottom of the stairs. If you do not have them, ask someone to add them for you. What else can I do to help  prevent falls?  Wear shoes that:  Do not have high heels.  Have rubber bottoms.  Are comfortable and fit you well.  Are closed at the toe. Do not wear sandals.  If you use a stepladder:  Make sure that it is fully opened. Do not climb a closed stepladder.  Make sure that both sides of the stepladder are locked into place.  Ask someone to hold it for you, if possible.  Clearly mark and make sure that you can see:  Any grab bars or handrails.  First and last steps.  Where the edge of each step is.  Use tools that help you move around (mobility aids) if they are needed. These include:  Canes.  Walkers.  Scooters.  Crutches.  Turn on the lights when you go into a dark area. Replace any light bulbs as soon as they burn out.  Set up your furniture so you have a clear path. Avoid moving your furniture around.  If any of your floors are uneven, fix them.  If there are any pets around you, be aware of where they are.  Review your medicines with your doctor. Some medicines can make you feel dizzy. This can increase your chance of falling. Ask your doctor what other things that you can do to help prevent falls. This information is not intended to replace advice given to you by your health care provider. Make sure you discuss any questions you have with your health care provider. Document Released: 12/15/2008 Document Revised: 07/27/2015 Document Reviewed: 03/25/2014 Elsevier Interactive Patient Education  2017 [employer].

## 2019-03-17 ENCOUNTER — Other Ambulatory Visit: Payer: Self-pay

## 2019-03-17 ENCOUNTER — Ambulatory Visit (INDEPENDENT_AMBULATORY_CARE_PROVIDER_SITE_OTHER): Payer: Medicare Other | Admitting: Internal Medicine

## 2019-03-17 ENCOUNTER — Encounter: Payer: Self-pay | Admitting: Internal Medicine

## 2019-03-17 VITALS — BP 122/62 | HR 74 | Ht 62.0 in | Wt 178.0 lb

## 2019-03-17 DIAGNOSIS — I10 Essential (primary) hypertension: Secondary | ICD-10-CM

## 2019-03-17 DIAGNOSIS — Z23 Encounter for immunization: Secondary | ICD-10-CM

## 2019-03-17 MED ORDER — SHINGRIX 50 MCG/0.5ML IM SUSR: 0.5000 mL | Freq: Once | INTRAMUSCULAR | 1 refills | Status: AC

## 2019-03-17 MED ORDER — LOSARTAN POTASSIUM 50 MG PO TABS: 50.0000 mg | ORAL_TABLET | Freq: Every day | ORAL | 3 refills | Status: DC

## 2019-03-17 NOTE — Progress Notes (Signed)
Date:  03/17/2019   Name:  Kimberly Andrade   DOB:  [birth year]   MRN:  [id number]   Chief Complaint: Hypertension (Follow up. Patient brought in her BP cuff with her today and her BP on her own machine was 144/70)  Hypertension This is a new problem. The current episode started more than 1 month ago. The problem has been gradually improving since onset. Condition status: improved - BP at home 138/82. Pertinent negatives include no chest pain, headaches, palpitations or shortness of breath. Past treatments include angiotensin blockers (losartan started last visit).    Lab Results  Component Value Date   CREATININE 0.99 12/11/2018   BUN 16 12/11/2018   NA 146 (H) 12/11/2018   K 4.3 12/11/2018   CL 108 (H) 12/11/2018   CO2 23 12/11/2018   Lab Results  Component Value Date   CHOL 185 12/11/2018   HDL 55 12/11/2018   LDLCALC 103 (H) 12/11/2018   TRIG 155 (H) 12/11/2018   CHOLHDL 3.4 12/11/2018   Lab Results  Component Value Date   TSH 2.250 12/11/2018   Lab Results  Component Value Date   HGBA1C 5.8 (H) 12/11/2018     Review of Systems  Constitutional: Negative for appetite change, fatigue, fever and unexpected weight change.  HENT: Negative for tinnitus.   Respiratory: Negative for cough, chest tightness and shortness of breath.   Cardiovascular: Negative for chest pain, palpitations and leg swelling.  Gastrointestinal: Negative for abdominal pain.  Endocrine: Negative for polydipsia.  Genitourinary: Negative for dysuria and hematuria.  Musculoskeletal: Negative for arthralgias.  Neurological: Negative for tremors, numbness and headaches.  Psychiatric/Behavioral: Negative for dysphoric mood.    Patient Active Problem List   Diagnosis Date Noted  . Atherosclerosis of abdominal aorta ([place]) 03/16/2018  . Primary osteoarthritis of left hip 11/25/2017  . DDD (degenerative disc disease), 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/2017  . Prediabetes 12/25/2016  . Nocturnal leg cramps  12/25/2016  . 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 Urinary incontinence in female 10/16/2016  . 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 Mixed hyperlipidemia 10/05/2015  . GERD (gastroesophageal reflux disease) 04/24/2015  . Crohn's disease of small intestine without complication ([place]) 00/71/2197  . Vitamin B 12 deficiency 06/21/2014  . Obstructive sleep apnea of adult 11/18/2013    Allergies  Allergen Reactions  . Fosamax [Alendronate Sodium] Other (See Comments)    Bones hurt  . Lipitor [Atorvastatin] Other (See Comments)    Bone pain  . Codeine Other (See Comments)     Codeine in the liquid form causes gastritis  . Hydrocodone Nausea And Vomiting    Past Surgical History:  Procedure Laterality Date  . ABDOMINAL HYSTERECTOMY    . APPENDECTOMY    . CARPEL TUNNEL    . CHOLECYSTECTOMY    . COLONOSCOPY WITH PROPOFOL N/A 03/11/2016   Procedure: COLONOSCOPY WITH PROPOFOL;  Surgeon: Lollie Sails, MD;  Location: [facility] ENDOSCOPY;  Service: Endoscopy;  Laterality: N/A;  . DILATION AND CURETTAGE OF UTERUS    . ESOPHAGOGASTRODUODENOSCOPY N/A 03/11/2016   Procedure: ESOPHAGOGASTRODUODENOSCOPY (EGD);  Surgeon: Lollie Sails, MD;  Location: [facility] ENDOSCOPY;  Service: Endoscopy;  Laterality: N/A;  . ESOPHAGOGASTRODUODENOSCOPY (EGD) WITH PROPOFOL N/A 11/11/2017   Procedure: ESOPHAGOGASTRODUODENOSCOPY (EGD) WITH PROPOFOL;  Surgeon: Lollie Sails, MD;  Location: [facility] ENDOSCOPY;  Service: Endoscopy;  Laterality: N/A;  . ESOPHAGOGASTRODUODENOSCOPY (EGD) WITH PROPOFOL N/A 01/20/2018   Procedure: ESOPHAGOGASTRODUODENOSCOPY (EGD) WITH PROPOFOL;  Surgeon: Lollie Sails, MD;  Location: [facility] ENDOSCOPY;  Service: Endoscopy;  Laterality: N/A;  . FOOT SURGERY Right   . HAMMER TOE SURGERY    . JOINT REPLACEMENT Right 2006   shoulder  . KNEE ARTHROSCOPY Right 05/25/2013   Procedure: ARTHROSCOPY KNEE;  Surgeon: Hessie Dibble, MD;  Location: [place];  Service: Orthopedics;   Laterality: Right;  medial and lateral meniscal debridment and chondroplasty  . ROBOTIC ASSISTED LAPAROSCOPIC REPAIR OF PARAESOPHAGEAL HERNIA  07/16/2018  . SHOULDER ARTHROSCOPY WITH ROTATOR CUFF REPAIR AND SUBACROMIAL DECOMPRESSION Left 08/14/2017   Procedure: SHOULDER ARTHROSCOPY WITH ROTATOR CUFF REPAIR AND SUBACROMIAL DECOMPRESSION;  Surgeon: Tania Ade, MD;  Location: [place];  Service: Orthopedics;  Laterality: Left;  . SHOULDER SURGERY Right    X 3  . TOTAL HIP ARTHROPLASTY Left 11/25/2017   Procedure: LEFT TOTAL HIP ARTHROPLASTY ANTERIOR APPROACH;  Surgeon: Melrose Nakayama, MD;  Location: [place];  Service: Orthopedics;  Laterality: Left;    Social History   Tobacco Use  . Smoking status: Former Smoker    Quit date: 12/29/2001    Years since quitting: 17.2  . Smokeless tobacco: Never Used  Substance Use Topics  . Alcohol use: No  . Drug use: No     Medication list has been reviewed and updated.  Current Meds  Medication Sig  . aspirin EC 81 MG tablet Take 81 mg by mouth daily.  . Calcium Carbonate-Vit D-Min (CALCIUM 600+D3 PLUS MINERALS) 600-800 MG-UNIT TABS Take 1 tablet by mouth daily.  . cholecalciferol (VITAMIN D) 1000 units tablet Take 1,000 Units by mouth daily.  Marland Kitchen losartan (COZAAR) 50 MG tablet Take 1 tablet (50 mg total) by mouth daily.  . Magnesium 250 MG TABS Take 250 mg by mouth daily.   . mesalamine (PENTASA) 500 MG CR capsule Take 2,000 mg by mouth 2 (two) times daily.   . Multiple Vitamins-Minerals (MULTIVITAMIN WITH MINERALS) tablet Take 1 tablet by mouth daily.  . naproxen sodium (ALEVE) 220 MG tablet Take by mouth.  . NON FORMULARY BiPap nightly  . omeprazole (PRILOSEC) 40 MG capsule Take 40 mg by mouth at bedtime.   Marland Kitchen oxybutynin (DITROPAN-XL) 10 MG 24 hr tablet Take 1 tablet (10 mg total) by mouth at bedtime.  . Potassium 99 MG TABS Take 99 mg by mouth daily.   . pravastatin (PRAVACHOL) 20 MG tablet Take 1 tablet (20 mg total) by mouth at bedtime.  .  vitamin B-12 (CYANOCOBALAMIN) 1000 MCG tablet Take 1,000 mcg by mouth daily.    PHQ 2/9 Scores 03/17/2019 03/10/2019 12/11/2018 07/24/2018  PHQ - 2 Score 0 0 0 0  PHQ- 9 Score - - - 0    BP Readings from Last 3 Encounters:  03/17/19 122/62  03/10/19 138/87  12/11/18 138/78    Physical Exam Vitals and nursing note reviewed.  Constitutional:      General: She is not in acute distress.    Appearance: She is well-developed.  HENT:     Head: Normocephalic and atraumatic.  Cardiovascular:     Rate and Rhythm: Normal rate and regular rhythm.     Pulses: Normal pulses.  Pulmonary:     Effort: Pulmonary effort is normal. No respiratory distress.     Breath sounds: No wheezing or rhonchi.  Musculoskeletal:     Cervical back: Normal range of motion and neck supple.     Right lower leg: No edema.     Left lower leg: No edema.  Skin:    General: Skin is warm and dry.  Capillary Refill: Capillary refill takes less than 2 seconds.     Findings: No rash.  Neurological:     Mental Status: She is alert and oriented to person, place, and time.  Psychiatric:        Behavior: Behavior normal.        Thought Content: Thought content normal.     Wt Readings from Last 3 Encounters:  03/17/19 178 lb (80.7 kg)  03/10/19 165 lb (74.8 kg)  12/11/18 172 lb (78 kg)    BP 122/62   Pulse 74   Ht 5' 2"  (1.575 m)   Wt 178 lb (80.7 kg)   SpO2 94%   BMI 32.56 kg/m   Assessment and Plan: 1. Essential hypertension BP 130/70 on Left with manual cuff;   BP 132/70 on right with automatic cuff Clinically stable exam with well controlled BP on losartan 50 mg.. Tolerating medications without side effects at this time. Pt to continue current regimen and low sodium diet; benefits of regular exercise as able discussed. - losartan (COZAAR) 50 MG tablet; Take 1 tablet (50 mg total) by mouth daily.  Dispense: 90 tablet; Refill: 3  2. Need for shingles vaccine Patient will get this at her local  pharmacy. - Zoster Vaccine Adjuvanted [facility]) injection; Inject 0.5 mLs into the muscle once for 1 dose.  Dispense: 0.5 mL; Refill: 1   Partially dictated using Editor, commissioning. Any errors are unintentional.  Halina Maidens, MD [facility]  03/17/2019

## 2019-06-07 ENCOUNTER — Encounter: Payer: Self-pay | Admitting: Internal Medicine

## 2019-06-08 ENCOUNTER — Other Ambulatory Visit: Payer: Self-pay | Admitting: Internal Medicine

## 2019-06-08 DIAGNOSIS — R32 Unspecified urinary incontinence: Secondary | ICD-10-CM

## 2019-06-08 MED ORDER — OXYBUTYNIN CHLORIDE ER 10 MG PO TB24: 10.0000 mg | ORAL_TABLET | Freq: Every day | ORAL | 1 refills | Status: DC

## 2019-06-15 LAB — HM COLONOSCOPY

## 2019-06-24 ENCOUNTER — Encounter: Payer: Self-pay | Admitting: Internal Medicine

## 2019-06-24 ENCOUNTER — Other Ambulatory Visit: Payer: Self-pay | Admitting: Internal Medicine

## 2019-06-24 DIAGNOSIS — E782 Mixed hyperlipidemia: Secondary | ICD-10-CM

## 2019-06-24 MED ORDER — PRAVASTATIN SODIUM 20 MG PO TABS: 20.0000 mg | ORAL_TABLET | Freq: Every day | ORAL | 1 refills | Status: DC

## 2019-07-14 ENCOUNTER — Other Ambulatory Visit: Payer: Self-pay

## 2019-07-14 ENCOUNTER — Ambulatory Visit (INDEPENDENT_AMBULATORY_CARE_PROVIDER_SITE_OTHER): Payer: Medicare Other | Admitting: Internal Medicine

## 2019-07-14 ENCOUNTER — Encounter: Payer: Self-pay | Admitting: Internal Medicine

## 2019-07-14 VITALS — BP 124/70 | HR 65 | Ht 62.0 in | Wt 175.0 lb

## 2019-07-14 DIAGNOSIS — M79672 Pain in left foot: Secondary | ICD-10-CM

## 2019-07-14 DIAGNOSIS — M79671 Pain in right foot: Secondary | ICD-10-CM | POA: Diagnosis not present

## 2019-07-14 DIAGNOSIS — E538 Deficiency of other specified B group vitamins: Secondary | ICD-10-CM | POA: Diagnosis not present

## 2019-07-14 NOTE — Progress Notes (Signed)
Date:  07/14/2019   Name:  Kimberly Andrade   DOB:  11/09/1945   MRN:  [id number]   Chief Complaint: Foot Pain (Bilateral foot pain since Fall 2020. Kimberly Andrade it gets so bad it hurts when the bed or blankets touched her feet. Ortho sent her to podiatry at ortho. Was put on gabapentin but it only helps sometimes. Foot pain affects balance, walking, and sleeping. ) and Elbow Injury (R) elbow- Dosed off on Friday and when she woke up her right arm was in pain. Black and blue bruise was going up her arm on thwe inside of her elbow. Now healing but yellow bruise. )  Foot Pain This is a chronic problem. Episode onset: since last fall - 8 months. The problem occurs daily. The problem has been unchanged. Associated symptoms include arthralgias (right knee). Pertinent negatives include no chest pain, chills, coughing, fever or joint swelling. Treatments tried: gabapentin.  She describes a sensation of her toes being disconnected from her feet, along with pain intermittently.  The pain worse at night - anything touching her feet is uncomfortable. She started on gabapentin 300 mg bid without about 40% improvement.  She has been hesitant to increase it further.  Lab Results  Component Value Date   CREATININE 0.99 12/11/2018   BUN 16 12/11/2018   NA 146 (H) 12/11/2018   K 4.3 12/11/2018   CL 108 (H) 12/11/2018   CO2 23 12/11/2018   Lab Results  Component Value Date   CHOL 185 12/11/2018   HDL 55 12/11/2018   LDLCALC 103 (H) 12/11/2018   TRIG 155 (H) 12/11/2018   CHOLHDL 3.4 12/11/2018   Lab Results  Component Value Date   TSH 2.250 12/11/2018   Lab Results  Component Value Date   HGBA1C 5.8 (H) 12/11/2018   Lab Results  Component Value Date   WBC 6.8 12/11/2018   HGB 13.4 12/11/2018   HCT 40.4 12/11/2018   MCV 89 12/11/2018   PLT 284 12/11/2018   Lab Results  Component Value Date   ALT 15 12/11/2018   AST 18 12/11/2018   ALKPHOS 38 (L) 12/11/2018   BILITOT 0.4 12/11/2018      Review of Systems  Constitutional: Negative for chills and fever.  Respiratory: Negative for cough, chest tightness and shortness of breath.   Cardiovascular: Negative for chest pain and leg swelling (mild intermittent swelling).  Musculoskeletal: Positive for arthralgias (right knee) and gait problem (hard to walk with the foot pain). Negative for joint swelling.  Neurological: Negative for dizziness and light-headedness.    Patient Active Problem List   Diagnosis Date Noted  . Atherosclerosis of abdominal aorta ([place]) 03/16/2018  . Primary osteoarthritis of left hip 11/25/2017  . DDD (degenerative disc disease), 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/2017  . Prediabetes 12/25/2016  . Nocturnal leg cramps 12/25/2016  . 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 Urinary incontinence in female 10/16/2016  . 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 Mixed hyperlipidemia 10/05/2015  . GERD (gastroesophageal reflux disease) 04/24/2015  . Crohn's disease of small intestine without complication ([place]) 26/37/8588  . Vitamin B 12 deficiency 06/21/2014  . Obstructive sleep apnea of adult 11/18/2013    Allergies  Allergen Reactions  . Fosamax [Alendronate Sodium] Other (See Comments)    Bones hurt  . Lipitor [Atorvastatin] Other (See Comments)    Bone pain  . Codeine Other (See Comments)     Codeine in the liquid form causes gastritis  . Hydrocodone Nausea And Vomiting    Past  Surgical History:  Procedure Laterality Date  . ABDOMINAL HYSTERECTOMY    . APPENDECTOMY    . CARPEL TUNNEL    . CHOLECYSTECTOMY    . COLONOSCOPY WITH PROPOFOL N/A 03/11/2016   Procedure: COLONOSCOPY WITH PROPOFOL;  Surgeon: Lollie Sails, MD;  Location: [facility] ENDOSCOPY;  Service: Endoscopy;  Laterality: N/A;  . DILATION AND CURETTAGE OF UTERUS    . ESOPHAGOGASTRODUODENOSCOPY N/A 03/11/2016   Procedure: ESOPHAGOGASTRODUODENOSCOPY (EGD);  Surgeon: Lollie Sails, MD;  Location: [facility] ENDOSCOPY;  Service: Endoscopy;  Laterality: N/A;  .  ESOPHAGOGASTRODUODENOSCOPY (EGD) WITH PROPOFOL N/A 11/11/2017   Procedure: ESOPHAGOGASTRODUODENOSCOPY (EGD) WITH PROPOFOL;  Surgeon: Lollie Sails, MD;  Location: [facility] ENDOSCOPY;  Service: Endoscopy;  Laterality: N/A;  . ESOPHAGOGASTRODUODENOSCOPY (EGD) WITH PROPOFOL N/A 01/20/2018   Procedure: ESOPHAGOGASTRODUODENOSCOPY (EGD) WITH PROPOFOL;  Surgeon: Lollie Sails, MD;  Location: [facility] ENDOSCOPY;  Service: Endoscopy;  Laterality: N/A;  . FOOT SURGERY Right   . HAMMER TOE SURGERY    . JOINT REPLACEMENT Right 2006   shoulder  . KNEE ARTHROSCOPY Right 05/25/2013   Procedure: ARTHROSCOPY KNEE;  Surgeon: Hessie Dibble, MD;  Location: [place];  Service: Orthopedics;  Laterality: Right;  medial and lateral meniscal debridment and chondroplasty  . ROBOTIC ASSISTED LAPAROSCOPIC REPAIR OF PARAESOPHAGEAL HERNIA  07/16/2018  . SHOULDER ARTHROSCOPY WITH ROTATOR CUFF REPAIR AND SUBACROMIAL DECOMPRESSION Left 08/14/2017   Procedure: SHOULDER ARTHROSCOPY WITH ROTATOR CUFF REPAIR AND SUBACROMIAL DECOMPRESSION;  Surgeon: Tania Ade, MD;  Location: [place];  Service: Orthopedics;  Laterality: Left;  . SHOULDER SURGERY Right    X 3  . TOTAL HIP ARTHROPLASTY Left 11/25/2017   Procedure: LEFT TOTAL HIP ARTHROPLASTY ANTERIOR APPROACH;  Surgeon: Melrose Nakayama, MD;  Location: [place];  Service: Orthopedics;  Laterality: Left;    Social History   Tobacco Use  . Smoking status: Former Smoker    Quit date: 12/29/2001    Years since quitting: 17.5  . Smokeless tobacco: Never Used  Substance Use Topics  . Alcohol use: No  . Drug use: No     Medication list has been reviewed and updated.  Current Meds  Medication Sig  . aspirin EC 81 MG tablet Take 81 mg by mouth daily.  . Calcium Carbonate-Vit D-Min (CALCIUM 600+D3 PLUS MINERALS) 600-800 MG-UNIT TABS Take 1 tablet by mouth daily.  . cholecalciferol (VITAMIN D) 1000 units tablet Take 1,000 Units by mouth daily.  Marland Kitchen gabapentin  (NEURONTIN) 300 MG capsule Take 1 capsule by mouth 2 (two) times daily.   Marland Kitchen losartan (COZAAR) 50 MG tablet Take 1 tablet (50 mg total) by mouth daily.  . Magnesium 250 MG TABS Take 250 mg by mouth daily.   . mesalamine (PENTASA) 500 MG CR capsule Take 2,000 mg by mouth 2 (two) times daily.   . Multiple Vitamins-Minerals (MULTIVITAMIN WITH MINERALS) tablet Take 1 tablet by mouth daily.  . naproxen sodium (ALEVE) 220 MG tablet Take by mouth.  . NON FORMULARY BiPap nightly  . oxybutynin (DITROPAN-XL) 10 MG 24 hr tablet Take 1 tablet (10 mg total) by mouth at bedtime.  . Potassium 99 MG TABS Take 99 mg by mouth daily.   . pravastatin (PRAVACHOL) 20 MG tablet Take 1 tablet (20 mg total) by mouth at bedtime. (Patient taking differently: Take 40 mg by mouth at bedtime. )  . PROLIA 60 MG/ML SOSY injection every 6 (six) months.  . vitamin B-12 (CYANOCOBALAMIN) 1000 MCG tablet Take 1,000 mcg by mouth daily.  PHQ 2/9 Scores 07/14/2019 03/17/2019 03/10/2019 12/11/2018  PHQ - 2 Score 2 0 0 0  PHQ- 9 Score 6 - - -    BP Readings from Last 3 Encounters:  07/14/19 124/70  03/17/19 122/62  03/10/19 138/87    Physical Exam Vitals and nursing note reviewed.  Constitutional:      General: She is not in acute distress.    Appearance: She is well-developed.  HENT:     Head: Normocephalic and atraumatic.  Cardiovascular:     Rate and Rhythm: Normal rate and regular rhythm.     Pulses:          Dorsalis pedis pulses are 1+ on the right side and 1+ on the left side.       Posterior tibial pulses are 2+ on the right side and 2+ on the left side.  Pulmonary:     Effort: Pulmonary effort is normal. No respiratory distress.     Breath sounds: No wheezing or rhonchi.  Musculoskeletal:        General: Normal range of motion.     Right foot: Normal range of motion. No deformity or prominent metatarsal heads.     Left foot: Normal range of motion. No deformity or prominent metatarsal heads.  Feet:     Right  foot:     Skin integrity: Skin integrity normal.     Left foot:     Skin integrity: Skin integrity normal.  Skin:    General: Skin is warm and dry.     Findings: No rash.  Neurological:     Mental Status: She is alert and oriented to person, place, and time.  Psychiatric:        Behavior: Behavior normal.        Thought Content: Thought content normal.     Wt Readings from Last 3 Encounters:  07/14/19 175 lb (79.4 kg)  03/17/19 178 lb (80.7 kg)  03/10/19 165 lb (74.8 kg)    BP 124/70   Pulse 65   Ht 5' 2"  (1.575 m)   Wt 175 lb (79.4 kg)   SpO2 95%   BMI 32.01 kg/m   Assessment and Plan: 1. Foot pain, bilateral Sound like neuropathy; increase gabapentin up to 4 capsules per day May need to see neurology but will confirm that her B12 is in the normal range  2. Vitamin B 12 deficiency - Vitamin B12   Partially dictated using Editor, commissioning. Any errors are unintentional.  Halina Maidens, MD [facility]  07/14/2019

## 2019-07-14 NOTE — Patient Instructions (Signed)
Increase gabapentin to one capsule in the morning and 2 capsules in the evening (about 2 hours before bed). After a week, increase to 3 capsules at bedtime

## 2019-07-15 LAB — VITAMIN B12: Vitamin B-12: 1073 pg/mL (ref 232–1245)

## 2019-07-20 ENCOUNTER — Encounter: Payer: Self-pay | Admitting: Internal Medicine

## 2019-07-20 ENCOUNTER — Other Ambulatory Visit: Payer: Self-pay | Admitting: Internal Medicine

## 2019-07-20 DIAGNOSIS — M79671 Pain in right foot: Secondary | ICD-10-CM

## 2019-07-20 MED ORDER — GABAPENTIN 300 MG PO CAPS: 600.0000 mg | ORAL_CAPSULE | Freq: Two times a day (BID) | ORAL | 1 refills | Status: DC

## 2019-07-26 ENCOUNTER — Other Ambulatory Visit: Payer: Self-pay | Admitting: Internal Medicine

## 2019-07-26 DIAGNOSIS — M79672 Pain in left foot: Secondary | ICD-10-CM

## 2019-07-26 MED ORDER — GABAPENTIN 300 MG PO CAPS: 600.0000 mg | ORAL_CAPSULE | Freq: Two times a day (BID) | ORAL | 0 refills | Status: DC

## 2019-07-26 NOTE — Telephone Encounter (Signed)
Patient requesting a short supply of gabapentin (NEURONTIN) 300 MG capsule please send to retail pharmacy until mail order is received. Patient would like a follow up call when completed. Patient states as soon as possible.    [place], [place] - [id number] [place] [address] [place] Phone:  [phone]  Fax:  [phone]

## 2019-07-26 NOTE — Telephone Encounter (Signed)
!   Week supply sent to local pharamcy

## 2019-07-27 ENCOUNTER — Other Ambulatory Visit: Payer: Self-pay

## 2019-07-27 ENCOUNTER — Encounter: Payer: Self-pay | Admitting: Internal Medicine

## 2019-07-27 ENCOUNTER — Ambulatory Visit (INDEPENDENT_AMBULATORY_CARE_PROVIDER_SITE_OTHER): Payer: Medicare Other | Admitting: Internal Medicine

## 2019-07-27 VITALS — BP 112/64 | HR 82 | Temp 98.4°F | Ht 62.0 in | Wt 176.0 lb

## 2019-07-27 DIAGNOSIS — M79672 Pain in left foot: Secondary | ICD-10-CM | POA: Diagnosis not present

## 2019-07-27 DIAGNOSIS — M79671 Pain in right foot: Secondary | ICD-10-CM

## 2019-07-27 DIAGNOSIS — G5760 Lesion of plantar nerve, unspecified lower limb: Secondary | ICD-10-CM | POA: Insufficient documentation

## 2019-07-27 DIAGNOSIS — R600 Localized edema: Secondary | ICD-10-CM | POA: Diagnosis not present

## 2019-07-27 LAB — VITAMIN D 25 HYDROXY (VIT D DEFICIENCY, FRACTURES): Vit D, 25-Hydroxy: 35.7

## 2019-07-27 NOTE — Progress Notes (Signed)
Date:  07/27/2019   Name:  Kimberly Andrade   DOB:  1945-06-05   MRN:  [id number]   Chief Complaint: Edema (Left foot and ankle swelling. Started on tuesday. Orthopedic doctor told her to have it looked at by PCP. Never happened before, Most of the swelling is gone down now. )  Edema - she developed edema of the left foot and ankle after working in the yard last week.  It was not painful, red or hot to touch.  Ortho saw it and recommended follow up here.  Since then the edema is much better with elevation and ice. Of note, she had just increased the gabapentin.  However, she ran out 5 days ago and is waiting for the mail order Rx. She denies chest pain, SOB, or PND.  She sleeps comfortably on one pillow.  Lab Results  Component Value Date   CREATININE 0.99 12/11/2018   BUN 16 12/11/2018   NA 146 (H) 12/11/2018   K 4.3 12/11/2018   CL 108 (H) 12/11/2018   CO2 23 12/11/2018   Lab Results  Component Value Date   CHOL 185 12/11/2018   HDL 55 12/11/2018   LDLCALC 103 (H) 12/11/2018   TRIG 155 (H) 12/11/2018   CHOLHDL 3.4 12/11/2018   Lab Results  Component Value Date   TSH 2.250 12/11/2018   Lab Results  Component Value Date   HGBA1C 5.8 (H) 12/11/2018   Lab Results  Component Value Date   WBC 6.8 12/11/2018   HGB 13.4 12/11/2018   HCT 40.4 12/11/2018   MCV 89 12/11/2018   PLT 284 12/11/2018   Lab Results  Component Value Date   ALT 15 12/11/2018   AST 18 12/11/2018   ALKPHOS 38 (L) 12/11/2018   BILITOT 0.4 12/11/2018     Review of Systems  Constitutional: Negative for chills, fatigue and fever.  Respiratory: Negative for cough, chest tightness and shortness of breath.   Cardiovascular: Positive for leg swelling. Negative for chest pain.  Neurological: Negative for dizziness, light-headedness and headaches.    Patient Active Problem List   Diagnosis Date Noted  . Atherosclerosis of abdominal aorta ([place]) 03/16/2018  . Primary osteoarthritis of left hip  11/25/2017  . DDD (degenerative disc disease), 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/2017  . Prediabetes 12/25/2016  . Nocturnal leg cramps 12/25/2016  . 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 Urinary incontinence in female 10/16/2016  . 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 Mixed hyperlipidemia 10/05/2015  . GERD (gastroesophageal reflux disease) 04/24/2015  . Crohn's disease of small intestine without complication ([place]) 35/57/3220  . Vitamin B 12 deficiency 06/21/2014  . Obstructive sleep apnea of adult 11/18/2013    Allergies  Allergen Reactions  . Fosamax [Alendronate Sodium] Other (See Comments)    Bones hurt  . Lipitor [Atorvastatin] Other (See Comments)    Bone pain  . Codeine Other (See Comments)     Codeine in the liquid form causes gastritis  . Hydrocodone Nausea And Vomiting    Past Surgical History:  Procedure Laterality Date  . ABDOMINAL HYSTERECTOMY    . APPENDECTOMY    . CARPEL TUNNEL    . CHOLECYSTECTOMY    . COLONOSCOPY WITH PROPOFOL N/A 03/11/2016   Procedure: COLONOSCOPY WITH PROPOFOL;  Surgeon: Lollie Sails, MD;  Location: [facility] ENDOSCOPY;  Service: Endoscopy;  Laterality: N/A;  . DILATION AND CURETTAGE OF UTERUS    . ESOPHAGOGASTRODUODENOSCOPY N/A 03/11/2016   Procedure: ESOPHAGOGASTRODUODENOSCOPY (EGD);  Surgeon: Lollie Sails, MD;  Location: [facility]  ENDOSCOPY;  Service: Endoscopy;  Laterality: N/A;  . ESOPHAGOGASTRODUODENOSCOPY (EGD) WITH PROPOFOL N/A 11/11/2017   Procedure: ESOPHAGOGASTRODUODENOSCOPY (EGD) WITH PROPOFOL;  Surgeon: Lollie Sails, MD;  Location: [facility] ENDOSCOPY;  Service: Endoscopy;  Laterality: N/A;  . ESOPHAGOGASTRODUODENOSCOPY (EGD) WITH PROPOFOL N/A 01/20/2018   Procedure: ESOPHAGOGASTRODUODENOSCOPY (EGD) WITH PROPOFOL;  Surgeon: Lollie Sails, MD;  Location: [facility] ENDOSCOPY;  Service: Endoscopy;  Laterality: N/A;  . FOOT SURGERY Right   . HAMMER TOE SURGERY    . JOINT REPLACEMENT Right 2006   shoulder  . KNEE ARTHROSCOPY Right 05/25/2013    Procedure: ARTHROSCOPY KNEE;  Surgeon: Hessie Dibble, MD;  Location: [place];  Service: Orthopedics;  Laterality: Right;  medial and lateral meniscal debridment and chondroplasty  . ROBOTIC ASSISTED LAPAROSCOPIC REPAIR OF PARAESOPHAGEAL HERNIA  07/16/2018  . SHOULDER ARTHROSCOPY WITH ROTATOR CUFF REPAIR AND SUBACROMIAL DECOMPRESSION Left 08/14/2017   Procedure: SHOULDER ARTHROSCOPY WITH ROTATOR CUFF REPAIR AND SUBACROMIAL DECOMPRESSION;  Surgeon: Tania Ade, MD;  Location: [place];  Service: Orthopedics;  Laterality: Left;  . SHOULDER SURGERY Right    X 3  . TOTAL HIP ARTHROPLASTY Left 11/25/2017   Procedure: LEFT TOTAL HIP ARTHROPLASTY ANTERIOR APPROACH;  Surgeon: Melrose Nakayama, MD;  Location: [place];  Service: Orthopedics;  Laterality: Left;    Social History   Tobacco Use  . Smoking status: Former Smoker    Quit date: 12/29/2001    Years since quitting: 17.5  . Smokeless tobacco: Never Used  Substance Use Topics  . Alcohol use: No  . Drug use: No     Medication list has been reviewed and updated.  Current Meds  Medication Sig  . aspirin EC 81 MG tablet Take 81 mg by mouth daily.  . Calcium Carbonate-Vit D-Min (CALCIUM 600+D3 PLUS MINERALS) 600-800 MG-UNIT TABS Take 1 tablet by mouth daily.  . cholecalciferol (VITAMIN D) 1000 units tablet Take 1,000 Units by mouth daily.  Marland Kitchen gabapentin (NEURONTIN) 300 MG capsule Take 2 capsules (600 mg total) by mouth 2 (two) times daily.  . GELSYN-3 16.8 MG/2ML SOSY inject 1 prefilled syringe into right knee  . losartan (COZAAR) 50 MG tablet Take 1 tablet (50 mg total) by mouth daily.  . Magnesium 250 MG TABS Take 250 mg by mouth daily.   . mesalamine (PENTASA) 500 MG CR capsule Take 2,000 mg by mouth 2 (two) times daily.   . Multiple Vitamins-Minerals (MULTIVITAMIN WITH MINERALS) tablet Take 1 tablet by mouth daily.  . naproxen sodium (ALEVE) 220 MG tablet Take by mouth.  . NON FORMULARY BiPap nightly  . oxybutynin  (DITROPAN-XL) 10 MG 24 hr tablet Take 1 tablet (10 mg total) by mouth at bedtime.  . Potassium 99 MG TABS Take 99 mg by mouth daily.   . pravastatin (PRAVACHOL) 20 MG tablet Take 1 tablet (20 mg total) by mouth at bedtime. (Patient taking differently: Take 40 mg by mouth at bedtime. )  . PROLIA 60 MG/ML SOSY injection every 6 (six) months.  . vitamin B-12 (CYANOCOBALAMIN) 1000 MCG tablet Take 1,000 mcg by mouth daily.    PHQ 2/9 Scores 07/27/2019 07/14/2019 03/17/2019 03/10/2019  PHQ - 2 Score 0 2 0 0  PHQ- 9 Score 0 6 - -    BP Readings from Last 3 Encounters:  07/27/19 112/64  07/14/19 124/70  03/17/19 122/62    Physical Exam Constitutional:      Appearance: Normal appearance.  Cardiovascular:     Rate and Rhythm: Normal rate and regular rhythm.  Pulmonary:     Effort: Pulmonary effort is normal.     Breath sounds: Normal breath sounds.  Musculoskeletal:        General: No tenderness or deformity.     Right lower leg: Edema (1+ pitting) present.     Left lower leg: Edema (1+ pitting) present.     Comments: No calf tenderness, cord, redness or warmth Both legs are equal; both have mild edema at lower tibia  Skin:    Findings: No erythema.  Neurological:     Mental Status: She is alert.  Psychiatric:        Attention and Perception: Attention normal.        Mood and Affect: Mood normal.     Wt Readings from Last 3 Encounters:  07/27/19 176 lb (79.8 kg)  07/14/19 175 lb (79.4 kg)  03/17/19 178 lb (80.7 kg)    BP 112/64   Pulse 82   Temp 98.4 F (36.9 C) (Oral)   Ht 5' 2"  (1.575 m)   Wt 176 lb (79.8 kg)   SpO2 96%   BMI 32.19 kg/m   Assessment and Plan: 1. Mild peripheral edema Due to heat, standing and mild venous insufficiency No evidence of DVT or cardiac contributions Recommend elevation as needed  2. Bilateral foot pain Patient has had some relief with 600 mg bid gabapentin Continue higher dose gabapentin Monitor for edema as a possible side  effect   Partially dictated using Editor, commissioning. Any errors are unintentional.  Halina Maidens, MD [facility]  07/27/2019

## 2019-08-09 ENCOUNTER — Other Ambulatory Visit: Payer: Self-pay | Admitting: Internal Medicine

## 2019-08-09 DIAGNOSIS — Z1231 Encounter for screening mammogram for malignant neoplasm of breast: Secondary | ICD-10-CM

## 2019-08-10 ENCOUNTER — Encounter: Payer: Self-pay | Admitting: Podiatry

## 2019-08-10 ENCOUNTER — Other Ambulatory Visit: Payer: Self-pay

## 2019-08-10 ENCOUNTER — Ambulatory Visit (INDEPENDENT_AMBULATORY_CARE_PROVIDER_SITE_OTHER): Payer: Medicare Other | Admitting: Podiatry

## 2019-08-10 DIAGNOSIS — G5763 Lesion of plantar nerve, bilateral lower limbs: Secondary | ICD-10-CM

## 2019-08-10 MED ORDER — MELOXICAM 15 MG PO TABS: 15.0000 mg | ORAL_TABLET | Freq: Every day | ORAL | 1 refills | Status: DC

## 2019-08-10 MED ORDER — METHYLPREDNISOLONE 4 MG PO TBPK: ORAL_TABLET | ORAL | 0 refills | Status: DC

## 2019-08-10 NOTE — Progress Notes (Signed)
   HPI: 74 y.o. female presenting today as a new patient for evaluation of bilateral foot pain that suddenly began around September 2020.  Patient states that she started to notice a severe amount of pain to the bilateral forefoot.  Patient does not recall injury or change in shoe gear.  She describes the pain as a numbness shooting pain isolated to the bilateral forefoot.  She is currently taking 2100 mg of gabapentin daily to alleviate her symptoms.  She was diagnosed approximately 8 months ago as having neuropathy.  She presents for further treatment and evaluation  Past Medical History:  Diagnosis Date  . Anemia   . Arthritis   . B12 deficiency   . Crohn disease ([place])   . Crohn's disease ([place])   . Depression   . Depression, major, single episode, complete remission ([place]) 10/28/2017  . Diverticulosis   . DJD (degenerative joint disease)   . DJD (degenerative joint disease)   . Dyspnea    with exertion (incline walking)  . Full dentures   . GERD (gastroesophageal reflux disease)   . Heart palpitations   . Hiatal hernia   . History of hiatal hernia   . Hypercholesterolemia   . Hyperlipidemia   . Incontinence of urine   . Medical history non-contributory   . Nocturnal leg cramps   . Osteopenia   . Osteoporosis   . Pre-diabetes   . Reflux   . Sleep apnea    uses a cpap  . Wears glasses      Physical Exam: General: The patient is alert and oriented x3 in no acute distress.  Dermatology: Skin is warm, dry and supple bilateral lower extremities. Negative for open lesions or macerations.  Vascular: Palpable pedal pulses bilaterally. No edema or erythema noted. Capillary refill within normal limits.  Neurological: Epicritic and protective threshold grossly intact bilaterally.   Musculoskeletal Exam: Range of motion within normal limits to all pedal and ankle joints bilateral. Muscle strength 5/5 in all groups bilateral.  Significant pain on palpation to the second and third  interspaces of the bilateral feet consistent with a Morton's neuroma.  Pain with lateral compression of the metatarsal heads also noted.  Assessment: 1.  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 Patient evaluated.  2.  Injection of 0.5 cc Celestone Soluspan injected into the second and third interspaces of the bilateral feet 3.  Prescription for Medrol Dosepak 4.  Prescription for meloxicam 15 mg daily 5.  Recommend wide fitting shoes that do not constrict the forefoot 6.  Return to clinic in 3 weeks      Edrick Kins, DPM [facility]  Dr. Edrick Kins, DPM    [address], [place] [address]                Office [phone]  Fax [phone]

## 2019-08-16 ENCOUNTER — Other Ambulatory Visit: Payer: Self-pay

## 2019-08-16 ENCOUNTER — Encounter: Payer: Self-pay | Admitting: Internal Medicine

## 2019-08-16 ENCOUNTER — Ambulatory Visit (INDEPENDENT_AMBULATORY_CARE_PROVIDER_SITE_OTHER): Payer: Medicare Other | Admitting: Internal Medicine

## 2019-08-16 VITALS — BP 122/72 | HR 67 | Temp 98.1°F | Ht 62.0 in | Wt 175.0 lb

## 2019-08-16 DIAGNOSIS — I10 Essential (primary) hypertension: Secondary | ICD-10-CM | POA: Diagnosis not present

## 2019-08-16 DIAGNOSIS — K5000 Crohn's disease of small intestine without complications: Secondary | ICD-10-CM | POA: Diagnosis not present

## 2019-08-16 DIAGNOSIS — G5763 Lesion of plantar nerve, bilateral lower limbs: Secondary | ICD-10-CM

## 2019-08-16 DIAGNOSIS — R32 Unspecified urinary incontinence: Secondary | ICD-10-CM

## 2019-08-16 DIAGNOSIS — I700 Atherosclerosis of aorta: Secondary | ICD-10-CM | POA: Diagnosis not present

## 2019-08-16 HISTORY — DX: Essential (primary) hypertension: I10

## 2019-08-16 MED ORDER — OXYBUTYNIN CHLORIDE ER 15 MG PO TB24: 15.0000 mg | ORAL_TABLET | Freq: Every day | ORAL | 0 refills | Status: DC

## 2019-08-16 NOTE — Progress Notes (Signed)
Date:  08/16/2019   Name:  Kimberly Andrade   DOB:  [birth year]   MRN:  [id number]   Chief Complaint: Hypertension (Follow up.) and Gastroesophageal Reflux  Hypertension This is a new problem. The current episode started more than 1 month ago. The problem has been gradually improving since onset. The problem is controlled. Pertinent negatives include no chest pain, headaches, palpitations or shortness of breath. Past treatments include angiotensin blockers. The current treatment provides significant improvement.  Crohns disease of colon - followed by [facility].  Had EGD and colonoscopy in 06/2019.  EGD was normal, as was the colonoscopy.  Morton's neuroma - seen by Podiatry but not much benefit from the steroid injections. Felt to be in both feet.  Taking gabapentin three times a day - 600 mg.  When her foot pain is severe, she takes an extra gabapentin in the evening with relief.   Lab Results  Component Value Date   CREATININE 0.99 12/11/2018   BUN 16 12/11/2018   NA 146 (H) 12/11/2018   K 4.3 12/11/2018   CL 108 (H) 12/11/2018   CO2 23 12/11/2018   Lab Results  Component Value Date   CHOL 185 12/11/2018   HDL 55 12/11/2018   LDLCALC 103 (H) 12/11/2018   TRIG 155 (H) 12/11/2018   CHOLHDL 3.4 12/11/2018   Lab Results  Component Value Date   TSH 2.250 12/11/2018   Lab Results  Component Value Date   HGBA1C 5.8 (H) 12/11/2018   Lab Results  Component Value Date   WBC 6.8 12/11/2018   HGB 13.4 12/11/2018   HCT 40.4 12/11/2018   MCV 89 12/11/2018   PLT 284 12/11/2018   Lab Results  Component Value Date   ALT 15 12/11/2018   AST 18 12/11/2018   ALKPHOS 38 (L) 12/11/2018   BILITOT 0.4 12/11/2018     Review of Systems  Constitutional: Negative for chills, fatigue and fever.  HENT: Negative for mouth sores and trouble swallowing.   Respiratory: Negative for cough, chest tightness and shortness of breath.   Cardiovascular: Negative for chest pain, palpitations  and leg swelling.  Gastrointestinal: Positive for diarrhea (occasional). Negative for abdominal pain, blood in stool and constipation.  Genitourinary: Negative for dysuria.  Musculoskeletal: Positive for arthralgias and gait problem.  Skin: Negative for rash.  Neurological: Negative for dizziness, light-headedness and headaches.  Psychiatric/Behavioral: Negative for dysphoric mood. The patient is not nervous/anxious.     Patient Active Problem List   Diagnosis Date Noted  . Mild peripheral edema 07/27/2019  . Bilateral foot pain 07/27/2019  . Atherosclerosis of abdominal aorta ([place]) 03/16/2018  . Primary osteoarthritis of left hip 11/25/2017  . DDD (degenerative disc disease), 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/2017  . Prediabetes 12/25/2016  . Nocturnal leg cramps 12/25/2016  . 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 Urinary incontinence in female 10/16/2016  . 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 Mixed hyperlipidemia 10/05/2015  . GERD (gastroesophageal reflux disease) 04/24/2015  . Crohn's disease of small intestine without complication ([place]) 80/32/1224  . Vitamin B 12 deficiency 06/21/2014  . Obstructive sleep apnea of adult 11/18/2013    Allergies  Allergen Reactions  . Fosamax [Alendronate Sodium] Other (See Comments)    Bones hurt  . Lipitor [Atorvastatin] Other (See Comments)    Bone pain  . Codeine Other (See Comments)     Codeine in the liquid form causes gastritis  . Hydrocodone Nausea And Vomiting    Past Surgical History:  Procedure Laterality Date  .  ABDOMINAL HYSTERECTOMY    . APPENDECTOMY    . CARPEL TUNNEL    . CHOLECYSTECTOMY    . COLONOSCOPY WITH PROPOFOL N/A 03/11/2016   Procedure: COLONOSCOPY WITH PROPOFOL;  Surgeon: Lollie Sails, MD;  Location: [facility] ENDOSCOPY;  Service: Endoscopy;  Laterality: N/A;  . DILATION AND CURETTAGE OF UTERUS    . ESOPHAGOGASTRODUODENOSCOPY N/A 03/11/2016   Procedure: ESOPHAGOGASTRODUODENOSCOPY (EGD);  Surgeon: Lollie Sails, MD;  Location:  [facility] ENDOSCOPY;  Service: Endoscopy;  Laterality: N/A;  . ESOPHAGOGASTRODUODENOSCOPY (EGD) WITH PROPOFOL N/A 11/11/2017   Procedure: ESOPHAGOGASTRODUODENOSCOPY (EGD) WITH PROPOFOL;  Surgeon: Lollie Sails, MD;  Location: [facility] ENDOSCOPY;  Service: Endoscopy;  Laterality: N/A;  . ESOPHAGOGASTRODUODENOSCOPY (EGD) WITH PROPOFOL N/A 01/20/2018   Procedure: ESOPHAGOGASTRODUODENOSCOPY (EGD) WITH PROPOFOL;  Surgeon: Lollie Sails, MD;  Location: [facility] ENDOSCOPY;  Service: Endoscopy;  Laterality: N/A;  . FOOT SURGERY Right   . HAMMER TOE SURGERY    . JOINT REPLACEMENT Right 2006   shoulder  . KNEE ARTHROSCOPY Right 05/25/2013   Procedure: ARTHROSCOPY KNEE;  Surgeon: Hessie Dibble, MD;  Location: [place];  Service: Orthopedics;  Laterality: Right;  medial and lateral meniscal debridment and chondroplasty  . ROBOTIC ASSISTED LAPAROSCOPIC REPAIR OF PARAESOPHAGEAL HERNIA  07/16/2018  . SHOULDER ARTHROSCOPY WITH ROTATOR CUFF REPAIR AND SUBACROMIAL DECOMPRESSION Left 08/14/2017   Procedure: SHOULDER ARTHROSCOPY WITH ROTATOR CUFF REPAIR AND SUBACROMIAL DECOMPRESSION;  Surgeon: Tania Ade, MD;  Location: [place];  Service: Orthopedics;  Laterality: Left;  . SHOULDER SURGERY Right    X 3  . TOTAL HIP ARTHROPLASTY Left 11/25/2017   Procedure: LEFT TOTAL HIP ARTHROPLASTY ANTERIOR APPROACH;  Surgeon: Melrose Nakayama, MD;  Location: [place];  Service: Orthopedics;  Laterality: Left;    Social History   Tobacco Use  . Smoking status: Former Smoker    Quit date: 12/29/2001    Years since quitting: 17.6  . Smokeless tobacco: Never Used  Vaping Use  . Vaping Use: Never used  Substance Use Topics  . Alcohol use: No  . Drug use: No     Medication list has been reviewed and updated.  Current Meds  Medication Sig  . aspirin EC 81 MG tablet Take 81 mg by mouth daily.  . Calcium Carbonate-Vit D-Min (CALCIUM 600+D3 PLUS MINERALS) 600-800 MG-UNIT TABS Take 1 tablet by mouth daily.    Marland Kitchen gabapentin (NEURONTIN) 300 MG capsule Take 2 capsules (600 mg total) by mouth 2 (two) times daily.  Marland Kitchen losartan (COZAAR) 50 MG tablet Take 1 tablet (50 mg total) by mouth daily.  . Magnesium 250 MG TABS Take 250 mg by mouth daily.   . meloxicam (MOBIC) 15 MG tablet Take 1 tablet (15 mg total) by mouth daily.  . mesalamine (PENTASA) 500 MG CR capsule Take 2,000 mg by mouth 2 (two) times daily.   . Multiple Vitamins-Minerals (MULTIVITAMIN WITH MINERALS) tablet Take 1 tablet by mouth daily.  . naproxen sodium (ALEVE) 220 MG tablet Take by mouth.  . NON FORMULARY BiPap nightly  . oxybutynin (DITROPAN-XL) 10 MG 24 hr tablet Take 1 tablet (10 mg total) by mouth at bedtime.  . Potassium 99 MG TABS Take 99 mg by mouth daily.   . pravastatin (PRAVACHOL) 20 MG tablet Take 1 tablet (20 mg total) by mouth at bedtime. (Patient taking differently: Take 40 mg by mouth at bedtime. )  . PROLIA 60 MG/ML SOSY injection every 6 (six) months.  . vitamin B-12 (CYANOCOBALAMIN) 1000 MCG tablet Take 1,000 mcg  by mouth daily.    PHQ 2/9 Scores 08/16/2019 07/27/2019 07/14/2019 03/17/2019  PHQ - 2 Score 1 0 2 0  PHQ- 9 Score 3 0 6 -    GAD 7 : Generalized Anxiety Score 08/16/2019 07/27/2019  Nervous, Anxious, on Edge 1 0  Control/stop worrying 1 0  Worry too much - different things 1 0  Trouble relaxing 0 0  Restless 0 0  Easily annoyed or irritable 0 0  Afraid - awful might happen 0 0  Total GAD 7 Score 3 0  Anxiety Difficulty Not difficult at all Not difficult at all    BP Readings from Last 3 Encounters:  08/16/19 122/72  07/27/19 112/64  07/14/19 124/70    Physical Exam Vitals and nursing note reviewed.  Constitutional:      General: She is not in acute distress.    Appearance: Normal appearance. She is well-developed.  HENT:     Head: Normocephalic and atraumatic.  Cardiovascular:     Rate and Rhythm: Normal rate and regular rhythm.  Pulmonary:     Effort: Pulmonary effort is normal. No  respiratory distress.     Breath sounds: No wheezing or rhonchi.  Musculoskeletal:     Cervical back: Normal range of motion.     Right lower leg: No edema.     Left lower leg: No edema.  Lymphadenopathy:     Cervical: No cervical adenopathy.  Skin:    General: Skin is warm and dry.     Capillary Refill: Capillary refill takes less than 2 seconds.     Findings: No rash.  Neurological:     General: No focal deficit present.     Mental Status: She is alert and oriented to person, place, and time.  Psychiatric:        Behavior: Behavior normal.        Thought Content: Thought content normal.     Wt Readings from Last 3 Encounters:  08/16/19 175 lb (79.4 kg)  07/27/19 176 lb (79.8 kg)  07/14/19 175 lb (79.4 kg)    BP 122/72   Pulse 67   Temp 98.1 F (36.7 C) (Oral)   Ht 5' 2"  (1.575 m)   Wt 175 lb (79.4 kg)   SpO2 94%   BMI 32.01 kg/m   Assessment and Plan: 1. Essential hypertension Clinically stable exam with well controlled BP on losartan. Tolerating medications without side effects at this time. Pt to continue current regimen and low sodium diet; benefits of regular exercise as able discussed.  2. Crohn's disease of small intestine without complication ([place]) Recent colonoscopy reported as normal. EGD done at the same time was normal s/p paraesophageal hernia repair  3. Atherosclerosis of abdominal aorta ([facility]) On appropriate statin and aspirin therapy  4. Urinary incontinence in female Minimal benefit from 10 mg ditropan.  Will increase to 15 mg. Consider adding myrbetriq - oxybutynin (DITROPAN XL) 15 MG 24 hr tablet; Take 1 tablet (15 mg total) by mouth at bedtime.  Dispense: 90 tablet; Refill: 0  5. Morton's neuroma of both feet Continue gabapentin 600 mg tid Can add a fourth dose if needed PRN Follow up with podiatry   Partially dictated using Editor, commissioning. Any errors are unintentional.  Halina Maidens, MD [facility]  08/16/2019

## 2019-08-31 ENCOUNTER — Other Ambulatory Visit: Payer: Self-pay

## 2019-08-31 ENCOUNTER — Ambulatory Visit (INDEPENDENT_AMBULATORY_CARE_PROVIDER_SITE_OTHER): Payer: Medicare Other | Admitting: Podiatry

## 2019-08-31 DIAGNOSIS — K30 Functional dyspepsia: Secondary | ICD-10-CM | POA: Insufficient documentation

## 2019-08-31 DIAGNOSIS — G5763 Lesion of plantar nerve, bilateral lower limbs: Secondary | ICD-10-CM

## 2019-08-31 NOTE — Progress Notes (Signed)
   HPI: 74 y.o. female presenting today for follow-up evaluation regarding Morton's neuromas to the bilateral feet.  Patient states that she has noticed significant improvement especially with the injections.  She also completed the Medrol Dosepak which helped to alleviate the sharp stabbing shooting sensation she would experience in her feet.  She still continues to feel numbness and she is currently taking gabapentin 600 mg 2 times daily from her PCP.  She has also been wearing wide fitting shoes.  Past Medical History:  Diagnosis Date  . Anemia   . Arthritis   . B12 deficiency   . Crohn disease ([place])   . Crohn's disease ([place])   . Depression   . Depression, major, single episode, complete remission ([place]) 10/28/2017  . Diverticulosis   . DJD (degenerative joint disease)   . DJD (degenerative joint disease)   . Dyspnea    with exertion (incline walking)  . Essential hypertension 08/16/2019  . Full dentures   . GERD (gastroesophageal reflux disease)   . Heart palpitations   . Hiatal hernia   . History of hiatal hernia   . Hypercholesterolemia   . Hyperlipidemia   . Incontinence of urine   . Medical history non-contributory   . Nocturnal leg cramps   . Osteopenia   . Osteoporosis   . Pre-diabetes   . Reflux   . Sleep apnea    uses a cpap  . Wears glasses      Physical Exam: General: The patient is alert and oriented x3 in no acute distress.  Dermatology: Skin is warm, dry and supple bilateral lower extremities. Negative for open lesions or macerations.  Vascular: Palpable pedal pulses bilaterally. No edema or erythema noted. Capillary refill within normal limits.  Neurological: Epicritic and protective threshold grossly intact bilaterally.   Musculoskeletal Exam: Range of motion within normal limits to all pedal and ankle joints bilateral. Muscle strength 5/5 in all groups bilateral.  Significant pain on palpation to the second and third interspaces of the bilateral feet  consistent with a Morton's neuroma.  Pain with lateral compression of the metatarsal heads also noted.  Assessment: 1.  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 Patient evaluated.  2.  Injection of 0.5 cc Celestone Soluspan injected into the second and third interspaces of the bilateral feet 3.  Discontinue meloxicam since she states that it makes her lightheaded  4.  Continue wearing wide fitting shoes that do not constrict the toe box  5.  Continue gabapentin 600 mg 2 times daily as per PCP  6.  Return to clinic as needed       Edrick Kins, DPM [facility]  Dr. Edrick Kins, DPM    [address],  [address]                Office [phone]  Fax [phone]

## 2019-09-09 ENCOUNTER — Ambulatory Visit
Admission: RE | Admit: 2019-09-09 | Discharge: 2019-09-09 | Disposition: A | Discharge status: Home / Self Care | Payer: Medicare Other | Source: Ambulatory Visit | Attending: Internal Medicine | Admitting: Internal Medicine

## 2019-09-09 DIAGNOSIS — Z1231 Encounter for screening mammogram for malignant neoplasm of breast: Secondary | ICD-10-CM | POA: Diagnosis present

## 2019-09-10 ENCOUNTER — Other Ambulatory Visit: Payer: Self-pay | Admitting: Gastroenterology

## 2019-09-10 DIAGNOSIS — R6881 Early satiety: Secondary | ICD-10-CM

## 2019-09-10 DIAGNOSIS — R1110 Vomiting, unspecified: Secondary | ICD-10-CM

## 2019-09-10 DIAGNOSIS — Z8719 Personal history of other diseases of the digestive system: Secondary | ICD-10-CM

## 2019-09-21 ENCOUNTER — Ambulatory Visit
Admission: RE | Admit: 2019-09-21 | Discharge: 2019-09-21 | Disposition: A | Discharge status: Home / Self Care | Payer: Medicare Other | Source: Ambulatory Visit | Attending: Gastroenterology | Admitting: Gastroenterology

## 2019-09-21 ENCOUNTER — Other Ambulatory Visit: Payer: Self-pay

## 2019-09-21 DIAGNOSIS — Z9889 Other specified postprocedural states: Secondary | ICD-10-CM | POA: Insufficient documentation

## 2019-09-21 DIAGNOSIS — R6881 Early satiety: Secondary | ICD-10-CM | POA: Diagnosis not present

## 2019-09-21 DIAGNOSIS — Z8719 Personal history of other diseases of the digestive system: Secondary | ICD-10-CM

## 2019-09-21 DIAGNOSIS — R1110 Vomiting, unspecified: Secondary | ICD-10-CM | POA: Insufficient documentation

## 2019-09-21 MED ORDER — TECHNETIUM TC 99M SULFUR COLLOID
2.0000 | Freq: Once | INTRAVENOUS | Status: AC | PRN
  Administered 2019-09-21: 2.912 via ORAL

## 2019-10-22 ENCOUNTER — Ambulatory Visit (INDEPENDENT_AMBULATORY_CARE_PROVIDER_SITE_OTHER): Payer: Medicare Other | Admitting: Internal Medicine

## 2019-10-22 ENCOUNTER — Encounter: Payer: Self-pay | Admitting: Internal Medicine

## 2019-10-22 VITALS — BP 112/58 | HR 80 | Temp 99.1°F | Ht 62.0 in | Wt 168.0 lb

## 2019-10-22 DIAGNOSIS — J0100 Acute maxillary sinusitis, unspecified: Secondary | ICD-10-CM | POA: Diagnosis not present

## 2019-10-22 DIAGNOSIS — H1033 Unspecified acute conjunctivitis, bilateral: Secondary | ICD-10-CM

## 2019-10-22 MED ORDER — NEOMYCIN-POLYMYXIN-DEXAMETH 3.5-10000-0.1 OP SUSP: 2.0000 [drp] | Freq: Four times a day (QID) | OPHTHALMIC | 0 refills | Status: AC

## 2019-10-22 MED ORDER — AZITHROMYCIN 250 MG PO TABS: ORAL_TABLET | ORAL | 0 refills | Status: AC

## 2019-10-22 NOTE — Progress Notes (Signed)
Date:  10/22/2019   Name:  Kimberly Andrade   DOB:  07/13/1945   MRN:  [id number]   Chief Complaint: Cough (X1 week sore throat, cough with drainage blowing out green mucous, left ear pain, both eyes red,swollen, itching and draining, no SOB )  Conjunctivitis  The current episode started yesterday. The onset was sudden. The problem has been unchanged. The problem is moderate. Associated symptoms include eye itching, ear pain, cough, eye discharge and eye redness. Pertinent negatives include no fever, no abdominal pain, no diarrhea, no nausea, no vomiting and no wheezing. The eye pain is mild. Both eyes are affected. Cough This is a new problem. The current episode started in the past 7 days. The cough is productive of purulent sputum. Associated symptoms include ear pain, eye redness, nasal congestion, postnasal drip and sweats. Pertinent negatives include no chest pain, chills, fever, shortness of breath or wheezing. Her past medical history is significant for environmental allergies.    Lab Results  Component Value Date   CREATININE 0.99 12/11/2018   BUN 16 12/11/2018   NA 146 (H) 12/11/2018   K 4.3 12/11/2018   CL 108 (H) 12/11/2018   CO2 23 12/11/2018   Lab Results  Component Value Date   CHOL 185 12/11/2018   HDL 55 12/11/2018   LDLCALC 103 (H) 12/11/2018   TRIG 155 (H) 12/11/2018   CHOLHDL 3.4 12/11/2018   Lab Results  Component Value Date   TSH 2.250 12/11/2018   Lab Results  Component Value Date   HGBA1C 5.8 (H) 12/11/2018   Lab Results  Component Value Date   WBC 6.8 12/11/2018   HGB 13.4 12/11/2018   HCT 40.4 12/11/2018   MCV 89 12/11/2018   PLT 284 12/11/2018   Lab Results  Component Value Date   ALT 15 12/11/2018   AST 18 12/11/2018   ALKPHOS 38 (L) 12/11/2018   BILITOT 0.4 12/11/2018     Review of Systems  Constitutional: Positive for diaphoresis. Negative for chills and fever.  HENT: Positive for ear pain and postnasal drip. Negative for  trouble swallowing.   Eyes: Positive for discharge, redness and itching.  Respiratory: Positive for cough. Negative for shortness of breath and wheezing.   Cardiovascular: Negative for chest pain and palpitations.  Gastrointestinal: Negative for abdominal pain, diarrhea, nausea and vomiting.  Allergic/Immunologic: Positive for environmental allergies.    Patient Active Problem List   Diagnosis Date Noted  . Essential hypertension 08/16/2019  . Mild peripheral edema 07/27/2019  . Morton's neuroma 07/27/2019  . Atherosclerosis of abdominal aorta ([place]) 03/16/2018  . DDD (degenerative disc disease), 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/2017  . Prediabetes 12/25/2016  . Nocturnal leg cramps 12/25/2016  . 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 Urinary incontinence in female 10/16/2016  . Mixed hyperlipidemia 10/05/2015  . GERD (gastroesophageal reflux disease) 04/24/2015  . Crohn's disease of small intestine without complication ([place]) 27/09/8673  . Obstructive sleep apnea of adult 11/18/2013    Allergies  Allergen Reactions  . Fosamax [Alendronate Sodium] Other (See Comments)    Bones hurt  . Lipitor [Atorvastatin] Other (See Comments)    Bone pain  . Codeine Other (See Comments)     Codeine in the liquid form causes gastritis  . Hydrocodone Nausea And Vomiting    Past Surgical History:  Procedure Laterality Date  . ABDOMINAL HYSTERECTOMY    . APPENDECTOMY    . CARPEL TUNNEL    . CHOLECYSTECTOMY    . COLONOSCOPY WITH PROPOFOL N/A 03/11/2016  Procedure: COLONOSCOPY WITH PROPOFOL;  Surgeon: Lollie Sails, MD;  Location: [facility] ENDOSCOPY;  Service: Endoscopy;  Laterality: N/A;  . DILATION AND CURETTAGE OF UTERUS    . ESOPHAGOGASTRODUODENOSCOPY N/A 03/11/2016   Procedure: ESOPHAGOGASTRODUODENOSCOPY (EGD);  Surgeon: Lollie Sails, MD;  Location: [facility] ENDOSCOPY;  Service: Endoscopy;  Laterality: N/A;  . ESOPHAGOGASTRODUODENOSCOPY (EGD) WITH PROPOFOL N/A 11/11/2017   Procedure:  ESOPHAGOGASTRODUODENOSCOPY (EGD) WITH PROPOFOL;  Surgeon: Lollie Sails, MD;  Location: [facility] ENDOSCOPY;  Service: Endoscopy;  Laterality: N/A;  . ESOPHAGOGASTRODUODENOSCOPY (EGD) WITH PROPOFOL N/A 01/20/2018   Procedure: ESOPHAGOGASTRODUODENOSCOPY (EGD) WITH PROPOFOL;  Surgeon: Lollie Sails, MD;  Location: [facility] ENDOSCOPY;  Service: Endoscopy;  Laterality: N/A;  . FOOT SURGERY Right   . HAMMER TOE SURGERY    . JOINT REPLACEMENT Right 2006   shoulder  . KNEE ARTHROSCOPY Right 05/25/2013   Procedure: ARTHROSCOPY KNEE;  Surgeon: Hessie Dibble, MD;  Location: [place];  Service: Orthopedics;  Laterality: Right;  medial and lateral meniscal debridment and chondroplasty  . ROBOTIC ASSISTED LAPAROSCOPIC REPAIR OF PARAESOPHAGEAL HERNIA  07/16/2018  . SHOULDER ARTHROSCOPY WITH ROTATOR CUFF REPAIR AND SUBACROMIAL DECOMPRESSION Left 08/14/2017   Procedure: SHOULDER ARTHROSCOPY WITH ROTATOR CUFF REPAIR AND SUBACROMIAL DECOMPRESSION;  Surgeon: Tania Ade, MD;  Location: [place];  Service: Orthopedics;  Laterality: Left;  . SHOULDER SURGERY Right    X 3  . TOTAL HIP ARTHROPLASTY Left 11/25/2017   Procedure: LEFT TOTAL HIP ARTHROPLASTY ANTERIOR APPROACH;  Surgeon: Melrose Nakayama, MD;  Location: [place];  Service: Orthopedics;  Laterality: Left;    Social History   Tobacco Use  . Smoking status: Former Smoker    Quit date: 12/29/2001    Years since quitting: 17.8  . Smokeless tobacco: Never Used  Vaping Use  . Vaping Use: Never used  Substance Use Topics  . Alcohol use: No  . Drug use: No     Medication list has been reviewed and updated.  Current Meds  Medication Sig  . aspirin EC 81 MG tablet Take 81 mg by mouth daily.  . Calcium Carbonate-Vit D-Min (CALCIUM 600+D3 PLUS MINERALS) 600-800 MG-UNIT TABS Take 1 tablet by mouth daily.  Marland Kitchen gabapentin (NEURONTIN) 300 MG capsule Take 2 capsules (600 mg total) by mouth 2 (two) times daily.  Marland Kitchen losartan (COZAAR) 50 MG  tablet Take 1 tablet (50 mg total) by mouth daily.  . Magnesium 250 MG TABS Take 250 mg by mouth daily.   . mesalamine (PENTASA) 500 MG CR capsule Take 2,000 mg by mouth 2 (two) times daily.   . Multiple Vitamins-Minerals (MULTIVITAMIN WITH MINERALS) tablet Take 1 tablet by mouth daily.  . naproxen sodium (ALEVE) 220 MG tablet Take by mouth.  . NON FORMULARY BiPap nightly  . oxybutynin (DITROPAN XL) 15 MG 24 hr tablet Take 1 tablet (15 mg total) by mouth at bedtime.  . Potassium 99 MG TABS Take 99 mg by mouth daily.   . pravastatin (PRAVACHOL) 20 MG tablet Take 1 tablet (20 mg total) by mouth at bedtime. (Patient taking differently: Take 40 mg by mouth at bedtime. )  . PROLIA 60 MG/ML SOSY injection every 6 (six) months.  . vitamin B-12 (CYANOCOBALAMIN) 1000 MCG tablet Take 1,000 mcg by mouth daily.    PHQ 2/9 Scores 08/16/2019 07/27/2019 07/14/2019 03/17/2019  PHQ - 2 Score 1 0 2 0  PHQ- 9 Score 3 0 6 -    GAD 7 : Generalized Anxiety Score 08/16/2019 07/27/2019  Nervous, Anxious, on  Edge 1 0  Control/stop worrying 1 0  Worry too much - different things 1 0  Trouble relaxing 0 0  Restless 0 0  Easily annoyed or irritable 0 0  Afraid - awful might happen 0 0  Total GAD 7 Score 3 0  Anxiety Difficulty Not difficult at all Not difficult at all    BP Readings from Last 3 Encounters:  10/22/19 (!) 112/58  08/16/19 122/72  07/27/19 112/64    Physical Exam Constitutional:      Appearance: She is well-developed. She is ill-appearing.  HENT:     Right Ear: Tympanic membrane, ear canal and external ear normal. Tympanic membrane is not erythematous or retracted.     Left Ear: Tympanic membrane, ear canal and external ear normal. Tympanic membrane is not erythematous or retracted.     Nose:     Right Sinus: Maxillary sinus tenderness present. No frontal sinus tenderness.     Left Sinus: Maxillary sinus tenderness present. No frontal sinus tenderness.     Mouth/Throat:     Mouth: No oral  lesions.     Pharynx: Uvula midline. Posterior oropharyngeal erythema present. No oropharyngeal exudate.  Eyes:     General:        Right eye: Discharge present.        Left eye: Discharge present.    Extraocular Movements: Extraocular movements intact.     Conjunctiva/sclera:     Right eye: Chemosis present.     Left eye: Chemosis present.  Cardiovascular:     Rate and Rhythm: Normal rate and regular rhythm.     Pulses: Normal pulses.     Heart sounds: Normal heart sounds.  Pulmonary:     Effort: Pulmonary effort is normal.     Breath sounds: Normal breath sounds. No wheezing or rales.  Musculoskeletal:     Cervical back: Normal range of motion.  Lymphadenopathy:     Cervical: No cervical adenopathy.  Neurological:     Mental Status: She is alert and oriented to person, place, and time.     Wt Readings from Last 3 Encounters:  10/22/19 168 lb (76.2 kg)  08/16/19 175 lb (79.4 kg)  07/27/19 176 lb (79.8 kg)    BP (!) 112/58   Pulse 80   Temp 99.1 F (37.3 C) (Oral)   Ht 5' 2"  (1.575 m)   Wt 168 lb (76.2 kg)   SpO2 93%   BMI 30.73 kg/m   Assessment and Plan: 1. Acute non-recurrent maxillary sinusitis Begin Flonase Continue Nyquil for cough If no improvement in several days or worsening fever, recommend Covid testing - azithromycin (ZITHROMAX Z-PAK) 250 MG tablet; UAD  Dispense: 6 each; Refill: 0  2. Acute bacterial conjunctivitis of both eyes Topical drops x 7 days minimum - neomycin-polymyxin b-dexamethasone (MAXITROL) 3.5-10000-0.1 SUSP; Place 2 drops into both eyes every 6 (six) hours for 10 days.  Dispense: 5 mL; Refill: 0   Partially dictated using Editor, commissioning. Any errors are unintentional.  Halina Maidens, MD [facility]  10/22/2019

## 2019-11-03 ENCOUNTER — Encounter: Payer: Self-pay | Admitting: Internal Medicine

## 2019-11-11 ENCOUNTER — Other Ambulatory Visit: Payer: Self-pay

## 2019-11-11 ENCOUNTER — Ambulatory Visit (INDEPENDENT_AMBULATORY_CARE_PROVIDER_SITE_OTHER): Payer: Medicare Other | Admitting: Internal Medicine

## 2019-11-11 ENCOUNTER — Encounter: Payer: Self-pay | Admitting: Internal Medicine

## 2019-11-11 VITALS — BP 138/80 | HR 62 | Temp 98.7°F | Ht 62.0 in | Wt 176.0 lb

## 2019-11-11 DIAGNOSIS — R609 Edema, unspecified: Secondary | ICD-10-CM

## 2019-11-11 DIAGNOSIS — I10 Essential (primary) hypertension: Secondary | ICD-10-CM

## 2019-11-11 MED ORDER — HYDROCHLOROTHIAZIDE 25 MG PO TABS: 25.0000 mg | ORAL_TABLET | Freq: Every day | ORAL | 0 refills | Status: DC

## 2019-11-11 NOTE — Progress Notes (Signed)
Date:  11/11/2019   Name:  Kimberly Andrade   DOB:  1945-11-21   MRN:  [id number]   Chief Complaint: Foot Swelling (X2 weeks, both feet left is worse, not painful )  Swelling - started about 2 weeks ago with swelling in feet but mostly on the left.  No new medications, no travel, no pain or redness, no hx of venous issues or varicose veins. No change in diet or increase in sodium intake.  No improvement with elevation.   Lab Results  Component Value Date   CREATININE 0.99 12/11/2018   BUN 16 12/11/2018   NA 146 (H) 12/11/2018   K 4.3 12/11/2018   CL 108 (H) 12/11/2018   CO2 23 12/11/2018   Lab Results  Component Value Date   CHOL 185 12/11/2018   HDL 55 12/11/2018   LDLCALC 103 (H) 12/11/2018   TRIG 155 (H) 12/11/2018   CHOLHDL 3.4 12/11/2018   Lab Results  Component Value Date   TSH 2.250 12/11/2018   Lab Results  Component Value Date   HGBA1C 5.8 (H) 12/11/2018   Lab Results  Component Value Date   WBC 6.8 12/11/2018   HGB 13.4 12/11/2018   HCT 40.4 12/11/2018   MCV 89 12/11/2018   PLT 284 12/11/2018   Lab Results  Component Value Date   ALT 15 12/11/2018   AST 18 12/11/2018   ALKPHOS 38 (L) 12/11/2018   BILITOT 0.4 12/11/2018     Review of Systems  Constitutional: Negative for chills, fatigue and fever.  Respiratory: Negative for cough, chest tightness, shortness of breath and wheezing.   Cardiovascular: Positive for leg swelling. Negative for chest pain and palpitations.  Skin: Negative for color change.  Neurological: Negative for dizziness and headaches.  Psychiatric/Behavioral: Negative for dysphoric mood and sleep disturbance. The patient is not nervous/anxious.     Patient Active Problem List   Diagnosis Date Noted  . Essential hypertension 08/16/2019  . Mild peripheral edema 07/27/2019  . Morton's neuroma 07/27/2019  . Atherosclerosis of abdominal aorta ([place]) 03/16/2018  . DDD (degenerative disc disease), 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/2017  .  Prediabetes 12/25/2016  . Nocturnal leg cramps 12/25/2016  . 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 Urinary incontinence in female 10/16/2016  . Mixed hyperlipidemia 10/05/2015  . GERD (gastroesophageal reflux disease) 04/24/2015  . Crohn's disease of small intestine without complication ([place]) 67/59/1638  . Obstructive sleep apnea of adult 11/18/2013    Allergies  Allergen Reactions  . Fosamax [Alendronate Sodium] Other (See Comments)    Bones hurt  . Lipitor [Atorvastatin] Other (See Comments)    Bone pain  . Codeine Other (See Comments)     Codeine in the liquid form causes gastritis  . Hydrocodone Nausea And Vomiting    Past Surgical History:  Procedure Laterality Date  . ABDOMINAL HYSTERECTOMY    . APPENDECTOMY    . CARPEL TUNNEL    . CHOLECYSTECTOMY    . COLONOSCOPY WITH PROPOFOL N/A 03/11/2016   Procedure: COLONOSCOPY WITH PROPOFOL;  Surgeon: Lollie Sails, MD;  Location: [facility] ENDOSCOPY;  Service: Endoscopy;  Laterality: N/A;  . DILATION AND CURETTAGE OF UTERUS    . ESOPHAGOGASTRODUODENOSCOPY N/A 03/11/2016   Procedure: ESOPHAGOGASTRODUODENOSCOPY (EGD);  Surgeon: Lollie Sails, MD;  Location: [facility] ENDOSCOPY;  Service: Endoscopy;  Laterality: N/A;  . ESOPHAGOGASTRODUODENOSCOPY (EGD) WITH PROPOFOL N/A 11/11/2017   Procedure: ESOPHAGOGASTRODUODENOSCOPY (EGD) WITH PROPOFOL;  Surgeon: Lollie Sails, MD;  Location: [facility] ENDOSCOPY;  Service: Endoscopy;  Laterality: N/A;  .  ESOPHAGOGASTRODUODENOSCOPY (EGD) WITH PROPOFOL N/A 01/20/2018   Procedure: ESOPHAGOGASTRODUODENOSCOPY (EGD) WITH PROPOFOL;  Surgeon: Lollie Sails, MD;  Location: [facility] ENDOSCOPY;  Service: Endoscopy;  Laterality: N/A;  . FOOT SURGERY Right   . HAMMER TOE SURGERY    . JOINT REPLACEMENT Right 2006   shoulder  . KNEE ARTHROSCOPY Right 05/25/2013   Procedure: ARTHROSCOPY KNEE;  Surgeon: Hessie Dibble, MD;  Location: [place];  Service: Orthopedics;  Laterality: Right;  medial  and lateral meniscal debridment and chondroplasty  . ROBOTIC ASSISTED LAPAROSCOPIC REPAIR OF PARAESOPHAGEAL HERNIA  07/16/2018  . SHOULDER ARTHROSCOPY WITH ROTATOR CUFF REPAIR AND SUBACROMIAL DECOMPRESSION Left 08/14/2017   Procedure: SHOULDER ARTHROSCOPY WITH ROTATOR CUFF REPAIR AND SUBACROMIAL DECOMPRESSION;  Surgeon: Tania Ade, MD;  Location: [place];  Service: Orthopedics;  Laterality: Left;  . SHOULDER SURGERY Right    X 3  . TOTAL HIP ARTHROPLASTY Left 11/25/2017   Procedure: LEFT TOTAL HIP ARTHROPLASTY ANTERIOR APPROACH;  Surgeon: Melrose Nakayama, MD;  Location: [place];  Service: Orthopedics;  Laterality: Left;    Social History   Tobacco Use  . Smoking status: Former Smoker    Quit date: 12/29/2001    Years since quitting: 17.8  . Smokeless tobacco: Never Used  Vaping Use  . Vaping Use: Never used  Substance Use Topics  . Alcohol use: No  . Drug use: No     Medication list has been reviewed and updated.  Current Meds  Medication Sig  . Ascorbic Acid (VITAMIN C PO) Take by mouth.  Marland Kitchen aspirin EC 81 MG tablet Take 81 mg by mouth daily.  . Calcium Carbonate-Vit D-Min (CALCIUM 600+D3 PLUS MINERALS) 600-800 MG-UNIT TABS Take 1 tablet by mouth daily.  . Cholecalciferol (VITAMIN D3 PO) Take by mouth.  . gabapentin (NEURONTIN) 300 MG capsule Take 2 capsules (600 mg total) by mouth 2 (two) times daily.  Marland Kitchen losartan (COZAAR) 50 MG tablet Take 1 tablet (50 mg total) by mouth daily.  . Magnesium 250 MG TABS Take 250 mg by mouth daily.   . mesalamine (PENTASA) 500 MG CR capsule Take 2,000 mg by mouth 2 (two) times daily.   . Multiple Vitamins-Minerals (MULTIVITAMIN WITH MINERALS) tablet Take 1 tablet by mouth daily.  . Multiple Vitamins-Minerals (ZINC PO) Take by mouth.  . naproxen sodium (ALEVE) 220 MG tablet Take by mouth.  . NON FORMULARY BiPap nightly  . oxybutynin (DITROPAN XL) 15 MG 24 hr tablet Take 1 tablet (15 mg total) by mouth at bedtime.  . Potassium 99 MG TABS Take 99  mg by mouth daily.   . pravastatin (PRAVACHOL) 20 MG tablet Take 1 tablet (20 mg total) by mouth at bedtime. (Patient taking differently: Take 40 mg by mouth at bedtime. )  . PROLIA 60 MG/ML SOSY injection every 6 (six) months.  . vitamin B-12 (CYANOCOBALAMIN) 1000 MCG tablet Take 1,000 mcg by mouth daily.    PHQ 2/9 Scores 08/16/2019 07/27/2019 07/14/2019 03/17/2019  PHQ - 2 Score 1 0 2 0  PHQ- 9 Score 3 0 6 -    GAD 7 : Generalized Anxiety Score 08/16/2019 07/27/2019  Nervous, Anxious, on Edge 1 0  Control/stop worrying 1 0  Worry too much - different things 1 0  Trouble relaxing 0 0  Restless 0 0  Easily annoyed or irritable 0 0  Afraid - awful might happen 0 0  Total GAD 7 Score 3 0  Anxiety Difficulty Not difficult at all Not difficult at all  BP Readings from Last 3 Encounters:  11/11/19 (!) 142/82  10/22/19 (!) 112/58  08/16/19 122/72    Physical Exam Vitals and nursing note reviewed.  Constitutional:      General: She is not in acute distress.    Appearance: Normal appearance. She is well-developed.  HENT:     Head: Normocephalic and atraumatic.  Cardiovascular:     Rate and Rhythm: Normal rate and regular rhythm.     Pulses:          Dorsalis pedis pulses are 2+ on the right side and 2+ on the left side.       Posterior tibial pulses are 1+ on the right side and 1+ on the left side.     Comments: No varicose veins. No calf cord or tenderness; no redness or warmth Homan's sign negative bilaterally Pulmonary:     Effort: Pulmonary effort is normal. No respiratory distress.     Breath sounds: No wheezing or rhonchi.  Musculoskeletal:        General: Normal range of motion.     Right lower leg: 1+ Pitting Edema present.     Left lower leg: 2+ Pitting Edema present.  Skin:    General: Skin is warm and dry.     Findings: No rash.  Neurological:     Mental Status: She is alert and oriented to person, place, and time.  Psychiatric:        Behavior: Behavior  normal.        Thought Content: Thought content normal.     Wt Readings from Last 3 Encounters:  11/11/19 176 lb (79.8 kg)  10/22/19 168 lb (76.2 kg)  08/16/19 175 lb (79.4 kg)    BP (!) 142/82   Pulse 62   Temp 98.7 F (37.1 C) (Oral)   Ht 5' 2"  (1.575 m)   Wt 176 lb (79.8 kg)   SpO2 95%   BMI 32.19 kg/m   Assessment and Plan: 1. Dependent edema Uncertain cause - continue elevation Start HCTZ intermittently as needed - hydrochlorothiazide (HYDRODIURIL) 25 MG tablet; Take 1 tablet (25 mg total) by mouth daily.  Dispense: 30 tablet; Refill: 0  2. Essential hypertension Clinically stable exam with well controlled BP on losartan. Could tolerate addition of daily HCTZ if needed Pt to continue current regimen and low sodium diet; benefits of regular exercise as able discussed.   Partially dictated using Editor, commissioning. Any errors are unintentional.  Halina Maidens, MD [facility]  11/11/2019

## 2019-11-11 NOTE — Patient Instructions (Signed)
Take one HCTZ daily for 3-4 days.  Drink more water during this time.  Elevate legs whenever you are sitting if able.

## 2019-11-22 IMAGING — CT CT CHEST W/ CM
3 of 5 series · 16 of 36 positions shown, 18 images · IV contrast (iopamidol)
Comparison: 04/29/2017 chest radiograph.  No comparison CTs.

CLINICAL DATA: Hiatal hernia. Dysphagia. Coughing and vomiting.
Shortness of breath increasing over 6 years. Sleep apnea. Ex-smoker.
Cholecystectomy.

EXAM:
CT CHEST AND ABDOMEN WITH CONTRAST
TECHNIQUE: Multidetector CT imaging of the chest and abdomen was performed
following the standard protocol during bolus administration of
intravenous contrast.
CONTRAST:  100mL [id number] IOPAMIDOL ([id number]) INJECTION 61%

[Series 2: cap with · axial · 0.68mm/px · z∈[-385,-90]mm · 7 of 79 slices shown, 9 images]
[im 10/79  mediastinal]
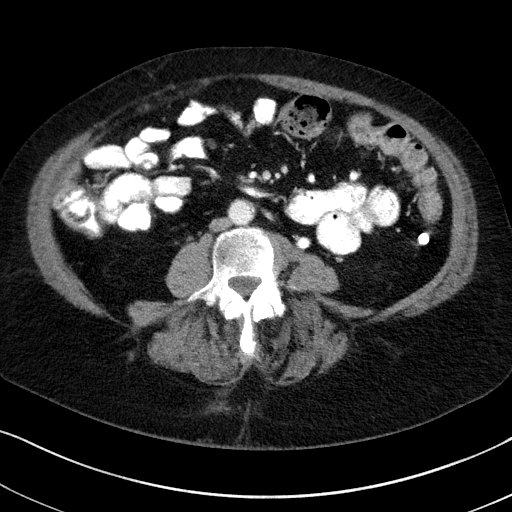
[im 10/79  lung]
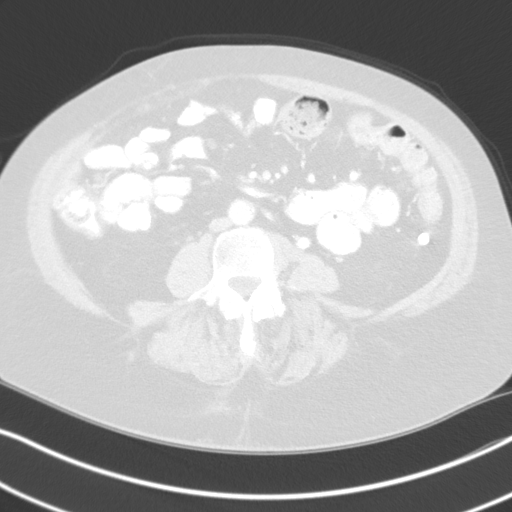
[im 20/79  lung]
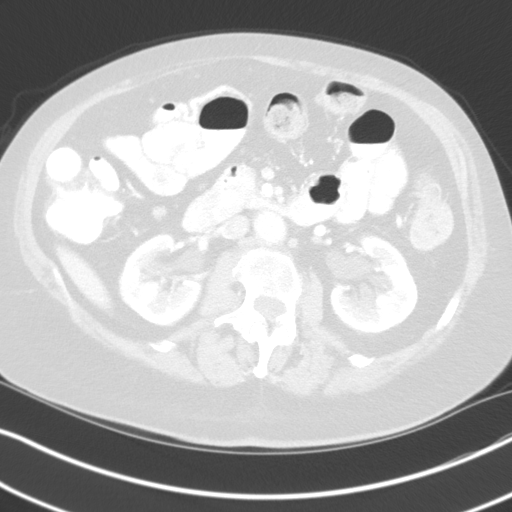
[im 30/79  lung]
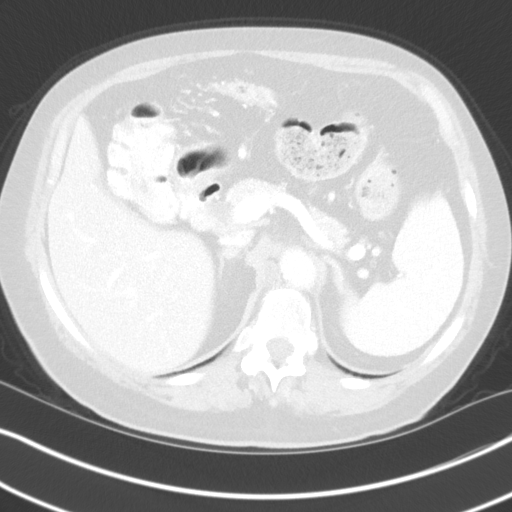
[im 40/79  lung]
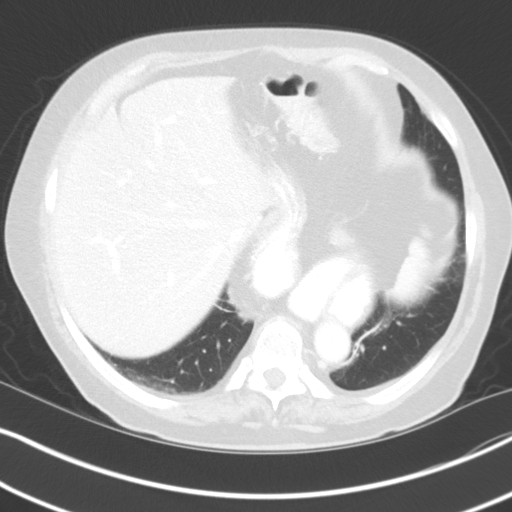
[im 49/79  mediastinal]
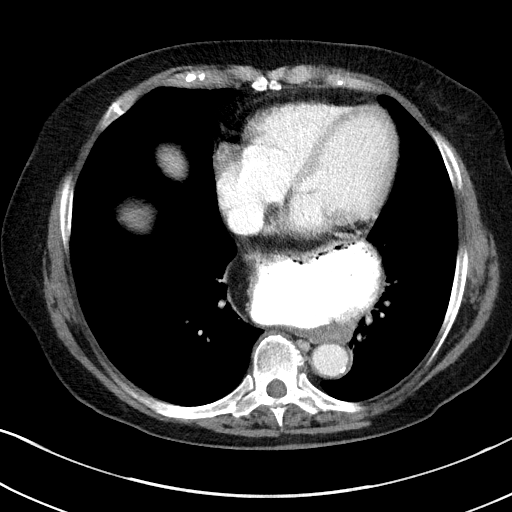
[im 49/79  lung]
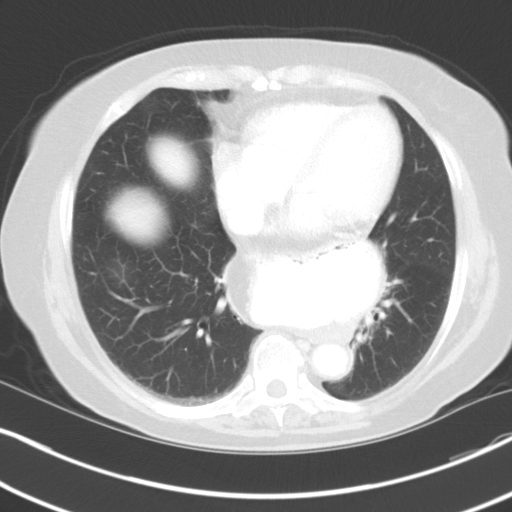
[im 59/79  lung]
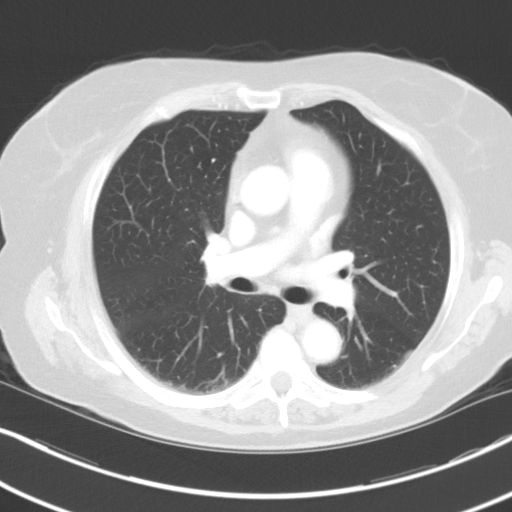
[im 69/79  lung]
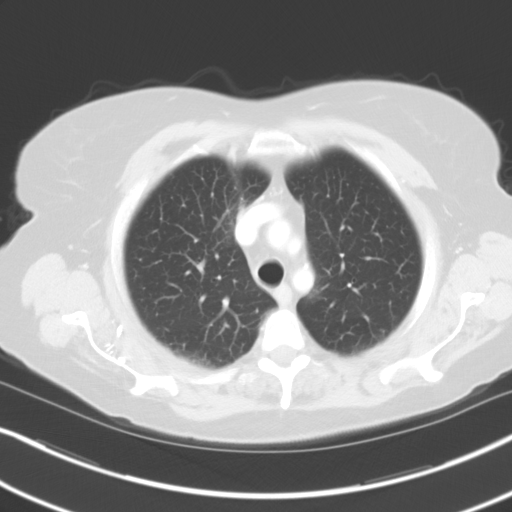

[Series 4: lung · axial · 0.68mm/px · z∈[-286,-136]mm · 6 of 133 slices shown]
[im 10/133  lung]
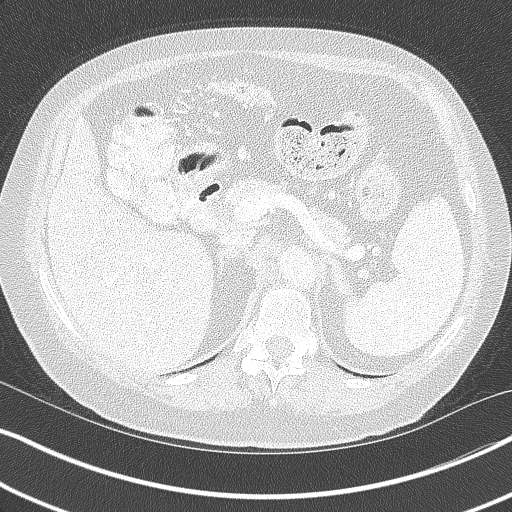
[im 29/133  lung]
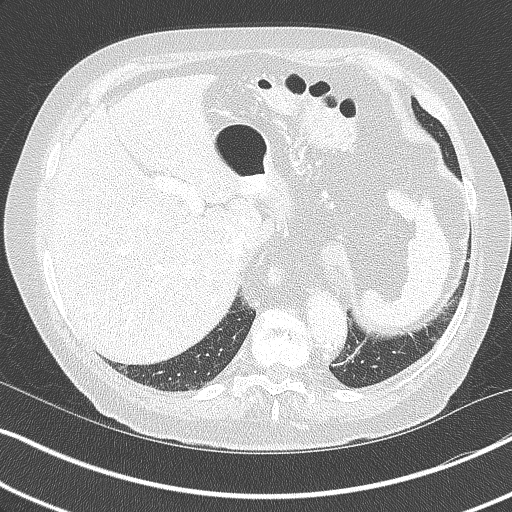
[im 48/133  lung]
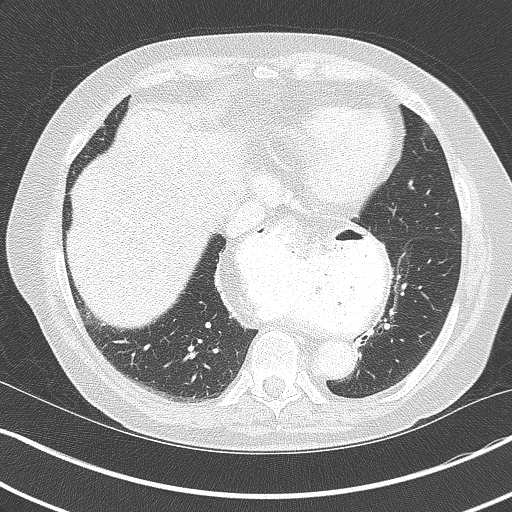
[im 57/133  lung]
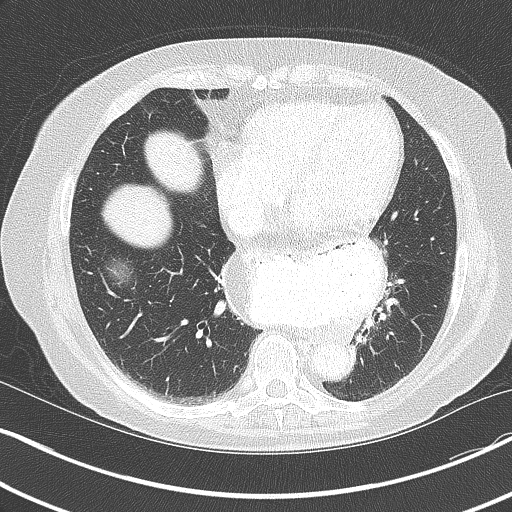
[im 76/133  lung]
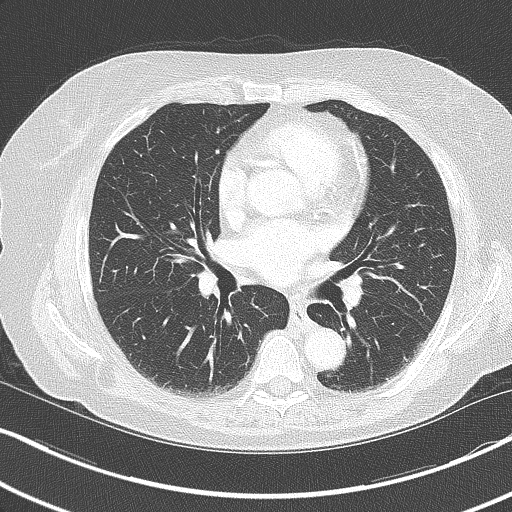
[im 85/133  lung]
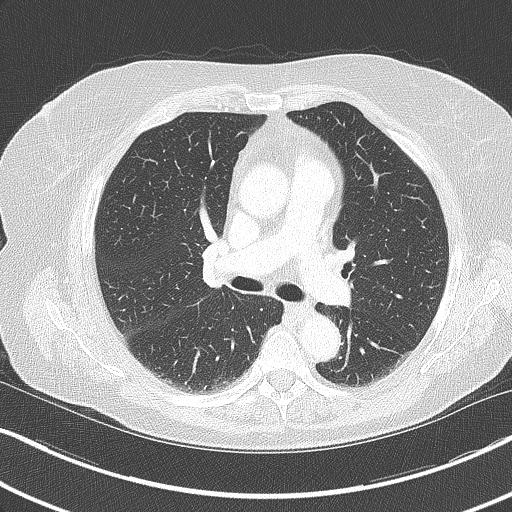

[Series 5: coronals · coronal · 0.69mm/px · 3 of 137 slices shown]
[im 28/137  lung]
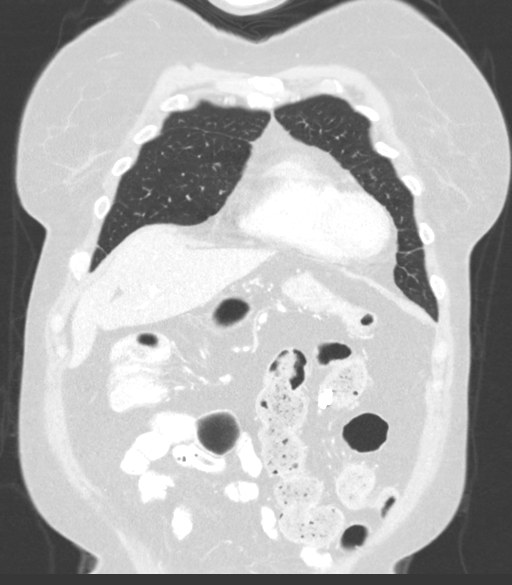
[im 55/137  lung]
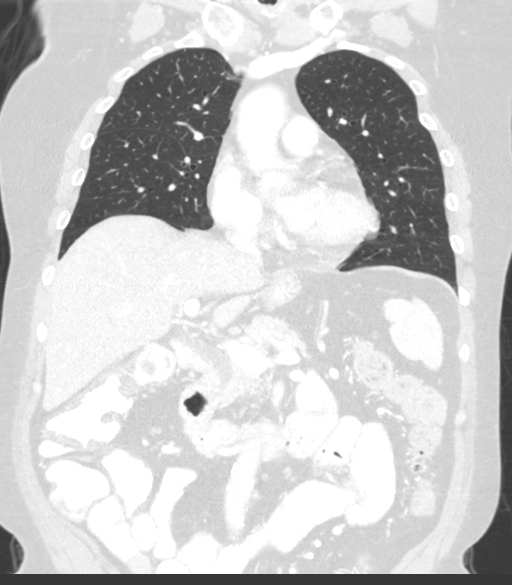
[im 82/137  lung]
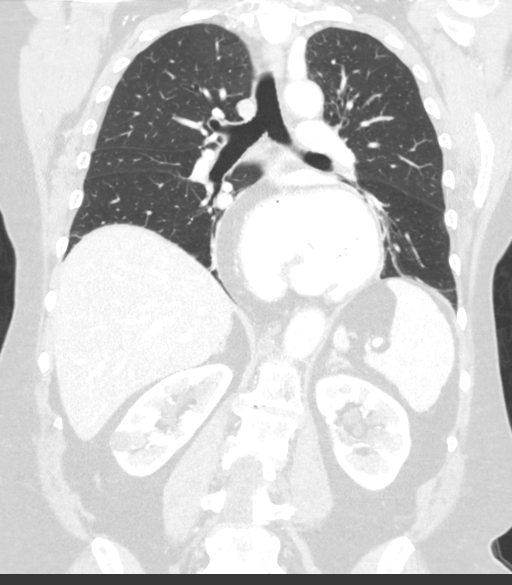

[16 of 36 positions shown; findings below may reference images not displayed]

FINDINGS: CT CHEST FINDINGS

Cardiovascular: Aortic and branch vessel atherosclerosis. Tortuous
thoracic aorta. Normal heart size, without pericardial effusion.

Mediastinum/Nodes: No mediastinal or hilar adenopathy. A moderate to
large hiatal hernia, with greater than [DATE] of the stomach positioned
in the chest. No evidence of volvulus. Dilated thoracic duct,
including on image 48/2.

Lungs/Pleura: No pleural fluid. Left lower lobe volume loss and
subsegmental atelectasis adjacent to the hiatal hernia.

Musculoskeletal: No acute osseous abnormality. Incompletely imaged
right shoulder arthroplasty.

CT ABDOMEN FINDINGS

Hepatobiliary: Normal liver. Cholecystectomy, without biliary ductal
dilatation.

Pancreas: Normal, without mass or ductal dilatation.

Spleen: Normal in size, without focal abnormality.

Adrenals/Urinary Tract: Normal adrenal glands. Left renal sinus
cysts. Interpolar right renal 11 mm cyst.

Stomach/Bowel: Normal distal most stomach. Scattered colonic
diverticula. Normal abdominal portions of the small bowel and
terminal ileum.

Vascular/Lymphatic: Aortic and branch vessel atherosclerosis. No
abdominal adenopathy.

Other: No ascites.

Musculoskeletal: Grade 1 L4-5 anterolisthesis. Lumbosacral junction
spondylosis.
IMPRESSION: 1. Moderate to large hiatal hernia, with greater than [DATE] of the
stomach positioned within the chest. No acute complication.
2.  Aortic Atherosclerosis ([id number]).

## 2019-12-10 ENCOUNTER — Other Ambulatory Visit: Payer: Self-pay | Admitting: Internal Medicine

## 2019-12-10 DIAGNOSIS — R32 Unspecified urinary incontinence: Secondary | ICD-10-CM

## 2019-12-10 MED ORDER — OXYBUTYNIN CHLORIDE ER 15 MG PO TB24: 15.0000 mg | ORAL_TABLET | Freq: Every day | ORAL | 0 refills | Status: DC

## 2019-12-10 NOTE — Telephone Encounter (Signed)
Medication Refill - Medication: oxybutynin   Has the patient contacted their pharmacy? Yes.   (Agent: If no, request that the patient contact the pharmacy for the refill.) (Agent: If yes, when and what did the pharmacy advise?)  Preferred Pharmacy (with phone number or street name):  [facility] - [place], [place] - [address]  [address] [place] [address]  Phone: [phone] Fax: [phone]  Hours: Not open 24 hours     Agent: Please be advised that RX refills may take up to 3 business days. We ask that you follow-up with your pharmacy.

## 2019-12-14 ENCOUNTER — Ambulatory Visit: Payer: Medicare Other | Admitting: Internal Medicine

## 2019-12-31 ENCOUNTER — Ambulatory Visit (INDEPENDENT_AMBULATORY_CARE_PROVIDER_SITE_OTHER): Payer: Medicare Other | Admitting: Podiatry

## 2019-12-31 ENCOUNTER — Encounter: Payer: Self-pay | Admitting: Podiatry

## 2019-12-31 ENCOUNTER — Other Ambulatory Visit: Payer: Self-pay

## 2019-12-31 DIAGNOSIS — S90222A Contusion of left lesser toe(s) with damage to nail, initial encounter: Secondary | ICD-10-CM

## 2020-01-04 NOTE — Progress Notes (Signed)
   HPI: 74 y.o. female presenting today for evaluation of discoloration to the left second toenail plate.  Patient denies pain.  She states that she recently noticed that there was some discoloration to the toenail plate and would like to have it evaluated.  She denies any pain or tenderness associated to the area and denies any history of injury to the area.  She has noticed it now for approximately 1 month.  She presents for further treatment and evaluation  Past Medical History:  Diagnosis Date  . Anemia   . Arthritis   . B12 deficiency   . Crohn disease ([place])   . Crohn's disease ([place])   . Depression   . Depression, major, single episode, complete remission ([place]) 10/28/2017  . Diverticulosis   . DJD (degenerative joint disease)   . DJD (degenerative joint disease)   . Dyspnea    with exertion (incline walking)  . Essential hypertension 08/16/2019  . Full dentures   . GERD (gastroesophageal reflux disease)   . Heart palpitations   . Hiatal hernia   . History of hiatal hernia   . Hypercholesterolemia   . Hyperlipidemia   . Incontinence of urine   . Medical history non-contributory   . Nocturnal leg cramps   . Osteopenia   . Osteoporosis   . Pre-diabetes   . Reflux   . Sleep apnea    uses a cpap  . Wears glasses      Physical Exam: General: The patient is alert and oriented x3 in no acute distress.  Dermatology: Skin is warm, dry and supple bilateral lower extremities. Negative for open lesions or macerations.  Dry stable subungual hematoma and bleeding underneath the nail plate noted to the second toe left foot.  Negative for any tenderness to palpation.  This appears very chronic and stable  Vascular: Palpable pedal pulses bilaterally. No edema or erythema noted. Capillary refill within normal limits.  Neurological: Epicritic and protective threshold grossly intact bilaterally.   Musculoskeletal Exam: Range of motion within normal limits to all pedal and ankle joints  bilateral. Muscle strength 5/5 in all groups bilateral.   Assessment: 1.  Subungual hematoma left second digit-chronic stable   Plan of Care:  1. Patient evaluated.  2.  Today I reassured the patient that this is not of concern especially since the nail plate is not painful.  I explained to the patient that the subungual hematoma should grow out with time.  There is a possibility that the nail will fall off however a new nail should grow back in. 3.  Return to clinic as needed      Edrick Kins, DPM [facility]  Dr. Edrick Kins, DPM    [address], [place] [address]                Office [phone]  Fax [phone]

## 2020-02-16 ENCOUNTER — Other Ambulatory Visit: Payer: Self-pay

## 2020-02-16 ENCOUNTER — Encounter: Payer: Self-pay | Admitting: Internal Medicine

## 2020-02-16 ENCOUNTER — Ambulatory Visit (INDEPENDENT_AMBULATORY_CARE_PROVIDER_SITE_OTHER): Payer: Medicare Other | Admitting: Internal Medicine

## 2020-02-16 VITALS — BP 135/78 | HR 56 | Temp 97.8°F | Ht 62.0 in | Wt 181.0 lb

## 2020-02-16 DIAGNOSIS — R42 Dizziness and giddiness: Secondary | ICD-10-CM

## 2020-02-16 DIAGNOSIS — Z23 Encounter for immunization: Secondary | ICD-10-CM

## 2020-02-16 DIAGNOSIS — R7303 Prediabetes: Secondary | ICD-10-CM

## 2020-02-16 DIAGNOSIS — M79671 Pain in right foot: Secondary | ICD-10-CM

## 2020-02-16 DIAGNOSIS — I10 Essential (primary) hypertension: Secondary | ICD-10-CM | POA: Diagnosis not present

## 2020-02-16 DIAGNOSIS — M810 Age-related osteoporosis without current pathological fracture: Secondary | ICD-10-CM

## 2020-02-16 DIAGNOSIS — E782 Mixed hyperlipidemia: Secondary | ICD-10-CM

## 2020-02-16 DIAGNOSIS — M79672 Pain in left foot: Secondary | ICD-10-CM

## 2020-02-16 DIAGNOSIS — G4733 Obstructive sleep apnea (adult) (pediatric): Secondary | ICD-10-CM

## 2020-02-16 DIAGNOSIS — S0033XA Contusion of nose, initial encounter: Secondary | ICD-10-CM

## 2020-02-16 MED ORDER — GABAPENTIN 300 MG PO CAPS: 600.0000 mg | ORAL_CAPSULE | Freq: Two times a day (BID) | ORAL | 1 refills | Status: DC

## 2020-02-16 NOTE — Progress Notes (Signed)
Date:  02/16/2020   Name:  Kimberly Andrade   DOB:  21-Jul-1945   MRN:  [id number]   Chief Complaint: Annual Exam (Breast exam no pap) and Facial Injury (X1 week ago, Sore to touch, ENT appt., Pts dog hit her in the nose will playing with it)  Kimberly Andrade is a 74 y.o. female who presents today for her Complete Annual Exam. She feels poorly. She reports exercising none. She reports she is sleeping poorly. Breast complaints none.  Mammogram: 09/2019 DEXA: 08/2018 @ [facility] - osteoporosis no change from 2018 Pap smear: discontinued Colonoscopy: 06/2019  Immunization History  Administered Date(s) Administered  . Fluad Quad(high Dose 65+) 12/11/2018  . Influenza, High Dose Seasonal PF 11/27/2017  . Influenza, Seasonal, Injecte, Preservative Fre 11/27/2017  . Pneumococcal Conjugate-13 06/29/2015  . Pneumococcal Polysaccharide-23 04/29/2011  . Tdap 07/10/2016  . Zoster 02/01/2014    Hypertension This is a chronic problem. The problem is controlled. Associated symptoms include headaches (X1 day this morning ). Pertinent negatives include no chest pain, palpitations or shortness of breath. Past treatments include angiotensin blockers and diuretics. The current treatment provides significant improvement.  Hyperlipidemia This is a chronic problem. Pertinent negatives include no chest pain or shortness of breath. Current antihyperlipidemic treatment includes statins. The current treatment provides significant improvement of lipids.  Diabetes She presents for her follow-up diabetic visit. Diabetes type: prediabetes. Hypoglycemia symptoms include dizziness (started this am) and headaches (X1 day this morning ). Pertinent negatives for hypoglycemia include no nervousness/anxiousness or tremors. Pertinent negatives for diabetes include no chest pain, no fatigue, no polydipsia and no polyuria.  Osteoporosis - by DEXA, on Prolia, vitamin D and calcium.  Last vitamin D level normal.  Lab  Results  Component Value Date   CREATININE 0.99 12/11/2018   BUN 16 12/11/2018   NA 146 (H) 12/11/2018   K 4.3 12/11/2018   CL 108 (H) 12/11/2018   CO2 23 12/11/2018   Lab Results  Component Value Date   CHOL 185 12/11/2018   HDL 55 12/11/2018   LDLCALC 103 (H) 12/11/2018   TRIG 155 (H) 12/11/2018   CHOLHDL 3.4 12/11/2018   Lab Results  Component Value Date   TSH 2.250 12/11/2018   Lab Results  Component Value Date   HGBA1C 5.8 (H) 12/11/2018   Lab Results  Component Value Date   WBC 6.8 12/11/2018   HGB 13.4 12/11/2018   HCT 40.4 12/11/2018   MCV 89 12/11/2018   PLT 284 12/11/2018   Lab Results  Component Value Date   ALT 15 12/11/2018   AST 18 12/11/2018   ALKPHOS 38 (L) 12/11/2018   BILITOT 0.4 12/11/2018   Last vitamin D Lab Results  Component Value Date   VD25OH 35.7 07/27/2019     Review of Systems  Constitutional: Negative for chills, fatigue and fever.  HENT: Positive for facial swelling (nose pain after dog hit her). Negative for congestion, hearing loss, tinnitus, trouble swallowing and voice change.   Eyes: Negative for visual disturbance.  Respiratory: Negative for cough, chest tightness, shortness of breath and wheezing.   Cardiovascular: Negative for chest pain, palpitations and leg swelling.  Gastrointestinal: Positive for nausea (X1 day since this morning). Negative for abdominal pain, constipation, diarrhea and vomiting.  Endocrine: Negative for polydipsia and polyuria.  Genitourinary: Negative for dysuria, frequency, genital sores, vaginal bleeding and vaginal discharge.  Musculoskeletal: Positive for arthralgias and gait problem (right knee needs replacement). Negative for joint swelling.  Skin:  Negative for color change and rash.  Neurological: Positive for dizziness (started this am) and headaches (X1 day this morning ). Negative for tremors and light-headedness.  Hematological: Negative for adenopathy. Does not bruise/bleed easily.   Psychiatric/Behavioral: Negative for dysphoric mood and sleep disturbance. The patient is not nervous/anxious.     Patient Active Problem List   Diagnosis Date Noted  . 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 Essential hypertension 08/16/2019  . Mild peripheral edema 07/27/2019  . Morton's neuroma 07/27/2019  . Atherosclerosis of abdominal aorta ([place]) 03/16/2018  . DDD (degenerative disc disease), 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/2017  . Prediabetes 12/25/2016  . Nocturnal leg cramps 12/25/2016  . 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 Urinary incontinence in female 10/16/2016  . Mixed hyperlipidemia 10/05/2015  . GERD (gastroesophageal reflux disease) 04/24/2015  . Crohn's disease of small intestine without complication ([place]) 72/53/6644  . Obstructive sleep apnea of adult 11/18/2013    Allergies  Allergen Reactions  . Fosamax [Alendronate Sodium] Other (See Comments)    Bones hurt  . Lipitor [Atorvastatin] Other (See Comments)    Bone pain  . Codeine Other (See Comments)     Codeine in the liquid form causes gastritis  . Hydrocodone Nausea And Vomiting    Past Surgical History:  Procedure Laterality Date  . ABDOMINAL HYSTERECTOMY    . APPENDECTOMY    . CARPEL TUNNEL    . CHOLECYSTECTOMY    . COLONOSCOPY WITH PROPOFOL N/A 03/11/2016   Procedure: COLONOSCOPY WITH PROPOFOL;  Surgeon: Lollie Sails, MD;  Location[facility] ENDOSCOPY;  Service: Endoscopy;  Laterality: N/A;  . DILATION AND CURETTAGE OF UTERUS    . ESOPHAGOGASTRODUODENOSCOPY N/A 03/11/2016   Procedure: ESOPHAGOGASTRODUODENOSCOPY (EGD);  Surgeon: Lollie Sails, MD;  Location[facility]COPY;  Service: Endoscopy;  Laterality: N/A;  . ESOPHAGOGASTRODUODENOSCOPY (EGD) WITH PROPOFOL N/A 11/11/2017   Procedure: ESOPHAGOGASTRODUODENOSCOPY (EGD) WITH PROPOFOL;  Surgeon: Lollie Sails, MD;  Location: [facility] ENDOSCOPY;  Service: Endoscopy;  Laterality: N/A;  . ESOPHAGOGASTRODUODENOSCOPY (EGD) WITH PROPOFOL N/A 01/20/2018    Procedure: ESOPHAGOGASTRODUODENOSCOPY (EGD) WITH PROPOFOL;  Surgeon: Lollie Sails, MD;  Location: [facility] ENDOSCOPY;  Service: Endoscopy;  Laterality: N/A;  . FOOT SURGERY Right   . HAMMER TOE SURGERY    . JOINT REPLACEMENT Right 2006   shoulder  . KNEE ARTHROSCOPY Right 05/25/2013   Procedure: ARTHROSCOPY KNEE;  Surgeon: Hessie Dibble, MD;  Location: [facility];  Service: Orthopedics;  Laterality: Right;  medial and lateral meniscal debridment and chondroplasty  . ROBOTIC ASSISTED LAPAROSCOPIC REPAIR OF PARAESOPHAGEAL HERNIA  07/16/2018  . SHOULDER ARTHROSCOPY WITH ROTATOR CUFF REPAIR AND SUBACROMIAL DECOMPRESSION Left 08/14/2017   Procedure: SHOULDER ARTHROSCOPY WITH ROTATOR CUFF REPAIR AND SUBACROMIAL DECOMPRESSION;  Surgeon: Tania Ade, MD;  Location: [facility];  Service: Orthopedics;  Laterality: Left;  . SHOULDER SURGERY Right    X 3  . TOTAL HIP ARTHROPLASTY Left 11/25/2017   Procedure: LEFT TOTAL HIP ARTHROPLASTY ANTERIOR APPROACH;  Surgeon: Melrose Nakayama, MD;  Location: [facility];  Service: Orthopedics;  Laterality: Left;    Social History   Tobacco Use  . Smoking status: Former Smoker    Quit date: 12/29/2001    Years since quitting: 18.1  . Smokeless tobacco: Never Used  Vaping Use  . Vaping Use: Never used  Substance Use Topics  . Alcohol use: No  . Drug use: No     Medication list has been reviewed and updated.  Current Meds  Medication Sig  . Ascorbic Acid (VITAMIN C PO)  Take by mouth.  Marland Kitchen aspirin EC 81 MG tablet Take 81 mg by mouth daily.  . Calcium Carbonate-Vit D-Min (CALCIUM 600+D3 PLUS MINERALS) 600-800 MG-UNIT TABS Take 1 tablet by mouth daily.  . Cholecalciferol (VITAMIN D3 PO) Take by mouth.  . gabapentin (NEURONTIN) 300 MG capsule Take 2 capsules (600 mg total) by mouth 2 (two) times daily.  . hydrochlorothiazide (HYDRODIURIL) 25 MG tablet Take 1 tablet (25 mg total) by mouth daily.  Marland Kitchen losartan (COZAAR) 50 MG tablet Take 1 tablet (50 mg  total) by mouth daily.  . Magnesium 250 MG TABS Take 250 mg by mouth daily.   . mesalamine (PENTASA) 500 MG CR capsule Take 2,000 mg by mouth 2 (two) times daily.   . Multiple Vitamins-Minerals (MULTIVITAMIN WITH MINERALS) tablet Take 1 tablet by mouth daily.  . Multiple Vitamins-Minerals (ZINC PO) Take by mouth.  . naproxen sodium (ALEVE) 220 MG tablet Take by mouth.  . NON FORMULARY BiPap nightly  . oxybutynin (DITROPAN XL) 15 MG 24 hr tablet Take 1 tablet (15 mg total) by mouth at bedtime.  . Potassium 99 MG TABS Take 99 mg by mouth daily.   . pravastatin (PRAVACHOL) 20 MG tablet Take 1 tablet (20 mg total) by mouth at bedtime. (Patient taking differently: Take 40 mg by mouth at bedtime.)  . PROLIA 60 MG/ML SOSY injection every 6 (six) months.  . triamcinolone (NASACORT) 55 MCG/ACT AERO nasal inhaler Place into the nose.  . vitamin B-12 (CYANOCOBALAMIN) 1000 MCG tablet Take 1,000 mcg by mouth daily.    PHQ 2/9 Scores 02/16/2020 08/16/2019 07/27/2019 07/14/2019  PHQ - 2 Score 0 1 0 2  PHQ- 9 Score 3 3 0 6    GAD 7 : Generalized Anxiety Score 02/16/2020 08/16/2019 07/27/2019  Nervous, Anxious, on Edge 0 1 0  Control/stop worrying 0 1 0  Worry too much - different things 0 1 0  Trouble relaxing 0 0 0  Restless 0 0 0  Easily annoyed or irritable 0 0 0  Afraid - awful might happen 0 0 0  Total GAD 7 Score 0 3 0  Anxiety Difficulty - Not difficult at all Not difficult at all    BP Readings from Last 3 Encounters:  02/16/20 135/78  11/11/19 138/80  10/22/19 (!) 112/58    Physical Exam Vitals and nursing note reviewed.  Constitutional:      General: She is not in acute distress (from vertigo).    Appearance: She is well-developed.  HENT:     Head: Normocephalic and atraumatic.     Right Ear: Tympanic membrane and ear canal normal.     Left Ear: Tympanic membrane and ear canal normal.     Nose: Nasal tenderness present.     Right Nostril: No foreign body or epistaxis.     Left  Nostril: No foreign body or epistaxis.     Right Sinus: No maxillary sinus tenderness.     Left Sinus: No maxillary sinus tenderness.  Eyes:     General: Lids are normal. No scleral icterus.       Right eye: No discharge.        Left eye: No discharge.     Extraocular Movements:     Right eye: Nystagmus present.     Left eye: Nystagmus present.     Conjunctiva/sclera: Conjunctivae normal.  Neck:     Thyroid: No thyromegaly.     Vascular: No carotid bruit.  Cardiovascular:     Rate and  Rhythm: Normal rate and regular rhythm.     Pulses: Normal pulses.     Heart sounds: Normal heart sounds.  Pulmonary:     Effort: Pulmonary effort is normal. No respiratory distress.     Breath sounds: No wheezing.  Chest:  Breasts:     Right: No mass, nipple discharge, skin change or tenderness.     Left: No mass, nipple discharge, skin change or tenderness.    Abdominal:     General: Bowel sounds are normal.     Palpations: Abdomen is soft.     Tenderness: There is abdominal tenderness in the epigastric area. There is no guarding or rebound.  Musculoskeletal:        General: Normal range of motion.     Cervical back: Normal range of motion. No erythema.     Right lower leg: No edema.     Left lower leg: No edema.  Lymphadenopathy:     Cervical: No cervical adenopathy.  Skin:    General: Skin is warm and dry.     Findings: No rash.  Neurological:     Mental Status: She is alert and oriented to person, place, and time.     Cranial Nerves: No cranial nerve deficit.     Sensory: No sensory deficit.     Deep Tendon Reflexes: Reflexes are normal and symmetric.  Psychiatric:        Attention and Perception: Attention normal.        Mood and Affect: Mood normal.     Wt Readings from Last 3 Encounters:  02/16/20 181 lb (82.1 kg)  11/11/19 176 lb (79.8 kg)  10/22/19 168 lb (76.2 kg)    BP 135/78   Pulse (!) 56   Temp 97.8 F (36.6 C) (Oral)   Ht 5' 2"  (1.575 m)   Wt 181 lb (82.1  kg)   SpO2 95%   BMI 33.11 kg/m   Assessment and Plan: 1. Essential hypertension Clinically stable exam with well controlled BP. Tolerating medications without side effects at this time. Pt to continue current regimen and low sodium diet; benefits of regular exercise as able discussed. - CBC with Differential/Platelet - Comprehensive metabolic panel - TSH  2. Prediabetes Check labs and advise Continue low carb diet and weight loss efforts - Hemoglobin A1c  3. Mixed hyperlipidemia On Pravachol without side effects LDL goal < 100 - Lipid panel  4. Postmenopausal osteoporosis On Prolia, vitamin D and calcium  5. Obstructive sleep apnea of adult On CPAP; followed by Pulmonary Excellent compliance with restful sleep  6. Foot pain, bilateral - gabapentin (NEURONTIN) 300 MG capsule; Take 2 capsules (600 mg total) by mouth 2 (two) times daily.  Dispense: 360 capsule; Refill: 1  7. Need for immunization against influenza - Flu Vaccine QUAD High Dose(Fluad)  8. Vertigo Acute sx started today - may be due to recent facial trauma Recommend Dramamine PRN Seeing ENT tomorrow re: nasal injury and can discuss then  9. Contusion of nose, initial encounter Consult ENT as planned   Partially dictated using Dragon software. Any errors are unintentional.  Halina Maidens, MD [facility]  02/16/2020

## 2020-02-17 LAB — COMPREHENSIVE METABOLIC PANEL
ALT: 29 IU/L (ref 0–32)
AST: 28 IU/L (ref 0–40)
Albumin/Globulin Ratio: 2.2 (ref 1.2–2.2)
Albumin: 4.2 g/dL (ref 3.7–4.7)
Alkaline Phosphatase: 39 IU/L — ABNORMAL LOW (ref 44–121)
BUN/Creatinine Ratio: 18 (ref 12–28)
BUN: 16 mg/dL (ref 8–27)
Bilirubin Total: 0.6 mg/dL (ref 0.0–1.2)
CO2: 22 mmol/L (ref 20–29)
Calcium: 9.1 mg/dL (ref 8.7–10.3)
Chloride: 105 mmol/L (ref 96–106)
Creatinine, Ser: 0.87 mg/dL (ref 0.57–1.00)
GFR calc Af Amer: 76 mL/min/{1.73_m2} (ref >59)
GFR calc non Af Amer: 66 mL/min/{1.73_m2} (ref >59)
Globulin, Total: 1.9 g/dL (ref 1.5–4.5)
Glucose: 107 mg/dL — ABNORMAL HIGH (ref 65–99)
Potassium: 4.5 mmol/L (ref 3.5–5.2)
Sodium: 142 mmol/L (ref 134–144)
Total Protein: 6.1 g/dL (ref 6.0–8.5)

## 2020-02-17 LAB — CBC WITH DIFFERENTIAL/PLATELET
Basophils Absolute: 0.1 10*3/uL (ref 0.0–0.2)
Basos: 1 %
EOS (ABSOLUTE): 0.3 10*3/uL (ref 0.0–0.4)
Eos: 4 %
Hematocrit: 39.6 % (ref 34.0–46.6)
Hemoglobin: 13.1 g/dL (ref 11.1–15.9)
Immature Grans (Abs): 0.1 10*3/uL (ref 0.0–0.1)
Immature Granulocytes: 1 %
Lymphocytes Absolute: 1.5 10*3/uL (ref 0.7–3.1)
Lymphs: 22 %
MCH: 28.9 pg (ref 26.6–33.0)
MCHC: 33.1 g/dL (ref 31.5–35.7)
MCV: 87 fL (ref 79–97)
Monocytes Absolute: 0.6 10*3/uL (ref 0.1–0.9)
Monocytes: 9 %
Neutrophils Absolute: 4 10*3/uL (ref 1.4–7.0)
Neutrophils: 63 %
Platelets: 237 10*3/uL (ref 150–450)
RBC: 4.53 x10E6/uL (ref 3.77–5.28)
RDW: 13 % (ref 11.7–15.4)
WBC: 6.5 10*3/uL (ref 3.4–10.8)

## 2020-02-17 LAB — LIPID PANEL
Chol/HDL Ratio: 2.1 ratio (ref 0.0–4.4)
Cholesterol, Total: 160 mg/dL (ref 100–199)
HDL: 75 mg/dL (ref >39)
LDL Chol Calc (NIH): 64 mg/dL (ref 0–99)
Triglycerides: 121 mg/dL (ref 0–149)
VLDL Cholesterol Cal: 21 mg/dL (ref 5–40)

## 2020-02-17 LAB — TSH: TSH: 2.56 u[IU]/mL (ref 0.450–4.500)

## 2020-02-17 LAB — HEMOGLOBIN A1C
Est. average glucose Bld gHb Est-mCnc: 134 mg/dL
Hgb A1c MFr Bld: 6.3 % — ABNORMAL HIGH (ref 4.8–5.6)

## 2020-02-18 ENCOUNTER — Encounter: Payer: Self-pay | Admitting: Internal Medicine

## 2020-03-13 ENCOUNTER — Ambulatory Visit: Payer: Medicare Other

## 2020-03-24 ENCOUNTER — Other Ambulatory Visit: Payer: Self-pay | Admitting: Orthopaedic Surgery

## 2020-03-24 DIAGNOSIS — Z01818 Encounter for other preprocedural examination: Secondary | ICD-10-CM

## 2020-03-27 ENCOUNTER — Other Ambulatory Visit: Payer: Self-pay | Admitting: Orthopaedic Surgery

## 2020-03-28 ENCOUNTER — Encounter: Payer: Self-pay | Admitting: Internal Medicine

## 2020-03-28 ENCOUNTER — Other Ambulatory Visit: Payer: Self-pay

## 2020-03-28 ENCOUNTER — Other Ambulatory Visit: Payer: Self-pay | Admitting: Internal Medicine

## 2020-03-28 DIAGNOSIS — R32 Unspecified urinary incontinence: Secondary | ICD-10-CM

## 2020-03-28 MED ORDER — OXYBUTYNIN CHLORIDE ER 15 MG PO TB24: 15.0000 mg | ORAL_TABLET | Freq: Every day | ORAL | 0 refills | Status: DC

## 2020-03-28 NOTE — Telephone Encounter (Unsigned)
Copied from [place] [id number]. Topic: Quick Communication - Rx Refill/Question >> Mar 28, 2020  9:25 AM Yvette Rack wrote: Medication: oxybutynin (DITROPAN XL) 15 MG 24 hr tablet  Has the patient contacted their pharmacy? no  Preferred Pharmacy (with phone number or street name): [facility] [address]  Phone: [phone]   Fax: [phone]  Agent: Please be advised that RX refills may take up to 3 business days. We ask that you follow-up with your pharmacy.

## 2020-03-29 ENCOUNTER — Telehealth: Payer: Self-pay

## 2020-03-29 ENCOUNTER — Telehealth: Payer: Self-pay | Admitting: Internal Medicine

## 2020-03-29 NOTE — Telephone Encounter (Signed)
Wells Guiles, from [facility], calling stating that they faxed over a surgical clearance form on 03/24/20. She states that they are still waiting on a response. Please advise.     [phone]

## 2020-03-29 NOTE — Telephone Encounter (Signed)
Appointment scheduled.

## 2020-03-29 NOTE — Telephone Encounter (Signed)
Called pt to schedule a medical clearance appt. Pt surgery date is 04/18/2020.  Will route result note to [facility] Nurse Triage for follow up when patient returns call to clinic. Nurse may give results to patient if they return call. CRM created for this message.   KP

## 2020-03-29 NOTE — Telephone Encounter (Signed)
Called left VM to call back. Pt needs a medical clearance and has a surgery date of 04/18/2020.  Will route result note to [facility] Nurse Triage for follow up when patient returns call to clinic. Nurse may give results to patient if they return call. CRM created for this message.   KP

## 2020-04-03 ENCOUNTER — Ambulatory Visit (INDEPENDENT_AMBULATORY_CARE_PROVIDER_SITE_OTHER): Payer: Medicare Other

## 2020-04-03 DIAGNOSIS — Z0000 Encounter for general adult medical examination without abnormal findings: Secondary | ICD-10-CM | POA: Diagnosis not present

## 2020-04-03 NOTE — Patient Instructions (Signed)
Kimberly Andrade , Thank you for taking time to come for your Medicare Wellness Visit. I appreciate your ongoing commitment to your health goals. Please review the following plan we discussed and let me know if I can assist you in the future.   Screening recommendations/referrals: Colonoscopy: done 06/15/19. Repeat in 2024 Mammogram: done 09/09/19 Bone Density: done 08/24/18 Recommended yearly ophthalmology/optometry visit for glaucoma screening and checkup Recommended yearly dental visit for hygiene and checkup  Vaccinations: Influenza vaccine: done 02/16/20 Pneumococcal vaccine: done 06/29/15 Tdap vaccine: done 07/10/16 Shingles vaccine: Shingrix discussed. Please contact your pharmacy for coverage information.  Covid-19: declined  Advanced directives: Please bring a copy of your health care power of attorney and living will to the office at your convenience once you have completed that paperwork  Conditions/risks identified: Recommend increasing physical activity as tolerated after knee surgery.   Next appointment: Follow up in one year for your annual wellness visit    Preventive Care 65 Years and Older, Female Preventive care refers to lifestyle choices and visits with your health care provider that can promote health and wellness. What does preventive care include?  A yearly physical exam. This is also called an annual well check.  Dental exams once or twice a year.  Routine eye exams. Ask your health care provider how often you should have your eyes checked.  Personal lifestyle choices, including:  Daily care of your teeth and gums.  Regular physical activity.  Eating a healthy diet.  Avoiding tobacco and drug use.  Limiting alcohol use.  Practicing safe sex.  Taking low-dose aspirin every day.  Taking vitamin and mineral supplements as recommended by your health care provider. What happens during an annual well check? The services and screenings done by your  health care provider during your annual well check will depend on your age, overall health, lifestyle risk factors, and family history of disease. Counseling  Your health care provider may ask you questions about your:  Alcohol use.  Tobacco use.  Drug use.  Emotional well-being.  Home and relationship well-being.  Sexual activity.  Eating habits.  History of falls.  Memory and ability to understand (cognition).  Work and work Statistician.  Reproductive health. Screening  You may have the following tests or measurements:  Height, weight, and BMI.  Blood pressure.  Lipid and cholesterol levels. These may be checked every 5 years, or more frequently if you are over 23 years old.  Skin check.  Lung cancer screening. You may have this screening every year starting at age 4 if you have a 30-pack-year history of smoking and currently smoke or have quit within the past 15 years.  Fecal occult blood test (FOBT) of the stool. You may have this test every year starting at age 41.  Flexible sigmoidoscopy or colonoscopy. You may have a sigmoidoscopy every 5 years or a colonoscopy every 10 years starting at age 61.  Hepatitis C blood test.  Hepatitis B blood test.  Sexually transmitted disease (STD) testing.  Diabetes screening. This is done by checking your blood sugar (glucose) after you have not eaten for a while (fasting). You may have this done every 1-3 years.  Bone density scan. This is done to screen for osteoporosis. You may have this done starting at age 21.  Mammogram. This may be done every 1-2 years. Talk to your health care provider about how often you should have regular mammograms. Talk with your health care provider about your test results, treatment options, and  if necessary, the need for more tests. Vaccines  Your health care provider may recommend certain vaccines, such as:  Influenza vaccine. This is recommended every year.  Tetanus, diphtheria, and  acellular pertussis (Tdap, Td) vaccine. You may need a Td booster every 10 years.  Zoster vaccine. You may need this after age 75.  Pneumococcal 13-valent conjugate (PCV13) vaccine. One dose is recommended after age 49.  Pneumococcal polysaccharide (PPSV23) vaccine. One dose is recommended after age 30. Talk to your health care provider about which screenings and vaccines you need and how often you need them. This information is not intended to replace advice given to you by your health care provider. Make sure you discuss any questions you have with your health care provider. Document Released: 03/17/2015 Document Revised: 11/08/2015 Document Reviewed: 12/20/2014 Elsevier Interactive Patient Education  2017 [place] Prevention in the Home Falls can cause injuries. They can happen to people of all ages. There are many things you can do to make your home safe and to help prevent falls. What can I do on the outside of my home?  Regularly fix the edges of walkways and driveways and fix any cracks.  Remove anything that might make you trip as you walk through a door, such as a raised step or threshold.  Trim any bushes or trees on the path to your home.  Use bright outdoor lighting.  Clear any walking paths of anything that might make someone trip, such as rocks or tools.  Regularly check to see if handrails are loose or broken. Make sure that both sides of any steps have handrails.  Any raised decks and porches should have guardrails on the edges.  Have any leaves, snow, or ice cleared regularly.  Use sand or salt on walking paths during winter.  Clean up any spills in your garage right away. This includes oil or grease spills. What can I do in the bathroom?  Use night lights.  Install grab bars by the toilet and in the tub and shower. Do not use towel bars as grab bars.  Use non-skid mats or decals in the tub or shower.  If you need to sit down in the shower, use  a plastic, non-slip stool.  Keep the floor dry. Clean up any water that spills on the floor as soon as it happens.  Remove soap buildup in the tub or shower regularly.  Attach bath mats securely with double-sided non-slip rug tape.  Do not have throw rugs and other things on the floor that can make you trip. What can I do in the bedroom?  Use night lights.  Make sure that you have a light by your bed that is easy to reach.  Do not use any sheets or blankets that are too big for your bed. They should not hang down onto the floor.  Have a firm chair that has side arms. You can use this for support while you get dressed.  Do not have throw rugs and other things on the floor that can make you trip. What can I do in the kitchen?  Clean up any spills right away.  Avoid walking on wet floors.  Keep items that you use a lot in easy-to-reach places.  If you need to reach something above you, use a strong step stool that has a grab bar.  Keep electrical cords out of the way.  Do not use floor polish or wax that makes floors slippery. If you  must use wax, use non-skid floor wax.  Do not have throw rugs and other things on the floor that can make you trip. What can I do with my stairs?  Do not leave any items on the stairs.  Make sure that there are handrails on both sides of the stairs and use them. Fix handrails that are broken or loose. Make sure that handrails are as long as the stairways.  Check any carpeting to make sure that it is firmly attached to the stairs. Fix any carpet that is loose or worn.  Avoid having throw rugs at the top or bottom of the stairs. If you do have throw rugs, attach them to the floor with carpet tape.  Make sure that you have a light switch at the top of the stairs and the bottom of the stairs. If you do not have them, ask someone to add them for you. What else can I do to help prevent falls?  Wear shoes that:  Do not have high heels.  Have  rubber bottoms.  Are comfortable and fit you well.  Are closed at the toe. Do not wear sandals.  If you use a stepladder:  Make sure that it is fully opened. Do not climb a closed stepladder.  Make sure that both sides of the stepladder are locked into place.  Ask someone to hold it for you, if possible.  Clearly mark and make sure that you can see:  Any grab bars or handrails.  First and last steps.  Where the edge of each step is.  Use tools that help you move around (mobility aids) if they are needed. These include:  Canes.  Walkers.  Scooters.  Crutches.  Turn on the lights when you go into a dark area. Replace any light bulbs as soon as they burn out.  Set up your furniture so you have a clear path. Avoid moving your furniture around.  If any of your floors are uneven, fix them.  If there are any pets around you, be aware of where they are.  Review your medicines with your doctor. Some medicines can make you feel dizzy. This can increase your chance of falling. Ask your doctor what other things that you can do to help prevent falls. This information is not intended to replace advice given to you by your health care provider. Make sure you discuss any questions you have with your health care provider. Document Released: 12/15/2008 Document Revised: 07/27/2015 Document Reviewed: 03/25/2014 Elsevier Interactive Patient Education  2017 [employer].

## 2020-04-03 NOTE — Progress Notes (Signed)
Subjective:   Kimberly Andrade is a 75 y.o. female who presents for Medicare Annual (Subsequent) preventive examination.  Virtual Visit via Telephone Note  I connected with  Kimberly Andrade on 04/03/20 at 10:00 AM EST by telephone and verified that I am speaking with the correct person using two identifiers.  Location: Patient: home Provider: [facility] Persons participating in the virtual visit: [place]   I discussed the limitations, risks, security and privacy concerns of performing an evaluation and management service by telephone and the availability of in person appointments. The patient expressed understanding and agreed to proceed.  Interactive audio and video telecommunications were attempted between this nurse and patient, however failed, due to patient having technical difficulties OR patient did not have access to video capability.  We continued and completed visit with audio only.  Some vital signs may be absent or patient reported.   Kimberly Marker, LPN    Review of Systems     Cardiac Risk Factors include: advanced age (>108mn, >>62women);hypertension;obesity (BMI >30kg/m2)     Objective:    Today's Vitals   04/03/20 1007  PainSc: 2    There is no height or weight on file to calculate BMI.  Advanced Directives 04/03/2020 03/10/2019 01/20/2018 11/25/2017 11/14/2017 11/11/2017 08/08/2017  Does Patient Have a Medical Advance Directive? No No No No No No No  Would patient like information on creating a medical advance directive? No - Patient declined Yes (MAU/Ambulatory/Procedural Areas - Information given) No - Patient declined No - Patient declined Yes (MAU/Ambulatory/Procedural Areas - Information given) - Yes (MAU/Ambulatory/Procedural Areas - Information given)    Current Medications (verified) Outpatient Encounter Medications as of 04/03/2020  Medication Sig  . Ascorbic Acid (VITAMIN C PO) Take by mouth.  .Marland Kitchenaspirin EC 81 MG tablet Take 81  mg by mouth daily.  . Calcium Carbonate-Vit D-Min (CALCIUM 600+D3 PLUS MINERALS) 600-800 MG-UNIT TABS Take 1 tablet by mouth daily.  . Cholecalciferol (VITAMIN D3 PO) Take by mouth.  . gabapentin (NEURONTIN) 300 MG capsule Take 2 capsules (600 mg total) by mouth 2 (two) times daily.  . hydrochlorothiazide (HYDRODIURIL) 25 MG tablet Take 1 tablet (25 mg total) by mouth daily.  .Marland Kitchenlosartan (COZAAR) 50 MG tablet Take 1 tablet (50 mg total) by mouth daily.  . Magnesium 250 MG TABS Take 250 mg by mouth daily.   . mesalamine (PENTASA) 500 MG CR capsule Take 2,000 mg by mouth 2 (two) times daily.   . Multiple Vitamins-Minerals (MULTIVITAMIN WITH MINERALS) tablet Take 1 tablet by mouth daily.  . naproxen sodium (ALEVE) 220 MG tablet Take by mouth.  . NON FORMULARY BiPap nightly  . oxybutynin (DITROPAN XL) 15 MG 24 hr tablet Take 1 tablet (15 mg total) by mouth at bedtime.  . Potassium 99 MG TABS Take 99 mg by mouth daily.   . pravastatin (PRAVACHOL) 20 MG tablet Take 1 tablet (20 mg total) by mouth at bedtime. (Patient taking differently: Take 40 mg by mouth at bedtime.)  . PROLIA 60 MG/ML SOSY injection every 6 (six) months.  . vitamin B-12 (CYANOCOBALAMIN) 1000 MCG tablet Take 1,000 mcg by mouth daily.  .Marland Kitchenzinc gluconate 50 MG tablet Take 50 mg by mouth daily.  . [DISCONTINUED] fexofenadine (ALLEGRA) 180 MG tablet Take by mouth. (Patient not taking: Reported on 02/16/2020)  . [DISCONTINUED] Multiple Vitamins-Minerals (ZINC PO) Take by mouth.  . [DISCONTINUED] triamcinolone (NASACORT) 55 MCG/ACT AERO nasal inhaler Place into the nose.   No facility-administered  encounter medications on file as of 04/03/2020.    Allergies (verified) Fosamax [alendronate sodium], Lipitor [atorvastatin], Codeine, and Hydrocodone   History: Past Medical History:  Diagnosis Date  . Anemia   . Arthritis   . B12 deficiency   . Crohn disease ([place])   . Crohn's disease ([place])   . Depression   . Depression, major,  single episode, complete remission ([place]) 10/28/2017  . Diverticulosis   . DJD (degenerative joint disease)   . DJD (degenerative joint disease)   . Dyspnea    with exertion (incline walking)  . Essential hypertension 08/16/2019  . Full dentures   . GERD (gastroesophageal reflux disease)   . Heart palpitations   . Hiatal hernia   . History of hiatal hernia   . Hypercholesterolemia   . Hyperlipidemia   . Incontinence of urine   . Medical history non-contributory   . Nocturnal leg cramps   . Osteopenia   . Osteoporosis   . Pre-diabetes   . Reflux   . Sleep apnea    uses a cpap  . Wears glasses    Past Surgical History:  Procedure Laterality Date  . ABDOMINAL HYSTERECTOMY    . APPENDECTOMY    . CARPEL TUNNEL    . CHOLECYSTECTOMY    . COLONOSCOPY WITH PROPOFOL N/A 03/11/2016   Procedure: COLONOSCOPY WITH PROPOFOL;  Surgeon: Lollie Sails, MD;  Location: [facility] ENDOSCOPY;  Service: Endoscopy;  Laterality: N/A;  . DILATION AND CURETTAGE OF UTERUS    . ESOPHAGOGASTRODUODENOSCOPY N/A 03/11/2016   Procedure: ESOPHAGOGASTRODUODENOSCOPY (EGD);  Surgeon: Lollie Sails, MD;  Location: [facility] ENDOSCOPY;  Service: Endoscopy;  Laterality: N/A;  . ESOPHAGOGASTRODUODENOSCOPY (EGD) WITH PROPOFOL N/A 11/11/2017   Procedure: ESOPHAGOGASTRODUODENOSCOPY (EGD) WITH PROPOFOL;  Surgeon: Lollie Sails, MD;  Location: [facility] ENDOSCOPY;  Service: Endoscopy;  Laterality: N/A;  . ESOPHAGOGASTRODUODENOSCOPY (EGD) WITH PROPOFOL N/A 01/20/2018   Procedure: ESOPHAGOGASTRODUODENOSCOPY (EGD) WITH PROPOFOL;  Surgeon: Lollie Sails, MD;  Location: [facility] ENDOSCOPY;  Service: Endoscopy;  Laterality: N/A;  . FOOT SURGERY Right   . HAMMER TOE SURGERY    . JOINT REPLACEMENT Right 2006   shoulder  . KNEE ARTHROSCOPY Right 05/25/2013   Procedure: ARTHROSCOPY KNEE;  Surgeon: Hessie Dibble, MD;  Location: [place];  Service: Orthopedics;  Laterality: Right;  medial and lateral meniscal debridment  and chondroplasty  . ROBOTIC ASSISTED LAPAROSCOPIC REPAIR OF PARAESOPHAGEAL HERNIA  07/16/2018  . SHOULDER ARTHROSCOPY WITH ROTATOR CUFF REPAIR AND SUBACROMIAL DECOMPRESSION Left 08/14/2017   Procedure: SHOULDER ARTHROSCOPY WITH ROTATOR CUFF REPAIR AND SUBACROMIAL DECOMPRESSION;  Surgeon: Tania Ade, MD;  Location: [place];  Service: Orthopedics;  Laterality: Left;  . SHOULDER SURGERY Right    X 3  . TOTAL HIP ARTHROPLASTY Left 11/25/2017   Procedure: LEFT TOTAL HIP ARTHROPLASTY ANTERIOR APPROACH;  Surgeon: Melrose Nakayama, MD;  Location: [place];  Service: Orthopedics;  Laterality: Left;   Family History  Problem Relation Age of Onset  . Heart disease Mother   . Esophageal cancer Father    Social History   Socioeconomic History  . Marital status: Married    Spouse name: Not on file  . Number of children: 3  . Years of education: Not on file  . Highest education level: Associate degree: academic program  Occupational History  . Occupation: retired  Tobacco Use  . Smoking status: Former Smoker    Quit date: 12/29/2001    Years since quitting: 18.2  . Smokeless tobacco: Never Used  Vaping  Use  . Vaping Use: Never used  Substance and Sexual Activity  . Alcohol use: No  . Drug use: No  . Sexual activity: Not on file  Other Topics Concern  . Not on file  Social History Narrative  . Not on file   Social Determinants of Health   Financial Resource Strain: Low Risk   . Difficulty of Paying Living Expenses: Not hard at all  Food Insecurity: No Food Insecurity  . Worried About Charity fundraiser in the Last Year: Never true  . Ran Out of Food in the Last Year: Never true  Transportation Needs: No Transportation Needs  . Lack of Transportation (Medical): No  . Lack of Transportation (Non-Medical): No  Physical Activity: Inactive  . Days of Exercise per Week: 0 days  . Minutes of Exercise per Session: 0 min  Stress: No Stress Concern Present  . Feeling of Stress : Not at  all  Social Connections: Moderately Integrated  . Frequency of Communication with Friends and Family: More than three times a week  . Frequency of Social Gatherings with Friends and Family: More than three times a week  . Attends Religious Services: More than 4 times per year  . Active Member of Clubs or Organizations: No  . Attends Archivist Meetings: Never  . Marital Status: Married    Tobacco Counseling Counseling given: Not Answered   Clinical Intake:  Pre-visit preparation completed: Yes  Pain : 0-10 Pain Score: 2  Pain Type: Chronic pain Pain Location: Knee Pain Orientation: Right Pain Descriptors / Indicators: Aching,Sore Pain Onset: More than a month ago Pain Frequency: Constant     Nutritional Risks: None Diabetes: No  How often do you need to have someone help you when you read instructions, pamphlets, or other written materials from your doctor or pharmacy?: 1 - Never   Interpreter Needed?: No  Information entered by :: Kimberly Marker LPN   Activities of Daily Living In your present state of health, do you have any difficulty performing the following activities: 04/03/2020  Hearing? Y  Comment interested in hearing aids  Vision? N  Difficulty concentrating or making decisions? N  Walking or climbing stairs? Y  Dressing or bathing? N  Doing errands, shopping? N  Preparing Food and eating ? N  Using the Toilet? N  In the past six months, have you accidently leaked urine? Y  Comment wears pads for protection  Do you have problems with loss of bowel control? N  Managing your Medications? N  Managing your Finances? N  Housekeeping or managing your Housekeeping? N  Some recent data might be hidden    Patient Care Team: Glean Hess, MD as PCP - General (Internal Medicine) Corey Skains, MD as Consulting Physician (Cardiology) Erby Pian, MD as Referring Physician (Pulmonary Disease) Gabriel Carina Betsey Holiday, MD as Physician Assistant  (Endocrinology) Reeves Forth (Gastroenterology) Melrose Nakayama, MD as Consulting Physician (Orthopedic Surgery)  Indicate any recent Medical Services you may have received from other than [facility] providers in the past year (date may be approximate).     Assessment:   This is a routine wellness examination for Jasline.  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   Vision Screening Comments: [facility] screen[facility]  Dietary issues and exercise activities discussed: Current Exercise Habits: The patient does not participate in regular exercise at present, Exercise limited by: orthopedic condition(s)  Goals    . Weight (lb) < 150 lb (68 kg)     Pt states she would like to lose weight over the next year with diet and exercise      Depression Screen PHQ 2/9 Scores 04/03/2020 02/16/2020 08/16/2019 07/27/2019 07/14/2019 03/17/2019 03/10/2019  PHQ - 2 Score 0 0 1 0 2 0 0  PHQ- 9 Score - 3 3 0 6 - -    Fall Risk Fall Risk  04/03/2020 02/16/2020 08/16/2019 07/27/2019 07/14/2019  Falls in the past year? 0 0 0 0 0  Number falls in past yr: 0 - 0 0 0  Injury with Fall? 0 - 0 0 0  Risk for fall due to : Orthopedic patient - No Fall Risks No Fall Risks No Fall Risks  Follow up Falls prevention discussed Falls evaluation completed Falls evaluation completed Falls evaluation completed Falls evaluation completed    [place]:  Any stairs in or around the home? Yes  If so, are there any without handrails? No  Home free of loose throw rugs in walkways, pet beds, electrical cords, etc? Yes  Adequate lighting in your home to reduce risk of falls? Yes   ASSISTIVE DEVICES UTILIZED TO PREVENT FALLS:  Life  alert? No  Use of a cane, walker or w/c? Yes  Grab bars in the bathroom? No  Shower chair or bench in shower? No  Elevated toilet seat or a handicapped toilet? Yes   TIMED UP AND GO:  Was the test performed? No . Telephonic visit.   Cognitive Function: Normal cognitive status assessed by direct observation by this Nurse Health Advisor. No abnormalities found.       6CIT Screen 03/10/2019  What Year? 0 points  What month? 0 points  What time? 0 points  Count back from 20 0 points  Months in reverse 0 points  Repeat phrase 0 points  Total Score 0    Immunizations Immunization History  Administered Date(s) Administered  . Fluad Quad(high Dose 65+) 12/11/2018, 02/16/2020  . Influenza, High Dose Seasonal PF 11/27/2017  . Influenza, Seasonal, Injecte, Preservative Fre 11/27/2017  . Pneumococcal Conjugate-13 06/29/2015  . Pneumococcal Polysaccharide-23 04/29/2011  . Tdap 07/10/2016  . Zoster 02/01/2014    TDAP status: Up to date  Flu Vaccine status: Up to date  Pneumococcal vaccine status: Up to date  Covid-19 vaccine status: Declined, Education has been provided regarding the importance of this vaccine but patient still declined. Advised may receive this vaccine at local pharmacy or Health Dept.or vaccine clinic. Aware to provide a copy of the vaccination record if obtained from local pharmacy or Health Dept. Verbalized acceptance and understanding.  Qualifies for Shingles Vaccine? Yes   Zostavax completed Yes   Shingrix Completed?: No.    Education has been provided regarding the importance of this vaccine. Patient has been advised to call insurance company to determine out of pocket expense if they have not yet received this vaccine. Advised may also receive vaccine at local pharmacy or Health Dept. Verbalized acceptance and understanding.  Screening Tests Health Maintenance  Topic Date Due  . COVID-19 Vaccine (1) 04/19/2020 (Originally 07/17/1950)  . MAMMOGRAM  09/08/2020  . COLONOSCOPY (Pts 45-55yr Insurance coverage will need to be confirmed)  06/15/2022  . TETANUS/TDAP  07/11/2026  . INFLUENZA VACCINE  Completed  . DEXA SCAN  Completed  . Hepatitis C Screening  Completed  . PNA vac Low Risk Adult  Completed    Health Maintenance  There are no preventive care reminders to display for this patient.  Colorectal cancer screening: Type of screening: Colonoscopy. Completed 06/15/19. Repeat every 3 years  Mammogram status: Completed 09/09/19. Repeat every year  Bone Density status: Completed 08/24/18. Results reflect: Bone density results: OSTEOPOROSIS. Repeat every 2 years.  Lung Cancer Screening: (Low Dose CT Chest recommended if Age 75-80years, 30 pack-year currently smoking OR have quit w/in 15years.) does not qualify.    Additional Screening:  Hepatitis C Screening: does qualify; Completed 08/06/16  Vision Screening: Recommended annual ophthalmology exams for early detection of glaucoma and other disorders of the eye. Is the patient up to date with their annual eye exam?  Yes  Who is the provider or what is the name of the office in which the patient attends annual eye exams[place]: Recommended annual dental exams for proper oral hygiene  Community Resource Referral / Chronic Care Management: CRR required this visit?  No   CCM required this visit?  No      Plan:     I have personally reviewed and noted the following in the patient's chart:   . Medical and social history . Use of alcohol, tobacco or illicit drugs  . Current medications and supplements . Functional ability and status . Nutritional status . Physical activity . Advanced directives . List of other physicians . Hospitalizations, surgeries, and ER visits in previous 12 months . Vitals . Screenings to include cognitive, depression, and falls . Referrals and appointments  In addition, I have reviewed and discussed with patient  certain preventive protocols, quality metrics, and best practice recommendations. A written personalized care plan for preventive services as well as general preventive health recommendations were provided to patient.     KClemetine Marker LPN   [id number]  Nurse Notes: none

## 2020-04-11 NOTE — Progress Notes (Signed)
DUE TO COVID-19 ONLY ONE VISITOR IS ALLOWED TO COME WITH YOU AND STAY IN THE WAITING ROOM ONLY DURING PRE OP AND PROCEDURE DAY OF SURGERY. THE 1 VISITOR  MAY VISIT WITH YOU AFTER SURGERY IN YOUR PRIVATE ROOM DURING VISITING HOURS ONLY!  YOU NEED TO HAVE A COVID 19 TEST ON__2/01/2021 _____ @_______ , THIS TEST MUST BE DONE BEFORE SURGERY,  COVID TESTING SITE [address] [place] [address], IT IS ON THE RIGHT GOING OUT [address] APPROXIMATELY  2 MINUTES PAST [employer] ON THE RIGHT. ONCE YOUR COVID TEST IS COMPLETED,  PLEASE BEGIN THE QUARANTINE INSTRUCTIONS AS OUTLINED IN YOUR HANDOUT.                Kimberly Andrade  04/11/2020   Your procedure is scheduled on: 04/18/2020    Report to [facility] Main  Entrance   Report to admitting at    0530 AM     Call this number if you have problems the morning of surgery [phone]    REMEMBER: NO  SOLID FOOD CANDY OR GUM AFTER MIDNIGHT. CLEAR LIQUIDS UNTIL         . NOTHING BY MOUTH EXCEPT CLEAR LIQUIDS UNTIL  0430am   . PLEASE FINISH ENSURE DRINK PER SURGEON ORDER  WHICH NEEDS TO BE COMPLETED AT  0430am     .      CLEAR LIQUID DIET   Foods Allowed                                                                    Coffee and tea, regular and decaf                            Fruit ices (not with fruit pulp)                                      Iced Popsicles                                    Carbonated beverages, regular and diet                                    Cranberry, grape and apple juices Sports drinks like Gatorade Lightly seasoned clear broth or consume(fat free) Sugar, honey syrup ___________________________________________________________________      BRUSH YOUR TEETH MORNING OF SURGERY AND RINSE YOUR MOUTH OUT, NO CHEWING GUM CANDY OR MINTS.     Take these medicines the morning of surgery with A SIP OF WATER:  Gabapentin   DO NOT TAKE ANY DIABETIC MEDICATIONS DAY OF YOUR  SURGERY                               You may not have any metal on your body including hair pins and              piercings  Do  not wear jewelry, make-up, lotions, powders or perfumes, deodorant             Do not wear nail polish on your fingernails.  Do not shave  48 hours prior to surgery.              Men may shave face and neck.   Do not bring valuables to the hospital. [place].  Contacts, dentures or bridgework may not be worn into surgery.  Leave suitcase in the car. After surgery it may be brought to your room.     Patients discharged the day of surgery will not be allowed to drive home. IF YOU ARE HAVING SURGERY AND GOING HOME THE SAME DAY, YOU MUST HAVE AN ADULT TO DRIVE YOU HOME AND BE WITH YOU FOR 24 HOURS. YOU MAY GO HOME BY TAXI OR UBER OR ORTHERWISE, BUT AN ADULT MUST ACCOMPANY YOU HOME AND STAY WITH YOU FOR 24 HOURS.  Name and phone number of your driver:  Special Instructions: N/A              Please read over the following fact sheets you were given: _____________________________________________________________________  [facility] - Preparing for Surgery Before surgery, you can play an important role.  Because skin is not sterile, your skin needs to be as free of germs as possible.  You can reduce the number of germs on your skin by washing with CHG (chlorahexidine gluconate) soap before surgery.  CHG is an antiseptic cleaner which kills germs and bonds with the skin to continue killing germs even after washing. Please DO NOT use if you have an allergy to CHG or antibacterial soaps.  If your skin becomes reddened/irritated stop using the CHG and inform your nurse when you arrive at Short Stay. Do not shave (including legs and underarms) for at least 48 hours prior to the first CHG shower.  You may shave your face/neck. Please follow these instructions carefully:  1.  Shower with CHG Soap the night before surgery and the   morning of Surgery.  2.  If you choose to wash your hair, wash your hair first as usual with your  normal  shampoo.  3.  After you shampoo, rinse your hair and body thoroughly to remove the  shampoo.                           4.  Use CHG as you would any other liquid soap.  You can apply chg directly  to the skin and wash                       Gently with a scrungie or clean washcloth.  5.  Apply the CHG Soap to your body ONLY FROM THE NECK DOWN.   Do not use on face/ open                           Wound or open sores. Avoid contact with eyes, ears mouth and genitals (private parts).                       Wash face,  Genitals (private parts) with your normal soap.             6.  Wash thoroughly,  paying special attention to the area where your surgery  will be performed.  7.  Thoroughly rinse your body with warm water from the neck down.  8.  DO NOT shower/wash with your normal soap after using and rinsing off  the CHG Soap.                9.  Pat yourself dry with a clean towel.            10.  Wear clean pajamas.            11.  Place clean sheets on your bed the night of your first shower and do not  sleep with pets. Day of Surgery : Do not apply any lotions/deodorants the morning of surgery.  Please wear clean clothes to the hospital/surgery center.  FAILURE TO FOLLOW THESE INSTRUCTIONS MAY RESULT IN THE CANCELLATION OF YOUR SURGERY PATIENT SIGNATURE_________________________________  NURSE SIGNATURE__________________________________  ________________________________________________________________________

## 2020-04-12 ENCOUNTER — Ambulatory Visit: Payer: Medicare Other | Admitting: Internal Medicine

## 2020-04-13 ENCOUNTER — Encounter (HOSPITAL_COMMUNITY): Payer: Self-pay

## 2020-04-13 ENCOUNTER — Other Ambulatory Visit: Payer: Self-pay

## 2020-04-13 ENCOUNTER — Encounter (HOSPITAL_COMMUNITY)
Admission: RE | Admit: 2020-04-13 | Discharge: 2020-04-13 | Disposition: A | Discharge status: Home / Self Care | Payer: Medicare Other | Source: Ambulatory Visit | Attending: Orthopaedic Surgery | Admitting: Orthopaedic Surgery

## 2020-04-13 ENCOUNTER — Ambulatory Visit (HOSPITAL_COMMUNITY)
Admission: RE | Admit: 2020-04-13 | Discharge: 2020-04-13 | Disposition: A | Discharge status: Home / Self Care | Payer: Medicare Other | Source: Ambulatory Visit | Attending: Orthopaedic Surgery | Admitting: Orthopaedic Surgery

## 2020-04-13 DIAGNOSIS — Z01818 Encounter for other preprocedural examination: Secondary | ICD-10-CM | POA: Insufficient documentation

## 2020-04-13 LAB — BASIC METABOLIC PANEL
Anion gap: 12 (ref 5–15)
BUN: 23 mg/dL (ref 8–23)
CO2: 25 mmol/L (ref 22–32)
Calcium: 8.8 mg/dL — ABNORMAL LOW (ref 8.9–10.3)
Chloride: 103 mmol/L (ref 98–111)
Creatinine, Ser: 0.79 mg/dL (ref 0.44–1.00)
GFR, Estimated: >60 mL/min (ref >60)
Glucose, Bld: 119 mg/dL — ABNORMAL HIGH (ref 70–99)
Potassium: 3.6 mmol/L (ref 3.5–5.1)
Sodium: 140 mmol/L (ref 135–145)

## 2020-04-13 LAB — CBC WITH DIFFERENTIAL/PLATELET
Abs Immature Granulocytes: 0.02 10*3/uL (ref 0.00–0.07)
Basophils Absolute: 0 10*3/uL (ref 0.0–0.1)
Basophils Relative: 1 %
Eosinophils Absolute: 0.1 10*3/uL (ref 0.0–0.5)
Eosinophils Relative: 3 %
HCT: 44.8 % (ref 36.0–46.0)
Hemoglobin: 14.4 g/dL (ref 12.0–15.0)
Immature Granulocytes: 0 %
Lymphocytes Relative: 27 %
Lymphs Abs: 1.3 10*3/uL (ref 0.7–4.0)
MCH: 28.6 pg (ref 26.0–34.0)
MCHC: 32.1 g/dL (ref 30.0–36.0)
MCV: 88.9 fL (ref 80.0–100.0)
Monocytes Absolute: 0.5 10*3/uL (ref 0.1–1.0)
Monocytes Relative: 11 %
Neutro Abs: 2.9 10*3/uL (ref 1.7–7.7)
Neutrophils Relative %: 58 %
Platelets: 189 10*3/uL (ref 150–400)
RBC: 5.04 MIL/uL (ref 3.87–5.11)
RDW: 13.7 % (ref 11.5–15.5)
WBC: 4.9 10*3/uL (ref 4.0–10.5)
nRBC: 0 % (ref 0.0–0.2)

## 2020-04-13 LAB — URINALYSIS, ROUTINE W REFLEX MICROSCOPIC
Bilirubin Urine: NEGATIVE
Glucose, UA: NEGATIVE mg/dL
Ketones, ur: NEGATIVE mg/dL
Leukocytes,Ua: NEGATIVE
Nitrite: NEGATIVE
Protein, ur: NEGATIVE mg/dL
Specific Gravity, Urine: 1.029 (ref 1.005–1.030)
pH: 5 (ref 5.0–8.0)

## 2020-04-13 LAB — SURGICAL PCR SCREEN
MRSA, PCR: NEGATIVE
Staphylococcus aureus: NEGATIVE

## 2020-04-13 LAB — PROTIME-INR
INR: 1.1 (ref 0.8–1.2)
Prothrombin Time: 13.5 seconds (ref 11.4–15.2)

## 2020-04-13 LAB — APTT: aPTT: 34 seconds (ref 24–36)

## 2020-04-13 NOTE — Progress Notes (Addendum)
Anesthesia Review:  PCP: dr Army Melia - lov 02/16/20  Cardiologist : dr Nehemiah Massed followed for palpitations   LOV 06/28/19  Pulm- 01/13/20- DR Raul Del LOV  Chest x-ray : 04/13/20  EKG :04/13/20  Echo : Stress test: Cardiac Cath :  Activity level: can do a flight of stair without difficulty  Sleep Study/ CPAP : yes  Fasting Blood Sugar :      / Checks Blood Sugar -- times a day:   Blood Thinner/ Instructions /Last Dose: ASA / Instructions/ Last Dose :  81 mg ASA  01/13/2020- PFT  U/A done 04/13/20 routed to DR [facility].

## 2020-04-14 ENCOUNTER — Other Ambulatory Visit (HOSPITAL_COMMUNITY)
Admission: RE | Admit: 2020-04-14 | Discharge: 2020-04-14 | Disposition: A | Discharge status: Home / Self Care | Payer: Medicare Other | Source: Ambulatory Visit | Attending: Orthopaedic Surgery | Admitting: Orthopaedic Surgery

## 2020-04-14 DIAGNOSIS — Z20822 Contact with and (suspected) exposure to covid-19: Secondary | ICD-10-CM | POA: Diagnosis not present

## 2020-04-14 DIAGNOSIS — Z01812 Encounter for preprocedural laboratory examination: Secondary | ICD-10-CM | POA: Insufficient documentation

## 2020-04-14 LAB — SARS CORONAVIRUS 2 (TAT 6-24 HRS): SARS Coronavirus 2: NEGATIVE

## 2020-04-14 NOTE — H&P (Signed)
TOTAL KNEE ADMISSION H&P   Patient is being admitted for right total knee arthroplasty.  Subjective:  Chief Complaint:right knee pain.  HPI: Kimberly Andrade, 75 y.o. female, has a history of pain and functional disability in the right knee due to arthritis and has failed non-surgical conservative treatments for greater than 12 weeks to includeNSAID's and/or analgesics, corticosteriod injections, viscosupplementation injections, flexibility and strengthening excercises, supervised PT with diminished ADL's post treatment, use of assistive devices, weight reduction as appropriate and activity modification.  Onset of symptoms was gradual, starting 5 years ago with gradually worsening course since that time. The patient noted prior procedures on the knee to include  arthroscopy on the right knee(s).  Patient currently rates pain in the right knee(s) at 10 out of 10 with activity. Patient has night pain, worsening of pain with activity and weight bearing, pain that interferes with activities of daily living, crepitus and joint swelling.  Patient has evidence of subchondral cysts, subchondral sclerosis, periarticular osteophytes and joint space narrowing by imaging studies. There is no active infection.  Patient Active Problem List   Diagnosis Date Noted  . 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 Essential hypertension 08/16/2019  . Mild peripheral edema 07/27/2019  . Morton's neuroma 07/27/2019  . Atherosclerosis of abdominal aorta ([place]) 03/16/2018  . DDD (degenerative disc disease), 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/2017  . Prediabetes 12/25/2016  . Nocturnal leg cramps 12/25/2016  . 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 Urinary incontinence in female 10/16/2016  . Mixed hyperlipidemia 10/05/2015  . GERD (gastroesophageal reflux disease) 04/24/2015  . Crohn's disease of small intestine without complication ([place]) 58/52/7782  . Obstructive sleep apnea of adult 11/18/2013   Past Medical History:   Diagnosis Date  . Arthritis   . B12 deficiency   . Crohn disease ([place])   . Crohn's disease ([place])   . Diverticulosis   . DJD (degenerative joint disease)   . DJD (degenerative joint disease)   . Essential hypertension 08/16/2019  . Full dentures   . GERD (gastroesophageal reflux disease)    pt deneis   . Heart palpitations   . Hypercholesterolemia   . Hyperlipidemia   . Incontinence of urine   . Nocturnal leg cramps   . Osteopenia   . Osteoporosis   . Reflux   . Sleep apnea    uses a cpap  . Wears glasses     Past Surgical History:  Procedure Laterality Date  . ABDOMINAL HYSTERECTOMY    . APPENDECTOMY    . CARPEL TUNNEL    . CHOLECYSTECTOMY    . COLONOSCOPY WITH PROPOFOL N/A 03/11/2016   Procedure: COLONOSCOPY WITH PROPOFOL;  Surgeon: Lollie Sails, MD;  Location: [facility] ENDOSCOPY;  Service: Endoscopy;  Laterality: N/A;  . DILATION AND CURETTAGE OF UTERUS    . ESOPHAGOGASTRODUODENOSCOPY N/A 03/11/2016   Procedure: ESOPHAGOGASTRODUODENOSCOPY (EGD);  Surgeon: Lollie Sails, MD;  Location: [facility] ENDOSCOPY;  Service: Endoscopy;  Laterality: N/A;  . ESOPHAGOGASTRODUODENOSCOPY (EGD) WITH PROPOFOL N/A 11/11/2017   Procedure: ESOPHAGOGASTRODUODENOSCOPY (EGD) WITH PROPOFOL;  Surgeon: Lollie Sails, MD;  Location: [facility] ENDOSCOPY;  Service: Endoscopy;  Laterality: N/A;  . ESOPHAGOGASTRODUODENOSCOPY (EGD) WITH PROPOFOL N/A 01/20/2018   Procedure: ESOPHAGOGASTRODUODENOSCOPY (EGD) WITH PROPOFOL;  Surgeon: Lollie Sails, MD;  Location: [facility] ENDOSCOPY;  Service: Endoscopy;  Laterality: N/A;  . FOOT SURGERY Right   . HAMMER TOE SURGERY    . JOINT REPLACEMENT Right 2006   shoulder  . KNEE ARTHROSCOPY Right 05/25/2013   Procedure: ARTHROSCOPY KNEE;  Surgeon: Monico Blitz  Rhona Raider, MD;  Location: [place];  Service: Orthopedics;  Laterality: Right;  medial and lateral meniscal debridment and chondroplasty  . righti shoulder replacement     . ROBOTIC ASSISTED  LAPAROSCOPIC REPAIR OF PARAESOPHAGEAL HERNIA  07/16/2018  . SHOULDER ARTHROSCOPY WITH ROTATOR CUFF REPAIR AND SUBACROMIAL DECOMPRESSION Left 08/14/2017   Procedure: SHOULDER ARTHROSCOPY WITH ROTATOR CUFF REPAIR AND SUBACROMIAL DECOMPRESSION;  Surgeon: Tania Ade, MD;  Location: [place];  Service: Orthopedics;  Laterality: Left;  . SHOULDER SURGERY Right    X 3  . TOTAL HIP ARTHROPLASTY Left 11/25/2017   Procedure: LEFT TOTAL HIP ARTHROPLASTY ANTERIOR APPROACH;  Surgeon: Melrose Nakayama, MD;  Location: [place];  Service: Orthopedics;  Laterality: Left;    No current facility-administered medications for this encounter.   Current Outpatient Medications  Medication Sig Dispense Refill Last Dose  . Ascorbic Acid (VITAMIN C PO) Take 500 mg by mouth daily.     Marland Kitchen aspirin EC 81 MG tablet Take 81 mg by mouth daily.     . Calcium Carbonate-Vit D-Min (CALCIUM 600+D3 PLUS MINERALS) 600-800 MG-UNIT TABS Take 1 tablet by mouth daily.     . cholecalciferol (VITAMIN D) 25 MCG (1000 UNIT) tablet Take 1,000 Units by mouth daily.     Marland Kitchen gabapentin (NEURONTIN) 300 MG capsule Take 2 capsules (600 mg total) by mouth 2 (two) times daily. 360 capsule 1   . hydrochlorothiazide (HYDRODIURIL) 25 MG tablet Take 1 tablet (25 mg total) by mouth daily. 30 tablet 0   . losartan (COZAAR) 50 MG tablet Take 1 tablet (50 mg total) by mouth daily. 90 tablet 3   . Magnesium 250 MG TABS Take 250 mg by mouth daily.      . mesalamine (PENTASA) 500 MG CR capsule Take 2,000 mg by mouth 2 (two) times daily as needed (Crohn's).     . Multiple Vitamins-Minerals (MULTIVITAMIN WITH MINERALS) tablet Take 1 tablet by mouth daily.     . naproxen sodium (ALEVE) 220 MG tablet Take 440 mg by mouth 2 (two) times daily as needed (pain).     . NON FORMULARY BiPap nightly     . oxybutynin (DITROPAN XL) 15 MG 24 hr tablet Take 1 tablet (15 mg total) by mouth at bedtime. 30 tablet 0   . Potassium 99 MG TABS Take 99 mg by mouth daily.      .  pravastatin (PRAVACHOL) 40 MG tablet Take 40 mg by mouth daily.     Marland Kitchen PROLIA 60 MG/ML SOSY injection Inject 60 mg into the skin every 6 (six) months.     . vitamin B-12 (CYANOCOBALAMIN) 1000 MCG tablet Take 1,000 mcg by mouth daily.     Marland Kitchen zinc gluconate 50 MG tablet Take 50 mg by mouth daily.      Allergies  Allergen Reactions  . Fosamax [Alendronate Sodium] Other (See Comments)    Bones hurt  . Lipitor [Atorvastatin] Other (See Comments)    Bone pain  . Codeine Other (See Comments)     Codeine in the liquid form causes gastritis  . Hydrocodone Nausea And Vomiting    Social History   Tobacco Use  . Smoking status: Former Smoker    Quit date: 12/29/2001    Years since quitting: 18.3  . Smokeless tobacco: Never Used  Substance Use Topics  . Alcohol use: No    Family History  Problem Relation Age of Onset  . Heart disease Mother   . Esophageal cancer Father  Review of Systems  Musculoskeletal: Positive for arthralgias.       Right knee  All other systems reviewed and are negative.   Objective:  Physical Exam Constitutional:      Appearance: Normal appearance.  HENT:     Head: Normocephalic and atraumatic.     Nose: Nose normal.     Mouth/Throat:     Pharynx: Oropharynx is clear.  Eyes:     Extraocular Movements: Extraocular movements intact.  Cardiovascular:     Rate and Rhythm: Normal rate and regular rhythm.  Pulmonary:     Effort: Pulmonary effort is normal.  Abdominal:     Palpations: Abdomen is soft.  Musculoskeletal:     Cervical back: Normal range of motion.     Comments: Right knee motion remains good at 0 to almost 120 of flexion.  I do not feel an effusion.  She has medial greater than lateral pain with some crepitation.  Hip motion is good on both sides.  Straight leg raise is negative.   Skin:    General: Skin is warm and dry.  Neurological:     General: No focal deficit present.     Mental Status: She is alert and oriented to person, place,  and time.  Psychiatric:        Mood and Affect: Mood normal.        Behavior: Behavior normal.        Thought Content: Thought content normal.        Judgment: Judgment normal.     Vital signs in last 24 hours:    Labs:   Estimated body mass index is 33.11 kg/m as calculated from the following:   Height as of 04/13/20: 5' 2"  (1.575 m).   Weight as of 02/16/20: 82.1 kg.   Imaging Review Plain radiographs demonstrate severe degenerative joint disease of the right knee(s). The overall alignment isneutral. The bone quality appears to be good for age and reported activity level.      Assessment/Plan:  End stage primary arthritis, right knee   The patient history, physical examination, clinical judgment of the provider and imaging studies are consistent with end stage degenerative joint disease of the right knee(s) and total knee arthroplasty is deemed medically necessary. The treatment options including medical management, injection therapy arthroscopy and arthroplasty were discussed at length. The risks and benefits of total knee arthroplasty were presented and reviewed. The risks due to aseptic loosening, infection, stiffness, patella tracking problems, thromboembolic complications and other imponderables were discussed. The patient acknowledged the explanation, agreed to proceed with the plan and consent was signed. Patient is being admitted for inpatient treatment for surgery, pain control, PT, OT, prophylactic antibiotics, VTE prophylaxis, progressive ambulation and ADL's and discharge planning. The patient is planning to be discharged home with home health services     Patient's anticipated LOS is less than 2 midnights, meeting these requirements: - Younger than 23 - Lives within 1 hour of care - Has a competent adult at home to recover with post-op recover - NO history of  - Chronic pain requiring opiods  - Diabetes  - Coronary Artery Disease  - Heart failure  - Heart  attack  - Stroke  - DVT/VTE  - Cardiac arrhythmia  - Respiratory Failure/COPD  - Renal failure  - Anemia  - Advanced Liver disease

## 2020-04-17 MED ORDER — TRANEXAMIC ACID 1000 MG/10ML IV SOLN
2000.0000 mg | INTRAVENOUS | Status: DC
  Filled 2020-04-17: qty 20

## 2020-04-17 MED ORDER — BUPIVACAINE LIPOSOME 1.3 % IJ SUSP
20.0000 mL | INTRAMUSCULAR | Status: DC
  Filled 2020-04-17: qty 20

## 2020-04-17 NOTE — Anesthesia Preprocedure Evaluation (Addendum)
Anesthesia Evaluation  Patient identified by MRN, date of birth, ID band Patient awake    Reviewed: Allergy & Precautions, NPO status , Patient's Chart, lab work & pertinent test results  Airway Mallampati: I  TM Distance: >3 FB Neck ROM: Full    Dental  (+) Edentulous Upper, Edentulous Lower   Pulmonary sleep apnea and Continuous Positive Airway Pressure Ventilation , former smoker,    Pulmonary exam normal breath sounds clear to auscultation       Cardiovascular hypertension, Pt. on medications Normal cardiovascular exam Rhythm:Regular Rate:Normal     Neuro/Psych negative neurological ROS  negative psych ROS   GI/Hepatic negative GI ROS, Neg liver ROS, Crohn disease    Endo/Other  negative endocrine ROS  Renal/GU negative Renal ROS     Musculoskeletal  (+) Arthritis ,   Abdominal   Peds  Hematology HLD   Anesthesia Other Findings RIGHT KNEE DEGENERATIVE JOINT DISEASE  Reproductive/Obstetrics                            Anesthesia Physical Anesthesia Plan  ASA: II  Anesthesia Plan: Regional and Spinal   Post-op Pain Management:  Regional for Post-op pain   Induction:   PONV Risk Score and Plan: 2 and Ondansetron, Dexamethasone and Treatment may vary due to age or medical condition  Airway Management Planned: Simple Face Mask  Additional Equipment:   Intra-op Plan:   Post-operative Plan:   Informed Consent: I have reviewed the patients History and Physical, chart, labs and discussed the procedure including the risks, benefits and alternatives for the proposed anesthesia with the patient or authorized representative who has indicated his/her understanding and acceptance.       Plan Discussed with: CRNA  Anesthesia Plan Comments:         Anesthesia Quick Evaluation

## 2020-04-18 ENCOUNTER — Encounter (HOSPITAL_COMMUNITY)
Admission: RE | Disposition: A | Discharge status: Home / Self Care | Payer: Self-pay | Source: Ambulatory Visit | Attending: Orthopaedic Surgery

## 2020-04-18 ENCOUNTER — Encounter: Payer: Medicare Other | Admitting: Dermatology

## 2020-04-18 ENCOUNTER — Encounter (HOSPITAL_COMMUNITY): Payer: Self-pay | Admitting: Orthopaedic Surgery

## 2020-04-18 ENCOUNTER — Ambulatory Visit (HOSPITAL_COMMUNITY)
Admission: RE | Admit: 2020-04-18 | Discharge: 2020-04-18 | Disposition: A | Discharge status: Home / Self Care | Payer: Medicare Other | Source: Ambulatory Visit | Attending: Orthopaedic Surgery | Admitting: Orthopaedic Surgery

## 2020-04-18 ENCOUNTER — Ambulatory Visit (HOSPITAL_COMMUNITY): Payer: Medicare Other | Admitting: Physician Assistant

## 2020-04-18 ENCOUNTER — Ambulatory Visit (HOSPITAL_COMMUNITY): Payer: Medicare Other | Admitting: Anesthesiology

## 2020-04-18 DIAGNOSIS — E782 Mixed hyperlipidemia: Secondary | ICD-10-CM | POA: Insufficient documentation

## 2020-04-18 DIAGNOSIS — Z90710 Acquired absence of both cervix and uterus: Secondary | ICD-10-CM | POA: Diagnosis not present

## 2020-04-18 DIAGNOSIS — M1711 Unilateral primary osteoarthritis, right knee: Secondary | ICD-10-CM | POA: Insufficient documentation

## 2020-04-18 DIAGNOSIS — I110 Hypertensive heart disease with heart failure: Secondary | ICD-10-CM | POA: Insufficient documentation

## 2020-04-18 DIAGNOSIS — Z87891 Personal history of nicotine dependence: Secondary | ICD-10-CM | POA: Insufficient documentation

## 2020-04-18 DIAGNOSIS — Z79899 Other long term (current) drug therapy: Secondary | ICD-10-CM | POA: Diagnosis not present

## 2020-04-18 DIAGNOSIS — Z9049 Acquired absence of other specified parts of digestive tract: Secondary | ICD-10-CM | POA: Insufficient documentation

## 2020-04-18 DIAGNOSIS — M6281 Muscle weakness (generalized): Secondary | ICD-10-CM | POA: Insufficient documentation

## 2020-04-18 DIAGNOSIS — Z888 Allergy status to other drugs, medicaments and biological substances status: Secondary | ICD-10-CM | POA: Diagnosis not present

## 2020-04-18 DIAGNOSIS — Z96642 Presence of left artificial hip joint: Secondary | ICD-10-CM | POA: Insufficient documentation

## 2020-04-18 DIAGNOSIS — R2681 Unsteadiness on feet: Secondary | ICD-10-CM | POA: Diagnosis not present

## 2020-04-18 DIAGNOSIS — I509 Heart failure, unspecified: Secondary | ICD-10-CM | POA: Insufficient documentation

## 2020-04-18 DIAGNOSIS — Z7982 Long term (current) use of aspirin: Secondary | ICD-10-CM | POA: Insufficient documentation

## 2020-04-18 DIAGNOSIS — Z96611 Presence of right artificial shoulder joint: Secondary | ICD-10-CM | POA: Diagnosis not present

## 2020-04-18 DIAGNOSIS — Z885 Allergy status to narcotic agent status: Secondary | ICD-10-CM | POA: Diagnosis not present

## 2020-04-18 DIAGNOSIS — G4733 Obstructive sleep apnea (adult) (pediatric): Secondary | ICD-10-CM | POA: Insufficient documentation

## 2020-04-18 HISTORY — PX: TOTAL KNEE ARTHROPLASTY: SHX125

## 2020-04-18 LAB — TYPE AND SCREEN
ABO/RH(D): O POS
Antibody Screen: NEGATIVE

## 2020-04-18 SURGERY — ARTHROPLASTY, KNEE, TOTAL
Anesthesia: Regional | Site: Knee | Laterality: Right

## 2020-04-18 MED ORDER — LACTATED RINGERS IV BOLUS
250.0000 mL | Freq: Once | INTRAVENOUS | Status: AC
  Administered 2020-04-18: 250 mL via INTRAVENOUS

## 2020-04-18 MED ORDER — FENTANYL CITRATE (PF) 100 MCG/2ML IJ SOLN
INTRAMUSCULAR | Status: DC | PRN
  Administered 2020-04-18 (×5): 25 ug via INTRAVENOUS
  Administered 2020-04-18: 50 ug via INTRAVENOUS

## 2020-04-18 MED ORDER — KETOROLAC TROMETHAMINE 15 MG/ML IJ SOLN
INTRAMUSCULAR | Status: AC
  Administered 2020-04-18: 7.5 mg via INTRAVENOUS
  Filled 2020-04-18: qty 1

## 2020-04-18 MED ORDER — PHENYLEPHRINE HCL-NACL 10-0.9 MG/250ML-% IV SOLN
INTRAVENOUS | Status: AC
  Filled 2020-04-18: qty 500

## 2020-04-18 MED ORDER — ONDANSETRON HCL 4 MG/2ML IJ SOLN
INTRAMUSCULAR | Status: DC | PRN
  Administered 2020-04-18: 4 mg via INTRAVENOUS

## 2020-04-18 MED ORDER — ONDANSETRON HCL 4 MG/2ML IJ SOLN: 4.0000 mg | Freq: Once | INTRAMUSCULAR | Status: DC | PRN

## 2020-04-18 MED ORDER — FENTANYL CITRATE (PF) 100 MCG/2ML IJ SOLN
INTRAMUSCULAR | Status: AC
  Filled 2020-04-18: qty 2

## 2020-04-18 MED ORDER — 0.9 % SODIUM CHLORIDE (POUR BTL) OPTIME
TOPICAL | Status: DC | PRN
  Administered 2020-04-18: 1000 mL

## 2020-04-18 MED ORDER — MIDAZOLAM HCL 5 MG/5ML IJ SOLN
INTRAMUSCULAR | Status: DC | PRN
  Administered 2020-04-18: 1 mg via INTRAVENOUS

## 2020-04-18 MED ORDER — CEFAZOLIN SODIUM-DEXTROSE 2-4 GM/100ML-% IV SOLN
2.0000 g | INTRAVENOUS | Status: AC
  Administered 2020-04-18: 2 g via INTRAVENOUS
  Filled 2020-04-18: qty 100

## 2020-04-18 MED ORDER — TRANEXAMIC ACID-NACL 1000-0.7 MG/100ML-% IV SOLN
1000.0000 mg | INTRAVENOUS | Status: AC
  Administered 2020-04-18: 1000 mg via INTRAVENOUS
  Filled 2020-04-18: qty 100

## 2020-04-18 MED ORDER — HYDROCODONE-ACETAMINOPHEN 5-325 MG PO TABS: 1.0000 | ORAL_TABLET | Freq: Four times a day (QID) | ORAL | 0 refills | Status: DC | PRN

## 2020-04-18 MED ORDER — CEFAZOLIN SODIUM-DEXTROSE 2-4 GM/100ML-% IV SOLN: 2.0000 g | Freq: Four times a day (QID) | INTRAVENOUS | Status: DC

## 2020-04-18 MED ORDER — METHOCARBAMOL 500 MG IVPB - SIMPLE MED
500.0000 mg | Freq: Four times a day (QID) | INTRAVENOUS | Status: DC | PRN
  Administered 2020-04-18: 500 mg via INTRAVENOUS

## 2020-04-18 MED ORDER — METOCLOPRAMIDE HCL 5 MG PO TABS
5.0000 mg | ORAL_TABLET | Freq: Three times a day (TID) | ORAL | Status: DC | PRN
  Filled 2020-04-18: qty 2

## 2020-04-18 MED ORDER — ACETAMINOPHEN 500 MG PO TABS
1000.0000 mg | ORAL_TABLET | Freq: Once | ORAL | Status: AC
  Administered 2020-04-18: 1000 mg via ORAL
  Filled 2020-04-18: qty 2

## 2020-04-18 MED ORDER — PROMETHAZINE HCL 12.5 MG PO TABS: 12.5000 mg | ORAL_TABLET | Freq: Four times a day (QID) | ORAL | 0 refills | Status: DC | PRN

## 2020-04-18 MED ORDER — PROPOFOL 10 MG/ML IV BOLUS
INTRAVENOUS | Status: AC
  Filled 2020-04-18: qty 40

## 2020-04-18 MED ORDER — CEFAZOLIN SODIUM-DEXTROSE 2-4 GM/100ML-% IV SOLN
INTRAVENOUS | Status: AC
  Administered 2020-04-18: 2 g via INTRAVENOUS
  Filled 2020-04-18: qty 100

## 2020-04-18 MED ORDER — KETOROLAC TROMETHAMINE 30 MG/ML IJ SOLN
INTRAMUSCULAR | Status: AC
  Filled 2020-04-18: qty 1

## 2020-04-18 MED ORDER — LACTATED RINGERS IV BOLUS
500.0000 mL | Freq: Once | INTRAVENOUS | Status: AC
  Administered 2020-04-18: 500 mL via INTRAVENOUS

## 2020-04-18 MED ORDER — TRANEXAMIC ACID-NACL 1000-0.7 MG/100ML-% IV SOLN: 1000.0000 mg | Freq: Once | INTRAVENOUS | Status: DC

## 2020-04-18 MED ORDER — GLYCOPYRROLATE 0.2 MG/ML IJ SOLN
INTRAMUSCULAR | Status: DC | PRN
  Administered 2020-04-18: .2 mg via INTRAVENOUS

## 2020-04-18 MED ORDER — LIDOCAINE HCL (PF) 2 % IJ SOLN
INTRAMUSCULAR | Status: AC
  Filled 2020-04-18: qty 5

## 2020-04-18 MED ORDER — HYDROCODONE-ACETAMINOPHEN 5-325 MG PO TABS: 1.0000 | ORAL_TABLET | ORAL | Status: DC | PRN

## 2020-04-18 MED ORDER — MIDAZOLAM HCL 2 MG/2ML IJ SOLN
INTRAMUSCULAR | Status: AC
  Filled 2020-04-18: qty 2

## 2020-04-18 MED ORDER — ROPIVACAINE HCL 5 MG/ML IJ SOLN
INTRAMUSCULAR | Status: DC | PRN
  Administered 2020-04-18: 30 mL via PERINEURAL

## 2020-04-18 MED ORDER — HYDROCODONE-ACETAMINOPHEN 7.5-325 MG PO TABS: 1.0000 | ORAL_TABLET | ORAL | Status: DC | PRN

## 2020-04-18 MED ORDER — ACETAMINOPHEN 325 MG PO TABS: 325.0000 mg | ORAL_TABLET | Freq: Four times a day (QID) | ORAL | Status: DC | PRN

## 2020-04-18 MED ORDER — MORPHINE SULFATE (PF) 4 MG/ML IV SOLN: 0.5000 mg | INTRAVENOUS | Status: DC | PRN

## 2020-04-18 MED ORDER — BUPIVACAINE-MELOXICAM ER 400-12 MG/14ML IJ SOLN
INTRAMUSCULAR | Status: AC
  Filled 2020-04-18: qty 1

## 2020-04-18 MED ORDER — SODIUM CHLORIDE 0.9 % IV SOLN
INTRAVENOUS | Status: DC | PRN
  Administered 2020-04-18: 30 mL via INTRAMUSCULAR

## 2020-04-18 MED ORDER — BUPIVACAINE-EPINEPHRINE (PF) 0.25% -1:200000 IJ SOLN
INTRAMUSCULAR | Status: DC | PRN
  Administered 2020-04-18: 30 mL via PERINEURAL

## 2020-04-18 MED ORDER — FENTANYL CITRATE (PF) 100 MCG/2ML IJ SOLN
25.0000 ug | INTRAMUSCULAR | Status: DC | PRN
  Administered 2020-04-18: 25 ug via INTRAVENOUS

## 2020-04-18 MED ORDER — METHOCARBAMOL 500 MG PO TABS: 500.0000 mg | ORAL_TABLET | Freq: Four times a day (QID) | ORAL | Status: DC | PRN

## 2020-04-18 MED ORDER — KETOROLAC TROMETHAMINE 15 MG/ML IJ SOLN: 7.5000 mg | Freq: Four times a day (QID) | INTRAMUSCULAR | Status: DC

## 2020-04-18 MED ORDER — METOCLOPRAMIDE HCL 5 MG/ML IJ SOLN: 5.0000 mg | Freq: Three times a day (TID) | INTRAMUSCULAR | Status: DC | PRN

## 2020-04-18 MED ORDER — GLYCOPYRROLATE PF 0.2 MG/ML IJ SOSY
PREFILLED_SYRINGE | INTRAMUSCULAR | Status: AC
  Filled 2020-04-18: qty 1

## 2020-04-18 MED ORDER — PROPOFOL 1000 MG/100ML IV EMUL
INTRAVENOUS | Status: AC
  Filled 2020-04-18: qty 300

## 2020-04-18 MED ORDER — ONDANSETRON HCL 4 MG PO TABS
4.0000 mg | ORAL_TABLET | Freq: Four times a day (QID) | ORAL | Status: DC | PRN
  Filled 2020-04-18: qty 1

## 2020-04-18 MED ORDER — LACTATED RINGERS IV BOLUS: 250.0000 mL | Freq: Once | INTRAVENOUS | Status: DC

## 2020-04-18 MED ORDER — BUPIVACAINE-EPINEPHRINE (PF) 0.25% -1:200000 IJ SOLN
INTRAMUSCULAR | Status: AC
  Filled 2020-04-18: qty 30

## 2020-04-18 MED ORDER — LACTATED RINGERS IV SOLN: INTRAVENOUS | Status: DC

## 2020-04-18 MED ORDER — TRANEXAMIC ACID 1000 MG/10ML IV SOLN
INTRAVENOUS | Status: DC | PRN
  Administered 2020-04-18: 2000 mg via TOPICAL

## 2020-04-18 MED ORDER — DEXAMETHASONE SODIUM PHOSPHATE 10 MG/ML IJ SOLN
INTRAMUSCULAR | Status: DC | PRN
  Administered 2020-04-18: 4 mg via INTRAVENOUS

## 2020-04-18 MED ORDER — PROPOFOL 10 MG/ML IV BOLUS
INTRAVENOUS | Status: DC | PRN
  Administered 2020-04-18: 150 mg via INTRAVENOUS

## 2020-04-18 MED ORDER — POVIDONE-IODINE 10 % EX SWAB
2.0000 "application " | Freq: Once | CUTANEOUS | Status: AC
  Administered 2020-04-18: 2 via TOPICAL

## 2020-04-18 MED ORDER — EPHEDRINE 5 MG/ML INJ
INTRAVENOUS | Status: AC
  Filled 2020-04-18: qty 10

## 2020-04-18 MED ORDER — ACETAMINOPHEN 500 MG PO TABS: 500.0000 mg | ORAL_TABLET | Freq: Four times a day (QID) | ORAL | Status: DC

## 2020-04-18 MED ORDER — FENTANYL CITRATE (PF) 100 MCG/2ML IJ SOLN
INTRAMUSCULAR | Status: AC
  Administered 2020-04-18: 25 ug via INTRAVENOUS
  Filled 2020-04-18: qty 2

## 2020-04-18 MED ORDER — ASPIRIN EC 81 MG PO TBEC
81.0000 mg | DELAYED_RELEASE_TABLET | Freq: Two times a day (BID) | ORAL | 0 refills | Status: DC
Start: 2020-04-18 — End: 2023-04-07

## 2020-04-18 MED ORDER — ONDANSETRON HCL 4 MG/2ML IJ SOLN: 4.0000 mg | Freq: Four times a day (QID) | INTRAMUSCULAR | Status: DC | PRN

## 2020-04-18 MED ORDER — BUPIVACAINE-EPINEPHRINE 0.5% -1:200000 IJ SOLN
INTRAMUSCULAR | Status: AC
  Filled 2020-04-18: qty 1

## 2020-04-18 MED ORDER — SODIUM CHLORIDE 0.9 % IV SOLN: INTRAVENOUS | Status: DC | PRN

## 2020-04-18 MED ORDER — TIZANIDINE HCL 4 MG PO TABS: 4.0000 mg | ORAL_TABLET | Freq: Four times a day (QID) | ORAL | 1 refills | Status: DC | PRN

## 2020-04-18 MED ORDER — METHOCARBAMOL 500 MG IVPB - SIMPLE MED
INTRAVENOUS | Status: AC
  Filled 2020-04-18: qty 50

## 2020-04-18 MED ORDER — EPHEDRINE SULFATE-NACL 50-0.9 MG/10ML-% IV SOSY
PREFILLED_SYRINGE | INTRAVENOUS | Status: DC | PRN
  Administered 2020-04-18 (×2): 5 mg via INTRAVENOUS

## 2020-04-18 MED ORDER — HYDROCODONE-ACETAMINOPHEN 5-325 MG PO TABS
ORAL_TABLET | ORAL | Status: AC
  Administered 2020-04-18: 1 via ORAL
  Filled 2020-04-18: qty 1

## 2020-04-18 MED ORDER — ONDANSETRON HCL 4 MG/2ML IJ SOLN
INTRAMUSCULAR | Status: AC
  Filled 2020-04-18: qty 6

## 2020-04-18 MED ORDER — DEXAMETHASONE SODIUM PHOSPHATE 10 MG/ML IJ SOLN
INTRAMUSCULAR | Status: AC
  Filled 2020-04-18: qty 3

## 2020-04-18 SURGICAL SUPPLY — 53 items
ATTUNE PS FEM RT SZ 3 CEM KNEE (Femur) ×2 IMPLANT
ATTUNE PSRP INSR SZ3 8 KNEE (Insert) ×2 IMPLANT
BAG DECANTER FOR FLEXI CONT (MISCELLANEOUS) ×2 IMPLANT
BAG ZIPLOCK 12X15 (MISCELLANEOUS) ×2 IMPLANT
BASEPLATE TIBIAL ROTATING SZ 4 (Knees) ×2 IMPLANT
BLADE SAGITTAL 25.0X1.19X90 (BLADE) ×2 IMPLANT
BLADE SAW SGTL 11.0X1.19X90.0M (BLADE) ×2 IMPLANT
BNDG ELASTIC 6X10 VLCR STRL LF (GAUZE/BANDAGES/DRESSINGS) ×2 IMPLANT
BNDG ELASTIC 6X5.8 VLCR STR LF (GAUZE/BANDAGES/DRESSINGS) ×2 IMPLANT
BOOTIES KNEE HIGH SLOAN (MISCELLANEOUS) ×2 IMPLANT
BOWL SMART MIX CTS (DISPOSABLE) ×2 IMPLANT
CEMENT HV SMART SET (Cement) ×4 IMPLANT
COVER SURGICAL LIGHT HANDLE (MISCELLANEOUS) ×2 IMPLANT
COVER WAND RF STERILE (DRAPES) ×2 IMPLANT
CUFF TOURN SGL QUICK 34 (TOURNIQUET CUFF) ×2
CUFF TRNQT CYL 34X4.125X (TOURNIQUET CUFF) ×1 IMPLANT
DECANTER SPIKE VIAL GLASS SM (MISCELLANEOUS) ×4 IMPLANT
DRAPE ORTHO SPLIT 77X108 STRL (DRAPES)
DRAPE SHEET LG 3/4 BI-LAMINATE (DRAPES) ×2 IMPLANT
DRAPE SURG ORHT 6 SPLT 77X108 (DRAPES) IMPLANT
DRAPE TOP 10253 STERILE (DRAPES) ×2 IMPLANT
DRAPE U-SHAPE 47X51 STRL (DRAPES) ×2 IMPLANT
DRESSING AQUACEL AG SP 3.5X10 (GAUZE/BANDAGES/DRESSINGS) ×1 IMPLANT
DRSG AQUACEL AG ADV 3.5X10 (GAUZE/BANDAGES/DRESSINGS) ×2 IMPLANT
DRSG AQUACEL AG SP 3.5X10 (GAUZE/BANDAGES/DRESSINGS) ×2
DURAPREP 26ML APPLICATOR (WOUND CARE) ×4 IMPLANT
ELECT REM PT RETURN 15FT ADLT (MISCELLANEOUS) ×2 IMPLANT
GLOVE SRG 8 PF TXTR STRL LF DI (GLOVE) ×2 IMPLANT
GLOVE SURG ENC MOIS LTX SZ8 (GLOVE) ×4 IMPLANT
GLOVE SURG UNDER POLY LF SZ8 (GLOVE) ×4
GOWN STRL REUS W/TWL XL LVL3 (GOWN DISPOSABLE) ×4 IMPLANT
HANDPIECE INTERPULSE COAX TIP (DISPOSABLE) ×2
HOLDER FOLEY CATH W/STRAP (MISCELLANEOUS) IMPLANT
HOOD PEEL AWAY FLYTE STAYCOOL (MISCELLANEOUS) ×6 IMPLANT
KIT TURNOVER KIT A (KITS) ×2 IMPLANT
MANIFOLD NEPTUNE II (INSTRUMENTS) ×2 IMPLANT
NS IRRIG 1000ML POUR BTL (IV SOLUTION) ×2 IMPLANT
PACK TOTAL KNEE CUSTOM (KITS) ×2 IMPLANT
PAD ARMBOARD 7.5X6 YLW CONV (MISCELLANEOUS) ×2 IMPLANT
PATELLA MEDIAL ATTUN 35MM KNEE (Knees) ×2 IMPLANT
PENCIL SMOKE EVACUATOR (MISCELLANEOUS) IMPLANT
PIN DRILL FIX HALF THREAD (BIT) ×2 IMPLANT
PIN STEINMAN FIXATION KNEE (PIN) ×2 IMPLANT
PROTECTOR NERVE ULNAR (MISCELLANEOUS) ×2 IMPLANT
SET HNDPC FAN SPRY TIP SCT (DISPOSABLE) ×1 IMPLANT
SUT ETHIBOND NAB CT1 #1 30IN (SUTURE) ×4 IMPLANT
SUT VIC AB 0 CT1 36 (SUTURE) ×2 IMPLANT
SUT VIC AB 2-0 CT1 27 (SUTURE) ×2
SUT VIC AB 2-0 CT1 TAPERPNT 27 (SUTURE) ×1 IMPLANT
SUT VICRYL AB 3-0 FS1 BRD 27IN (SUTURE) ×2 IMPLANT
TRAY FOLEY MTR SLVR 16FR STAT (SET/KITS/TRAYS/PACK) IMPLANT
WATER STERILE IRR 1000ML POUR (IV SOLUTION) ×2 IMPLANT
WRAP KNEE MAXI GEL POST OP (GAUZE/BANDAGES/DRESSINGS) ×2 IMPLANT

## 2020-04-18 NOTE — Anesthesia Procedure Notes (Signed)
Anesthesia Regional Block: Adductor canal block   Pre-Anesthetic Checklist: ,, timeout performed, Correct Patient, Correct Site, Correct Laterality, Correct Procedure,, site marked, risks and benefits discussed, Surgical consent,  Pre-op evaluation,  At surgeon's request and post-op pain management  Laterality: Right  Prep: chloraprep       Needles:  Injection technique: Single-shot  Needle Type: Echogenic Stimulator Needle     Needle Length: 10cm  Needle Gauge: 20     Additional Needles:   Procedures:,,,, ultrasound used (permanent image in chart),,,,  Narrative:  Start time: 04/18/2020 6:50 AM End time: 04/18/2020 7:00 AM Injection made incrementally with aspirations every 5 mL.  Performed by: Personally  Anesthesiologist: Murvin Natal, MD  Additional Notes: Functioning IV was confirmed and monitors were applied. A time-out was performed. Hand hygiene and sterile gloves were used. The thigh was placed in a frog-leg position and prepped in a sterile fashion. A 124m 20ga BBraun echogenic stimulator needle was placed using ultrasound guidance.  Negative aspiration and negative test dose prior to incremental administration of local anesthetic. The patient tolerated the procedure well.

## 2020-04-18 NOTE — Anesthesia Procedure Notes (Signed)
Procedure Name: LMA Insertion Date/Time: 04/18/2020 7:55 AM Performed by: Lavina Hamman, CRNA Pre-anesthesia Checklist: Patient identified, Emergency Drugs available, Suction available and Patient being monitored Patient Re-evaluated:Patient Re-evaluated prior to induction Oxygen Delivery Method: Circle System Utilized Preoxygenation: Pre-oxygenation with 100% oxygen Induction Type: IV induction Ventilation: Mask ventilation without difficulty LMA: LMA with gastric port inserted LMA Size: 4.0 Number of attempts: 1 Airway Equipment and Method: Bite block Placement Confirmation: positive ETCO2 Tube secured with: Tape Dental Injury: Teeth and Oropharynx as per pre-operative assessment

## 2020-04-18 NOTE — Anesthesia Postprocedure Evaluation (Signed)
Anesthesia Post Note  Patient: Kimberly Andrade  Procedure(s) Performed: RIGHT TOTAL KNEE ARTHROPLASTY (Right Knee)     Patient location during evaluation: PACU Anesthesia Type: Regional and General Level of consciousness: awake Pain management: pain level controlled Vital Signs Assessment: post-procedure vital signs reviewed and stable Respiratory status: spontaneous breathing, nonlabored ventilation, respiratory function stable and patient connected to nasal cannula oxygen Cardiovascular status: blood pressure returned to baseline and stable Postop Assessment: no apparent nausea or vomiting Anesthetic complications: no   No complications documented.  Last Vitals:  Vitals:   04/18/20 1300 04/18/20 1400  BP: 100/80 121/72  Pulse:    Resp:    Temp:    SpO2:      Last Pain:  Vitals:   04/18/20 1400  TempSrc:   PainSc: 3                  Ryan P Ellender

## 2020-04-18 NOTE — Op Note (Signed)
PREOP DIAGNOSIS: DJD RIGHT KNEE POSTOP DIAGNOSIS: same PROCEDURE: RIGHT TKR ANESTHESIA: General and block ATTENDING SURGEON: Hessie Dibble ASSISTANT: Loni Dolly PA  INDICATIONS FOR PROCEDURE: Kimberly Andrade is a 75 y.o. female who has struggled for a long time with pain due to degenerative arthritis of the right knee.  The patient has failed many conservative non-operative measures and at this point has pain which limits the ability to sleep and walk.  The patient is offered total knee replacement.  Informed operative consent was obtained after discussion of possible risks of anesthesia, infection, neurovascular injury, DVT, and death.  The importance of the post-operative rehabilitation protocol to optimize result was stressed extensively with the patient.  SUMMARY OF FINDINGS AND PROCEDURE:  Kimberly Andrade was taken to the operative suite where under the above anesthesia a right knee replacement was performed.  There were advanced degenerative changes and the bone quality was good.  We used the DePuy Attune system and placed size 3 femur, 4 tibia, 35 mm all polyethylene patella, and a size 8 mm spacer.  Loni Dolly PA-C assisted throughout and was invaluable to the completion of the case in that he helped retract and maintain exposure while I placed components.  He also helped close thereby minimizing OR time.  The patient was admitted for appropriate post-op care to include perioperative antibiotics and mechanical and pharmacologic measures for DVT prophylaxis.  DESCRIPTION OF PROCEDURE:  Kimberly Andrade was taken to the operative suite where the above anesthesia was applied.  The patient was positioned supine and prepped and draped in normal sterile fashion.  An appropriate time out was performed.  After the administration of kefzol pre-op antibiotic the leg was elevated and exsanguinated and a tourniquet inflated. A standard longitudinal incision was made on the anterior  knee.  Dissection was carried down to the extensor mechanism.  All appropriate anti-infective measures were used including the pre-operative antibiotic, betadine impregnated drape, and closed hooded exhaust systems for each member of the surgical team.  A medial parapatellar incision was made in the extensor mechanism and the knee cap flipped and the knee flexed.  Some residual meniscal tissues were removed along with any remaining ACL/PCL tissue.  A guide was placed on the tibia and a flat cut was made on it's superior surface.  An intramedullary guide was placed in the femur and was utilized to make anterior and posterior cuts creating an appropriate flexion gap.  A second intramedullary guide was placed in the femur to make a distal cut properly balancing the knee with an extension gap equal to the flexion gap.  The three bones sized to the above mentioned sizes and the appropriate guides were placed and utilized.  A trial reduction was done and the knee easily came to full extension and the patella tracked well on flexion.  The trial components were removed and all bones were cleaned with pulsatile lavage and then dried thoroughly.  Cement was mixed and was pressurized onto the bones followed by placement of the aforementioned components.  Excess cement was trimmed and pressure was held on the components until the cement had hardened.  The tourniquet was deflated and a small amount of bleeding was controlled with cautery and pressure.  The knee was irrigated thoroughly.  The extensor mechanism was re-approximated with #1 ethibond in interrupted fashion.  The knee was flexed and the repair was solid.  The subcutaneous tissues were re-approximated with #0 and #2-0 vicryl and the skin closed with  a subcuticular stitch and steristrips.  A sterile dressing was applied.  Intraoperative fluids, EBL, and tourniquet time can be obtained from anesthesia records.  DISPOSITION:  The patient was taken to recovery room in  stable condition and admitted for appropriate post-op care to include peri-operative antibiotic and DVT prophylaxis with mechanical and pharmacologic measures.  Hessie Dibble 04/18/2020, 9:07 AM

## 2020-04-18 NOTE — Progress Notes (Signed)
Physical Therapy Treatment Patient Details Name: Kimberly Andrade MRN: [id number] DOB: Feb 08, 1946 Today's Date: 04/18/2020    History of Present Illness patient is a 75 y.o. female s/p Rt TKA on 04/18/2020 with PMHsignificant for osteoporosis, HLD, hypercholesteremia, heart palpitations, GERD, HTN, DJD, diverticulosis, OA, Lt THA (2019), R shoulder surgery, L RTCR/subacromial decompression (2019), R knee arthroscopy (2015).    PT Comments    Upon entrance, pt reported feeling much better. Pt ambulated with MIN guard progressing to supervision for safety with cues for RW management and step to gait pattern with no LOB.  Pt performed safe stair negotiation with MIN assist for safety and RW stability and verbal cues for sequencing. Pt denied feeling lightheaded, dizzy, or nauseous throughout session and BP remained stable. Pt was able to verbalize safe guarding position for family members at home. PT reviewed HEP for promotion of DVT Prevention and provided handout. Pt's husband and step son will be available to assist at home. Pt is currently at a safe mobility level for discharge home. Pt will benefit from continued skilled physical therapy in order to maximize functional mobility and independence.       Follow Up Recommendations  Follow surgeon's recommendation for DC plan and follow-up therapies;Home health PT     Equipment Recommendations  None recommended by PT (pt owns RW)    Recommendations for Other Services       Precautions / Restrictions Precautions Precautions: Fall Restrictions Weight Bearing Restrictions: Yes    Mobility  Bed Mobility Overal bed mobility: Needs Assistance Bed Mobility: Supine to Sit     Supine to sit: Supervision;HOB elevated     General bed mobility comments: supervision for safety with use of bed rail and B UEs to scoot to EOB.    Transfers Overall transfer level: Needs assistance Equipment used: Rolling walker (2 wheeled) Transfers: Sit  to/from Stand Sit to Stand: Min guard         General transfer comment: MIN guard for safety from EOB and toilet with cues for safe hand placement.  Ambulation/Gait Ambulation/Gait assistance: Min guard;Supervision Gait Distance (Feet): 120 Feet Assistive device: Rolling walker (2 wheeled) Gait Pattern/deviations: Step-to pattern;Decreased stride length;Decreased weight shift to right Gait velocity: decr   General Gait Details: MIN guard progressing to supervision for safety with cues for RW management and step to gait pattern with no LOB observed.   Stairs Stairs: Yes Stairs assistance: Min assist Stair Management: No rails;Backwards;With walker Number of Stairs: 3 General stair comments: MIN assist for safety and RW management with cues for sequencing. Pt denied feeling dizzy, lightheaded, or nauseous throughout session. Pt was able to verbalize safe guarding position for family members when assisting at home.   Wheelchair Mobility    Modified Rankin (Stroke Patients Only)       Balance Overall balance assessment: Needs assistance Sitting-balance support: Single extremity supported Sitting balance-Leahy Scale: Good     Standing balance support: Bilateral upper extremity supported;During functional activity Standing balance-Leahy Scale: Poor Standing balance comment: use of RW to maintain standing balance                            Cognition Arousal/Alertness: Awake/alert Behavior During Therapy: WFL for tasks assessed/performed Overall Cognitive Status: Within Functional Limits for tasks assessed  Exercises Total Joint Exercises Ankle Circles/Pumps: AROM;Both;15 reps;Seated Quad Sets: AROM;Right;5 reps;Seated Short Arc Quad: AROM;Right;5 reps;Seated Heel Slides: AROM;Right;5 reps;Seated Hip ABduction/ADduction: AROM;Right;5 reps;Seated Long Arc Quad: AROM;Right;5 reps;Seated    General  Comments        Pertinent Vitals/Pain Pain Assessment: 0-10 Pain Score: 1  Pain Location: Rt knee Pain Descriptors / Indicators: Discomfort;Tender Pain Intervention(s): Limited activity within patient's tolerance;Monitored during session;Repositioned    Home Living                      Prior Function            PT Goals (current goals can now be found in the care plan section) Acute Rehab PT Goals Patient Stated Goal: get back to yard work PT Goal Formulation: With patient Time For Goal Achievement: 04/25/20 Potential to Achieve Goals: Good Progress towards PT goals: Progressing toward goals    Frequency    7X/week      PT Plan Current plan remains appropriate    Co-evaluation              AM-PAC PT "6 Clicks" Mobility   Outcome Measure  Help needed turning from your back to your side while in a flat bed without using bedrails?: None Help needed moving from lying on your back to sitting on the side of a flat bed without using bedrails?: None Help needed moving to and from a bed to a chair (including a wheelchair)?: A Little Help needed standing up from a chair using your arms (e.g., wheelchair or bedside chair)?: A Little Help needed to walk in hospital room?: A Little Help needed climbing 3-5 steps with a railing? : A Little 6 Click Score: 20    End of Session Equipment Utilized During Treatment: Gait belt Activity Tolerance: Patient tolerated treatment well Patient left: in chair;with call bell/phone within reach Nurse Communication: Mobility status PT Visit Diagnosis: Unsteadiness on feet (R26.81);Muscle weakness (generalized) (M62.81);Pain     Time: 6203-5597 PT Time Calculation (min) (ACUTE ONLY): 29 min  Charges:                        Elna Breslow, SPT  Acute rehab     Elna Breslow 04/18/2020, 4:33 PM

## 2020-04-18 NOTE — Transfer of Care (Signed)
Immediate Anesthesia Transfer of Care Note  Patient: Kimberly Andrade  Procedure(s) Performed: Procedure(s): RIGHT TOTAL KNEE ARTHROPLASTY (Right)  Patient Location: PACU  Anesthesia Type:General  Level of Consciousness:  sedated, patient cooperative and responds to stimulation  Airway & Oxygen Therapy:Patient Spontanous Breathing and Patient connected to face mask oxgen  Post-op Assessment:  Report given to PACU RN and Post -op Vital signs reviewed and stable  Post vital signs:  Reviewed and stable  Last Vitals:  Vitals:   04/18/20 0608  BP: 135/71  Pulse: (!) 57  Resp: 16  Temp: 37.2 C  SpO2: 19%    Complications: No apparent anesthesia complications

## 2020-04-18 NOTE — Evaluation (Signed)
Physical Therapy Evaluation Patient Details Name: Kimberly Andrade MRN: [id number] DOB: 01-03-1946 Today's Date: 04/18/2020   History of Present Illness  patient is a 75 y.o. female s/p Rt TKA on 04/18/2020 with PMHsignificant for osteoporosis, HLD, hypercholesteremia, heart palpitations, GERD, HTN, DJD, diverticulosis, OA, Lt THA (2019), R shoulder surgery, L RTCR/subacromial decompression (2019), R knee arthroscopy (2015).  Clinical Impression  Pt is a 74y.o. female s/p Rt TKA POD 0. Pt reports that she is independent with mobility at baseline. Pt required MIN guard and verbal cues for sit to stand transfers. Pt required MIN guard for ambulation 86f with verbal cues for RW management and step to gait pattern with no LOB or knee buckling. Pt was able to safely perform stair negotiation with MIN assist for RW management and safety and cues for sequencing. Pt reported nausea following stair training and was assisted to wheelchair for seated rest break. Pt's BP at beginning of session was 126/75 in supine and found to be 115/72 while in wheelchair and decreased further to 104/68 after sitting for ~347m. Pt was taken back to room in wheelchair and assisted to supine position where BP increased to 128/71, RN notified. Pt's husband and step son will be available to assist at home. Pt is currently not safe for discharge at this time 2/2 symptomatic hypotension.  Pt will benefit from skilled PT to increase independence and safety with mobility.  Acute therapy to follow up during stay.    Follow Up Recommendations Follow surgeon's recommendation for DC plan and follow-up therapies;Home health PT    Equipment Recommendations   (pt owns RW)    Recommendations for Other Services       Precautions / Restrictions Precautions Precautions: Fall Restrictions Weight Bearing Restrictions: Yes Other Position/Activity Restrictions: WBAT      Mobility  Bed Mobility Overal bed mobility: Needs  Assistance Bed Mobility: Supine to Sit;Sit to Supine     Supine to sit: Supervision;HOB elevated Sit to supine: Min assist   General bed mobility comments: supervision for safety with use of B UEs to scoot to EOB. MIN assist for sit to supine with assist of Rt LE into bed.    Transfers Overall transfer level: Needs assistance Equipment used: Rolling walker (2 wheeled) Transfers: Sit to/from Stand Sit to Stand: Min guard         General transfer comment: MIN guard for safety with cues for safe hand placement.  Ambulation/Gait Ambulation/Gait assistance: Min guard Gait Distance (Feet): 32 Feet Assistive device: Rolling walker (2 wheeled) Gait Pattern/deviations: Step-to pattern;Decreased stride length;Decreased weight shift to right Gait velocity: decr   General Gait Details: Pt able to perform pre gait marching with use of B UEs on RW and min guard from therapist for safety, no knee buckilng observed. MIN guard for safety with cues for RW management and step to gait pattern with no LOB.  Stairs Stairs: Yes Stairs assistance: Min assist Stair Management: No rails;Backwards;With walker Number of Stairs: 3 General stair comments: MIN assist for safety and RW management with cues for sequencing. Pt reported nausea following stair training and was assisted to wheelchair for seated rest break. BP was 126/75 in supine at beginning of session and found to be 115/72 in sitting and decreased further to 104/68 after sitting for ~55m49m Pt was taken back to room in wheelchair and assisted to supine position where BP increased to 128/71, RN notified.  Wheelchair Mobility    Modified Rankin (Stroke Patients Only)  Balance Overall balance assessment: Needs assistance Sitting-balance support: Single extremity supported Sitting balance-Leahy Scale: Good     Standing balance support: Bilateral upper extremity supported;During functional activity Standing balance-Leahy Scale:  Poor Standing balance comment: use of RW to maintain standing balance                             Pertinent Vitals/Pain Pain Assessment: 0-10 Pain Score: 2  Pain Location: Rt knee Pain Descriptors / Indicators: Discomfort;Tender Pain Intervention(s): Limited activity within patient's tolerance;Monitored during session;Repositioned    Home Living Family/patient expects to be discharged to:: Private residence Living Arrangements: Spouse/significant other;Children Available Help at Discharge: Family Type of Home: House Home Access: Stairs to enter Entrance Stairs-Rails: None Entrance Stairs-Number of Steps: 3 Home Layout: One level Home Equipment: Environmental consultant - 2 wheels;Cane - single point;Bedside commode Additional Comments: spouse and step son will be available to assist at home.    Prior Function Level of Independence: Independent               Hand Dominance   Dominant Hand: Right    Extremity/Trunk Assessment   Upper Extremity Assessment Upper Extremity Assessment: Overall WFL for tasks assessed    Lower Extremity Assessment Lower Extremity Assessment: RLE deficits/detail RLE Deficits / Details: good quad set strength, 4/5 B dorsi/plantar flexion strength, and full SLR with no extensor lag noted. RLE Sensation: WNL RLE Coordination: WNL    Cervical / Trunk Assessment Cervical / Trunk Assessment: Normal  Communication   Communication: No difficulties  Cognition Arousal/Alertness: Awake/alert Behavior During Therapy: WFL for tasks assessed/performed Overall Cognitive Status: Within Functional Limits for tasks assessed                                        General Comments      Exercises     Assessment/Plan    PT Assessment Patient needs continued PT services  PT Problem List Decreased strength;Decreased range of motion;Decreased activity tolerance;Decreased balance;Decreased mobility;Decreased knowledge of use of DME;Pain        PT Treatment Interventions DME instruction;Gait training;Stair training;Functional mobility training;Therapeutic activities;Therapeutic exercise;Balance training;Patient/family education    PT Goals (Current goals can be found in the Care Plan section)  Acute Rehab PT Goals Patient Stated Goal: get back to yard work PT Goal Formulation: With patient Time For Goal Achievement: 04/25/20 Potential to Achieve Goals: Good    Frequency 7X/week   Barriers to discharge        Co-evaluation               AM-PAC PT "6 Clicks" Mobility  Outcome Measure Help needed turning from your back to your side while in a flat bed without using bedrails?: None Help needed moving from lying on your back to sitting on the side of a flat bed without using bedrails?: None Help needed moving to and from a bed to a chair (including a wheelchair)?: A Little Help needed standing up from a chair using your arms (e.g., wheelchair or bedside chair)?: A Little Help needed to walk in hospital room?: A Little Help needed climbing 3-5 steps with a railing? : A Little 6 Click Score: 20    End of Session Equipment Utilized During Treatment: Gait belt Activity Tolerance: Treatment limited secondary to medical complications (Comment);Patient tolerated treatment well (symptomatic hypotension) Patient left: in bed;with call bell/phone  within reach Nurse Communication: Mobility status (Bp decrease with mobilty) PT Visit Diagnosis: Unsteadiness on feet (R26.81);Muscle weakness (generalized) (M62.81);Pain    Time: 1220-1256 PT Time Calculation (min) (ACUTE ONLY): 36 min   Charges:              Elna Breslow, SPT  Acute rehab    Elna Breslow 04/18/2020, 2:34 PM

## 2020-04-18 NOTE — Interval H&P Note (Signed)
History and Physical Interval Note:  04/18/2020 7:26 AM  Kimberly Andrade  has presented today for surgery, with the diagnosis of RIGHT KNEE DEGENERATIVE JOINT DISEASE.  The various methods of treatment have been discussed with the patient and family. After consideration of risks, benefits and other options for treatment, the patient has consented to  Procedure(s): RIGHT TOTAL KNEE ARTHROPLASTY (Right) as a surgical intervention.  The patient's history has been reviewed, patient examined, no change in status, stable for surgery.  I have reviewed the patient's chart and labs.  Questions were answered to the patient's satisfaction.     Hessie Dibble

## 2020-04-19 ENCOUNTER — Encounter (HOSPITAL_COMMUNITY): Payer: Self-pay | Admitting: Orthopaedic Surgery

## 2020-04-24 ENCOUNTER — Emergency Department (HOSPITAL_COMMUNITY)
Admission: EM | Admit: 2020-04-24 | Discharge: 2020-04-24 | Disposition: A | Discharge status: Home / Self Care | Payer: Medicare Other | Attending: Emergency Medicine | Admitting: Emergency Medicine

## 2020-04-24 ENCOUNTER — Emergency Department (HOSPITAL_COMMUNITY): Payer: Medicare Other

## 2020-04-24 ENCOUNTER — Other Ambulatory Visit: Payer: Self-pay

## 2020-04-24 ENCOUNTER — Encounter (HOSPITAL_COMMUNITY): Payer: Self-pay | Admitting: Emergency Medicine

## 2020-04-24 DIAGNOSIS — Z87891 Personal history of nicotine dependence: Secondary | ICD-10-CM | POA: Insufficient documentation

## 2020-04-24 DIAGNOSIS — Z7982 Long term (current) use of aspirin: Secondary | ICD-10-CM | POA: Insufficient documentation

## 2020-04-24 DIAGNOSIS — Z79899 Other long term (current) drug therapy: Secondary | ICD-10-CM | POA: Insufficient documentation

## 2020-04-24 DIAGNOSIS — I10 Essential (primary) hypertension: Secondary | ICD-10-CM | POA: Diagnosis not present

## 2020-04-24 DIAGNOSIS — Z96642 Presence of left artificial hip joint: Secondary | ICD-10-CM | POA: Diagnosis not present

## 2020-04-24 DIAGNOSIS — Z96651 Presence of right artificial knee joint: Secondary | ICD-10-CM | POA: Insufficient documentation

## 2020-04-24 DIAGNOSIS — R0602 Shortness of breath: Secondary | ICD-10-CM | POA: Diagnosis present

## 2020-04-24 DIAGNOSIS — Z96611 Presence of right artificial shoulder joint: Secondary | ICD-10-CM | POA: Diagnosis not present

## 2020-04-24 DIAGNOSIS — U071 COVID-19: Secondary | ICD-10-CM | POA: Diagnosis not present

## 2020-04-24 LAB — CBC WITH DIFFERENTIAL/PLATELET
Abs Immature Granulocytes: 0.1 10*3/uL — ABNORMAL HIGH (ref 0.00–0.07)
Basophils Absolute: 0.1 10*3/uL (ref 0.0–0.1)
Basophils Relative: 1 %
Eosinophils Absolute: 0.2 10*3/uL (ref 0.0–0.5)
Eosinophils Relative: 2 %
HCT: 31.9 % — ABNORMAL LOW (ref 36.0–46.0)
Hemoglobin: 10.2 g/dL — ABNORMAL LOW (ref 12.0–15.0)
Immature Granulocytes: 1 %
Lymphocytes Relative: 17 %
Lymphs Abs: 1.2 10*3/uL (ref 0.7–4.0)
MCH: 28.7 pg (ref 26.0–34.0)
MCHC: 32 g/dL (ref 30.0–36.0)
MCV: 89.9 fL (ref 80.0–100.0)
Monocytes Absolute: 0.9 10*3/uL (ref 0.1–1.0)
Monocytes Relative: 13 %
Neutro Abs: 4.6 10*3/uL (ref 1.7–7.7)
Neutrophils Relative %: 66 %
Platelets: 337 10*3/uL (ref 150–400)
RBC: 3.55 MIL/uL — ABNORMAL LOW (ref 3.87–5.11)
RDW: 13.7 % (ref 11.5–15.5)
WBC: 7 10*3/uL (ref 4.0–10.5)
nRBC: 0 % (ref 0.0–0.2)

## 2020-04-24 LAB — COMPREHENSIVE METABOLIC PANEL
ALT: 18 U/L (ref 0–44)
AST: 12 U/L — ABNORMAL LOW (ref 15–41)
Albumin: 3 g/dL — ABNORMAL LOW (ref 3.5–5.0)
Alkaline Phosphatase: 31 U/L — ABNORMAL LOW (ref 38–126)
Anion gap: 7 (ref 5–15)
BUN: 9 mg/dL (ref 8–23)
CO2: 22 mmol/L (ref 22–32)
Calcium: 8.4 mg/dL — ABNORMAL LOW (ref 8.9–10.3)
Chloride: 109 mmol/L (ref 98–111)
Creatinine, Ser: 0.59 mg/dL (ref 0.44–1.00)
GFR, Estimated: >60 mL/min (ref >60)
Glucose, Bld: 100 mg/dL — ABNORMAL HIGH (ref 70–99)
Potassium: 4 mmol/L (ref 3.5–5.1)
Sodium: 138 mmol/L (ref 135–145)
Total Bilirubin: 1.1 mg/dL (ref 0.3–1.2)
Total Protein: 6.4 g/dL — ABNORMAL LOW (ref 6.5–8.1)

## 2020-04-24 LAB — RESP PANEL BY RT-PCR (FLU A&B, COVID) ARPGX2
Influenza A by PCR: NEGATIVE
Influenza B by PCR: NEGATIVE
SARS Coronavirus 2 by RT PCR: POSITIVE — AB

## 2020-04-24 LAB — BRAIN NATRIURETIC PEPTIDE: B Natriuretic Peptide: 392 pg/mL — ABNORMAL HIGH (ref 0.0–100.0)

## 2020-04-24 LAB — TROPONIN I (HIGH SENSITIVITY)
Troponin I (High Sensitivity): 10 ng/L (ref <18)
Troponin I (High Sensitivity): 10 ng/L (ref <18)

## 2020-04-24 LAB — D-DIMER, QUANTITATIVE: D-Dimer, Quant: 11.84 ug/mL-FEU — ABNORMAL HIGH (ref 0.00–0.50)

## 2020-04-24 MED ORDER — NIRMATRELVIR/RITONAVIR (PAXLOVID)TABLET: ORAL_TABLET | ORAL | 0 refills | Status: DC

## 2020-04-24 MED ORDER — IOHEXOL 350 MG/ML SOLN
75.0000 mL | Freq: Once | INTRAVENOUS | Status: AC | PRN
  Administered 2020-04-24: 75 mL via INTRAVENOUS

## 2020-04-24 NOTE — Discharge Instructions (Addendum)
Start the medicine tomorrow and follow-up with your doctor in a couple days for recheck

## 2020-04-24 NOTE — ED Triage Notes (Signed)
Pt c/o of sob since midnight last night. Woke her up from her sleep. Had intermittent sob since.

## 2020-04-24 NOTE — ED Notes (Signed)
Patient transported to CT 

## 2020-04-24 NOTE — ED Provider Notes (Signed)
[facility] EMERGENCY DEPARTMENT Provider Note   CSN: [id number] Arrival date & time: 04/24/20  1622     History Chief Complaint  Patient presents with  . Shortness of Breath    Kimberly Andrade is a 75 y.o. female.  Patient complaining of shortness of breath. Patient recently had knee surgery.  No fever no chills no vomiting  The history is provided by the patient and medical records. No language interpreter was used.  Shortness of Breath Severity:  Moderate Onset quality:  Sudden Timing:  Constant Progression:  Waxing and waning Chronicity:  New Context: activity   Relieved by:  Nothing Worsened by:  Nothing Ineffective treatments:  None tried Associated symptoms: no abdominal pain, no chest pain, no cough, no headaches and no rash        Past Medical History:  Diagnosis Date  . Arthritis   . B12 deficiency   . Crohn disease ([place])   . Crohn's disease ([place])   . Diverticulosis   . DJD (degenerative joint disease)   . DJD (degenerative joint disease)   . Essential hypertension 08/16/2019  . Full dentures   . GERD (gastroesophageal reflux disease)    pt deneis   . Heart palpitations   . Hypercholesterolemia   . Hyperlipidemia   . Incontinence of urine   . Nocturnal leg cramps   . Osteopenia   . Osteoporosis   . Reflux   . Sleep apnea    uses a cpap  . Wears glasses     Patient Active Problem List   Diagnosis Date Noted  . 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 Essential hypertension 08/16/2019  . Mild peripheral edema 07/27/2019  . Morton's neuroma 07/27/2019  . Atherosclerosis of abdominal aorta ([place]) 03/16/2018  . DDD (degenerative disc disease), 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/2017  . Prediabetes 12/25/2016  . Nocturnal leg cramps 12/25/2016  . 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 Urinary incontinence in female 10/16/2016  . Mixed hyperlipidemia 10/05/2015  . GERD (gastroesophageal reflux disease) 04/24/2015  . Crohn's disease of small intestine  without complication ([place]) 79/39/0300  . Obstructive sleep apnea of adult 11/18/2013    Past Surgical History:  Procedure Laterality Date  . ABDOMINAL HYSTERECTOMY    . APPENDECTOMY    . CARPEL TUNNEL    . CHOLECYSTECTOMY    . COLONOSCOPY WITH PROPOFOL N/A 03/11/2016   Procedure: COLONOSCOPY WITH PROPOFOL;  Surgeon: Lollie Sails, MD;  Location: [facility] ENDOSCOPY;  Service: Endoscopy;  Laterality: N/A;  . DILATION AND CURETTAGE OF UTERUS    . ESOPHAGOGASTRODUODENOSCOPY N/A 03/11/2016   Procedure: ESOPHAGOGASTRODUODENOSCOPY (EGD);  Surgeon: Lollie Sails, MD;  Location: [facility] ENDOSCOPY;  Service: Endoscopy;  Laterality: N/A;  . ESOPHAGOGASTRODUODENOSCOPY (EGD) WITH PROPOFOL N/A 11/11/2017   Procedure: ESOPHAGOGASTRODUODENOSCOPY (EGD) WITH PROPOFOL;  Surgeon: Lollie Sails, MD;  Location: [facility] ENDOSCOPY;  Service: Endoscopy;  Laterality: N/A;  . ESOPHAGOGASTRODUODENOSCOPY (EGD) WITH PROPOFOL N/A 01/20/2018   Procedure: ESOPHAGOGASTRODUODENOSCOPY (EGD) WITH PROPOFOL;  Surgeon: Lollie Sails, MD;  Location: [facility] ENDOSCOPY;  Service: Endoscopy;  Laterality: N/A;  . FOOT SURGERY Right   . HAMMER TOE SURGERY    . JOINT REPLACEMENT Right 2006   shoulder  . KNEE ARTHROSCOPY Right 05/25/2013   Procedure: ARTHROSCOPY KNEE;  Surgeon: Hessie Dibble, MD;  Location: [place];  Service: Orthopedics;  Laterality: Right;  medial and lateral meniscal debridment and chondroplasty  . righti shoulder replacement     . ROBOTIC ASSISTED LAPAROSCOPIC REPAIR OF PARAESOPHAGEAL HERNIA  07/16/2018  .  SHOULDER ARTHROSCOPY WITH ROTATOR CUFF REPAIR AND SUBACROMIAL DECOMPRESSION Left 08/14/2017   Procedure: SHOULDER ARTHROSCOPY WITH ROTATOR CUFF REPAIR AND SUBACROMIAL DECOMPRESSION;  Surgeon: Tania Ade, MD;  Location: [place];  Service: Orthopedics;  Laterality: Left;  . SHOULDER SURGERY Right    X 3  . TOTAL HIP ARTHROPLASTY Left 11/25/2017   Procedure: LEFT TOTAL HIP ARTHROPLASTY  ANTERIOR APPROACH;  Surgeon: Melrose Nakayama, MD;  Location: [place];  Service: Orthopedics;  Laterality: Left;  . TOTAL KNEE ARTHROPLASTY Right 04/18/2020   Procedure: RIGHT TOTAL KNEE ARTHROPLASTY;  Surgeon: Melrose Nakayama, MD;  Location: [facility];  Service: Orthopedics;  Laterality: Right;     OB History   No obstetric history on file.     Family History  Problem Relation Age of Onset  . Heart disease Mother   . Esophageal cancer Father     Social History   Tobacco Use  . Smoking status: Former Smoker    Quit date: 12/29/2001    Years since quitting: 18.3  . Smokeless tobacco: Never Used  Vaping Use  . Vaping Use: Never used  Substance Use Topics  . Alcohol use: No  . Drug use: No    Home Medications Prior to Admission medications   Medication Sig Start Date End Date Taking? Authorizing Provider  nirmatrelvir/ritonavir EUA (PAXLOVID) TABS Take nirmatrelvir (150 mg) 2 tablet(s) twice daily for 5 days and ritonavir (100 mg) one tablet twice daily for 5 days. 04/24/20  Yes Milton Ferguson, MD  Ascorbic Acid (VITAMIN C PO) Take 500 mg by mouth daily.    [provider]  aspirin EC 81 MG tablet Take 1 tablet (81 mg total) by mouth 2 (two) times daily after a meal. 04/18/20   Loni Dolly, PA-C  Calcium Carbonate-Vit D-Min (CALCIUM 600+D3 PLUS MINERALS) 600-800 MG-UNIT TABS Take 1 tablet by mouth daily.    [provider]  cholecalciferol (VITAMIN D) 25 MCG (1000 UNIT) tablet Take 1,000 Units by mouth daily.    [provider]  gabapentin (NEURONTIN) 300 MG capsule Take 2 capsules (600 mg total) by mouth 2 (two) times daily. 02/16/20   Glean Hess, MD  hydrochlorothiazide (HYDRODIURIL) 25 MG tablet Take 1 tablet (25 mg total) by mouth daily. 11/11/19   Glean Hess, MD  HYDROcodone-acetaminophen (NORCO/VICODIN) 5-325 MG tablet Take 1-2 tablets by mouth every 6 (six) hours as needed for moderate pain. 04/18/20 04/18/21  Loni Dolly, PA-C  losartan  (COZAAR) 50 MG tablet Take 1 tablet (50 mg total) by mouth daily. 03/17/19   Glean Hess, MD  Magnesium 250 MG TABS Take 250 mg by mouth daily.     [provider]  mesalamine (PENTASA) 500 MG CR capsule Take 2,000 mg by mouth 2 (two) times daily as needed (Crohn's).    [provider]  Multiple Vitamins-Minerals (MULTIVITAMIN WITH MINERALS) tablet Take 1 tablet by mouth daily.    [provider]  NON FORMULARY BiPap nightly    [provider]  oxybutynin (DITROPAN XL) 15 MG 24 hr tablet Take 1 tablet (15 mg total) by mouth at bedtime. 03/28/20   Glean Hess, MD  Potassium 99 MG TABS Take 99 mg by mouth daily.     [provider]  pravastatin (PRAVACHOL) 40 MG tablet Take 40 mg by mouth daily.    [provider]  PROLIA 60 MG/ML SOSY injection Inject 60 mg into the skin every 6 (six) months. 03/19/19   [provider]  promethazine (  PHENERGAN) 12.5 MG tablet Take 1-2 tablets (12.5-25 mg total) by mouth every 6 (six) hours as needed for nausea or vomiting. 04/18/20   Loni Dolly, PA-C  tiZANidine (ZANAFLEX) 4 MG tablet Take 1 tablet (4 mg total) by mouth every 6 (six) hours as needed for muscle spasms. 04/18/20 04/18/21  Loni Dolly, PA-C  vitamin B-12 (CYANOCOBALAMIN) 1000 MCG tablet Take 1,000 mcg by mouth daily.    [provider]  zinc gluconate 50 MG tablet Take 50 mg by mouth daily.    [provider]    Allergies    Fosamax [alendronate sodium], Lipitor [atorvastatin], Codeine, and Hydrocodone  Review of Systems   Review of Systems  Constitutional: Negative for appetite change and fatigue.  HENT: Negative for congestion, ear discharge and sinus pressure.   Eyes: Negative for discharge.  Respiratory: Positive for shortness of breath. Negative for cough.   Cardiovascular: Negative for chest pain.  Gastrointestinal: Negative for abdominal pain and diarrhea.  Genitourinary: Negative for frequency and  hematuria.  Musculoskeletal: Negative for back pain.  Skin: Negative for rash.  Neurological: Negative for seizures and headaches.  Psychiatric/Behavioral: Negative for hallucinations.    Physical Exam Updated Vital Signs BP 132/82   Pulse 69   Temp 99.1 F (37.3 C) (Oral)   Resp 19   Ht 5' 2"  (1.575 m)   Wt 77.1 kg   SpO2 94%   BMI 31.09 kg/m   Physical Exam Vitals and nursing note reviewed.  Constitutional:      Appearance: She is well-developed.  HENT:     Head: Normocephalic.     Nose: Nose normal.  Eyes:     General: No scleral icterus.    Extraocular Movements: EOM normal.     Conjunctiva/sclera: Conjunctivae normal.  Neck:     Thyroid: No thyromegaly.  Cardiovascular:     Rate and Rhythm: Normal rate and regular rhythm.     Heart sounds: No murmur heard. No friction rub. No gallop.   Pulmonary:     Breath sounds: No stridor. No wheezing or rales.  Chest:     Chest wall: No tenderness.  Abdominal:     General: There is no distension.     Tenderness: There is no abdominal tenderness. There is no rebound.  Musculoskeletal:        General: No edema. Normal range of motion.     Cervical back: Neck supple.  Lymphadenopathy:     Cervical: No cervical adenopathy.  Skin:    Findings: No erythema or rash.  Neurological:     Mental Status: She is alert and oriented to person, place, and time.     Motor: No abnormal muscle tone.     Coordination: Coordination normal.  Psychiatric:        Mood and Affect: Mood and affect normal.        Behavior: Behavior normal.     ED Results / Procedures / Treatments   Labs (all labs ordered are listed, but only abnormal results are displayed) Labs Reviewed  RESP PANEL BY RT-PCR (FLU A&B, COVID) ARPGX2 - Abnormal; Notable for the following components:      Result Value   SARS Coronavirus 2 by RT PCR POSITIVE (*)    All other components within normal limits  CBC WITH DIFFERENTIAL/PLATELET - Abnormal; Notable for the  following components:   RBC 3.55 (*)    Hemoglobin 10.2 (*)    HCT 31.9 (*)    Abs Immature Granulocytes 0.10 (*)  All other components within normal limits  COMPREHENSIVE METABOLIC PANEL - Abnormal; Notable for the following components:   Glucose, Bld 100 (*)    Calcium 8.4 (*)    Total Protein 6.4 (*)    Albumin 3.0 (*)    AST 12 (*)    Alkaline Phosphatase 31 (*)    All other components within normal limits  D-DIMER, QUANTITATIVE - Abnormal; Notable for the following components:   D-Dimer, Quant 11.84 (*)    All other components within normal limits  BRAIN NATRIURETIC PEPTIDE - Abnormal; Notable for the following components:   B Natriuretic Peptide 392.0 (*)    All other components within normal limits  TROPONIN I (HIGH SENSITIVITY)  TROPONIN I (HIGH SENSITIVITY)    EKG None  Radiology CT Angio Chest PE W and/or Wo Contrast  Result Date: 04/24/2020 CLINICAL DATA:  Shortness of breath EXAM: CT ANGIOGRAPHY CHEST WITH CONTRAST TECHNIQUE: Multidetector CT imaging of the chest was performed using the standard protocol during bolus administration of intravenous contrast. Multiplanar CT image reconstructions and MIPs were obtained to evaluate the vascular anatomy. CONTRAST:  33m OMNIPAQUE IOHEXOL 350 MG/ML SOLN COMPARISON:  Chest radiograph April 24, 2020; chest CT February 11, 2018. FINDINGS: Cardiovascular: There is no demonstrable pulmonary embolus. There is no appreciable thoracic aortic aneurysm or dissection. There are occasional foci of calcification in visualized great vessels. There are foci of aortic atherosclerosis. There are occasional foci of coronary artery calcification. There is no pericardial effusion or pericardial thickening. Mediastinum/Nodes: Thyroid appears unremarkable. No evident thoracic adenopathy by size criteria. There are occasional subcentimeter mediastinal lymph nodes which do not meet size criteria for pathologic significance. Patient has had previous  paraesophageal hernia repair. A small hiatal type hernia is present currently. Lungs/Pleura: There is an ill-defined ground-glass type opacity in the right middle lobe measuring 1.0 x 0.9 x 0.8 cm, best appreciated on axial slice 72 series 6, coronal slice 55 series 7, and sagittal slice 27 series 8. A slightly better defined ground-glass type opacity is noted in the posterior segment of the right upper lobe measuring 1.5 x 1.3 x 0.9 cm. This opacity is best appreciated on axial slice 45 series 6, coronal slice 1741series 7, and sagittal slice 30 series 8. There are ill-defined areas of patchy airspace opacity in the right lower lobe. There is atelectatic change with mild consolidation in the medial aspect of the left lower lobe. A ground-glass type opacity is noted in the right middle lobe slightly more laterally measuring 0.8 x 0.8 x 0.8 cm, best appreciated on axial slice 81 series 6, coronal slice 65 series 7, and sagittal slice 20 series 8. No pleural effusions are evident. No pneumothorax. Trachea and major bronchial structures are patent. Upper Abdomen: There is mild reflux of contrast into the inferior vena cava and hepatic veins. Visualized upper abdominal structures otherwise appear unremarkable. Musculoskeletal: Total shoulder replacement on the right noted. No blastic or lytic bone lesions. No chest wall lesions. Review of the MIP images confirms the above findings. IMPRESSION: 1. No demonstrable pulmonary embolus. No thoracic aortic aneurysm or dissection. There are foci of aortic atherosclerosis as well as occasional foci of great vessel and coronary artery calcification. 2. Areas of ground-glass opacity as noted as well as areas of ill-defined airspace opacity primarily in the right lower lobe. Consolidation medial left base noted. These findings could all be due to atypical organism pneumonia. Check of COVID-19 status advised. Given the focal nature of the opacities noted above,  a follow-up  noncontrast enhanced study in approximately 4-6 weeks would be advisable to assess for clearing. If these nodules persist, further surveillance will be warranted. 3.  No evident adenopathy. 4. Small hiatal hernia currently present. Status post previous repair of larger paraesophageal hernia. 5. Reflux of contrast into the inferior vena cava and hepatic veins may be indicative of a degree of increase in right heart pressure. Electronically Signed   By: Lowella Grip III [name]   On: 04/24/2020 19:56   DG Chest Port 1 View  Result Date: 04/24/2020 CLINICAL DATA:  Shortness of breath EXAM: PORTABLE CHEST 1 VIEW COMPARISON:  04/13/2020 FINDINGS: Cardiomegaly. Both lungs are clear. The visualized skeletal structures are unremarkable. IMPRESSION: Cardiomegaly without acute abnormality of the lungs in AP portable projection. Electronically Signed   By: Eddie Candle [name]   On: 04/24/2020 17:30    Procedures Procedures   Medications Ordered in ED Medications  iohexol (OMNIPAQUE) 350 MG/ML injection 75 mL (75 mLs Intravenous Contrast Given 04/24/20 1915)    ED Course  I have reviewed the triage vital signs and the nursing notes.  Pertinent labs & imaging results that were available during my care of the patient were reviewed by me and considered in my medical decision making (see chart for details).    MDM Rules/Calculators/A&P                         CT angio shows no PE Patient is Covid positive. She is nontoxic and not hypoxic and will be sent home with [facility] Final Clinical Impression(s) / ED Diagnoses Final diagnoses:  COVID-19    Rx / DC Orders ED Discharge Orders         Ordered    nirmatrelvir/ritonavir EUA (PAXLOVID) TABS        04/24/20 2231           Milton Ferguson, MD 04/25/20 1202

## 2020-04-24 NOTE — ED Notes (Addendum)
Date and time results received: 04/24/20 0957   Test: COVID Critical Value: Positive  Name of Provider Notified: [place]  Orders Received? Or Actions Taken?: NA

## 2020-04-25 ENCOUNTER — Ambulatory Visit: Payer: Self-pay | Admitting: *Deleted

## 2020-04-25 ENCOUNTER — Telehealth: Payer: Self-pay

## 2020-04-25 ENCOUNTER — Telehealth (HOSPITAL_BASED_OUTPATIENT_CLINIC_OR_DEPARTMENT_OTHER): Payer: Self-pay | Admitting: Nurse Practitioner

## 2020-04-25 ENCOUNTER — Encounter (HOSPITAL_BASED_OUTPATIENT_CLINIC_OR_DEPARTMENT_OTHER): Payer: Self-pay | Admitting: Nurse Practitioner

## 2020-04-25 NOTE — Telephone Encounter (Signed)
Called to discuss with patient about COVID-19 symptoms and the use of one of the available treatments for those with mild to moderate Covid symptoms and at a high risk of hospitalization.  Pt appears to qualify for outpatient treatment due to co-morbid conditions and/or a member of an at-risk group in accordance with the FDA Emergency Use Authorization.    Symptom onset: Unknown Vaccinated: Unknown Booster? Unknown Immunocompromised? No Qualifiers: HTN. Crohn's disease  Unable to reach pt - Left message and call back number.   Kimberly Andrade

## 2020-04-25 NOTE — Telephone Encounter (Signed)
Called to discuss with patient about COVID-19 symptoms and the use of one of the available treatments for those with mild to moderate Covid symptoms and at a high risk of hospitalization.  Pt appears to qualify for outpatient treatment due to co-morbid conditions and/or a member of an at-risk group in accordance with the FDA Emergency Use Authorization.    Symptom onset: [place] Vaccinated: Unknown Booster? Unknown Immunocompromised? Unknown Qualifiers: Age, OSA, CVD, HT  Unable to reach pt - It appears that patient did receive oral treatment in the ED yesterday. Based on her immunization status, she may be eligible for infusion treatment, as well. Voicemail with call back number has been provided. Will also send MyChart message.    Orma Render

## 2020-04-25 NOTE — Telephone Encounter (Signed)
We do not prescribe this medication for the patient or administer it to her. I noted from the patients record Dr. Lucilla Lame prescribes this and it was last documented and given 02/11/2020.

## 2020-04-25 NOTE — Telephone Encounter (Signed)
Patient pharmacist, Tye Maryland called for information requesting when the last time patient received prolia injection. Patient's husband was at pharmacy and unable to get in touch with patient to ask when she received injection. Pharmacist reported prior to end of call she was able to find out information if drug interactions would be an issue with oral covid medication paxlovid. No triage or further information needed.   Reason for Disposition . [1] Caller is not with the adult (patient) AND [2] probable NON-URGENT symptoms  Answer Assessment - Initial Assessment Questions 1. REASON FOR CALL or QUESTION: "What is your reason for calling today?" or "How can I best help you?" or "What question do you have that I can help answer?"     Pharmacist at [facility], requesting when the last time a drug , prolia 60 mg injection was given due to possible interaction with new prescribed oral covid medication paxlovid.  Protocols used: INFORMATION ONLY CALL - NO TRIAGE-A-AH

## 2020-04-29 ENCOUNTER — Encounter: Payer: Self-pay | Admitting: Internal Medicine

## 2020-04-30 ENCOUNTER — Other Ambulatory Visit: Payer: Self-pay | Admitting: Internal Medicine

## 2020-04-30 DIAGNOSIS — R32 Unspecified urinary incontinence: Secondary | ICD-10-CM

## 2020-04-30 MED ORDER — OXYBUTYNIN CHLORIDE ER 15 MG PO TB24: 15.0000 mg | ORAL_TABLET | Freq: Every day | ORAL | 3 refills | Status: DC

## 2020-04-30 MED ORDER — OXYBUTYNIN CHLORIDE ER 15 MG PO TB24
15.0000 mg | ORAL_TABLET | Freq: Every day | ORAL | 0 refills | Status: DC
Start: 2020-04-30 — End: 2020-04-30

## 2020-06-23 ENCOUNTER — Encounter: Payer: Self-pay | Admitting: Internal Medicine

## 2020-06-24 ENCOUNTER — Other Ambulatory Visit: Payer: Self-pay | Admitting: Internal Medicine

## 2020-06-24 DIAGNOSIS — I10 Essential (primary) hypertension: Secondary | ICD-10-CM

## 2020-06-24 MED ORDER — LOSARTAN POTASSIUM 50 MG PO TABS: 50.0000 mg | ORAL_TABLET | Freq: Every day | ORAL | 0 refills | Status: DC

## 2020-06-25 ENCOUNTER — Other Ambulatory Visit: Payer: Self-pay | Admitting: Internal Medicine

## 2020-06-25 DIAGNOSIS — I10 Essential (primary) hypertension: Secondary | ICD-10-CM

## 2020-06-25 MED ORDER — LOSARTAN POTASSIUM 50 MG PO TABS
50.0000 mg | ORAL_TABLET | Freq: Every day | ORAL | 0 refills | Status: DC
Start: 2020-06-25 — End: 2020-09-06

## 2020-06-26 ENCOUNTER — Other Ambulatory Visit: Payer: Self-pay

## 2020-06-26 ENCOUNTER — Ambulatory Visit (INDEPENDENT_AMBULATORY_CARE_PROVIDER_SITE_OTHER): Payer: Medicare Other | Admitting: Dermatology

## 2020-06-26 DIAGNOSIS — D2271 Melanocytic nevi of right lower limb, including hip: Secondary | ICD-10-CM

## 2020-06-26 DIAGNOSIS — D229 Melanocytic nevi, unspecified: Secondary | ICD-10-CM

## 2020-06-26 DIAGNOSIS — L820 Inflamed seborrheic keratosis: Secondary | ICD-10-CM | POA: Diagnosis not present

## 2020-06-26 DIAGNOSIS — L918 Other hypertrophic disorders of the skin: Secondary | ICD-10-CM

## 2020-06-26 DIAGNOSIS — Z1283 Encounter for screening for malignant neoplasm of skin: Secondary | ICD-10-CM | POA: Diagnosis not present

## 2020-06-26 DIAGNOSIS — L578 Other skin changes due to chronic exposure to nonionizing radiation: Secondary | ICD-10-CM

## 2020-06-26 DIAGNOSIS — L821 Other seborrheic keratosis: Secondary | ICD-10-CM

## 2020-06-26 DIAGNOSIS — L814 Other melanin hyperpigmentation: Secondary | ICD-10-CM | POA: Diagnosis not present

## 2020-06-26 DIAGNOSIS — D1800 Hemangioma unspecified site: Secondary | ICD-10-CM

## 2020-06-26 NOTE — Patient Instructions (Addendum)
If you have any questions or concerns for your doctor, please call our main line at [phone] and press option 4 to reach your doctor's medical assistant. If no one answers, please leave a voicemail as directed and we will return your call as soon as possible. Messages left after 4 pm will be answered the following business day.   You may also send [place] a message via [place]. We typically respond to MyChart messages within 1-2 business days.  For prescription refills, please ask your pharmacy to contact our office. Our fax number is [phone].  If you have an urgent issue when the clinic is closed that cannot wait until the next business day, you can page your doctor at the number below.    Please note that while we do our best to be available for urgent issues outside of office hours, we are not available 24/7.   If you have an urgent issue and are unable to reach [place], you may choose to seek medical care at your doctor's office, retail clinic, urgent care center, or emergency room.  If you have a medical emergency, please immediately call 911 or go to the emergency department.  Pager Numbers  - Dr. Nehemiah Massed: [phone]  - Dr. Laurence Ferrari: [phone]  - Dr. Nicole Kindred: [phone]  In the event of inclement weather, please call our main line at [phone] for an update on the status of any delays or closures.  Dermatology Medication Tips: Please keep the boxes that topical medications come in in order to help keep track of the instructions about where and how to use these. Pharmacies typically print the medication instructions only on the boxes and not directly on the medication tubes.   If your medication is too expensive, please contact our office at [phone] option 4 or send [place] a message through [place].   We are unable to tell what your co-pay for medications will be in advance as this is different depending on your insurance coverage. However, we may be able to find a substitute  medication at lower cost or fill out paperwork to get insurance to cover a needed medication.   If a prior authorization is required to get your medication covered by your insurance company, please allow [place] 1-2 business days to complete this process.  Drug prices often vary depending on where the prescription is filled and some pharmacies may offer cheaper prices.  The website www.goodrx.com contains coupons for medications through different pharmacies. The prices here do not account for what the cost may be with help from insurance (it may be cheaper with your insurance), but the website can give you the price if you did not use any insurance.  - You can print the associated coupon and take it with your prescription to the pharmacy.  - You may also stop by our office during regular business hours and pick up a GoodRx coupon card.  - If you need your prescription sent electronically to a different pharmacy, notify our office through [facility] or by phone at [phone] option 4.   Cryotherapy Aftercare  . Wash gently with soap and water everyday.   Marland Kitchen Apply Vaseline and Band-Aid daily until healed.

## 2020-06-26 NOTE — Progress Notes (Signed)
Follow-Up Visit   Subjective  Kimberly Andrade is a 75 y.o. female who presents for the following: Total body skin exam (No hx of skin ca) and check spot (Frontal scalp/hairline).  It has grown and is now bothersome.   The following portions of the chart were reviewed this encounter and updated as appropriate:       Review of Systems:  No other skin or systemic complaints except as noted in HPI or Assessment and Plan.  Objective  Well appearing patient in no apparent distress; mood and affect are within normal limits.  A full examination was performed including scalp, head, eyes, ears, nose, lips, neck, chest, axillae, abdomen, back, buttocks, bilateral upper extremities, bilateral lower extremities, hands, feet, fingers, toes, fingernails, and toenails. All findings within normal limits unless otherwise noted below.  Objective  central upper forehead: Erythematous keratotic or waxy stuck-on plaque.   Objective  Right calf: 6.0 x 4.82m pink brown pap darker inferior- present for years, no changes noted   Assessment & Plan  Inflamed seborrheic keratosis central upper forehead  Prior to procedure, discussed risks of blister formation, small wound, skin dyspigmentation, or rare scar following cryotherapy.    May take additional treatment to clear due to size  Destruction of lesion - central upper forehead  Destruction method: cryotherapy   Informed consent: discussed and consent obtained   Lesion destroyed using liquid nitrogen: Yes   Region frozen until ice ball extended beyond lesion: Yes   Outcome: patient tolerated procedure well with no complications   Post-procedure details: wound care instructions given    Nevus Right calf  Benign-appearing.  Observation.  Call clinic for new or changing moles.  Recommend daily use of broad spectrum spf 30+ sunscreen to sun-exposed areas.     Lentigines - Scattered tan macules - Due to sun exposure - Benign-appering,  observe - Recommend daily broad spectrum sunscreen SPF 30+ to sun-exposed areas, reapply every 2 hours as needed. - Call for any changes  Seborrheic Keratoses - Stuck-on, waxy, tan-brown papules and/or plaques  - Benign-appearing - Discussed benign etiology and prognosis. - Observe - Call for any changes  Melanocytic Nevi - Tan-brown and/or pink-flesh-colored symmetric macules and papules - Benign appearing on exam today - Observation - Call clinic for new or changing moles - Recommend daily use of broad spectrum spf 30+ sunscreen to sun-exposed areas.   Hemangiomas - Red papules - Discussed benign nature - Observe - Call for any changes  Actinic Damage - Chronic condition, secondary to cumulative UV/sun exposure - diffuse scaly erythematous macules with underlying dyspigmentation - Recommend daily broad spectrum sunscreen SPF 30+ to sun-exposed areas, reapply every 2 hours as needed.  - Staying in the shade or wearing long sleeves, sun glasses (UVA+UVB protection) and wide brim hats (4-inch brim around the entire circumference of the hat) are also recommended for sun protection.  - Call for new or changing lesions.  Skin cancer screening performed today.  Acrochordons (Skin Tags) - Fleshy, skin-colored pedunculated papules - Benign appearing.  - Observe. - If desired, they can be removed with an in office procedure that is not covered by insurance. - Please call the clinic if you notice any new or changing lesions.  Return in about 1 year (around 06/26/2021) for TBSE.   I, SOthelia Pulling RMA, am acting as scribe for TBrendolyn Patty MD . Documentation: I have reviewed the above documentation for accuracy and completeness, and I agree with the above.  TBrendolyn Patty  MD

## 2020-07-13 ENCOUNTER — Other Ambulatory Visit: Payer: Self-pay | Admitting: Specialist

## 2020-07-13 DIAGNOSIS — R918 Other nonspecific abnormal finding of lung field: Secondary | ICD-10-CM

## 2020-07-13 DIAGNOSIS — J849 Interstitial pulmonary disease, unspecified: Secondary | ICD-10-CM

## 2020-07-28 ENCOUNTER — Other Ambulatory Visit: Payer: Self-pay

## 2020-07-28 ENCOUNTER — Ambulatory Visit
Admission: RE | Admit: 2020-07-28 | Discharge: 2020-07-28 | Disposition: A | Discharge status: Home / Self Care | Payer: Medicare Other | Source: Ambulatory Visit | Attending: Specialist | Admitting: Specialist

## 2020-07-28 DIAGNOSIS — J849 Interstitial pulmonary disease, unspecified: Secondary | ICD-10-CM | POA: Diagnosis present

## 2020-07-28 DIAGNOSIS — R918 Other nonspecific abnormal finding of lung field: Secondary | ICD-10-CM | POA: Diagnosis present

## 2020-08-07 ENCOUNTER — Other Ambulatory Visit: Payer: Self-pay | Admitting: Internal Medicine

## 2020-08-07 DIAGNOSIS — Z1231 Encounter for screening mammogram for malignant neoplasm of breast: Secondary | ICD-10-CM

## 2020-08-25 ENCOUNTER — Other Ambulatory Visit: Payer: Self-pay

## 2020-08-25 ENCOUNTER — Encounter: Payer: Self-pay | Admitting: Internal Medicine

## 2020-08-25 ENCOUNTER — Ambulatory Visit (INDEPENDENT_AMBULATORY_CARE_PROVIDER_SITE_OTHER): Payer: Medicare Other | Admitting: Internal Medicine

## 2020-08-25 VITALS — BP 122/78 | HR 83 | Ht 62.0 in | Wt 173.0 lb

## 2020-08-25 DIAGNOSIS — Z23 Encounter for immunization: Secondary | ICD-10-CM

## 2020-08-25 DIAGNOSIS — R600 Localized edema: Secondary | ICD-10-CM | POA: Diagnosis not present

## 2020-08-25 DIAGNOSIS — I10 Essential (primary) hypertension: Secondary | ICD-10-CM

## 2020-08-25 DIAGNOSIS — R7303 Prediabetes: Secondary | ICD-10-CM

## 2020-08-25 DIAGNOSIS — K5000 Crohn's disease of small intestine without complications: Secondary | ICD-10-CM

## 2020-08-25 DIAGNOSIS — I700 Atherosclerosis of aorta: Secondary | ICD-10-CM

## 2020-08-25 MED ORDER — SHINGRIX 50 MCG/0.5ML IM SUSR: 0.5000 mL | Freq: Once | INTRAMUSCULAR | 1 refills | Status: AC

## 2020-08-25 NOTE — Progress Notes (Signed)
Date:  08/25/2020   Name:  Kimberly Andrade   DOB:  07/11/1945   MRN:  [id number]   Chief Complaint: Joint Swelling (Feet and legs. Been going on since last year. Fluid pill is not helping as much as it did when she started. )  Edema - Seen last year with mild ankle and foot edema, worse at the end of the day, improves with elevation. She was given HCTZ to take as needed which has helped.  Over the past 2 months, however, the swelling has not responded to PRN HCTZ.  Hypertension This is a chronic problem. The problem is controlled. Pertinent negatives include no chest pain, headaches, palpitations or shortness of breath. Past treatments include angiotensin blockers and diuretics. The current treatment provides significant improvement. There is no history of kidney disease, CAD/MI or CVA.  Hyperlipidemia This is a chronic problem. The problem is controlled. Pertinent negatives include no chest pain or shortness of breath. Current antihyperlipidemic treatment includes statins. Risk factors: has aortic atherosclerosis.  Crohn's disease - followed by GI, on mesalamine.  No visit since last year.  Sx are well controlled, no diarrhea, abdominal pain or bleeding.  Lab Results  Component Value Date   CREATININE 0.59 04/24/2020   BUN 9 04/24/2020   NA 138 04/24/2020   K 4.0 04/24/2020   CL 109 04/24/2020   CO2 22 04/24/2020   Lab Results  Component Value Date   CHOL 160 02/16/2020   HDL 75 02/16/2020   LDLCALC 64 02/16/2020   TRIG 121 02/16/2020   CHOLHDL 2.1 02/16/2020   Lab Results  Component Value Date   TSH 2.560 02/16/2020   Lab Results  Component Value Date   HGBA1C 6.3 (H) 02/16/2020   Lab Results  Component Value Date   WBC 7.0 04/24/2020   HGB 10.2 (L) 04/24/2020   HCT 31.9 (L) 04/24/2020   MCV 89.9 04/24/2020   PLT 337 04/24/2020   Lab Results  Component Value Date   ALT 18 04/24/2020   AST 12 (L) 04/24/2020   ALKPHOS 31 (L) 04/24/2020   BILITOT 1.1  04/24/2020     Review of Systems  Constitutional:  Negative for appetite change, fatigue, fever and unexpected weight change.  HENT:  Negative for tinnitus and trouble swallowing.   Eyes:  Negative for visual disturbance.  Respiratory:  Negative for cough, chest tightness and shortness of breath.   Cardiovascular:  Positive for leg swelling. Negative for chest pain and palpitations.  Gastrointestinal:  Negative for abdominal pain, blood in stool, constipation and diarrhea.  Endocrine: Negative for polydipsia and polyuria.  Genitourinary:  Negative for dysuria and hematuria.  Musculoskeletal:  Negative for arthralgias.  Neurological:  Negative for tremors, numbness and headaches.  Psychiatric/Behavioral:  Negative for dysphoric mood.    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/27/2019   Atherosclerosis of abdominal aorta ([place]) 03/16/2018   DDD (degenerative disc disease), 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/2017   Prediabetes 12/25/2016   Nocturnal leg cramps 12/25/2016   Postmenopausal osteoporosis 10/16/2016   Urinary incontinence in female 10/16/2016   Mixed hyperlipidemia 10/05/2015   GERD (gastroesophageal reflux disease) 04/24/2015   Crohn's disease of small intestine without complication ([place]) 89/37/3428   Obstructive sleep apnea of adult 11/18/2013    Allergies  Allergen Reactions   Fosamax [Alendronate Sodium] Other (See Comments)    Bones  hurt   Lipitor [Atorvastatin] Other (See Comments)    Bone pain   Codeine Other (See Comments)     Codeine in the liquid form causes gastritis   Hydrocodone Nausea And Vomiting    Past Surgical History:  Procedure Laterality Date   ABDOMINAL HYSTERECTOMY     APPENDECTOMY     CARPEL TUNNEL     CHOLECYSTECTOMY     COLONOSCOPY WITH PROPOFOL N/A 03/11/2016   Procedure: COLONOSCOPY WITH PROPOFOL;  Surgeon:  Lollie Sails, MD;  Location: [facility] ENDOSCOPY;  Service: Endoscopy;  Laterality: N/A;   DILATION AND CURETTAGE OF UTERUS     ESOPHAGOGASTRODUODENOSCOPY N/A 03/11/2016   Procedure: ESOPHAGOGASTRODUODENOSCOPY (EGD);  Surgeon: Lollie Sails, MD;  Location: [facility] ENDOSCOPY;  Service: Endoscopy;  Laterality: N/A;   ESOPHAGOGASTRODUODENOSCOPY (EGD) WITH PROPOFOL N/A 11/11/2017   Procedure: ESOPHAGOGASTRODUODENOSCOPY (EGD) WITH PROPOFOL;  Surgeon: Lollie Sails, MD;  Location: [facility] ENDOSCOPY;  Service: Endoscopy;  Laterality: N/A;   ESOPHAGOGASTRODUODENOSCOPY (EGD) WITH PROPOFOL N/A 01/20/2018   Procedure: ESOPHAGOGASTRODUODENOSCOPY (EGD) WITH PROPOFOL;  Surgeon: Lollie Sails, MD;  Location: [facility] ENDOSCOPY;  Service: Endoscopy;  Laterality: N/A;   FOOT SURGERY Right    HAMMER TOE SURGERY     JOINT REPLACEMENT Right 2006   shoulder   KNEE ARTHROSCOPY Right 05/25/2013   Procedure: ARTHROSCOPY KNEE;  Surgeon: Hessie Dibble, MD;  Location: [place];  Service: Orthopedics;  Laterality: Right;  medial and lateral meniscal debridment and chondroplasty   righti shoulder replacement      ROBOTIC ASSISTED LAPAROSCOPIC REPAIR OF PARAESOPHAGEAL HERNIA  07/16/2018   SHOULDER ARTHROSCOPY WITH ROTATOR CUFF REPAIR AND SUBACROMIAL DECOMPRESSION Left 08/14/2017   Procedure: SHOULDER ARTHROSCOPY WITH ROTATOR CUFF REPAIR AND SUBACROMIAL DECOMPRESSION;  Surgeon: Tania Ade, MD;  Location: [place];  Service: Orthopedics;  Laterality: Left;   SHOULDER SURGERY Right    X 3   TOTAL HIP ARTHROPLASTY Left 11/25/2017   Procedure: LEFT TOTAL HIP ARTHROPLASTY ANTERIOR APPROACH;  Surgeon: Melrose Nakayama, MD;  Location: [place];  Service: Orthopedics;  Laterality: Left;   TOTAL KNEE ARTHROPLASTY Right 04/18/2020   Procedure: RIGHT TOTAL KNEE ARTHROPLASTY;  Surgeon: Melrose Nakayama, MD;  Location: [facility];  Service: Orthopedics;  Laterality: Right;    Social History   Tobacco Use   Smoking  status: Former    Pack years: 0.00    Types: Cigarettes    Quit date: 12/29/2001    Years since quitting: 18.6   Smokeless tobacco: Never  Vaping Use   Vaping Use: Never used  Substance Use Topics   Alcohol use: No   Drug use: No     Medication list has been reviewed and updated.  Current Meds  Medication Sig   Ascorbic Acid (VITAMIN C PO) Take 500 mg by mouth daily.   aspirin EC 81 MG tablet Take 1 tablet (81 mg total) by mouth 2 (two) times daily after a meal.   Calcium Carbonate-Vit D-Min (CALCIUM 600+D3 PLUS MINERALS) 600-800 MG-UNIT TABS Take 1 tablet by mouth daily.   cholecalciferol (VITAMIN D) 25 MCG (1000 UNIT) tablet Take 1,000 Units by mouth daily.   gabapentin (NEURONTIN) 300 MG capsule Take by mouth.   hydrochlorothiazide (HYDRODIURIL) 25 MG tablet Take 1 tablet (25 mg total) by mouth daily.   HYDROcodone-acetaminophen (NORCO/VICODIN) 5-325 MG tablet Take 1-2 tablets by mouth every 6 (six) hours as needed for moderate pain.   losartan (COZAAR) 50 MG tablet Take 1 tablet (50 mg total) by mouth daily.  Magnesium 250 MG TABS Take 250 mg by mouth daily.    mesalamine (PENTASA) 500 MG CR capsule Take 2,000 mg by mouth 2 (two) times daily as needed (Crohn's).   Multiple Vitamins-Minerals (MULTIVITAMIN WITH MINERALS) tablet Take 1 tablet by mouth daily.   NON FORMULARY BiPap nightly   oxybutynin (DITROPAN XL) 15 MG 24 hr tablet Take 1 tablet (15 mg total) by mouth at bedtime.   oxybutynin (DITROPAN) 5 MG tablet Take by mouth.   Potassium 99 MG TABS Take 99 mg by mouth daily.    pravastatin (PRAVACHOL) 40 MG tablet Take 40 mg by mouth daily.   PROLIA 60 MG/ML SOSY injection Inject 60 mg into the skin every 6 (six) months.   promethazine (PHENERGAN) 12.5 MG tablet Take 1-2 tablets (12.5-25 mg total) by mouth every 6 (six) hours as needed for nausea or vomiting.   tiZANidine (ZANAFLEX) 4 MG tablet Take 1 tablet (4 mg total) by mouth every 6 (six) hours as needed for muscle  spasms.   vitamin B-12 (CYANOCOBALAMIN) 1000 MCG tablet Take 1,000 mcg by mouth daily.   zinc gluconate 50 MG tablet Take 50 mg by mouth daily.    PHQ 2/9 Scores 04/03/2020 02/16/2020 08/16/2019 07/27/2019  PHQ - 2 Score 0 0 1 0  PHQ- 9 Score - 3 3 0    GAD 7 : Generalized Anxiety Score 02/16/2020 08/16/2019 07/27/2019  Nervous, Anxious, on Edge 0 1 0  Control/stop worrying 0 1 0  Worry too much - different things 0 1 0  Trouble relaxing 0 0 0  Restless 0 0 0  Easily annoyed or irritable 0 0 0  Afraid - awful might happen 0 0 0  Total GAD 7 Score 0 3 0  Anxiety Difficulty - Not difficult at all Not difficult at all    BP Readings from Last 3 Encounters:  08/25/20 122/78  04/24/20 124/84  04/18/20 130/79    Physical Exam Vitals and nursing note reviewed.  Constitutional:      General: She is not in acute distress.    Appearance: She is well-developed.  HENT:     Head: Normocephalic and atraumatic.  Cardiovascular:     Rate and Rhythm: Normal rate and regular rhythm.     Pulses: Normal pulses.     Heart sounds: No murmur heard. Pulmonary:     Effort: Pulmonary effort is normal. No respiratory distress.     Breath sounds: No wheezing or rhonchi.  Musculoskeletal:     Cervical back: Normal range of motion.     Right lower leg: Edema (1+ at ankle) present.     Left lower leg: Edema (trace) present.     Comments: No obvious varicose veins,  no warmth, no cord or tenderness. Right slightly worse than the left  Skin:    General: Skin is warm and dry.     Capillary Refill: Capillary refill takes less than 2 seconds.     Findings: No rash.  Neurological:     General: No focal deficit present.     Mental Status: She is alert and oriented to person, place, and time.  Psychiatric:        Mood and Affect: Mood normal.        Behavior: Behavior normal.    Wt Readings from Last 3 Encounters:  08/25/20 173 lb (78.5 kg)  04/24/20 170 lb (77.1 kg)  04/18/20 170 lb (77.1 kg)     BP 122/78 (BP Location: Right Arm, Patient  Position: Sitting, Cuff Size: Normal)   Pulse 83   Ht 5' 2"  (1.575 m)   Wt 173 lb (78.5 kg)   SpO2 93%   BMI 31.64 kg/m   Assessment and Plan: 1. Essential hypertension Clinically stable exam with well controlled BP. Tolerating medications without side effects at this time. Pt to continue current regimen and low sodium diet; benefits of regular exercise as able discussed.  2. Prediabetes Continue diet; will recheck next visit  3. Mild peripheral edema Continue HCTZ Refer to VS - Ambulatory referral to Vascular Surgery  4. Need for shingles vaccine Written Rx given  5. Crohn's disease of small intestine without complication ([place]) Currently on treatment from GI  6. Atherosclerosis of abdominal aorta ([place]) On statin and ASA therapy   Partially dictated using Editor, commissioning. Any errors are unintentional.  Halina Maidens, MD [facility]  08/25/2020

## 2020-09-06 ENCOUNTER — Other Ambulatory Visit: Payer: Self-pay

## 2020-09-06 ENCOUNTER — Encounter: Payer: Self-pay | Admitting: Internal Medicine

## 2020-09-06 DIAGNOSIS — I10 Essential (primary) hypertension: Secondary | ICD-10-CM

## 2020-09-06 MED ORDER — LOSARTAN POTASSIUM 50 MG PO TABS: 50.0000 mg | ORAL_TABLET | Freq: Every day | ORAL | 1 refills | Status: DC

## 2020-09-06 MED ORDER — PRAVASTATIN SODIUM 40 MG PO TABS: 40.0000 mg | ORAL_TABLET | Freq: Every day | ORAL | 1 refills | Status: DC

## 2020-09-07 ENCOUNTER — Ambulatory Visit (INDEPENDENT_AMBULATORY_CARE_PROVIDER_SITE_OTHER): Payer: Medicare Other | Admitting: Nurse Practitioner

## 2020-09-07 ENCOUNTER — Other Ambulatory Visit: Payer: Self-pay

## 2020-09-07 VITALS — BP 130/76 | HR 62 | Resp 16 | Ht 62.0 in | Wt 175.0 lb

## 2020-09-07 DIAGNOSIS — I10 Essential (primary) hypertension: Secondary | ICD-10-CM

## 2020-09-07 DIAGNOSIS — E782 Mixed hyperlipidemia: Secondary | ICD-10-CM | POA: Diagnosis not present

## 2020-09-07 DIAGNOSIS — R600 Localized edema: Secondary | ICD-10-CM

## 2020-09-10 ENCOUNTER — Encounter (INDEPENDENT_AMBULATORY_CARE_PROVIDER_SITE_OTHER): Payer: Self-pay | Admitting: Nurse Practitioner

## 2020-09-10 NOTE — Progress Notes (Signed)
Subjective:    Patient ID: Kimberly Andrade, female    DOB: [birth year], 75 y.o.   MRN: [id number] Chief Complaint  Patient presents with   New Patient (Initial Visit)    Mild edema    Patient is seen for evaluation of leg swelling. The patient first noticed the swelling remotely but is now concerned because of a significant increase in the overall edema.  Initially hydrochlorothiazide was helpful with controlling the swelling however now it does not seem to be useful at all.  She does note that elevation does help with the legs independency tends to make the much worse.  There is no history of ulcerations associated with the swelling.   The patient denies any recent changes in their medications.  The patient has no had any past angiography, interventions or vascular surgery.  The patient denies a history of DVT or PE. There is no prior history of phlebitis. There is no history of primary lymphedema.  There is no history of radiation treatment to the groin or pelvis No history of malignancies. No history of trauma or groin or pelvic surgery. No history of foreign travel or parasitic infections area      Review of Systems  Cardiovascular:  Positive for leg swelling.  All other systems reviewed and are negative.     Objective:   Physical Exam Vitals reviewed.  HENT:     Head: Normocephalic.  Cardiovascular:     Rate and Rhythm: Normal rate.     Pulses: Normal pulses.  Pulmonary:     Effort: Pulmonary effort is normal.  Musculoskeletal:     Right lower leg: 1+ Edema present.     Left lower leg: 1+ Edema present.  Neurological:     Mental Status: She is alert and oriented to person, place, and time.  Psychiatric:        Mood and Affect: Mood normal.        Behavior: Behavior normal.        Thought Content: Thought content normal.        Judgment: Judgment normal.    BP 130/76 (BP Location: Right Arm)   Pulse 62   Resp 16   Ht 5' 2"  (1.575 m)   Wt 175 lb  (79.4 kg)   BMI 32.01 kg/m   Past Medical History:  Diagnosis Date   Arthritis    B12 deficiency    Crohn disease (HCC)    Crohn's disease (HCC)    Diverticulosis    DJD (degenerative joint disease)    DJD (degenerative joint disease)    Essential hypertension 08/16/2019   Full dentures    GERD (gastroesophageal reflux disease)    pt deneis    Heart palpitations    Hypercholesterolemia    Hyperlipidemia    Incontinence of urine    Nocturnal leg cramps    Osteopenia    Osteoporosis    Reflux    Sleep apnea    uses a cpap   Wears glasses     Social History   Socioeconomic History   Marital status: Married    Spouse name: Not on file   Number of children: 3   Years of education: Not on file   Highest education level: Associate degree: academic program  Occupational History   Occupation: retired  Tobacco Use   Smoking status: Former    Pack years: 0.00    Types: Cigarettes    Quit date: 12/29/2001    Years since quitting:  18.7   Smokeless tobacco: Never  Vaping Use   Vaping Use: Never used  Substance and Sexual Activity   Alcohol use: No   Drug use: No   Sexual activity: Not on file  Other Topics Concern   Not on file  Social History Narrative   Not on file   Social Determinants of Health   Financial Resource Strain: Low Risk    Difficulty of Paying Living Expenses: Not hard at all  Food Insecurity: No Food Insecurity   Worried About Charity fundraiser in the Last Year: Never true   [place] in the Last Year: Never true  Transportation Needs: No Transportation Needs   Lack of Transportation (Medical): No   Lack of Transportation (Non-Medical): No  Physical Activity: Inactive   Days of Exercise per Week: 0 days   Minutes of Exercise per Session: 0 min  Stress: No Stress Concern Present   Feeling of Stress : Not at all  Social Connections: Moderately Integrated   Frequency of Communication with Friends and Family: More than three times a week    Frequency of Social Gatherings with Friends and Family: More than three times a week   Attends Religious Services: More than 4 times per year   Active Member of Clubs or Organizations: No   Attends Archivist Meetings: Never   Marital Status: Married  Human resources officer Violence: Not At Risk   Fear of Current or Ex-Partner: No   Emotionally Abused: No   Physically Abused: No   Sexually Abused: No    Past Surgical History:  Procedure Laterality Date   ABDOMINAL HYSTERECTOMY     APPENDECTOMY     CARPEL TUNNEL     CHOLECYSTECTOMY     COLONOSCOPY WITH PROPOFOL N/A 03/11/2016   Procedure: COLONOSCOPY WITH PROPOFOL;  Surgeon: Lollie Sails, MD;  Location: [facility] ENDOSCOPY;  Service: Endoscopy;  Laterality: N/A;   DILATION AND CURETTAGE OF UTERUS     ESOPHAGOGASTRODUODENOSCOPY N/A 03/11/2016   Procedure: ESOPHAGOGASTRODUODENOSCOPY (EGD);  Surgeon: Lollie Sails, MD;  Location: [facility] ENDOSCOPY;  Service: Endoscopy;  Laterality: N/A;   ESOPHAGOGASTRODUODENOSCOPY (EGD) WITH PROPOFOL N/A 11/11/2017   Procedure: ESOPHAGOGASTRODUODENOSCOPY (EGD) WITH PROPOFOL;  Surgeon: Lollie Sails, MD;  Location: [facility] ENDOSCOPY;  Service: Endoscopy;  Laterality: N/A;   ESOPHAGOGASTRODUODENOSCOPY (EGD) WITH PROPOFOL N/A 01/20/2018   Procedure: ESOPHAGOGASTRODUODENOSCOPY (EGD) WITH PROPOFOL;  Surgeon: Lollie Sails, MD;  Location: [facility] ENDOSCOPY;  Service: Endoscopy;  Laterality: N/A;   FOOT SURGERY Right    HAMMER TOE SURGERY     JOINT REPLACEMENT Right 2006   shoulder   KNEE ARTHROSCOPY Right 05/25/2013   Procedure: ARTHROSCOPY KNEE;  Surgeon: Hessie Dibble, MD;  Location: [place];  Service: Orthopedics;  Laterality: Right;  medial and lateral meniscal debridment and chondroplasty   righti shoulder replacement      ROBOTIC ASSISTED LAPAROSCOPIC REPAIR OF PARAESOPHAGEAL HERNIA  07/16/2018   SHOULDER ARTHROSCOPY WITH ROTATOR CUFF REPAIR AND SUBACROMIAL DECOMPRESSION  Left 08/14/2017   Procedure: SHOULDER ARTHROSCOPY WITH ROTATOR CUFF REPAIR AND SUBACROMIAL DECOMPRESSION;  Surgeon: Tania Ade, MD;  Location: [place];  Service: Orthopedics;  Laterality: Left;   SHOULDER SURGERY Right    X 3   TOTAL HIP ARTHROPLASTY Left 11/25/2017   Procedure: LEFT TOTAL HIP ARTHROPLASTY ANTERIOR APPROACH;  Surgeon: Melrose Nakayama, MD;  Location: [place];  Service: Orthopedics;  Laterality: Left;   TOTAL KNEE ARTHROPLASTY Right 04/18/2020   Procedure: RIGHT TOTAL KNEE  ARTHROPLASTY;  Surgeon: Melrose Nakayama, MD;  Location: [facility];  Service: Orthopedics;  Laterality: Right;    Family History  Problem Relation Age of Onset   Heart disease Mother    Esophageal cancer Father     Allergies  Allergen Reactions   Fosamax [Alendronate Sodium] Other (See Comments)    Bones hurt   Lipitor [Atorvastatin] Other (See Comments)    Bone pain   Codeine Other (See Comments)     Codeine in the liquid form causes gastritis   Hydrocodone Nausea And Vomiting    CBC Latest Ref Rng & Units 04/24/2020 04/13/2020 02/16/2020  WBC 4.0 - 10.5 K/uL 7.0 4.9 6.5  Hemoglobin 12.0 - 15.0 g/dL 10.2(L) 14.4 13.1  Hematocrit 36.0 - 46.0 % 31.9(L) 44.8 39.6  Platelets 150 - 400 K/uL 337 189 237      CMP     Component Value Date/Time   NA 138 04/24/2020 1730   NA 142 02/16/2020 1017   K 4.0 04/24/2020 1730   CL 109 04/24/2020 1730   CO2 22 04/24/2020 1730   GLUCOSE 100 (H) 04/24/2020 1730   BUN 9 04/24/2020 1730   BUN 16 02/16/2020 1017   CREATININE 0.59 04/24/2020 1730   CALCIUM 8.4 (L) 04/24/2020 1730   PROT 6.4 (L) 04/24/2020 1730   PROT 6.1 02/16/2020 1017   ALBUMIN 3.0 (L) 04/24/2020 1730   ALBUMIN 4.2 02/16/2020 1017   AST 12 (L) 04/24/2020 1730   ALT 18 04/24/2020 1730   ALKPHOS 31 (L) 04/24/2020 1730   BILITOT 1.1 04/24/2020 1730   BILITOT 0.6 02/16/2020 1017   GFRNONAA >60 04/24/2020 1730   GFRAA 76 02/16/2020 1017     No results found.     Assessment & Plan:    1. Mild peripheral edema I have had a long discussion with the patient regarding swelling and why it  causes symptoms.  Patient will begin wearing graduated compression stockings class 1 (20-30 mmHg) on a daily basis a prescription was given. The patient will  beginning wearing the stockings first thing in the morning and removing them in the evening. The patient is instructed specifically not to sleep in the stockings.   In addition, behavioral modification will be initiated.  This will include frequent elevation, use of over the counter pain medications and exercise such as walking.  I have reviewed systemic causes for chronic edema such as liver, kidney and cardiac etiologies.  The patient denies problems with these organ systems.    Consideration for a lymph pump will also be made based upon the effectiveness of conservative therapy.  This would help to improve the edema control and prevent sequela such as ulcers and infections   Patient should undergo duplex ultrasound of the venous system to ensure that DVT or reflux is not present.  The patient will follow-up with me after the ultrasound.    2. Essential hypertension Continue antihypertensive medications as already ordered, these medications have been reviewed and there are no changes at this time.   3. Mixed hyperlipidemia Continue statin as ordered and reviewed, no changes at this time    Current Outpatient Medications on File Prior to Visit  Medication Sig Dispense Refill   Ascorbic Acid (VITAMIN C PO) Take 500 mg by mouth daily.     aspirin EC 81 MG tablet Take 1 tablet (81 mg total) by mouth 2 (two) times daily after a meal. 30 tablet 0   Calcium Carbonate-Vit D-Min (CALCIUM 600+D3 PLUS MINERALS) 600-800  MG-UNIT TABS Take 1 tablet by mouth daily.     cholecalciferol (VITAMIN D) 25 MCG (1000 UNIT) tablet Take 1,000 Units by mouth daily.     gabapentin (NEURONTIN) 300 MG capsule Take by mouth.     hydrochlorothiazide  (HYDRODIURIL) 25 MG tablet Take 1 tablet (25 mg total) by mouth daily. 30 tablet 0   losartan (COZAAR) 50 MG tablet Take 1 tablet (50 mg total) by mouth daily. 90 tablet 1   Magnesium 250 MG TABS Take 250 mg by mouth daily.      mesalamine (PENTASA) 500 MG CR capsule Take 2,000 mg by mouth 2 (two) times daily as needed (Crohn's).     Multiple Vitamins-Minerals (MULTIVITAMIN WITH MINERALS) tablet Take 1 tablet by mouth daily.     NON FORMULARY BiPap nightly     oxybutynin (DITROPAN XL) 15 MG 24 hr tablet Take 1 tablet (15 mg total) by mouth at bedtime. 90 tablet 3   Potassium 99 MG TABS Take 99 mg by mouth daily.      pravastatin (PRAVACHOL) 40 MG tablet Take 1 tablet (40 mg total) by mouth daily. 90 tablet 1   PROLIA 60 MG/ML SOSY injection Inject 60 mg into the skin every 6 (six) months.     vitamin B-12 (CYANOCOBALAMIN) 1000 MCG tablet Take 1,000 mcg by mouth daily.     zinc gluconate 50 MG tablet Take 50 mg by mouth daily.     HYDROcodone-acetaminophen (NORCO/VICODIN) 5-325 MG tablet Take 1-2 tablets by mouth every 6 (six) hours as needed for moderate pain. (Patient not taking: Reported on 09/07/2020) 20 tablet 0   oxybutynin (DITROPAN) 5 MG tablet Take by mouth. (Patient not taking: Reported on 09/07/2020)     promethazine (PHENERGAN) 12.5 MG tablet Take 1-2 tablets (12.5-25 mg total) by mouth every 6 (six) hours as needed for nausea or vomiting. (Patient not taking: Reported on 09/07/2020) 40 tablet 0   tiZANidine (ZANAFLEX) 4 MG tablet Take 1 tablet (4 mg total) by mouth every 6 (six) hours as needed for muscle spasms. (Patient not taking: Reported on 09/07/2020) 40 tablet 1   No current facility-administered medications on file prior to visit.    There are no Patient Instructions on file for this visit. No follow-ups on file.   Kris Hartmann, NP

## 2020-09-25 ENCOUNTER — Ambulatory Visit
Admission: RE | Admit: 2020-09-25 | Discharge: 2020-09-25 | Disposition: A | Discharge status: Home / Self Care | Payer: Medicare Other | Source: Ambulatory Visit | Attending: Internal Medicine | Admitting: Internal Medicine

## 2020-09-25 ENCOUNTER — Other Ambulatory Visit: Payer: Self-pay

## 2020-09-25 DIAGNOSIS — Z1231 Encounter for screening mammogram for malignant neoplasm of breast: Secondary | ICD-10-CM | POA: Diagnosis present

## 2020-09-26 ENCOUNTER — Other Ambulatory Visit (INDEPENDENT_AMBULATORY_CARE_PROVIDER_SITE_OTHER): Payer: Self-pay | Admitting: Nurse Practitioner

## 2020-09-26 DIAGNOSIS — R600 Localized edema: Secondary | ICD-10-CM

## 2020-09-27 ENCOUNTER — Encounter (INDEPENDENT_AMBULATORY_CARE_PROVIDER_SITE_OTHER): Payer: Self-pay | Admitting: Nurse Practitioner

## 2020-09-27 ENCOUNTER — Ambulatory Visit (INDEPENDENT_AMBULATORY_CARE_PROVIDER_SITE_OTHER): Payer: Medicare Other

## 2020-09-27 ENCOUNTER — Other Ambulatory Visit: Payer: Self-pay

## 2020-09-27 ENCOUNTER — Ambulatory Visit (INDEPENDENT_AMBULATORY_CARE_PROVIDER_SITE_OTHER): Payer: Medicare Other | Admitting: Nurse Practitioner

## 2020-09-27 VITALS — BP 127/78 | HR 95 | Resp 16 | Wt 173.4 lb

## 2020-09-27 DIAGNOSIS — I10 Essential (primary) hypertension: Secondary | ICD-10-CM | POA: Diagnosis not present

## 2020-09-27 DIAGNOSIS — R600 Localized edema: Secondary | ICD-10-CM

## 2020-09-27 DIAGNOSIS — E782 Mixed hyperlipidemia: Secondary | ICD-10-CM

## 2020-10-03 ENCOUNTER — Encounter: Payer: Self-pay | Admitting: Internal Medicine

## 2020-10-03 ENCOUNTER — Ambulatory Visit (INDEPENDENT_AMBULATORY_CARE_PROVIDER_SITE_OTHER): Payer: Medicare Other | Admitting: Podiatry

## 2020-10-03 ENCOUNTER — Other Ambulatory Visit: Payer: Self-pay

## 2020-10-03 DIAGNOSIS — R600 Localized edema: Secondary | ICD-10-CM

## 2020-10-03 NOTE — Progress Notes (Signed)
   HPI: 75 y.o. female presenting today for concern of bilateral lower extremity swelling.  Patient states that she is concerned due to the swelling of her legs.  They are not painful.  She was recently evaluated by vascular and below-knee compression socks were recommended.  She wears them daily and she says they helped significantly.  She is also concerned because she takes gabapentin and she believes that the gabapentin may be causing her leg swelling.  She receives the gabapentin from her family physician.  Past Medical History:  Diagnosis Date   Arthritis    B12 deficiency    Crohn disease ([place])    Crohn's disease ([place])    Diverticulosis    DJD (degenerative joint disease)    DJD (degenerative joint disease)    Essential hypertension 08/16/2019   Full dentures    GERD (gastroesophageal reflux disease)    pt deneis    Heart palpitations    Hypercholesterolemia    Hyperlipidemia    Incontinence of urine    Nocturnal leg cramps    Osteopenia    Osteoporosis    Reflux    Sleep apnea    uses a cpap   Wears glasses      Physical Exam: General: The patient is alert and oriented x3 in no acute distress.  Dermatology: Skin is warm, dry and supple bilateral lower extremities. Negative for open lesions or macerations.  Vascular: Palpable pedal pulses bilaterally.  Capillary refill immediate.  Very mild lower extremity edema is noted to the bilateral lower extremities.  Neurological: Epicritic and protective threshold grossly intact bilaterally.   Musculoskeletal Exam: No pedal deformities noted  Assessment: 1.  Mild to moderate edema bilateral lower extremities   Plan of Care:  1. Patient evaluated.   2.  Continue below-knee compression socks daily as per vascular 3.  The patient believes that the gabapentin may be increasing the lower extremity edema.  Recommend that she has a discussion with her family physician to possibly reduce the amount of gabapentin or stop it  altogether if she feels she does not need it 4.  Recommend daily stretching exercises and circulatory exercises 5.  Return to clinic as needed      Edrick Kins, DPM [facility]  Dr. Edrick Kins, DPM    [address], [place] [address]                Office [phone]  Fax [phone]

## 2020-10-10 ENCOUNTER — Encounter (INDEPENDENT_AMBULATORY_CARE_PROVIDER_SITE_OTHER): Payer: Self-pay | Admitting: Nurse Practitioner

## 2020-10-10 NOTE — Progress Notes (Signed)
Subjective:    Patient ID: Kimberly Andrade, female    DOB: 01/30/1946, 75 y.o.   MRN: [id number] Chief Complaint  Patient presents with   Follow-up    Ultrasound follow up    Kimberly Andrade is a 75 year old female that presents today for follow-up regarding her lower extremity edema.  The patient has been following conservative therapy since she was last seen including use of her medical grade compression stockings (20 to 30 mmHg) in addition she has been active as well as using elevation.  She notes that the compression socks help her legs feel much better and has also help with the edema.  She also notes that she feels that the swelling is slightly worse with the use of gabapentin.  Overall she has had improvement in swelling.  Today noninvasive studies show no evidence of DVT or superficial thrombophlebitis.  No evidence of deep venous insufficiency bilaterally.  No evidence of superficial venous reflux bilaterally.   Review of Systems  Cardiovascular:  Positive for leg swelling.  All other systems reviewed and are negative.     Objective:   Physical Exam Vitals reviewed.  HENT:     Head: Normocephalic.  Cardiovascular:     Rate and Rhythm: Normal rate.     Pulses: Normal pulses.  Pulmonary:     Effort: Pulmonary effort is normal.  Musculoskeletal:     Right lower leg: 1+ Edema present.     Left lower leg: 1+ Edema present.  Neurological:     Mental Status: She is alert and oriented to person, place, and time.  Psychiatric:        Mood and Affect: Mood normal.        Behavior: Behavior normal.        Thought Content: Thought content normal.        Judgment: Judgment normal.    BP 127/78 (BP Location: Right Arm)   Pulse 95   Resp 16   Wt 173 lb 6.4 oz (78.7 kg)   BMI 31.72 kg/m   Past Medical History:  Diagnosis Date   Arthritis    B12 deficiency    Crohn disease (HCC)    Crohn's disease (HCC)    Diverticulosis    DJD (degenerative joint disease)     DJD (degenerative joint disease)    Essential hypertension 08/16/2019   Full dentures    GERD (gastroesophageal reflux disease)    pt deneis    Heart palpitations    Hypercholesterolemia    Hyperlipidemia    Incontinence of urine    Nocturnal leg cramps    Osteopenia    Osteoporosis    Reflux    Sleep apnea    uses a cpap   Wears glasses     Social History   Socioeconomic History   Marital status: Married    Spouse name: Not on file   Number of children: 3   Years of education: Not on file   Highest education level: Associate degree: academic program  Occupational History   Occupation: retired  Tobacco Use   Smoking status: Former    Types: Cigarettes    Quit date: 12/29/2001    Years since quitting: 18.7   Smokeless tobacco: Never  Vaping Use   Vaping Use: Never used  Substance and Sexual Activity   Alcohol use: No   Drug use: No   Sexual activity: Not on file  Other Topics Concern   Not on file  Social History Narrative  Not on file   Social Determinants of Health   Financial Resource Strain: Low Risk    Difficulty of Paying Living Expenses: Not hard at all  Food Insecurity: No Food Insecurity   Worried About Charity fundraiser in the Last Year: Never true   [place] in the Last Year: Never true  Transportation Needs: No Transportation Needs   Lack of Transportation (Medical): No   Lack of Transportation (Non-Medical): No  Physical Activity: Inactive   Days of Exercise per Week: 0 days   Minutes of Exercise per Session: 0 min  Stress: No Stress Concern Present   Feeling of Stress : Not at all  Social Connections: Moderately Integrated   Frequency of Communication with Friends and Family: More than three times a week   Frequency of Social Gatherings with Friends and Family: More than three times a week   Attends Religious Services: More than 4 times per year   Active Member of Clubs or Organizations: No   Attends Archivist  Meetings: Never   Marital Status: Married  Human resources officer Violence: Not At Risk   Fear of Current or Ex-Partner: No   Emotionally Abused: No   Physically Abused: No   Sexually Abused: No    Past Surgical History:  Procedure Laterality Date   ABDOMINAL HYSTERECTOMY     APPENDECTOMY     CARPEL TUNNEL     CHOLECYSTECTOMY     COLONOSCOPY WITH PROPOFOL N/A 03/11/2016   Procedure: COLONOSCOPY WITH PROPOFOL;  Surgeon: Lollie Sails, MD;  Location: [facility] ENDOSCOPY;  Service: Endoscopy;  Laterality: N/A;   DILATION AND CURETTAGE OF UTERUS     ESOPHAGOGASTRODUODENOSCOPY N/A 03/11/2016   Procedure: ESOPHAGOGASTRODUODENOSCOPY (EGD);  Surgeon: Lollie Sails, MD;  Location: [facility] ENDOSCOPY;  Service: Endoscopy;  Laterality: N/A;   ESOPHAGOGASTRODUODENOSCOPY (EGD) WITH PROPOFOL N/A 11/11/2017   Procedure: ESOPHAGOGASTRODUODENOSCOPY (EGD) WITH PROPOFOL;  Surgeon: Lollie Sails, MD;  Location: [facility] ENDOSCOPY;  Service: Endoscopy;  Laterality: N/A;   ESOPHAGOGASTRODUODENOSCOPY (EGD) WITH PROPOFOL N/A 01/20/2018   Procedure: ESOPHAGOGASTRODUODENOSCOPY (EGD) WITH PROPOFOL;  Surgeon: Lollie Sails, MD;  Location: [facility] ENDOSCOPY;  Service: Endoscopy;  Laterality: N/A;   FOOT SURGERY Right    HAMMER TOE SURGERY     JOINT REPLACEMENT Right 2006   shoulder   KNEE ARTHROSCOPY Right 05/25/2013   Procedure: ARTHROSCOPY KNEE;  Surgeon: Hessie Dibble, MD;  Location: [place];  Service: Orthopedics;  Laterality: Right;  medial and lateral meniscal debridment and chondroplasty   righti shoulder replacement      ROBOTIC ASSISTED LAPAROSCOPIC REPAIR OF PARAESOPHAGEAL HERNIA  07/16/2018   SHOULDER ARTHROSCOPY WITH ROTATOR CUFF REPAIR AND SUBACROMIAL DECOMPRESSION Left 08/14/2017   Procedure: SHOULDER ARTHROSCOPY WITH ROTATOR CUFF REPAIR AND SUBACROMIAL DECOMPRESSION;  Surgeon: Tania Ade, MD;  Location: [place];  Service: Orthopedics;  Laterality: Left;   SHOULDER SURGERY Right     X 3   TOTAL HIP ARTHROPLASTY Left 11/25/2017   Procedure: LEFT TOTAL HIP ARTHROPLASTY ANTERIOR APPROACH;  Surgeon: Melrose Nakayama, MD;  Location: [place];  Service: Orthopedics;  Laterality: Left;   TOTAL KNEE ARTHROPLASTY Right 04/18/2020   Procedure: RIGHT TOTAL KNEE ARTHROPLASTY;  Surgeon: Melrose Nakayama, MD;  Location: [facility];  Service: Orthopedics;  Laterality: Right;    Family History  Problem Relation Age of Onset   Heart disease Mother    Esophageal cancer Father     Allergies  Allergen Reactions   Fosamax [Alendronate Sodium] Other (  See Comments)    Bones hurt   Lipitor [Atorvastatin] Other (See Comments)    Bone pain   Codeine Other (See Comments)     Codeine in the liquid form causes gastritis   Hydrocodone Nausea And Vomiting    CBC Latest Ref Rng & Units 04/24/2020 04/13/2020 02/16/2020  WBC 4.0 - 10.5 K/uL 7.0 4.9 6.5  Hemoglobin 12.0 - 15.0 g/dL 10.2(L) 14.4 13.1  Hematocrit 36.0 - 46.0 % 31.9(L) 44.8 39.6  Platelets 150 - 400 K/uL 337 189 237      CMP     Component Value Date/Time   NA 138 04/24/2020 1730   NA 142 02/16/2020 1017   K 4.0 04/24/2020 1730   CL 109 04/24/2020 1730   CO2 22 04/24/2020 1730   GLUCOSE 100 (H) 04/24/2020 1730   BUN 9 04/24/2020 1730   BUN 16 02/16/2020 1017   CREATININE 0.59 04/24/2020 1730   CALCIUM 8.4 (L) 04/24/2020 1730   PROT 6.4 (L) 04/24/2020 1730   PROT 6.1 02/16/2020 1017   ALBUMIN 3.0 (L) 04/24/2020 1730   ALBUMIN 4.2 02/16/2020 1017   AST 12 (L) 04/24/2020 1730   ALT 18 04/24/2020 1730   ALKPHOS 31 (L) 04/24/2020 1730   BILITOT 1.1 04/24/2020 1730   BILITOT 0.6 02/16/2020 1017   GFRNONAA >60 04/24/2020 1730   GFRAA 76 02/16/2020 1017     No results found.     Assessment & Plan:   1. Mild peripheral edema Thus far the patient has been doing well with conservative therapy including use of medical grade compression stockings, elevation and activity.  The patient will continue with conservative therapy we  will have her back in 6 months to reevaluate her progress.  At which time we can discuss whether a lymphedema pump will be used for continued conservative therapy is adequate.  The patient does have some concerns that Augmentin may be increasing or making her swelling worse.  I advised her to follow-up with her primary care physician about whether medication changes may be helpful.  2. Essential hypertension Continue antihypertensive medications as already ordered, these medications have been reviewed and there are no changes at this time.   3. Mixed hyperlipidemia Continue statin as ordered and reviewed, no changes at this time    Current Outpatient Medications on File Prior to Visit  Medication Sig Dispense Refill   Ascorbic Acid (VITAMIN C PO) Take 500 mg by mouth daily.     aspirin EC 81 MG tablet Take 1 tablet (81 mg total) by mouth 2 (two) times daily after a meal. 30 tablet 0   Calcium Carbonate-Vit D-Min (CALCIUM 600+D3 PLUS MINERALS) 600-800 MG-UNIT TABS Take 1 tablet by mouth daily.     cholecalciferol (VITAMIN D) 25 MCG (1000 UNIT) tablet Take 1,000 Units by mouth daily.     gabapentin (NEURONTIN) 300 MG capsule Take by mouth.     hydrochlorothiazide (HYDRODIURIL) 25 MG tablet Take 1 tablet (25 mg total) by mouth daily. 30 tablet 0   losartan (COZAAR) 50 MG tablet Take 1 tablet (50 mg total) by mouth daily. 90 tablet 1   Magnesium 250 MG TABS Take 250 mg by mouth daily.      mesalamine (PENTASA) 500 MG CR capsule Take 2,000 mg by mouth 2 (two) times daily as needed (Crohn's).     Multiple Vitamins-Minerals (MULTIVITAMIN WITH MINERALS) tablet Take 1 tablet by mouth daily.     NON FORMULARY BiPap nightly     oxybutynin (DITROPAN  XL) 15 MG 24 hr tablet Take 1 tablet (15 mg total) by mouth at bedtime. 90 tablet 3   Potassium 99 MG TABS Take 99 mg by mouth daily.      pravastatin (PRAVACHOL) 40 MG tablet Take 1 tablet (40 mg total) by mouth daily. 90 tablet 1   PROLIA 60 MG/ML SOSY  injection Inject 60 mg into the skin every 6 (six) months.     vitamin B-12 (CYANOCOBALAMIN) 1000 MCG tablet Take 1,000 mcg by mouth daily.     zinc gluconate 50 MG tablet Take 50 mg by mouth daily.     HYDROcodone-acetaminophen (NORCO/VICODIN) 5-325 MG tablet Take 1-2 tablets by mouth every 6 (six) hours as needed for moderate pain. (Patient not taking: No sig reported) 20 tablet 0   oxybutynin (DITROPAN) 5 MG tablet Take by mouth. (Patient not taking: No sig reported)     promethazine (PHENERGAN) 12.5 MG tablet Take 1-2 tablets (12.5-25 mg total) by mouth every 6 (six) hours as needed for nausea or vomiting. (Patient not taking: No sig reported) 40 tablet 0   tiZANidine (ZANAFLEX) 4 MG tablet Take 1 tablet (4 mg total) by mouth every 6 (six) hours as needed for muscle spasms. (Patient not taking: No sig reported) 40 tablet 1   No current facility-administered medications on file prior to visit.    There are no Patient Instructions on file for this visit. No follow-ups on file.   Kris Hartmann, NP

## 2020-10-12 ENCOUNTER — Other Ambulatory Visit: Payer: Self-pay | Admitting: Specialist

## 2020-10-12 DIAGNOSIS — R053 Chronic cough: Secondary | ICD-10-CM

## 2020-10-12 DIAGNOSIS — J849 Interstitial pulmonary disease, unspecified: Secondary | ICD-10-CM

## 2020-11-03 ENCOUNTER — Other Ambulatory Visit: Payer: Self-pay

## 2020-11-03 ENCOUNTER — Ambulatory Visit
Admission: RE | Admit: 2020-11-03 | Discharge: 2020-11-03 | Disposition: A | Discharge status: Home / Self Care | Payer: Medicare Other | Source: Ambulatory Visit | Attending: Specialist | Admitting: Specialist

## 2020-11-03 DIAGNOSIS — J849 Interstitial pulmonary disease, unspecified: Secondary | ICD-10-CM | POA: Diagnosis present

## 2020-11-03 DIAGNOSIS — R053 Chronic cough: Secondary | ICD-10-CM | POA: Insufficient documentation

## 2020-11-14 ENCOUNTER — Other Ambulatory Visit: Payer: Self-pay

## 2020-11-14 ENCOUNTER — Encounter: Payer: Self-pay | Admitting: Internal Medicine

## 2020-11-14 DIAGNOSIS — M79672 Pain in left foot: Secondary | ICD-10-CM

## 2020-11-14 DIAGNOSIS — M79671 Pain in right foot: Secondary | ICD-10-CM

## 2020-11-14 MED ORDER — GABAPENTIN 300 MG PO CAPS: 600.0000 mg | ORAL_CAPSULE | Freq: Two times a day (BID) | ORAL | 0 refills | Status: DC

## 2020-11-17 ENCOUNTER — Telehealth: Payer: Self-pay | Admitting: *Deleted

## 2020-11-17 NOTE — Telephone Encounter (Signed)
I apologize for the hold.  How can I help you?  "I was calling to schedule an appointment with Dr. Amalia Hailey for Friday of next week."  What problem are you having?  "I want him to talk to me more about the Neuropathy in my feet."  I scheduled her an appointment at 9:15 am on 11/24/2020.

## 2020-11-24 ENCOUNTER — Other Ambulatory Visit: Payer: Self-pay

## 2020-11-24 ENCOUNTER — Ambulatory Visit (INDEPENDENT_AMBULATORY_CARE_PROVIDER_SITE_OTHER): Payer: Medicare Other | Admitting: Podiatry

## 2020-11-24 DIAGNOSIS — G629 Polyneuropathy, unspecified: Secondary | ICD-10-CM

## 2020-11-24 NOTE — Progress Notes (Signed)
   HPI: 75 y.o. female presenting today for follow-up evaluation and treatment of bilateral lower extremity peripheral polyneuropathy.  Patient states that she went to a health and wellness center which wanted to charge her $6000 for a peripheral neuropathy regimen modality.  Patient would like to discuss this option and different treatment modalities regarding her peripheral neuropathy.  She currently takes gabapentin as prescribed by her PCP.  She presents for further treatment and evaluation  Past Medical History:  Diagnosis Date   Arthritis    B12 deficiency    Crohn disease ([place])    Crohn's disease ([place])    Diverticulosis    DJD (degenerative joint disease)    DJD (degenerative joint disease)    Essential hypertension 08/16/2019   Full dentures    GERD (gastroesophageal reflux disease)    pt deneis    Heart palpitations    Hypercholesterolemia    Hyperlipidemia    Incontinence of urine    Nocturnal leg cramps    Osteopenia    Osteoporosis    Reflux    Sleep apnea    uses a cpap   Wears glasses      Physical Exam: General: The patient is alert and oriented x3 in no acute distress.  Dermatology: Skin is warm, dry and supple bilateral lower extremities. Negative for open lesions or macerations.  Vascular: Palpable pedal pulses bilaterally. No edema or erythema noted. Capillary refill within normal limits.  Neurological: Epicritic and protective threshold diminished bilaterally.   Musculoskeletal Exam: No pedal deformities noted  Assessment: 1.  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 Patient evaluated.  2.  Continue gabapentin as prescribed by her PCP 3.  Advised against spending $6000 for these treatment regimens which have no guarantees and it is a lot of money.  Today I am going to order physical therapy at [facility] which has a [employer] treatment modality and she can go through physical therapy/insurance  for treatment.  Although there is no guarantees I do believe this may be a good modality to help alleviate some of her peripheral neuropathy symptoms 4.  Recommend OTC vitamin supplement for neuropathy 5.  Return to clinic as needed      Edrick Kins, DPM [facility]  Dr. Edrick Kins, DPM    [address], [place] [address]                Office [phone]  Fax [phone]

## 2021-02-19 ENCOUNTER — Ambulatory Visit (INDEPENDENT_AMBULATORY_CARE_PROVIDER_SITE_OTHER): Payer: Medicare Other | Admitting: Internal Medicine

## 2021-02-19 ENCOUNTER — Other Ambulatory Visit: Payer: Self-pay

## 2021-02-19 ENCOUNTER — Encounter: Payer: Self-pay | Admitting: Internal Medicine

## 2021-02-19 VITALS — BP 110/64 | HR 64 | Ht 62.0 in | Wt 173.4 lb

## 2021-02-19 DIAGNOSIS — R0781 Pleurodynia: Secondary | ICD-10-CM

## 2021-02-19 DIAGNOSIS — I10 Essential (primary) hypertension: Secondary | ICD-10-CM | POA: Diagnosis not present

## 2021-02-19 DIAGNOSIS — M810 Age-related osteoporosis without current pathological fracture: Secondary | ICD-10-CM | POA: Diagnosis not present

## 2021-02-19 DIAGNOSIS — Z23 Encounter for immunization: Secondary | ICD-10-CM | POA: Diagnosis not present

## 2021-02-19 DIAGNOSIS — D170 Benign lipomatous neoplasm of skin and subcutaneous tissue of head, face and neck: Secondary | ICD-10-CM

## 2021-02-19 DIAGNOSIS — E782 Mixed hyperlipidemia: Secondary | ICD-10-CM

## 2021-02-19 DIAGNOSIS — R7303 Prediabetes: Secondary | ICD-10-CM | POA: Diagnosis not present

## 2021-02-19 LAB — POCT URINALYSIS DIPSTICK
Bilirubin, UA: NEGATIVE
Blood, UA: NEGATIVE
Glucose, UA: NEGATIVE
Ketones, UA: NEGATIVE
Leukocytes, UA: NEGATIVE
Nitrite, UA: NEGATIVE
Protein, UA: NEGATIVE
Spec Grav, UA: 1.01 (ref 1.010–1.025)
Urobilinogen, UA: 0.2 E.U./dL
pH, UA: 6 (ref 5.0–8.0)

## 2021-02-19 MED ORDER — PRAVASTATIN SODIUM 40 MG PO TABS: 40.0000 mg | ORAL_TABLET | Freq: Every day | ORAL | 1 refills | Status: DC

## 2021-02-19 MED ORDER — LOSARTAN POTASSIUM 50 MG PO TABS
50.0000 mg | ORAL_TABLET | Freq: Every day | ORAL | 1 refills | Status: DC
Start: 2021-02-19 — End: 2021-10-05

## 2021-02-19 NOTE — Progress Notes (Signed)
Date:  02/19/2021   Name:  Kimberly Andrade   DOB:  02-20-1946   MRN:  [id number]   Chief Complaint: Annual Exam (Breast Exam. UA.) Kimberly Andrade is a 75 y.o. female who presents today for her Complete Annual Exam. She feels well. She reports exercising - some. She reports she is sleeping well. Breast complaints - none.  Mammogram: 09/2020 DEXA: 09/2020 osteoporosis Colonoscopy: 06/2019  Immunization History  Administered Date(s) Administered   Fluad Quad(high Dose 65+) 12/11/2018, 02/16/2020   Influenza, High Dose Seasonal PF 11/27/2017   Influenza, Seasonal, Injecte, Preservative Fre 11/27/2017   Pneumococcal Conjugate-13 06/29/2015   Pneumococcal Polysaccharide-23 04/29/2011   Tdap 07/10/2016   Zoster Recombinat (Shingrix) 10/19/2020, 01/19/2021   Zoster, Live 02/01/2014    Hypertension This is a chronic problem. The problem is controlled. Pertinent negatives include no chest pain, headaches, palpitations or shortness of breath. Past treatments include angiotensin blockers and diuretics. The current treatment provides significant improvement. There are no compliance problems.  There is no history of kidney disease, CAD/MI or CVA.  Hyperlipidemia This is a chronic problem. The problem is controlled. Pertinent negatives include no chest pain or shortness of breath. Current antihyperlipidemic treatment includes statins.  Gastroesophageal Reflux She complains of heartburn. She reports no abdominal pain, no chest pain, no coughing or no wheezing. This is a recurrent problem. The problem occurs occasionally. Pertinent negatives include no fatigue.   Lab Results  Component Value Date   NA 138 04/24/2020   K 4.0 04/24/2020   CO2 22 04/24/2020   GLUCOSE 100 (H) 04/24/2020   BUN 9 04/24/2020   CREATININE 0.59 04/24/2020   CALCIUM 8.4 (L) 04/24/2020   GFRNONAA >60 04/24/2020   Lab Results  Component Value Date   CHOL 160 02/16/2020   HDL 75 02/16/2020   LDLCALC 64  02/16/2020   TRIG 121 02/16/2020   CHOLHDL 2.1 02/16/2020   Lab Results  Component Value Date   TSH 2.560 02/16/2020   Lab Results  Component Value Date   HGBA1C 6.3 (H) 02/16/2020   Lab Results  Component Value Date   WBC 7.0 04/24/2020   HGB 10.2 (L) 04/24/2020   HCT 31.9 (L) 04/24/2020   MCV 89.9 04/24/2020   PLT 337 04/24/2020   Lab Results  Component Value Date   ALT 18 04/24/2020   AST 12 (L) 04/24/2020   ALKPHOS 31 (L) 04/24/2020   BILITOT 1.1 04/24/2020   Lab Results  Component Value Date   VD25OH 35.7 07/27/2019     Review of Systems  Constitutional:  Negative for chills, fatigue and fever.  HENT:  Negative for congestion, hearing loss, tinnitus, trouble swallowing and voice change.   Eyes:  Negative for visual disturbance.  Respiratory:  Negative for cough, chest tightness, shortness of breath and wheezing.   Cardiovascular:  Negative for chest pain, palpitations and leg swelling.  Gastrointestinal:  Positive for heartburn. Negative for abdominal pain, constipation, diarrhea and vomiting.  Endocrine: Negative for polydipsia and polyuria.  Genitourinary:  Negative for dysuria, frequency, genital sores, vaginal bleeding and vaginal discharge.  Musculoskeletal:  Negative for arthralgias, gait problem and joint swelling.       Right sided lower rib pain when lying on the right side  Skin:  Negative for color change and rash.       Lump on back of neck  Neurological:  Negative for dizziness, tremors, light-headedness and headaches.  Hematological:  Negative for adenopathy. Does not bruise/bleed easily.  Psychiatric/Behavioral:  Negative for dysphoric mood and sleep disturbance. The patient is not nervous/anxious.    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/27/2019   Atherosclerosis of abdominal aorta ([place]) 03/16/2018    DDD (degenerative disc disease), 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/2017   Prediabetes 12/25/2016   Nocturnal leg cramps 12/25/2016   Postmenopausal osteoporosis 10/16/2016   Urinary incontinence in female 10/16/2016   Mixed hyperlipidemia 10/05/2015   GERD (gastroesophageal reflux disease) 04/24/2015   Crohn's disease of small intestine without complication ([place]) 62/05/5595   Obstructive sleep apnea of adult 11/18/2013    Allergies  Allergen Reactions   Fosamax [Alendronate Sodium] Other (See Comments)    Bones hurt   Lipitor [Atorvastatin] Other (See Comments)    Bone pain   Codeine Other (See Comments)     Codeine in the liquid form causes gastritis   Hydrocodone Nausea And Vomiting    Past Surgical History:  Procedure Laterality Date   ABDOMINAL HYSTERECTOMY     APPENDECTOMY     CARPEL TUNNEL     CHOLECYSTECTOMY     COLONOSCOPY WITH PROPOFOL N/A 03/11/2016   Procedure: COLONOSCOPY WITH PROPOFOL;  Surgeon: Lollie Sails, MD;  Location: [facility] ENDOSCOPY;  Service: Endoscopy;  Laterality: N/A;   DILATION AND CURETTAGE OF UTERUS     ESOPHAGOGASTRODUODENOSCOPY N/A 03/11/2016   Procedure: ESOPHAGOGASTRODUODENOSCOPY (EGD);  Surgeon: Lollie Sails, MD;  Location: [facility] ENDOSCOPY;  Service: Endoscopy;  Laterality: N/A;   ESOPHAGOGASTRODUODENOSCOPY (EGD) WITH PROPOFOL N/A 11/11/2017   Procedure: ESOPHAGOGASTRODUODENOSCOPY (EGD) WITH PROPOFOL;  Surgeon: Lollie Sails, MD;  Location: [facility] ENDOSCOPY;  Service: Endoscopy;  Laterality: N/A;   ESOPHAGOGASTRODUODENOSCOPY (EGD) WITH PROPOFOL N/A 01/20/2018   Procedure: ESOPHAGOGASTRODUODENOSCOPY (EGD) WITH PROPOFOL;  Surgeon: Lollie Sails, MD;  Location: [facility] ENDOSCOPY;  Service: Endoscopy;  Laterality: N/A;   FOOT SURGERY Right    HAMMER TOE SURGERY     JOINT REPLACEMENT Right 2006   shoulder   KNEE ARTHROSCOPY Right 05/25/2013   Procedure: ARTHROSCOPY KNEE;  Surgeon: Hessie Dibble, MD;  Location: [place];  Service:  Orthopedics;  Laterality: Right;  medial and lateral meniscal debridment and chondroplasty   righti shoulder replacement      ROBOTIC ASSISTED LAPAROSCOPIC REPAIR OF PARAESOPHAGEAL HERNIA  07/16/2018   SHOULDER ARTHROSCOPY WITH ROTATOR CUFF REPAIR AND SUBACROMIAL DECOMPRESSION Left 08/14/2017   Procedure: SHOULDER ARTHROSCOPY WITH ROTATOR CUFF REPAIR AND SUBACROMIAL DECOMPRESSION;  Surgeon: Tania Ade, MD;  Location: [place];  Service: Orthopedics;  Laterality: Left;   SHOULDER SURGERY Right    X 3   TOTAL HIP ARTHROPLASTY Left 11/25/2017   Procedure: LEFT TOTAL HIP ARTHROPLASTY ANTERIOR APPROACH;  Surgeon: Melrose Nakayama, MD;  Location: [place];  Service: Orthopedics;  Laterality: Left;   TOTAL KNEE ARTHROPLASTY Right 04/18/2020   Procedure: RIGHT TOTAL KNEE ARTHROPLASTY;  Surgeon: Melrose Nakayama, MD;  Location: [facility];  Service: Orthopedics;  Laterality: Right;    Social History   Tobacco Use   Smoking status: Former    Types: Cigarettes    Quit date: 12/29/2001    Years since quitting: 19.1   Smokeless tobacco: Never  Vaping Use   Vaping Use: Never used  Substance Use Topics   Alcohol use: No   Drug use: No     Medication list has been reviewed and updated.  Current Meds  Medication Sig   albuterol (  VENTOLIN HFA) 108 (90 Base) MCG/ACT inhaler SMARTSIG:2 Puff(s) By Mouth Every 6 Hours PRN   Ascorbic Acid (VITAMIN C PO) Take 500 mg by mouth daily.   aspirin EC 81 MG tablet Take 1 tablet (81 mg total) by mouth 2 (two) times daily after a meal.   Calcium Carbonate-Vit D-Min (CALCIUM 600+D3 PLUS MINERALS) 600-800 MG-UNIT TABS Take 1 tablet by mouth daily.   cholecalciferol (VITAMIN D) 25 MCG (1000 UNIT) tablet Take 1,000 Units by mouth daily.   gabapentin (NEURONTIN) 300 MG capsule Take 2 capsules (600 mg total) by mouth 2 (two) times daily. (Patient taking differently: Take 300 mg by mouth daily.)   hydrochlorothiazide (HYDRODIURIL) 25 MG tablet Take 1 tablet (25 mg total) by  mouth daily.   losartan (COZAAR) 50 MG tablet Take 1 tablet (50 mg total) by mouth daily.   Magnesium 250 MG TABS Take 250 mg by mouth daily.    mesalamine (PENTASA) 500 MG CR capsule Take 2,000 mg by mouth 2 (two) times daily as needed (Crohn's).   Multiple Vitamins-Minerals (MULTIVITAMIN WITH MINERALS) tablet Take 1 tablet by mouth daily.   NON FORMULARY BiPap nightly   oxybutynin (DITROPAN XL) 15 MG 24 hr tablet Take 1 tablet (15 mg total) by mouth at bedtime.   Potassium 99 MG TABS Take 99 mg by mouth daily.    pravastatin (PRAVACHOL) 40 MG tablet Take 1 tablet (40 mg total) by mouth daily.   PROLIA 60 MG/ML SOSY injection Inject 60 mg into the skin every 6 (six) months.   vitamin B-12 (CYANOCOBALAMIN) 1000 MCG tablet Take 1,000 mcg by mouth daily.   zinc gluconate 50 MG tablet Take 50 mg by mouth daily.   [DISCONTINUED] Abaloparatide (TYMLOS) 3120 MCG/1.56ML SOPN Inject into the skin.    PHQ 2/9 Scores 02/19/2021 04/03/2020 02/16/2020 08/16/2019  PHQ - 2 Score 1 0 0 1  PHQ- 9 Score 4 - 3 3    GAD 7 : Generalized Anxiety Score 02/19/2021 02/16/2020 08/16/2019 07/27/2019  Nervous, Anxious, on Edge 0 0 1 0  Control/stop worrying 0 0 1 0  Worry too much - different things 0 0 1 0  Trouble relaxing 0 0 0 0  Restless 0 0 0 0  Easily annoyed or irritable 0 0 0 0  Afraid - awful might happen 0 0 0 0  Total GAD 7 Score 0 0 3 0  Anxiety Difficulty Not difficult at all - Not difficult at all Not difficult at all    BP Readings from Last 3 Encounters:  02/19/21 110/64  09/27/20 127/78  09/07/20 130/76    Physical Exam Vitals and nursing note reviewed.  Constitutional:      General: She is not in acute distress.    Appearance: She is well-developed.  HENT:     Head: Normocephalic and atraumatic.     Right Ear: Tympanic membrane and ear canal normal.     Left Ear: Tympanic membrane and ear canal normal.     Nose:     Right Sinus: No maxillary sinus tenderness.     Left Sinus: No  maxillary sinus tenderness.  Eyes:     General: No scleral icterus.       Right eye: No discharge.        Left eye: No discharge.     Conjunctiva/sclera: Conjunctivae normal.  Neck:     Thyroid: No thyromegaly.     Vascular: No carotid bruit.  Cardiovascular:     Rate and Rhythm:  Normal rate and regular rhythm.     Pulses: Normal pulses.     Heart sounds: Normal heart sounds.  Pulmonary:     Effort: Pulmonary effort is normal. No respiratory distress.     Breath sounds: No wheezing.  Chest:     Chest wall: Tenderness present.  Breasts:    Right: No mass, nipple discharge, skin change or tenderness.     Left: No mass, nipple discharge, skin change or tenderness.     Comments: Right lower ribs anteriorly Abdominal:     General: Bowel sounds are normal.     Palpations: Abdomen is soft.     Tenderness: There is no abdominal tenderness.  Musculoskeletal:     Cervical back: Normal range of motion. No erythema.     Right lower leg: No edema.     Left lower leg: No edema.  Lymphadenopathy:     Cervical: No cervical adenopathy.  Skin:    General: Skin is warm and dry.     Findings: No rash.          Comments: 2 cm soft mobile soft tissue mass - non tender  Neurological:     Mental Status: She is alert and oriented to person, place, and time.     Cranial Nerves: No cranial nerve deficit.     Sensory: No sensory deficit.     Deep Tendon Reflexes: Reflexes are normal and symmetric.  Psychiatric:        Attention and Perception: Attention normal.        Mood and Affect: Mood normal.    Wt Readings from Last 3 Encounters:  02/19/21 173 lb 6.4 oz (78.7 kg)  09/27/20 173 lb 6.4 oz (78.7 kg)  09/07/20 175 lb (79.4 kg)    BP 110/64    Pulse 64    Ht 5\' 2"  (1.575 m)    Wt 173 lb 6.4 oz (78.7 kg)    SpO2 97%    BMI 31.72 kg/m   Assessment and Plan: 1. Essential hypertension Clinically stable exam with well controlled BP. Tolerating medications without side effects at this  time. Pt to continue current regimen and low sodium diet; benefits of regular exercise as able discussed. - CBC with Differential/Platelet - Comprehensive metabolic panel - TSH - POCT urinalysis dipstick - losartan (COZAAR) 50 MG tablet; Take 1 tablet (50 mg total) by mouth daily.  Dispense: 90 tablet; Refill: 1  2. Prediabetes Continue with diet control. - Hemoglobin A1c  3. Mixed hyperlipidemia Tolerating statin medication without side effects at this time Continue same therapy without change at this time. - Lipid panel - pravastatin (PRAVACHOL) 40 MG tablet; Take 1 tablet (40 mg total) by mouth daily.  Dispense: 90 tablet; Refill: 1  4. Postmenopausal osteoporosis On Prolia and treated by Endo. Continue Calcium and vitamin D.  5. Lipoma of neck Pt reassured - lesion appears benign and does not need to be removed until it causes discomfort  6. Rib pain on right side Floating rib pain only when reclining on that side.    7. Need for immunization against influenza - Flu Vaccine QUAD High Dose(Fluad)   Partially dictated using Editor, commissioning. Any errors are unintentional.  Halina Maidens, MD [facility]  02/19/2021

## 2021-02-20 LAB — COMPREHENSIVE METABOLIC PANEL
ALT: 14 IU/L (ref 0–32)
AST: 17 IU/L (ref 0–40)
Albumin/Globulin Ratio: 2.1 (ref 1.2–2.2)
Albumin: 4.2 g/dL (ref 3.7–4.7)
Alkaline Phosphatase: 44 IU/L (ref 44–121)
BUN/Creatinine Ratio: 14 (ref 12–28)
BUN: 13 mg/dL (ref 8–27)
Bilirubin Total: 0.5 mg/dL (ref 0.0–1.2)
CO2: 25 mmol/L (ref 20–29)
Calcium: 9.8 mg/dL (ref 8.7–10.3)
Chloride: 102 mmol/L (ref 96–106)
Creatinine, Ser: 0.93 mg/dL (ref 0.57–1.00)
Globulin, Total: 2 g/dL (ref 1.5–4.5)
Glucose: 93 mg/dL (ref 70–99)
Potassium: 4.5 mmol/L (ref 3.5–5.2)
Sodium: 140 mmol/L (ref 134–144)
Total Protein: 6.2 g/dL (ref 6.0–8.5)
eGFR: 64 mL/min/{1.73_m2} (ref >59)

## 2021-02-20 LAB — CBC WITH DIFFERENTIAL/PLATELET
Basophils Absolute: 0.1 10*3/uL (ref 0.0–0.2)
Basos: 1 %
EOS (ABSOLUTE): 0.4 10*3/uL (ref 0.0–0.4)
Eos: 6 %
Hematocrit: 41 % (ref 34.0–46.6)
Hemoglobin: 13.3 g/dL (ref 11.1–15.9)
Immature Grans (Abs): 0 10*3/uL (ref 0.0–0.1)
Immature Granulocytes: 1 %
Lymphocytes Absolute: 1.6 10*3/uL (ref 0.7–3.1)
Lymphs: 26 %
MCH: 28.3 pg (ref 26.6–33.0)
MCHC: 32.4 g/dL (ref 31.5–35.7)
MCV: 87 fL (ref 79–97)
Monocytes Absolute: 0.5 10*3/uL (ref 0.1–0.9)
Monocytes: 8 %
Neutrophils Absolute: 3.6 10*3/uL (ref 1.4–7.0)
Neutrophils: 58 %
Platelets: 245 10*3/uL (ref 150–450)
RBC: 4.7 x10E6/uL (ref 3.77–5.28)
RDW: 13.2 % (ref 11.7–15.4)
WBC: 6.2 10*3/uL (ref 3.4–10.8)

## 2021-02-20 LAB — LIPID PANEL
Chol/HDL Ratio: 3.1 ratio (ref 0.0–4.4)
Cholesterol, Total: 148 mg/dL (ref 100–199)
HDL: 48 mg/dL (ref >39)
LDL Chol Calc (NIH): 71 mg/dL (ref 0–99)
Triglycerides: 172 mg/dL — ABNORMAL HIGH (ref 0–149)
VLDL Cholesterol Cal: 29 mg/dL (ref 5–40)

## 2021-02-20 LAB — HEMOGLOBIN A1C
Est. average glucose Bld gHb Est-mCnc: 117 mg/dL
Hgb A1c MFr Bld: 5.7 % — ABNORMAL HIGH (ref 4.8–5.6)

## 2021-02-20 LAB — TSH: TSH: 2.75 u[IU]/mL (ref 0.450–4.500)

## 2021-03-09 ENCOUNTER — Ambulatory Visit (INDEPENDENT_AMBULATORY_CARE_PROVIDER_SITE_OTHER): Payer: Medicare Other | Admitting: Internal Medicine

## 2021-03-09 ENCOUNTER — Other Ambulatory Visit: Payer: Self-pay

## 2021-03-09 ENCOUNTER — Encounter: Payer: Self-pay | Admitting: Internal Medicine

## 2021-03-09 VITALS — BP 122/78 | HR 61 | Temp 98.4°F | Ht 62.0 in | Wt 176.0 lb

## 2021-03-09 DIAGNOSIS — R051 Acute cough: Secondary | ICD-10-CM | POA: Diagnosis not present

## 2021-03-09 DIAGNOSIS — U071 COVID-19: Secondary | ICD-10-CM

## 2021-03-09 LAB — POC COVID19 BINAXNOW: SARS Coronavirus 2 Ag: POSITIVE — AB

## 2021-03-09 LAB — POCT INFLUENZA A/B
Influenza A, POC: NEGATIVE
Influenza B, POC: NEGATIVE

## 2021-03-09 MED ORDER — MOLNUPIRAVIR EUA 200MG CAPSULE: 4.0000 | ORAL_CAPSULE | Freq: Two times a day (BID) | ORAL | 0 refills | Status: AC

## 2021-03-09 MED ORDER — PROMETHAZINE-DM 6.25-15 MG/5ML PO SYRP: 5.0000 mL | ORAL_SOLUTION | Freq: Four times a day (QID) | ORAL | 0 refills | Status: DC | PRN

## 2021-03-09 NOTE — Progress Notes (Signed)
Date:  03/09/2021   Name:  Kimberly Andrade   DOB:  [birth year]   MRN:  [id number]   Chief Complaint: URI  URI  This is a new problem. Episode onset: 2 days ago. The problem has been gradually worsening. There has been no fever. Associated symptoms include coughing, headaches, joint pain, nausea and sinus pain. Pertinent negatives include no congestion, diarrhea, sore throat, vomiting or wheezing. Treatments tried: mucinex. The treatment provided significant relief.  She has not been immunized for Covid-19. Risk of complications is 4. No known exposure to influenza or Covid-19.  Lab Results  Component Value Date   NA 140 02/19/2021   K 4.5 02/19/2021   CO2 25 02/19/2021   GLUCOSE 93 02/19/2021   BUN 13 02/19/2021   CREATININE 0.93 02/19/2021   CALCIUM 9.8 02/19/2021   EGFR 64 02/19/2021   GFRNONAA >60 04/24/2020   Lab Results  Component Value Date   CHOL 148 02/19/2021   HDL 48 02/19/2021   LDLCALC 71 02/19/2021   TRIG 172 (H) 02/19/2021   CHOLHDL 3.1 02/19/2021   Lab Results  Component Value Date   TSH 2.750 02/19/2021   Lab Results  Component Value Date   HGBA1C 5.7 (H) 02/19/2021   Lab Results  Component Value Date   WBC 6.2 02/19/2021   HGB 13.3 02/19/2021   HCT 41.0 02/19/2021   MCV 87 02/19/2021   PLT 245 02/19/2021   Lab Results  Component Value Date   ALT 14 02/19/2021   AST 17 02/19/2021   ALKPHOS 44 02/19/2021   BILITOT 0.5 02/19/2021   Lab Results  Component Value Date   VD25OH 35.7 07/27/2019     Review of Systems  Constitutional:  Positive for fatigue. Negative for chills and fever.  HENT:  Positive for sinus pain. Negative for congestion, sore throat and trouble swallowing.   Respiratory:  Positive for cough. Negative for chest tightness, shortness of breath and wheezing.   Gastrointestinal:  Positive for nausea. Negative for diarrhea and vomiting.  Musculoskeletal:  Positive for joint pain.  Neurological:  Positive for headaches.  Negative for dizziness.   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/27/2019   Atherosclerosis of abdominal aorta ([place]) 03/16/2018   DDD (degenerative disc disease), 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/2017   Prediabetes 12/25/2016   Nocturnal leg cramps 12/25/2016   Postmenopausal osteoporosis 10/16/2016   Urinary incontinence in female 10/16/2016   Mixed hyperlipidemia 10/05/2015   GERD (gastroesophageal reflux disease) 04/24/2015   Crohn's disease of small intestine without complication ([place]) 35/67/0141   Obstructive sleep apnea of adult 11/18/2013    Allergies  Allergen Reactions   Fosamax [Alendronate Sodium] Other (See Comments)    Bones hurt   Lipitor [Atorvastatin] Other (See Comments)    Bone pain   Codeine Other (See Comments)     Codeine in the liquid form causes gastritis   Hydrocodone Nausea And Vomiting    Past Surgical History:  Procedure Laterality Date   ABDOMINAL HYSTERECTOMY     APPENDECTOMY     CARPEL TUNNEL     CHOLECYSTECTOMY     COLONOSCOPY WITH PROPOFOL N/A 03/11/2016   Procedure: COLONOSCOPY WITH PROPOFOL;  Surgeon: Lollie Sails, MD;  Location: [facility] ENDOSCOPY;  Service: Endoscopy;  Laterality: N/A;   DILATION AND CURETTAGE OF UTERUS     ESOPHAGOGASTRODUODENOSCOPY N/A 03/11/2016  Procedure: ESOPHAGOGASTRODUODENOSCOPY (EGD);  Surgeon: Lollie Sails, MD;  Location: [facility] ENDOSCOPY;  Service: Endoscopy;  Laterality: N/A;   ESOPHAGOGASTRODUODENOSCOPY (EGD) WITH PROPOFOL N/A 11/11/2017   Procedure: ESOPHAGOGASTRODUODENOSCOPY (EGD) WITH PROPOFOL;  Surgeon: Lollie Sails, MD;  Location: [facility] ENDOSCOPY;  Service: Endoscopy;  Laterality: N/A;   ESOPHAGOGASTRODUODENOSCOPY (EGD) WITH PROPOFOL N/A 01/20/2018   Procedure: ESOPHAGOGASTRODUODENOSCOPY (EGD) WITH PROPOFOL;  Surgeon: Lollie Sails, MD;  Location: [place] ENDOSCOPY;   Service: Endoscopy;  Laterality: N/A;   FOOT SURGERY Right    HAMMER TOE SURGERY     JOINT REPLACEMENT Right 2006   shoulder   KNEE ARTHROSCOPY Right 05/25/2013   Procedure: ARTHROSCOPY KNEE;  Surgeon: Hessie Dibble, MD;  Location: [place];  Service: Orthopedics;  Laterality: Right;  medial and lateral meniscal debridment and chondroplasty   righti shoulder replacement      ROBOTIC ASSISTED LAPAROSCOPIC REPAIR OF PARAESOPHAGEAL HERNIA  07/16/2018   SHOULDER ARTHROSCOPY WITH ROTATOR CUFF REPAIR AND SUBACROMIAL DECOMPRESSION Left 08/14/2017   Procedure: SHOULDER ARTHROSCOPY WITH ROTATOR CUFF REPAIR AND SUBACROMIAL DECOMPRESSION;  Surgeon: Tania Ade, MD;  Location: [place];  Service: Orthopedics;  Laterality: Left;   SHOULDER SURGERY Right    X 3   TOTAL HIP ARTHROPLASTY Left 11/25/2017   Procedure: LEFT TOTAL HIP ARTHROPLASTY ANTERIOR APPROACH;  Surgeon: Melrose Nakayama, MD;  Location: [place];  Service: Orthopedics;  Laterality: Left;   TOTAL KNEE ARTHROPLASTY Right 04/18/2020   Procedure: RIGHT TOTAL KNEE ARTHROPLASTY;  Surgeon: Melrose Nakayama, MD;  Location: [facility];  Service: Orthopedics;  Laterality: Right;    Social History   Tobacco Use   Smoking status: Former    Types: Cigarettes    Quit date: 12/29/2001    Years since quitting: 19.2   Smokeless tobacco: Never  Vaping Use   Vaping Use: Never used  Substance Use Topics   Alcohol use: No   Drug use: No     Medication list has been reviewed and updated.  No outpatient medications have been marked as taking for the 03/09/21 encounter (Office Visit) with Glean Hess, MD.    [facility] 2/9 Scores 03/09/2021 02/19/2021 04/03/2020 02/16/2020  PHQ - 2 Score 0 1 0 0  PHQ- 9 Score 0 4 - 3    GAD 7 : Generalized Anxiety Score 03/09/2021 02/19/2021 02/16/2020 08/16/2019  Nervous, Anxious, on Edge 0 0 0 1  Control/stop worrying 0 0 0 1  Worry too much - different things 0 0 0 1  Trouble relaxing 0 0 0 0  Restless  0 0 0 0  Easily annoyed or irritable 0 0 0 0  Afraid - awful might happen 0 0 0 0  Total GAD 7 Score 0 0 0 3  Anxiety Difficulty Not difficult at all Not difficult at all - Not difficult at all    BP Readings from Last 3 Encounters:  03/09/21 122/78  02/19/21 110/64  09/27/20 127/78    Physical Exam Constitutional:      Appearance: She is ill-appearing.  Cardiovascular:     Rate and Rhythm: Normal rate and regular rhythm.     Pulses: Normal pulses.  Pulmonary:     Effort: Pulmonary effort is normal.     Breath sounds: No wheezing or rhonchi.     Comments: Loose non productive cough Musculoskeletal:     Cervical back: Normal range of motion. No tenderness.     Right lower leg: No edema.     Left lower leg: No  edema.  Lymphadenopathy:     Cervical: No cervical adenopathy.  Neurological:     General: No focal deficit present.     Mental Status: She is alert and oriented to person, place, and time.  Psychiatric:        Mood and Affect: Mood normal.    Wt Readings from Last 3 Encounters:  03/09/21 176 lb (79.8 kg)  02/19/21 173 lb 6.4 oz (78.7 kg)  09/27/20 173 lb 6.4 oz (78.7 kg)    BP 122/78    Pulse 61    Temp 98.4 F (36.9 C) (Oral)    Ht 5' 2"  (1.575 m)    Wt 176 lb (79.8 kg)    SpO2 93%    BMI 32.19 kg/m   Assessment and Plan: 1. COVID-19 virus infection Take Tylenol 650 - 1000 mg every 6-8 hours for fever, body aches and headache. Drink plenty of fluids with electrolytes. Monitor for fever that does not decrease and/or shortness of breath that worsens or is present at rest. - molnupiravir EUA (LAGEVRIO) 200 mg CAPS capsule; Take 4 capsules (800 mg total) by mouth 2 (two) times daily for 5 days.  Dispense: 40 capsule; Refill: 0 - promethazine-dextromethorphan (PROMETHAZINE-DM) 6.25-15 MG/5ML syrup; Take 5 mLs by mouth 4 (four) times daily as needed for cough.  Dispense: 118 mL; Refill: 0  2. Acute cough Use Promethazine-DM as needed. - POC COVID-19 BinaxNow -  POCT Influenza A/B   Partially dictated using Editor, commissioning. Any errors are unintentional.  Halina Maidens, MD [facility]  03/09/2021

## 2021-03-09 NOTE — Patient Instructions (Signed)
Take Tylenol 650 - 1000 mg every 6-8 hours for fever, body aches and headache. Drink plenty of fluids with electrolytes. Monitor for fever that does not decrease and/or shortness of breath that worsens or is present at rest.   

## 2021-03-20 ENCOUNTER — Encounter: Payer: Self-pay | Admitting: Internal Medicine

## 2021-03-20 ENCOUNTER — Ambulatory Visit
Admission: RE | Admit: 2021-03-20 | Discharge: 2021-03-20 | Disposition: A | Discharge status: Home / Self Care | Payer: Medicare Other | Source: Ambulatory Visit | Attending: Internal Medicine | Admitting: Internal Medicine

## 2021-03-20 ENCOUNTER — Other Ambulatory Visit: Payer: Self-pay

## 2021-03-20 ENCOUNTER — Ambulatory Visit (INDEPENDENT_AMBULATORY_CARE_PROVIDER_SITE_OTHER): Payer: Medicare Other | Admitting: Internal Medicine

## 2021-03-20 VITALS — BP 128/84 | HR 65 | Temp 98.4°F | Ht 62.0 in | Wt 179.2 lb

## 2021-03-20 DIAGNOSIS — M79605 Pain in left leg: Secondary | ICD-10-CM | POA: Diagnosis present

## 2021-03-20 DIAGNOSIS — M7989 Other specified soft tissue disorders: Secondary | ICD-10-CM | POA: Insufficient documentation

## 2021-03-20 DIAGNOSIS — R052 Subacute cough: Secondary | ICD-10-CM

## 2021-03-20 NOTE — Progress Notes (Signed)
Date:  03/20/2021   Name:  Kimberly Andrade   DOB:  1945-10-17   MRN:  [id number]   Chief Complaint: No chief complaint on file.  Cough This is a new problem. The current episode started 1 to 4 weeks ago. The problem occurs hourly. The cough is Productive of sputum. Pertinent negatives include no chest pain, chills, ear pain, fever, headaches, nasal congestion, sore throat, shortness of breath or wheezing. This patients 11th day from having Covid. She has had Covid 3 times.  Lab Results  Component Value Date   NA 140 02/19/2021   K 4.5 02/19/2021   CO2 25 02/19/2021   GLUCOSE 93 02/19/2021   BUN 13 02/19/2021   CREATININE 0.93 02/19/2021   CALCIUM 9.8 02/19/2021   EGFR 64 02/19/2021   GFRNONAA >60 04/24/2020   Lab Results  Component Value Date   CHOL 148 02/19/2021   HDL 48 02/19/2021   LDLCALC 71 02/19/2021   TRIG 172 (H) 02/19/2021   CHOLHDL 3.1 02/19/2021   Lab Results  Component Value Date   TSH 2.750 02/19/2021   Lab Results  Component Value Date   HGBA1C 5.7 (H) 02/19/2021   Lab Results  Component Value Date   WBC 6.2 02/19/2021   HGB 13.3 02/19/2021   HCT 41.0 02/19/2021   MCV 87 02/19/2021   PLT 245 02/19/2021   Lab Results  Component Value Date   ALT 14 02/19/2021   AST 17 02/19/2021   ALKPHOS 44 02/19/2021   BILITOT 0.5 02/19/2021   Lab Results  Component Value Date   VD25OH 35.7 07/27/2019     Review of Systems  Constitutional:  Negative for chills, fatigue and fever.  HENT:  Negative for ear pain and sore throat.   Respiratory:  Positive for cough. Negative for chest tightness, shortness of breath and wheezing.   Cardiovascular:  Positive for leg swelling (new in left leg). Negative for chest pain and palpitations.  Musculoskeletal:  Positive for arthralgias and back pain (in posterior lower right ribs when lying down).  Neurological:  Negative for dizziness, light-headedness and headaches.   Patient Active Problem List    Diagnosis Date Noted   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   Essential hypertension 08/16/2019   Mild peripheral edema 07/27/2019   Morton's neuroma 07/27/2019   Atherosclerosis of abdominal aorta ([place]) 03/16/2018   DDD (degenerative disc disease), 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/2017   Prediabetes 12/25/2016   Nocturnal leg cramps 12/25/2016   Postmenopausal osteoporosis 10/16/2016   Urinary incontinence in female 10/16/2016   Mixed hyperlipidemia 10/05/2015   GERD (gastroesophageal reflux disease) 04/24/2015   Crohn's disease of small intestine without complication ([place]) 38/32/9191   Obstructive sleep apnea of adult 11/18/2013    Allergies  Allergen Reactions   Fosamax [Alendronate Sodium] Other (See Comments)    Bones hurt   Lipitor [Atorvastatin] Other (See Comments)    Bone pain   Codeine Other (See Comments)     Codeine in the liquid form causes gastritis   Hydrocodone Nausea And Vomiting    Past Surgical History:  Procedure Laterality Date   ABDOMINAL HYSTERECTOMY     APPENDECTOMY     CARPEL TUNNEL     CHOLECYSTECTOMY     COLONOSCOPY WITH PROPOFOL N/A 03/11/2016   Procedure: COLONOSCOPY WITH PROPOFOL;  Surgeon: Lollie Sails, MD;  Location: [facility] ENDOSCOPY;  Service: Endoscopy;  Laterality: N/A;   DILATION AND CURETTAGE OF UTERUS     ESOPHAGOGASTRODUODENOSCOPY N/A 03/11/2016   Procedure: ESOPHAGOGASTRODUODENOSCOPY (  EGD);  Surgeon: Lollie Sails, MD;  Location: [facility] ENDOSCOPY;  Service: Endoscopy;  Laterality: N/A;   ESOPHAGOGASTRODUODENOSCOPY (EGD) WITH PROPOFOL N/A 11/11/2017   Procedure: ESOPHAGOGASTRODUODENOSCOPY (EGD) WITH PROPOFOL;  Surgeon: Lollie Sails, MD;  Location: [facility] ENDOSCOPY;  Service: Endoscopy;  Laterality: N/A;   ESOPHAGOGASTRODUODENOSCOPY (EGD) WITH PROPOFOL N/A 01/20/2018   Procedure: ESOPHAGOGASTRODUODENOSCOPY (EGD) WITH PROPOFOL;  Surgeon: Lollie Sails, MD;  Location: [facility] ENDOSCOPY;  Service: Endoscopy;  Laterality: N/A;   FOOT SURGERY Right     HAMMER TOE SURGERY     JOINT REPLACEMENT Right 2006   shoulder   KNEE ARTHROSCOPY Right 05/25/2013   Procedure: ARTHROSCOPY KNEE;  Surgeon: Hessie Dibble, MD;  Location: [place];  Service: Orthopedics;  Laterality: Right;  medial and lateral meniscal debridment and chondroplasty   righti shoulder replacement      ROBOTIC ASSISTED LAPAROSCOPIC REPAIR OF PARAESOPHAGEAL HERNIA  07/16/2018   SHOULDER ARTHROSCOPY WITH ROTATOR CUFF REPAIR AND SUBACROMIAL DECOMPRESSION Left 08/14/2017   Procedure: SHOULDER ARTHROSCOPY WITH ROTATOR CUFF REPAIR AND SUBACROMIAL DECOMPRESSION;  Surgeon: Tania Ade, MD;  Location: [place];  Service: Orthopedics;  Laterality: Left;   SHOULDER SURGERY Right    X 3   TOTAL HIP ARTHROPLASTY Left 11/25/2017   Procedure: LEFT TOTAL HIP ARTHROPLASTY ANTERIOR APPROACH;  Surgeon: Melrose Nakayama, MD;  Location: [place];  Service: Orthopedics;  Laterality: Left;   TOTAL KNEE ARTHROPLASTY Right 04/18/2020   Procedure: RIGHT TOTAL KNEE ARTHROPLASTY;  Surgeon: Melrose Nakayama, MD;  Location: [facility];  Service: Orthopedics;  Laterality: Right;    Social History   Tobacco Use   Smoking status: Former    Types: Cigarettes    Quit date: 12/29/2001    Years since quitting: 19.2   Smokeless tobacco: Never  Vaping Use   Vaping Use: Never used  Substance Use Topics   Alcohol use: No   Drug use: No     Medication list has been reviewed and updated.  Current Meds  Medication Sig   albuterol (VENTOLIN HFA) 108 (90 Base) MCG/ACT inhaler SMARTSIG:2 Puff(s) By Mouth Every 6 Hours PRN   Ascorbic Acid (VITAMIN C PO) Take 500 mg by mouth daily.   aspirin EC 81 MG tablet Take 1 tablet (81 mg total) by mouth 2 (two) times daily after a meal.   Calcium Carbonate-Vit D-Min (CALCIUM 600+D3 PLUS MINERALS) 600-800 MG-UNIT TABS Take 1 tablet by mouth daily.   cholecalciferol (VITAMIN D) 25 MCG (1000 UNIT) tablet Take 1,000 Units by mouth daily.   gabapentin (NEURONTIN)  300 MG capsule Take 2 capsules (600 mg total) by mouth 2 (two) times daily. (Patient taking differently: Take 300 mg by mouth daily.)   losartan (COZAAR) 50 MG tablet Take 1 tablet (50 mg total) by mouth daily.   Magnesium 250 MG TABS Take 250 mg by mouth daily.    mesalamine (PENTASA) 500 MG CR capsule Take 2,000 mg by mouth 2 (two) times daily as needed (Crohn's).   Multiple Vitamins-Minerals (MULTIVITAMIN WITH MINERALS) tablet Take 1 tablet by mouth daily.   NON FORMULARY BiPap nightly   oxybutynin (DITROPAN XL) 15 MG 24 hr tablet Take 1 tablet (15 mg total) by mouth at bedtime.   Potassium 99 MG TABS Take 99 mg by mouth daily.    pravastatin (PRAVACHOL) 40 MG tablet Take 1 tablet (40 mg total) by mouth daily.   PROLIA 60 MG/ML SOSY injection Inject 60 mg into the skin every 6 (six) months.   promethazine-dextromethorphan (PROMETHAZINE-DM)  6.25-15 MG/5ML syrup Take 5 mLs by mouth 4 (four) times daily as needed for cough.   vitamin B-12 (CYANOCOBALAMIN) 1000 MCG tablet Take 1,000 mcg by mouth daily.   zinc gluconate 50 MG tablet Take 50 mg by mouth daily.    PHQ 2/9 Scores 03/20/2021 03/09/2021 02/19/2021 04/03/2020  PHQ - 2 Score 0 0 1 0  PHQ- 9 Score 3 0 4 -    GAD 7 : Generalized Anxiety Score 03/20/2021 03/09/2021 02/19/2021 02/16/2020  Nervous, Anxious, on Edge 0 0 0 0  Control/stop worrying 0 0 0 0  Worry too much - different things 0 0 0 0  Trouble relaxing 0 0 0 0  Restless 0 0 0 0  Easily annoyed or irritable 0 0 0 0  Afraid - awful might happen 0 0 0 0  Total GAD 7 Score 0 0 0 0  Anxiety Difficulty Not difficult at all Not difficult at all Not difficult at all -    BP Readings from Last 3 Encounters:  03/20/21 128/84  03/09/21 122/78  02/19/21 110/64    Physical Exam Vitals and nursing note reviewed.  Constitutional:      General: She is not in acute distress.    Appearance: Normal appearance. She is well-developed.  HENT:     Head: Normocephalic and atraumatic.   Cardiovascular:     Rate and Rhythm: Normal rate and regular rhythm.     Pulses: Normal pulses.  Pulmonary:     Effort: Pulmonary effort is normal. No respiratory distress.     Breath sounds: Examination of the right-lower field reveals decreased breath sounds. Examination of the left-lower field reveals decreased breath sounds. Decreased breath sounds present. No wheezing or rhonchi.  Musculoskeletal:     Cervical back: Normal range of motion.     Left lower leg: No tenderness. 2+ Edema present.     Comments: Left calf 1/2 inch larger than right Negative homan's sign  Lymphadenopathy:     Cervical: No cervical adenopathy.  Skin:    General: Skin is warm and dry.     Findings: No rash.  Neurological:     Mental Status: She is alert and oriented to person, place, and time.  Psychiatric:        Mood and Affect: Mood normal.        Behavior: Behavior normal.    Wt Readings from Last 3 Encounters:  03/20/21 179 lb 3.2 oz (81.3 kg)  03/09/21 176 lb (79.8 kg)  02/19/21 173 lb 6.4 oz (78.7 kg)    BP 128/84    Pulse 65    Temp 98.4 F (36.9 C) (Oral)    Ht 5' 2"  (1.575 m)    Wt 179 lb 3.2 oz (81.3 kg)    SpO2 95%    BMI 32.78 kg/m   Assessment and Plan: 1. Subacute cough Recovering from Covid-19 infection with lingering cough. Continue otc cough syrup as needed Albuterol as needed. No indication for antibiotics at this time.  2. Pain and swelling of left lower extremity Concern for DVT - US negative Pt instructed to elevate her legs when sitting whenever possible. Do not take the Eliquis sample - US Venous Img Lower Unilateral Left (DVT) - negative   Partially dictated using Editor, commissioning. Any errors are unintentional.  Halina Maidens, MD [facility]  03/20/2021

## 2021-03-26 NOTE — Progress Notes (Signed)
MRN : [id number]  Kimberly Andrade is a 76 y.o. (12/10/1945) female who presents with chief complaint of check leg swelling.  History of Present Illness:   The patient returns to the office for followup evaluation regarding leg swelling.  The swelling has persisted, per the patient it has increased in spite of her compression and elevation.  She notes the pain associated with swelling continues and has also worsened. There have not been any interval development of a ulcerations or wounds.  Since the previous visit the patient has been wearing graduated compression stockings and has noted little if any improvement in the lymphedema. The patient has been using compression routinely morning until night.  The patient also states elevation during the day and exercise is being done too.   She is also c/o a venous lake of the lower lip on the right side.  She notes that it has increased in size and occasionally is problematic when she is chewing it also is tender and this has prompted her to request treatment.    No outpatient medications have been marked as taking for the 03/29/21 encounter (Appointment) with Delana Meyer, Dolores Lory, MD.    Past Medical History:  Diagnosis Date   Arthritis    B12 deficiency    Crohn disease ([place])    Crohn's disease ([place])    Diverticulosis    DJD (degenerative joint disease)    DJD (degenerative joint disease)    Essential hypertension 08/16/2019   Full dentures    GERD (gastroesophageal reflux disease)    pt deneis    Heart palpitations    Hypercholesterolemia    Hyperlipidemia    Incontinence of urine    Nocturnal leg cramps    Osteopenia    Osteoporosis    Reflux    Sleep apnea    uses a cpap   Wears glasses     Past Surgical History:  Procedure Laterality Date   ABDOMINAL HYSTERECTOMY     APPENDECTOMY     CARPEL TUNNEL     CHOLECYSTECTOMY     COLONOSCOPY WITH PROPOFOL N/A 03/11/2016   Procedure: COLONOSCOPY WITH PROPOFOL;  Surgeon:  Lollie Sails, MD;  Location: [place] ENDOSCOPY;  Service: Endoscopy;  Laterality: N/A;   DILATION AND CURETTAGE OF UTERUS     ESOPHAGOGASTRODUODENOSCOPY N/A 03/11/2016   Procedure: ESOPHAGOGASTRODUODENOSCOPY (EGD);  Surgeon: Lollie Sails, MD;  Location: [facility] ENDOSCOPY;  Service: Endoscopy;  Laterality: N/A;   ESOPHAGOGASTRODUODENOSCOPY (EGD) WITH PROPOFOL N/A 11/11/2017   Procedure: ESOPHAGOGASTRODUODENOSCOPY (EGD) WITH PROPOFOL;  Surgeon: Lollie Sails, MD;  Location: [facility] ENDOSCOPY;  Service: Endoscopy;  Laterality: N/A;   ESOPHAGOGASTRODUODENOSCOPY (EGD) WITH PROPOFOL N/A 01/20/2018   Procedure: ESOPHAGOGASTRODUODENOSCOPY (EGD) WITH PROPOFOL;  Surgeon: Lollie Sails, MD;  Location: [facility] ENDOSCOPY;  Service: Endoscopy;  Laterality: N/A;   FOOT SURGERY Right    HAMMER TOE SURGERY     JOINT REPLACEMENT Right 2006   shoulder   KNEE ARTHROSCOPY Right 05/25/2013   Procedure: ARTHROSCOPY KNEE;  Surgeon: Hessie Dibble, MD;  Location: [place];  Service: Orthopedics;  Laterality: Right;  medial and lateral meniscal debridment and chondroplasty   righti shoulder replacement      ROBOTIC ASSISTED LAPAROSCOPIC REPAIR OF PARAESOPHAGEAL HERNIA  07/16/2018   SHOULDER ARTHROSCOPY WITH ROTATOR CUFF REPAIR AND SUBACROMIAL DECOMPRESSION Left 08/14/2017   Procedure: SHOULDER ARTHROSCOPY WITH ROTATOR CUFF REPAIR AND SUBACROMIAL DECOMPRESSION;  Surgeon: Tania Ade, MD;  Location: [place];  Service: Orthopedics;  Laterality: Left;  SHOULDER SURGERY Right    X 3   TOTAL HIP ARTHROPLASTY Left 11/25/2017   Procedure: LEFT TOTAL HIP ARTHROPLASTY ANTERIOR APPROACH;  Surgeon: Melrose Nakayama, MD;  Location: [place];  Service: Orthopedics;  Laterality: Left;   TOTAL KNEE ARTHROPLASTY Right 04/18/2020   Procedure: RIGHT TOTAL KNEE ARTHROPLASTY;  Surgeon: Melrose Nakayama, MD;  Location: [facility];  Service: Orthopedics;  Laterality: Right;    Social History Social History   Tobacco Use    Smoking status: Former    Types: Cigarettes    Quit date: 12/29/2001    Years since quitting: 19.2   Smokeless tobacco: Never  Vaping Use   Vaping Use: Never used  Substance Use Topics   Alcohol use: No   Drug use: No    Family History Family History  Problem Relation Age of Onset   Heart disease Mother    Esophageal cancer Father     Allergies  Allergen Reactions   Fosamax [Alendronate Sodium] Other (See Comments)    Bones hurt   Lipitor [Atorvastatin] Other (See Comments)    Bone pain   Codeine Other (See Comments)     Codeine in the liquid form causes gastritis   Hydrocodone Nausea And Vomiting     REVIEW OF SYSTEMS (Negative unless checked)  Constitutional: [] Weight loss  [] Fever  [] Chills Cardiac: [] Chest pain   [] Chest pressure   [] Palpitations   [] Shortness of breath when laying flat   [] Shortness of breath with exertion. Vascular:  [] Pain in legs with walking   [x] Pain in legs at rest  [] History of DVT   [] Phlebitis   [x] Swelling in legs   [] Varicose veins   [] Non-healing ulcers Pulmonary:   [] Uses home oxygen   [] Productive cough   [] Hemoptysis   [] Wheeze  [] COPD   [] Asthma Neurologic:  [] Dizziness   [] Seizures   [] History of stroke   [] History of TIA  [] Aphasia   [] Vissual changes   [] Weakness or numbness in arm   [] Weakness or numbness in leg Musculoskeletal:   [] Joint swelling   [] Joint pain   [x] Low back pain Hematologic:  [] Easy bruising  [] Easy bleeding   [] Hypercoagulable state   [] Anemic Gastrointestinal:  [] Diarrhea   [] Vomiting  [x] Gastroesophageal reflux/heartburn   [] Difficulty swallowing. Genitourinary:  [] Chronic kidney disease   [] Difficult urination  [] Frequent urination   [] Blood in urine Skin:  [] Rashes   [] Ulcers  Psychological:  [] History of anxiety   []  History of major depression.  Physical Examination  There were no vitals filed for this visit. There is no height or weight on file to calculate BMI. Gen: WD/WN, NAD Head: [place]/AT, No  temporalis wasting. Venous lesion about 8 mm in size right lower lip   Ear/Nose/Throat: Hearing grossly intact, nares w/o erythema or drainage, pinna without lesions Eyes: PER, EOMI, sclera nonicteric.  Neck: Supple, no gross masses.  No JVD.  Pulmonary:  Good air movement, no audible wheezing, no use of accessory muscles.  Cardiac: RRR, precordium not hyperdynamic. Vascular:  scattered varicosities present bilaterally.  Moderate venous stasis changes to the legs bilaterally.  3-4+ soft pitting edema  Vessel Right Left  Radial Palpable Palpable  Gastrointestinal: soft, non-distended. No guarding/no peritoneal signs.  Musculoskeletal: M/S 5/5 throughout.  No deformity.  Neurologic: CN 2-12 intact. Pain and light touch intact in extremities.  Symmetrical.  Speech is fluent. Motor exam as listed above. Psychiatric: Judgment intact, Mood & affect appropriate for pt's clinical situation. Dermatologic: Venous rashes no ulcers noted.  No changes consistent  with cellulitis. Lymph : No lichenification or skin changes of chronic lymphedema.  CBC Lab Results  Component Value Date   WBC 6.2 02/19/2021   HGB 13.3 02/19/2021   HCT 41.0 02/19/2021   MCV 87 02/19/2021   PLT 245 02/19/2021    BMET    Component Value Date/Time   NA 140 02/19/2021 0957   K 4.5 02/19/2021 0957   CL 102 02/19/2021 0957   CO2 25 02/19/2021 0957   GLUCOSE 93 02/19/2021 0957   GLUCOSE 100 (H) 04/24/2020 1730   BUN 13 02/19/2021 0957   CREATININE 0.93 02/19/2021 0957   CALCIUM 9.8 02/19/2021 0957   GFRNONAA >60 04/24/2020 1730   GFRAA 76 02/16/2020 1017   CrCl cannot be calculated (Patient's most recent lab result is older than the maximum 21 days allowed.).  COAG Lab Results  Component Value Date   INR 1.1 04/13/2020   INR 1.03 11/14/2017    Radiology US Venous Img Lower Unilateral Left (DVT)  Result Date: 03/20/2021 CLINICAL DATA:  Left lower extremity swelling EXAM: LEFT LOWER EXTREMITY VENOUS DOPPLER  ULTRASOUND TECHNIQUE: Gray-scale sonography with compression, as well as color and duplex ultrasound, were performed to evaluate the deep venous system(s) from the level of the common femoral vein through the popliteal and proximal calf veins. COMPARISON:  None. FINDINGS: VENOUS Normal compressibility of the common femoral, superficial femoral, and popliteal veins, as well as the visualized calf veins. Visualized portions of profunda femoral vein and great saphenous vein unremarkable. No filling defects to suggest DVT on grayscale or color Doppler imaging. Doppler waveforms show normal direction of venous flow, normal respiratory plasticity and response to augmentation. Limited views of the contralateral common femoral vein are unremarkable. OTHER None. Limitations: none IMPRESSION: Negative examination for deep venous thrombosis in the left lower extremity. Electronically Signed   By: Delanna Ahmadi [name]   On: 03/20/2021 13:25     Assessment/Plan 1. Lymphedema Recommend:  No surgery or intervention at this point in time.    I have reviewed my previous discussion with the patient regarding swelling and why it causes symptoms.  Patient will continue wearing graduated compression stockings class 1 (20-30 mmHg) on a daily basis. The patient will  beginning wearing the stockings first thing in the morning and removing them in the evening. The patient is instructed specifically not to sleep in the stockings.    In addition, behavioral modification including several periods of elevation of the lower extremities during the day will be continued.  This was reviewed with the patient during the initial visit.  The patient will also continue routine exercise, especially walking on a daily basis as was discussed during the initial visit.    Despite conservative treatments including graduated compression therapy class 1 and behavioral modification including exercise and elevation the patient  has not obtained  adequate control of the lymphedema.  The patient still has stage 3 lymphedema and therefore, I believe that a lymph pump should be added to improve the control of the patient's lymphedema.  Additionally, a lymph pump is warranted because it will reduce the risk of cellulitis and ulceration in the future.  Patient should follow-up in six months    2. Venous lake of lip This lesion is bothering the patient appears to have increased in size and she is interested in having it evaluated for treatment.  I will ask Dr. [name] Roe to see her.  - Ambulatory referral to Plastic Surgery  3. Essential hypertension Continue antihypertensive medications  as already ordered, these medications have been reviewed and there are no changes at this time.   4. Gastroesophageal reflux disease without esophagitis Continue PPI as already ordered, this medication has been reviewed and there are no changes at this time.  Avoidence of caffeine and alcohol  Moderate elevation of the head of the bed    5. DDD (degenerative disc disease), lumbosacral Continue NSAID medications as already ordered, these medications have been reviewed and there are no changes at this time.  Continued activity and therapy was stressed.     Hortencia Pilar, MD  03/26/2021 12:33 PM

## 2021-03-29 ENCOUNTER — Other Ambulatory Visit: Payer: Self-pay

## 2021-03-29 ENCOUNTER — Ambulatory Visit (INDEPENDENT_AMBULATORY_CARE_PROVIDER_SITE_OTHER): Payer: Medicare Other | Admitting: Vascular Surgery

## 2021-03-29 VITALS — BP 152/80 | HR 69 | Ht 62.0 in | Wt 182.0 lb

## 2021-03-29 DIAGNOSIS — K219 Gastro-esophageal reflux disease without esophagitis: Secondary | ICD-10-CM

## 2021-03-29 DIAGNOSIS — I10 Essential (primary) hypertension: Secondary | ICD-10-CM

## 2021-03-29 DIAGNOSIS — I890 Lymphedema, not elsewhere classified: Secondary | ICD-10-CM

## 2021-03-29 DIAGNOSIS — R600 Localized edema: Secondary | ICD-10-CM

## 2021-03-29 DIAGNOSIS — M5137 Other intervertebral disc degeneration, lumbosacral region: Secondary | ICD-10-CM

## 2021-03-29 DIAGNOSIS — R238 Other skin changes: Secondary | ICD-10-CM | POA: Diagnosis not present

## 2021-03-29 DIAGNOSIS — M51379 Other intervertebral disc degeneration, lumbosacral region without mention of lumbar back pain or lower extremity pain: Secondary | ICD-10-CM

## 2021-03-30 ENCOUNTER — Encounter (INDEPENDENT_AMBULATORY_CARE_PROVIDER_SITE_OTHER): Payer: Self-pay | Admitting: Vascular Surgery

## 2021-03-30 DIAGNOSIS — I890 Lymphedema, not elsewhere classified: Secondary | ICD-10-CM | POA: Insufficient documentation

## 2021-03-30 DIAGNOSIS — R238 Other skin changes: Secondary | ICD-10-CM | POA: Insufficient documentation

## 2021-04-04 ENCOUNTER — Ambulatory Visit (INDEPENDENT_AMBULATORY_CARE_PROVIDER_SITE_OTHER): Payer: Medicare Other

## 2021-04-04 DIAGNOSIS — Z0000 Encounter for general adult medical examination without abnormal findings: Secondary | ICD-10-CM

## 2021-04-04 NOTE — Progress Notes (Signed)
Subjective:   Kimberly Andrade is a 76 y.o. female who presents for Medicare Annual (Subsequent) preventive examination.  Virtual Visit via Telephone Note  I connected with  Kimberly Andrade on 04/04/21 at 10:00 AM EST by telephone and verified that I am speaking with the correct person using two identifiers.  Location: Patient: home Provider: [facility] Persons participating in the virtual visit: [place]   I discussed the limitations, risks, security and privacy concerns of performing an evaluation and management service by telephone and the availability of in person appointments. The patient expressed understanding and agreed to proceed.  Interactive audio and video telecommunications were attempted between this nurse and patient, however failed, due to patient having technical difficulties OR patient did not have access to video capability.  We continued and completed visit with audio only.  Some vital signs may be absent or patient reported.   Clemetine Marker, LPN   Review of Systems     Cardiac Risk Factors include: advanced age (>28men, >4 women);hypertension;dyslipidemia;obesity (BMI >30kg/m2)     Objective:    There were no vitals filed for this visit. There is no height or weight on file to calculate BMI.  Advanced Directives 04/04/2021 04/24/2020 04/18/2020 04/13/2020 04/03/2020 03/10/2019 01/20/2018  Does Patient Have a Medical Advance Directive? No No No No No No No  Would patient like information on creating a medical advance directive? Yes (MAU/Ambulatory/Procedural Areas - Information given) - No - Patient declined - No - Patient declined Yes (MAU/Ambulatory/Procedural Areas - Information given) No - Patient declined    Current Medications (verified) Outpatient Encounter Medications as of 04/04/2021  Medication Sig   albuterol (VENTOLIN HFA) 108 (90 Base) MCG/ACT inhaler SMARTSIG:2 Puff(s) By Mouth Every 6 Hours PRN   Ascorbic Acid (VITAMIN C PO)  Take 500 mg by mouth daily.   aspirin EC 81 MG tablet Take 1 tablet (81 mg total) by mouth 2 (two) times daily after a meal.   Calcium Carbonate-Vit D-Min (CALCIUM 600+D3 PLUS MINERALS) 600-800 MG-UNIT TABS Take 1 tablet by mouth daily.   cholecalciferol (VITAMIN D) 25 MCG (1000 UNIT) tablet Take 1,000 Units by mouth daily.   gabapentin (NEURONTIN) 300 MG capsule Take 2 capsules (600 mg total) by mouth 2 (two) times daily. (Patient taking differently: Take 300 mg by mouth daily.)   losartan (COZAAR) 50 MG tablet Take 1 tablet (50 mg total) by mouth daily.   Magnesium 250 MG TABS Take 250 mg by mouth daily.    mesalamine (PENTASA) 500 MG CR capsule Take 2,000 mg by mouth 2 (two) times daily as needed (Crohn's).   Multiple Vitamins-Minerals (MULTIVITAMIN WITH MINERALS) tablet Take 1 tablet by mouth daily.   NON FORMULARY BiPap nightly   oxybutynin (DITROPAN XL) 15 MG 24 hr tablet Take 1 tablet (15 mg total) by mouth at bedtime.   Potassium 99 MG TABS Take 99 mg by mouth daily.    pravastatin (PRAVACHOL) 40 MG tablet Take 1 tablet (40 mg total) by mouth daily.   PROLIA 60 MG/ML SOSY injection Inject 60 mg into the skin every 6 (six) months.   vitamin B-12 (CYANOCOBALAMIN) 1000 MCG tablet Take 1,000 mcg by mouth daily.   zinc gluconate 50 MG tablet Take 50 mg by mouth daily.   [DISCONTINUED] doxycycline (VIBRAMYCIN) 100 MG capsule Take 100 mg by mouth 2 (two) times daily.   [DISCONTINUED] promethazine-dextromethorphan (PROMETHAZINE-DM) 6.25-15 MG/5ML syrup Take 5 mLs by mouth 4 (four) times daily as needed for cough.  No facility-administered encounter medications on file as of 04/04/2021.    Allergies (verified) Fosamax [alendronate sodium], Lipitor [atorvastatin], Codeine, and Hydrocodone   History: Past Medical History:  Diagnosis Date   Arthritis    B12 deficiency    Crohn disease ([place])    Crohn's disease ([place])    Diverticulosis    DJD (degenerative joint disease)    DJD  (degenerative joint disease)    Essential hypertension 08/16/2019   Full dentures    GERD (gastroesophageal reflux disease)    pt deneis    Heart palpitations    Hypercholesterolemia    Hyperlipidemia    Incontinence of urine    Nocturnal leg cramps    Osteopenia    Osteoporosis    Reflux    Sleep apnea    uses a cpap   Wears glasses    Past Surgical History:  Procedure Laterality Date   ABDOMINAL HYSTERECTOMY     APPENDECTOMY     CARPEL TUNNEL     CHOLECYSTECTOMY     COLONOSCOPY WITH PROPOFOL N/A 03/11/2016   Procedure: COLONOSCOPY WITH PROPOFOL;  Surgeon: Lollie Sails, MD;  Location: [facility] ENDOSCOPY;  Service: Endoscopy;  Laterality: N/A;   DILATION AND CURETTAGE OF UTERUS     ESOPHAGOGASTRODUODENOSCOPY N/A 03/11/2016   Procedure: ESOPHAGOGASTRODUODENOSCOPY (EGD);  Surgeon: Lollie Sails, MD;  Location: [facility] ENDOSCOPY;  Service: Endoscopy;  Laterality: N/A;   ESOPHAGOGASTRODUODENOSCOPY (EGD) WITH PROPOFOL N/A 11/11/2017   Procedure: ESOPHAGOGASTRODUODENOSCOPY (EGD) WITH PROPOFOL;  Surgeon: Lollie Sails, MD;  Location[facility] END[facility]COPY;  Service: Endoscopy;  Laterality: N/A;   ESOPHAGOGASTRODUODENOSCOPY (EGD) WITH PROPOFOL N/A 01/20/2018   Procedure: ESOPHAGOGASTRODUODENOSCOPY (EGD) WITH PROPOFOL;  Surgeon: Lollie Sails, MD;  Location: [facility] ENDOSCOPY;  Service: Endoscopy;  Laterality: N/A;   FOOT SURGERY Right    HAMMER TOE SURGERY     JOINT REPLACEMENT Right 2006   shoulder   KNEE ARTHROSCOPY Right 05/25/2013   Procedure: ARTHROSCOPY KNEE;  Surgeon: Hessie Dibble, MD;  Location: [place];  Service: Orthopedics;  Laterality: Right;  medial and lateral meniscal debridment and chondroplasty   righti shoulder replacement      ROBOTIC ASSISTED LAPAROSCOPIC REPAIR OF PARAESOPHAGEAL HERNIA  07/16/2018   SHOULDER ARTHROSCOPY WITH ROTATOR CUFF REPAIR AND SUBACROMIAL DECOMPRESSION Left 08/14/2017   Procedure: SHOULDER ARTHROSCOPY WITH ROTATOR CUFF REPAIR  AND SUBACROMIAL DECOMPRESSION;  Surgeon: Tania Ade, MD;  Location: [place];  Service: Orthopedics;  Laterality: Left;   SHOULDER SURGERY Right    X 3   TOTAL HIP ARTHROPLASTY Left 11/25/2017   Procedure: LEFT TOTAL HIP ARTHROPLASTY ANTERIOR APPROACH;  Surgeon: Melrose Nakayama, MD;  Location: [place];  Service: Orthopedics;  Laterality: Left;   TOTAL KNEE ARTHROPLASTY Right 04/18/2020   Procedure: RIGHT TOTAL KNEE ARTHROPLASTY;  Surgeon: Melrose Nakayama, MD;  Location: [facility];  Service: Orthopedics;  Laterality: Right;   Family History  Problem Relation Age of Onset   Heart disease Mother    Esophageal cancer Father    Social History   Socioeconomic History   Marital status: Married    Spouse name: Not on file   Number of children: 3   Years of education: Not on file   Highest education level: Associate degree: academic program  Occupational History   Occupation: retired  Tobacco Use   Smoking status: Former    Types: Cigarettes    Quit date: 12/29/2001    Years since quitting: 19.2   Smokeless tobacco: Never  Vaping Use  Vaping Use: Never used  Substance and Sexual Activity   Alcohol use: No   Drug use: No   Sexual activity: Not on file  Other Topics Concern   Not on file  Social History Narrative   Not on file   Social Determinants of Health   Financial Resource Strain: Low Risk    Difficulty of Paying Living Expenses: Not hard at all  Food Insecurity: No Food Insecurity   Worried About Charity fundraiser in the Last Year: Never true   [place] in the Last Year: Never true  Transportation Needs: No Transportation Needs   Lack of Transportation (Medical): No   Lack of Transportation (Non-Medical): No  Physical Activity: Insufficiently Active   Days of Exercise per Week: 3 days   Minutes of Exercise per Session: 30 min  Stress: No Stress Concern Present   Feeling of Stress : Not at all  Social Connections: Moderately Integrated   Frequency of  Communication with Friends and Family: More than three times a week   Frequency of Social Gatherings with Friends and Family: More than three times a week   Attends Religious Services: More than 4 times per year   Active Member of [employer] or Organizations: No   Attends Music therapist: Never   Marital Status: Married    Tobacco Counseling Counseling given: Not Answered   Clinical Intake:  Pre-visit preparation completed: Yes  Pain : No/denies pain     Nutritional Risks: None Diabetes: No  How often do you need to have someone help you when you read instructions, pamphlets, or other written materials from your doctor or pharmacy?: 1 - Never    Interpreter Needed?: No  Information entered by :: Clemetine Marker LPN   Activities of Daily Living In your present state of health, do you have any difficulty performing the following activities: 04/04/2021 03/20/2021  Hearing? N N  Vision? N N  Difficulty concentrating or making decisions? N N  Walking or climbing stairs? Y Y  Dressing or bathing? N N  Doing errands, shopping? N N  Preparing Food and eating ? N -  Using the Toilet? N -  In the past six months, have you accidently leaked urine? Y -  Comment wears pads for protection -  Do you have problems with loss of bowel control? N -  Managing your Medications? N -  Managing your Finances? N -  Housekeeping or managing your Housekeeping? N -  Some recent data might be hidden    Patient Care Team: Glean Hess, MD as PCP - General (Internal Medicine) Corey Skains, MD as Consulting Physician (Cardiology) Erby Pian, MD as Referring Physician (Pulmonary Disease) Gabriel Carina Betsey Holiday, MD as Physician Assistant (Endocrinology) Melrose Nakayama, MD as Consulting Physician (Orthopedic Surgery) Adron Bene (Gastroenterology) Brendolyn Patty, MD (Dermatology) Delana Meyer, Dolores Lory, MD (Vascular Surgery)  Indicate any recent Medical Services you may  have received from other than [facility] providers in the past year (date may be approximate).     Assessment:   This is a routine wellness examination for Kimberly Andrade.  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; hearing evaluation done 2021 Vision Screening - Comments:: [facility] screen[facility]  Dietary issues and exercise activities discussed: Current Exercise Habits: Home exercise routine, Type of exercise: walking, Time (Minutes): 30, Frequency (Times/Week): 3, Weekly Exercise (Minutes/Week): 90, Intensity: Mild, Exercise limited by: None identified  Goals Addressed   None   Depression Screen PHQ 2/9 Scores 04/04/2021 03/20/2021 03/09/2021 02/19/2021 04/03/2020 02/16/2020 08/16/2019  PHQ - 2 Score 0 0 0 1 0 0 1  PHQ- 9 Score 1 3 0 4 - 3 3    Fall Risk Fall Risk  04/04/2021 03/20/2021 03/09/2021 02/19/2021 04/03/2020  Falls in the past year? 0 0 0 0 0  Number falls in past yr: 0 0 0 0 0  Injury with Fall? 0 0 0 0 0  Risk for fall due to : No Fall Risks No Fall Risks No Fall Risks No Fall Risks Orthopedic patient  Follow up Falls prevention discussed Falls evaluation completed Falls evaluation completed Falls evaluation completed Falls prevention discussed    FALL RISK PREVENTION PERTAINING TO THE HOME:  Any stairs in or around the home? Yes  If so, are there any without handrails? Yes - 3 steps out front Home free of loose throw rugs in walkways, pet beds, electrical cords, etc? Yes  Adequate lighting in your home to reduce risk of falls? Yes   ASSISTIVE DEVICES UTILIZED TO PREVENT FALLS:  Life alert? No  Use of a cane, walker or w/c? No  Grab bars in the bathroom? Yes  Shower chair or bench in shower? Yes  Elevated toilet seat or a handicapped toilet? Yes   TIMED UP AND GO:  Was the test performed? No . Telephonic visit  Cognitive Function: Normal cognitive status assessed by direct observation by this Nurse Health  Advisor. No abnormalities found.       6CIT Screen 03/10/2019  What Year? 0 points  What month? 0 points  What time? 0 points  Count back from 20 0 points  Months in reverse 0 points  Repeat phrase 0 points  Total Score 0    Immunizations Immunization History  Administered Date(s) Administered   Fluad Quad(high Dose 65+) 12/11/2018, 02/16/2020, 02/19/2021   Influenza, High Dose Seasonal PF 11/27/2017   Influenza, Seasonal, Injecte, Preservative Fre 11/27/2017   Pneumococcal Conjugate-13 06/29/2015   Pneumococcal Polysaccharide-23 04/29/2011   Tdap 07/10/2016   Zoster Recombinat (Shingrix) 10/19/2020, 01/19/2021   Zoster, Live 02/01/2014    TDAP status: Up to date  Flu Vaccine status: Up to date  Pneumococcal vaccine status: Up to date  Covid-19 vaccine status: Completed vaccines  Qualifies for Shingles Vaccine? Yes   Zostavax completed Yes   Shingrix Completed?: Yes  Screening Tests Health Maintenance  Topic Date Due   COVID-19 Vaccine (1) 04/05/2021 (Originally 01/16/1946)   MAMMOGRAM  09/25/2021   COLONOSCOPY (Pts 45-18yrs Insurance coverage will need to be confirmed)  06/15/2022   TETANUS/TDAP  07/11/2026   Pneumonia Vaccine 75+ Years old  Completed   INFLUENZA VACCINE  Completed   DEXA SCAN  Completed   Hepatitis C Screening  Completed   Zoster Vaccines- Shingrix  Completed   HPV VACCINES  Aged Out    Health Maintenance  There are no preventive care reminders to display for this patient.  Colorectal cancer screening: Type of screening: Colonoscopy. Completed 06/15/19. Repeat every 3 years  Mammogram status: Completed 09/25/20. Repeat every year  Bone Density status: Completed 09/27/20. Results reflect: Bone density results: OSTEOPOROSIS. Repeat every 2 years.  Lung Cancer Screening: (Low Dose CT Chest recommended if Age 37-80 years, 30 pack-year currently smoking OR have quit w/in 15years.) does not qualify.   Additional Screening:  Hepatitis C  Screening: does qualify; Completed 08/06/16  Vision Screening: Recommended annual ophthalmology exams for early detection  of glaucoma and other disorders of the eye. Is the patient up to date with their annual eye exam?  Yes  Who is the provider or what is the name of the office in which the patient attends annual eye exams? [facility]   Dental Screening: Recommended annual dental exams for proper oral hygiene  Community Resource Referral / Chronic Care Management: CRR required this visit?  No   CCM required this visit?  No      Plan:     I have personally reviewed and noted the following in the patients chart:   Medical and social history Use of alcohol, tobacco or illicit drugs  Current medications and supplements including opioid prescriptions.  Functional ability and status Nutritional status Physical activity Advanced directives List of other physicians Hospitalizations, surgeries, and ER visits in previous 12 months Vitals Screenings to include cognitive, depression, and falls Referrals and appointments  In addition, I have reviewed and discussed with patient certain preventive protocols, quality metrics, and best practice recommendations. A written personalized care plan for preventive services as well as general preventive health recommendations were provided to patient.     Clemetine Marker, LPN   3/0/0511   Nurse Notes: none

## 2021-04-04 NOTE — Patient Instructions (Signed)
Kimberly Andrade , Thank you for taking time to come for your Medicare Wellness Visit. I appreciate your ongoing commitment to your health goals. Please review the following plan we discussed and let me know if I can assist you in the future.   Screening recommendations/referrals: Colonoscopy: done 06/15/19. Repeat 06/2019 Mammogram: done 09/25/20 Bone Density: done 09/27/20 Recommended yearly ophthalmology/optometry visit for glaucoma screening and checkup Recommended yearly dental visit for hygiene and checkup  Vaccinations: Influenza vaccine: done 02/19/21 Pneumococcal vaccine: done 06/26/15 Tdap vaccine: done 07/10/16 Shingles vaccine: done 10/19/20 & 01/19/21   Covid-19: declined  Advanced directives: Advance directive discussed with you today. I have provided a copy for you to complete at home and have notarized. Once this is complete please bring a copy in to our office so we can scan it into your chart.   Conditions/risks identified: Keep up the great work!  Next appointment: Follow up in one year for your annual wellness visit    Preventive Care 65 Years and Older, Female Preventive care refers to lifestyle choices and visits with your health care provider that can promote health and wellness. What does preventive care include? A yearly physical exam. This is also called an annual well check. Dental exams once or twice a year. Routine eye exams. Ask your health care provider how often you should have your eyes checked. Personal lifestyle choices, including: Daily care of your teeth and gums. Regular physical activity. Eating a healthy diet. Avoiding tobacco and drug use. Limiting alcohol use. Practicing safe sex. Taking low-dose aspirin every day. Taking vitamin and mineral supplements as recommended by your health care provider. What happens during an annual well check? The services and screenings done by your health care provider during your annual well check will depend  on your age, overall health, lifestyle risk factors, and family history of disease. Counseling  Your health care provider may ask you questions about your: Alcohol use. Tobacco use. Drug use. Emotional well-being. Home and relationship well-being. Sexual activity. Eating habits. History of falls. Memory and ability to understand (cognition). Work and work Statistician. Reproductive health. Screening  You may have the following tests or measurements: Height, weight, and BMI. Blood pressure. Lipid and cholesterol levels. These may be checked every 5 years, or more frequently if you are over 45 years old. Skin check. Lung cancer screening. You may have this screening every year starting at age 27 if you have a 30-pack-year history of smoking and currently smoke or have quit within the past 15 years. Fecal occult blood test (FOBT) of the stool. You may have this test every year starting at age 43. Flexible sigmoidoscopy or colonoscopy. You may have a sigmoidoscopy every 5 years or a colonoscopy every 10 years starting at age 43. Hepatitis C blood test. Hepatitis B blood test. Sexually transmitted disease (STD) testing. Diabetes screening. This is done by checking your blood sugar (glucose) after you have not eaten for a while (fasting). You may have this done every 1-3 years. Bone density scan. This is done to screen for osteoporosis. You may have this done starting at age 47. Mammogram. This may be done every 1-2 years. Talk to your health care provider about how often you should have regular mammograms. Talk with your health care provider about your test results, treatment options, and if necessary, the need for more tests. Vaccines  Your health care provider may recommend certain vaccines, such as: Influenza vaccine. This is recommended every year. Tetanus, diphtheria, and acellular pertussis (  Tdap, Td) vaccine. You may need a Td booster every 10 years. Zoster vaccine. You may need  this after age 46. Pneumococcal 13-valent conjugate (PCV13) vaccine. One dose is recommended after age 36. Pneumococcal polysaccharide (PPSV23) vaccine. One dose is recommended after age 71. Talk to your health care provider about which screenings and vaccines you need and how often you need them. This information is not intended to replace advice given to you by your health care provider. Make sure you discuss any questions you have with your health care provider. Document Released: 03/17/2015 Document Revised: 11/08/2015 Document Reviewed: 12/20/2014 Elsevier Interactive Patient Education  2017 [place] Prevention in the Home Falls can cause injuries. They can happen to people of all ages. There are many things you can do to make your home safe and to help prevent falls. What can I do on the outside of my home? Regularly fix the edges of walkways and driveways and fix any cracks. Remove anything that might make you trip as you walk through a door, such as a raised step or threshold. Trim any bushes or trees on the path to your home. Use bright outdoor lighting. Clear any walking paths of anything that might make someone trip, such as rocks or tools. Regularly check to see if handrails are loose or broken. Make sure that both sides of any steps have handrails. Any raised decks and porches should have guardrails on the edges. Have any leaves, snow, or ice cleared regularly. Use sand or salt on walking paths during winter. Clean up any spills in your garage right away. This includes oil or grease spills. What can I do in the bathroom? Use night lights. Install grab bars by the toilet and in the tub and shower. Do not use towel bars as grab bars. Use non-skid mats or decals in the tub or shower. If you need to sit down in the shower, use a plastic, non-slip stool. Keep the floor dry. Clean up any water that spills on the floor as soon as it happens. Remove soap buildup in the tub  or shower regularly. Attach bath mats securely with double-sided non-slip rug tape. Do not have throw rugs and other things on the floor that can make you trip. What can I do in the bedroom? Use night lights. Make sure that you have a light by your bed that is easy to reach. Do not use any sheets or blankets that are too big for your bed. They should not hang down onto the floor. Have a firm chair that has side arms. You can use this for support while you get dressed. Do not have throw rugs and other things on the floor that can make you trip. What can I do in the kitchen? Clean up any spills right away. Avoid walking on wet floors. Keep items that you use a lot in easy-to-reach places. If you need to reach something above you, use a strong step stool that has a grab bar. Keep electrical cords out of the way. Do not use floor polish or wax that makes floors slippery. If you must use wax, use non-skid floor wax. Do not have throw rugs and other things on the floor that can make you trip. What can I do with my stairs? Do not leave any items on the stairs. Make sure that there are handrails on both sides of the stairs and use them. Fix handrails that are broken or loose. Make sure that handrails are  as long as the stairways. Check any carpeting to make sure that it is firmly attached to the stairs. Fix any carpet that is loose or worn. Avoid having throw rugs at the top or bottom of the stairs. If you do have throw rugs, attach them to the floor with carpet tape. Make sure that you have a light switch at the top of the stairs and the bottom of the stairs. If you do not have them, ask someone to add them for you. What else can I do to help prevent falls? Wear shoes that: Do not have high heels. Have rubber bottoms. Are comfortable and fit you well. Are closed at the toe. Do not wear sandals. If you use a stepladder: Make sure that it is fully opened. Do not climb a closed stepladder. Make  sure that both sides of the stepladder are locked into place. Ask someone to hold it for you, if possible. Clearly mark and make sure that you can see: Any grab bars or handrails. First and last steps. Where the edge of each step is. Use tools that help you move around (mobility aids) if they are needed. These include: Canes. Walkers. Scooters. Crutches. Turn on the lights when you go into a dark area. Replace any light bulbs as soon as they burn out. Set up your furniture so you have a clear path. Avoid moving your furniture around. If any of your floors are uneven, fix them. If there are any pets around you, be aware of where they are. Review your medicines with your doctor. Some medicines can make you feel dizzy. This can increase your chance of falling. Ask your doctor what other things that you can do to help prevent falls. This information is not intended to replace advice given to you by your health care provider. Make sure you discuss any questions you have with your health care provider. Document Released: 12/15/2008 Document Revised: 07/27/2015 Document Reviewed: 03/25/2014 Elsevier Interactive Patient Education  2017 [employer].

## 2021-04-13 ENCOUNTER — Telehealth: Payer: Self-pay | Admitting: Internal Medicine

## 2021-04-13 NOTE — Telephone Encounter (Signed)
Spoke to pt let her know a form will be mailed out. Pt verbalized understanding.  KP

## 2021-04-13 NOTE — Telephone Encounter (Signed)
Copied from [facility] [place] #[id number]. Topic: General - Other >> Apr 13, 2021 11:09 AM Oneta Rack wrote: Patient spilled coffee on her Advance Directive forms and would like nurse to mail to home, please note when mailed.

## 2021-04-18 ENCOUNTER — Emergency Department (HOSPITAL_COMMUNITY)
Admission: EM | Admit: 2021-04-18 | Discharge: 2021-04-19 | Disposition: A | Discharge status: Home / Self Care | Payer: Medicare Other | Attending: Emergency Medicine | Admitting: Emergency Medicine

## 2021-04-18 DIAGNOSIS — R059 Cough, unspecified: Secondary | ICD-10-CM | POA: Insufficient documentation

## 2021-04-18 DIAGNOSIS — Z5321 Procedure and treatment not carried out due to patient leaving prior to being seen by health care provider: Secondary | ICD-10-CM | POA: Insufficient documentation

## 2021-04-18 DIAGNOSIS — R0789 Other chest pain: Secondary | ICD-10-CM | POA: Insufficient documentation

## 2021-04-18 DIAGNOSIS — Z8616 Personal history of COVID-19: Secondary | ICD-10-CM | POA: Insufficient documentation

## 2021-04-18 NOTE — ED Triage Notes (Signed)
Pt arrived from home with [facility] with complaints of chest pressure that started tonight and got worse after having a coughing spell. States that she was dx with Covid in January and has had a cough that hasn't went away. Received 581ml N/S via EMS and 4 aspirin were taken before arrival.

## 2021-04-19 ENCOUNTER — Emergency Department (HOSPITAL_COMMUNITY): Payer: Medicare Other

## 2021-04-19 ENCOUNTER — Other Ambulatory Visit: Payer: Self-pay

## 2021-04-19 ENCOUNTER — Encounter (HOSPITAL_COMMUNITY): Payer: Self-pay

## 2021-04-19 DIAGNOSIS — I2089 Other forms of angina pectoris: Secondary | ICD-10-CM | POA: Insufficient documentation

## 2021-04-19 DIAGNOSIS — I208 Other forms of angina pectoris: Secondary | ICD-10-CM | POA: Insufficient documentation

## 2021-05-16 ENCOUNTER — Encounter: Payer: Self-pay | Admitting: Internal Medicine

## 2021-05-29 ENCOUNTER — Other Ambulatory Visit: Payer: Self-pay | Admitting: Internal Medicine

## 2021-05-29 DIAGNOSIS — R32 Unspecified urinary incontinence: Secondary | ICD-10-CM

## 2021-05-30 NOTE — Telephone Encounter (Signed)
Requested Prescriptions  ?Pending Prescriptions Disp Refills  ?? oxybutynin (DITROPAN XL) 15 MG 24 hr tablet [Pharmacy Med Name: OXYBUTYNIN CHLORIDE 15MG SA TAB] 90 tablet 3  ?  Sig: TAKE ONE TABLET BY MOUTH EVERY DAY AT BEDTIME  ?  ? Urology:  Bladder Agents Passed - 05/29/2021 10:27 AM  ?  ?  Passed - Valid encounter within last 12 months  ?  Recent Outpatient Visits   ?      ? 2 months ago Subacute cough  ? [facility] Glean Hess, MD  ? 2 months ago COVID-19 virus infection  ? [facility] Glean Hess, MD  ? 3 months ago Essential hypertension  ? [facility] Glean Hess, MD  ? 9 months ago Essential hypertension  ? [facility] Glean Hess, MD  ? 1 year ago Essential hypertension  ? [facility] Glean Hess, MD  ?  ?  ?Future Appointments   ?        ? In 3 months Army Melia Jesse Sans, MD [facility], [place]  ?  ? ?  ?  ?  ? ? ?

## 2021-06-26 ENCOUNTER — Ambulatory Visit (INDEPENDENT_AMBULATORY_CARE_PROVIDER_SITE_OTHER): Payer: Medicare Other | Admitting: Dermatology

## 2021-06-26 DIAGNOSIS — D229 Melanocytic nevi, unspecified: Secondary | ICD-10-CM

## 2021-06-26 DIAGNOSIS — Z1283 Encounter for screening for malignant neoplasm of skin: Secondary | ICD-10-CM | POA: Diagnosis not present

## 2021-06-26 DIAGNOSIS — D2271 Melanocytic nevi of right lower limb, including hip: Secondary | ICD-10-CM

## 2021-06-26 DIAGNOSIS — L821 Other seborrheic keratosis: Secondary | ICD-10-CM

## 2021-06-26 DIAGNOSIS — D225 Melanocytic nevi of trunk: Secondary | ICD-10-CM | POA: Diagnosis not present

## 2021-06-26 DIAGNOSIS — D2239 Melanocytic nevi of other parts of face: Secondary | ICD-10-CM | POA: Diagnosis not present

## 2021-06-26 DIAGNOSIS — Z872 Personal history of diseases of the skin and subcutaneous tissue: Secondary | ICD-10-CM

## 2021-06-26 DIAGNOSIS — L578 Other skin changes due to chronic exposure to nonionizing radiation: Secondary | ICD-10-CM

## 2021-06-26 DIAGNOSIS — L814 Other melanin hyperpigmentation: Secondary | ICD-10-CM

## 2021-06-26 DIAGNOSIS — D1800 Hemangioma unspecified site: Secondary | ICD-10-CM

## 2021-06-26 DIAGNOSIS — L918 Other hypertrophic disorders of the skin: Secondary | ICD-10-CM

## 2021-06-26 DIAGNOSIS — D2372 Other benign neoplasm of skin of left lower limb, including hip: Secondary | ICD-10-CM

## 2021-06-26 NOTE — Patient Instructions (Signed)
Melanoma ABCDEs ? ?Melanoma is the most dangerous type of skin cancer, and is the leading cause of death from skin disease.  You are more likely to develop melanoma if you: ?Have light-colored skin, light-colored eyes, or red or blond hair ?Spend a lot of time in the sun ?Tan regularly, either outdoors or in a tanning bed ?Have had blistering sunburns, especially during childhood ?Have a close family member who has had a melanoma ?Have atypical moles or large birthmarks ? ?Early detection of melanoma is key since treatment is typically straightforward and cure rates are extremely high if we catch it early.  ? ?The first sign of melanoma is often a change in a mole or a new dark spot.  The ABCDE system is a way of remembering the signs of melanoma. ? ?A for asymmetry:  The two halves do not match. ?B for border:  The edges of the growth are irregular. ?C for color:  A mixture of colors are present instead of an even brown color. ?D for diameter:  Melanomas are usually (but not always) greater than 93m - the size of a pencil eraser. ?E for evolution:  The spot keeps changing in size, shape, and color. ? ?Please check your skin once per month between visits. You can use a small mirror in front and a large mirror behind you to keep an eye on the back side or your body.  ? ?If you see any new or changing lesions before your next follow-up, please call to schedule a visit. ? ?Please continue daily skin protection including broad spectrum sunscreen SPF 30+ to sun-exposed areas, reapplying every 2 hours as needed when you're outdoors.  ? ?Staying in the shade or wearing long sleeves, sun glasses (UVA+UVB protection) and wide brim hats (4-inch brim around the entire circumference of the hat) are also recommended for sun protection.   ? ? ?If You Need Anything After Your Visit ? ?If you have any questions or concerns for your doctor, please call our main line at [phone] press option 4 to reach your doctor's medical  assistant. If no one answers, please leave a voicemail as directed and we will return your call as soon as possible. Messages left after 4 pm will be answered the following business day.  ? ?You may also send [place] message via MyChart. We typically respond to MyChart messages within 1-2 business days. ? ?For prescription refills, please ask your pharmacy to contact our office. Our fax number is [phone] ? ?If you have an urgent issue when the clinic is closed that cannot wait until the next business day, you can page your doctor at the number below.   ? ?Please note that while we do our best to be available for urgent issues outside of office hours, we are not available 24/7.  ? ?If you have an urgent issue and are unable to reach [place] you may choose to seek medical care at your doctor's office, retail clinic, urgent care center, or emergency room. ? ?If you have a medical emergency, please immediately call 911 or go to the emergency department. ? ?Pager Numbers ? ?- Dr. KNehemiah Massed [phone]? ?- Dr. MLaurence Ferrari [phone]? ?- Dr. SNicole Kindred [phone]? ?In the event of inclement weather, please call our main line at [phone] an update on the status of any delays or closures. ? ?Dermatology Medication Tips: ?Please keep the boxes that topical medications come in in order to help keep track of the instructions  about where and how to use these. Pharmacies typically print the medication instructions only on the boxes and not directly on the medication tubes.  ? ?If your medication is too expensive, please contact our office at [phone] option 4 or send [place] a message through [facility] [place].  ? ?We are unable to tell what your co-pay for medications will be in advance as this is different depending on your insurance coverage. However, we may be able to find a substitute medication at lower cost or fill out paperwork to get insurance to cover a needed medication.  ? ?If a prior authorization is required to get your  medication covered by your insurance company, please allow [place] 1-2 business days to complete this process. ? ?Drug prices often vary depending on where the prescription is filled and some pharmacies may offer cheaper prices. ? ?The website www.goodrx.com contains coupons for medications through different pharmacies. The prices here do not account for what the cost may be with help from insurance (it may be cheaper with your insurance), but the website can give you the price if you did not use any insurance.  ?- You can print the associated coupon and take it with your prescription to the pharmacy.  ?- You may also stop by our office during regular business hours and pick up a GoodRx coupon card.  ?- If you need your prescription sent electronically to a different pharmacy, notify our office through [facility] or by phone at [phone] option 4. ? ? ? ? ?Si Usted Necesita Algo Despu?s de Su Visita ? ?Tambi?n puede enviarnos un mensaje a trav?s de MyChart. Por lo general respondemos a los mensajes de MyChart en el transcurso de 1 a 2 d?as h?biles. ? ?Para renovar recetas, por favor pida a su farmacia que se ponga en contacto con nuestra oficina. Nuestro n?mero de fax es el [phone]. ? ?Si tiene un asunto urgente cuando la cl?nica est? cerrada y que no puede esperar hasta el siguiente d?a h?bil, puede llamar/localizar a su doctor(a) al n?mero que aparece a continuaci?n.  ? ?Por favor, tenga en cuenta que aunque hacemos todo lo posible para estar disponibles para asuntos urgentes fuera del horario de oficina, no estamos disponibles las 24 horas del d?a, los 7 d?as de la semana.  ? ?Si tiene un problema urgente y no puede comunicarse con nosotros, puede optar por buscar atenci?n m?dica  en el consultorio de su doctor(a), en una cl?nica privada, en un centro de atenci?n urgente o en una sala de emergencias. ? ?Si tiene Engineer, maintenance (IT) m?dica, por favor llame inmediatamente al 911 o vaya a la sala de  emergencias. ? ?N?meros de b?per ? ?- Dr. Nehemiah Massed: [phone] ? ?- Dra. Moye: [phone] ? ?- Dra. Nicole Kindred: [phone] ? ?En caso de inclemencias del tiempo, por favor llame a nuestra l?nea principal al [phone] para una actualizaci?n sobre el estado de cualquier retraso o cierre. ? ?Consejos para la medicaci?n en dermatolog?a: ?Por favor, guarde las cajas en las que vienen los medicamentos de uso t?pico para ayudarle a seguir las instrucciones sobre d?nde y c?mo usarlos. Las farmacias generalmente imprimen las instrucciones del medicamento s?lo en las cajas y no directamente en los tubos del [place].  ? ?Si su medicamento es muy caro, por favor, p?ngase en contacto con Zigmund Daniel llamando al [phone] y presione la opci?n 4 o env?enos un mensaje a trav?s de MyChart.  ? ?No podemos decirle cu?l ser? su copago por los medicamentos por adelantado ya que  esto es diferente dependiendo de la cobertura de su seguro. Sin embargo, es posible que podamos encontrar un medicamento sustituto a Electrical engineer un formulario para que el seguro cubra el medicamento que se considera necesario.  ? ?Si se requiere Ardelia Mems autorizaci?n previa para que su compa??a de seguros [place] su medicamento, por favor perm?tanos de 1 a 2 d?as h?biles para completar este proceso. ? ?Los precios de los medicamentos var?an con frecuencia dependiendo del Environmental consultant de d?nde se surte la receta y alguna farmacias pueden ofrecer precios m?s baratos. ? ?El sitio web www.goodrx.com tiene cupones para medicamentos de Airline pilot. Los precios aqu? no tienen en cuenta lo que podr?a costar con la ayuda del seguro (puede ser m?s barato con su seguro), pero el sitio web puede darle el precio si no utiliz? ning?n seguro.  ?- Puede imprimir el cup?n correspondiente y llevarlo con su receta a la farmacia.  ?- Tambi?n puede pasar por nuestra oficina durante el horario de atenci?n regular y recoger una tarjeta de cupones de GoodRx.  ?- Si  necesita que su receta se env?e electr?nicamente a Chiropodist, informe a nuestra oficina a trav?s de MyChart de [place] o por tel?fono llamando al [phone] y presione la opci?n 4. ? ?

## 2021-06-26 NOTE — Progress Notes (Signed)
? ?Follow-Up Visit ?  ?Subjective  ?Kimberly Andrade is a 76 y.o. female who presents for the following: Annual Exam (1 year tbse , hx aks, isks, mole at chest that looks different. ). ? ?The patient presents for Total-Body Skin Exam (TBSE) for skin cancer screening and mole check.  The patient has spots, moles and lesions to be evaluated, some may be new or changing and the patient has concerns that these could be cancer. ? ?The following portions of the chart were reviewed this encounter and updated as appropriate:   ?  ? ?Review of Systems: No other skin or systemic complaints except as noted in HPI or Assessment and Plan. ? ? ?Objective  ?Well appearing patient in no apparent distress; mood and affect are within normal limits. ? ?A full examination was performed including scalp, head, eyes, ears, nose, lips, neck, chest, axillae, abdomen, back, buttocks, bilateral upper extremities, bilateral lower extremities, hands, feet, fingers, toes, fingernails, and toenails. All findings within normal limits unless otherwise noted below. ? ?right calf ?6  x  4 mm pink brown pap darker inferior, present for years, no changes noted.  ? ?right intermammary ?6 mm speckled brown macule  ? ?Left Malar Cheek ?5 mm pink flesh papule  ? ?Right Upper Back ?3 mm 'V" shaped brown macule  ? ? ?Assessment & Plan  ?Nevus (3) ?right calf; right intermammary; Left Malar Cheek ? ?Benign-appearing.  Stable. Observation.  Call clinic for new or changing lesions.  Recommend daily use of broad spectrum spf 30+ sunscreen to sun-exposed areas.  ? ? ?Lentigo ?Right Upper Back ? ?Benign-appearing.  Observation.  Call clinic for new or changing lesions.  Recommend daily use of broad spectrum spf 30+ sunscreen to sun-exposed areas.  ? ? ?Lentigines ?- Scattered tan macules ?- Due to sun exposure ?- Benign-appearing, observe ?- Recommend daily broad spectrum sunscreen SPF 30+ to sun-exposed areas, reapply every 2 hours as needed. ?- Call for  any changes ? ?Seborrheic Keratoses ?- Stuck-on, waxy, tan-brown papules and/or plaques  ?- Benign-appearing ?- Discussed benign etiology and prognosis. ?- Observe ?- Call for any changes ? ?Acrochordons (Skin Tags) ?- Fleshy, skin-colored pedunculated papules ?- Benign appearing.  ?- Observe. ?- If desired, they can be removed with an in office procedure that is not covered by insurance. ?- Please call the clinic if you notice any new or changing lesions. ? ?Dermatofibroma ?- Firm pink/brown papulenodule with dimple sign at left medial ankle  ?- Benign appearing ?- Call for any changes ? ?Melanocytic Nevi ?- Tan-brown and/or pink-flesh-colored symmetric macules and papules ?- Benign appearing on exam today ?- Observation ?- Call clinic for new or changing moles ?- Recommend daily use of broad spectrum spf 30+ sunscreen to sun-exposed areas.  ? ?Hemangiomas ?- Red papules ?- Discussed benign nature ?- Observe ?- Call for any changes ? 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  ?- Staying in the shade or wearing long sleeves, sun glasses (UVA+UVB protection) and wide brim hats (4-inch brim around the entire circumference of the hat) are also recommended for sun protection.  ?- Call for new or changing lesions. ? ?Skin cancer screening performed today. ?Return in about 1 year (around 06/27/2022) for TBSE. ?I, Ruthell Rummage, CMA, am acting as scribe for Brendolyn Patty, MD. ? ?Documentation: I have reviewed the  above documentation for accuracy and completeness, and I agree with the above. ? ?Brendolyn Patty MD  ? ?

## 2021-07-05 ENCOUNTER — Ambulatory Visit: Payer: Self-pay

## 2021-07-05 ENCOUNTER — Encounter: Payer: Self-pay | Admitting: Emergency Medicine

## 2021-07-05 ENCOUNTER — Other Ambulatory Visit: Payer: Self-pay

## 2021-07-05 ENCOUNTER — Ambulatory Visit
Admission: EM | Admit: 2021-07-05 | Discharge: 2021-07-05 | Disposition: A | Discharge status: Home / Self Care | Payer: Medicare Other | Attending: Nurse Practitioner | Admitting: Nurse Practitioner

## 2021-07-05 DIAGNOSIS — R42 Dizziness and giddiness: Secondary | ICD-10-CM | POA: Diagnosis not present

## 2021-07-05 MED ORDER — MECLIZINE HCL 25 MG PO TABS: 25.0000 mg | ORAL_TABLET | Freq: Three times a day (TID) | ORAL | 0 refills | Status: DC | PRN

## 2021-07-05 NOTE — Discharge Instructions (Signed)
Take medication as prescribed. ?When you have symptoms, perform the Epley maneuver, distractions are provided, this should help relieve your symptoms. ?Try to avoid any sudden movement to decrease the risk of fall. ?Follow-up in the ER if you develop altered mental status, slurred speech, inability to walk, or other concerns. ?If symptoms do not improve with medication, follow-up with ENT as discussed. ?

## 2021-07-05 NOTE — ED Triage Notes (Addendum)
Pt reports woke up with dizziness yesterday and reports with position changes dizziness re-occurs. Pt also states generalized body aches,headache since last night. Denies extremity numbness/tingling. Speech clear.  ? ? ?Pt reports has had lower oxygen readings since covid x1 year ago. Pt denies any shortness of breath. Room air o2 saturation 88-92%.   ?

## 2021-07-05 NOTE — ED Provider Notes (Signed)
[facility]    CSN: [id number] Arrival date & time: 07/05/21  1353      History   Chief Complaint Chief Complaint  Patient presents with   Dizziness    Entered by patient    HPI Kimberly Andrade is a 76 y.o. female.   He alsoPatient is a 76 year old female who presents with dizziness.  Symptoms started 2 days ago.  Patient states she woke up with the symptoms.  She states she felt like she was spinning when she got up.  She states that she has had intermittent episodes since that time.  She states symptoms worsen with sudden movement and positional changes.  Planes of headache and nausea.  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  She denies any recent upper respiratory infection, or nasal congestion.  Patient has not taken any medication for her symptoms.  Patient states she has seen ENT in the past  The history is provided by the patient.  Dizziness Quality:  Room spinning Severity:  Moderate Context: bending over, head movement and standing up   Context: not with ear pain   Relieved by:  None tried Associated symptoms: headaches and nausea   Associated symptoms: no chest pain, no shortness of breath, no vision changes, no vomiting and no weakness    Past Medical History:  Diagnosis Date   Arthritis    B12 deficiency    Crohn disease (HCC)    Crohn's disease (HCC)    Diverticulosis    DJD (degenerative joint disease)    DJD (degenerative joint disease)    Essential hypertension 08/16/2019   Full dentures    GERD (gastroesophageal reflux disease)    pt deneis    Heart palpitations    Hypercholesterolemia    Hyperlipidemia    Incontinence of urine    Nocturnal leg cramps    Osteopenia    Osteoporosis    Reflux    Sleep apnea    uses a cpap   Wears glasses     Patient Active Problem List   Diagnosis Date Noted   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   Lymphedema 03/30/2021   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   Essential hypertension  08/16/2019   Morton's neuroma 07/27/2019   Atherosclerosis of abdominal aorta (HCC) 03/16/2018   DDD (degenerative disc disease), 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/2017   Prediabetes 12/25/2016   Nocturnal leg cramps 12/25/2016   Postmenopausal osteoporosis 10/16/2016   Urinary incontinence in female 10/16/2016   Mixed hyperlipidemia 10/05/2015   GERD (gastroesophageal reflux disease) 04/24/2015   Crohn's disease of small intestine without complication (HCC) 04/24/2015   Obstructive sleep apnea of adult 11/18/2013    Past Surgical History:  Procedure Laterality Date   ABDOMINAL HYSTERECTOMY     APPENDECTOMY     CARPEL TUNNEL     CHOLECYSTECTOMY     COLONOSCOPY WITH PROPOFOL N/A 03/11/2016   Procedure: COLONOSCOPY WITH PROPOFOL;  Surgeon: Christena Deem, MD;  Location: [facility] ENDOSCOPY;  Service: Endoscopy;  Laterality: N/A;   DILATION AND CURETTAGE OF UTERUS     ESOPHAGOGASTRODUODENOSCOPY N/A 03/11/2016   Procedure: ESOPHAGOGASTRODUODENOSCOPY (EGD);  Surgeon: Christena Deem, MD;  Location: [facility] ENDOSCOPY;  Service: Endoscopy;  Laterality: N/A;   ESOPHAGOGASTRODUODENOSCOPY (EGD) WITH PROPOFOL N/A 11/11/2017   Procedure: ESOPHAGOGASTRODUODENOSCOPY (EGD) WITH PROPOFOL;  Surgeon: Christena Deem, MD;  Location: [place] ENDOSCOPY;  Service: Endoscopy;  Laterality: N/A;   ESOPHAGOGASTRODUODENOSCOPY (EGD) WITH PROPOFOL N/A 01/20/2018   Procedure: ESOPHAGOGASTRODUODENOSCOPY (EGD) WITH PROPOFOL;  Surgeon: Christena Deem, MD;  Location: [facility] ENDOSCOPY;  Service: Endoscopy;  Laterality: N/A;   FOOT SURGERY Right    HAMMER TOE SURGERY     JOINT REPLACEMENT Right 2006   shoulder   KNEE ARTHROSCOPY Right 05/25/2013   Procedure: ARTHROSCOPY KNEE;  Surgeon: Velna Ochs, MD;  Location: [facility];  Service: Orthopedics;  Laterality: Right;  medial and lateral meniscal debridment and chondroplasty   righti shoulder replacement      ROBOTIC ASSISTED LAPAROSCOPIC REPAIR OF PARAESOPHAGEAL HERNIA   07/16/2018   SHOULDER ARTHROSCOPY WITH ROTATOR CUFF REPAIR AND SUBACROMIAL DECOMPRESSION Left 08/14/2017   Procedure: SHOULDER ARTHROSCOPY WITH ROTATOR CUFF REPAIR AND SUBACROMIAL DECOMPRESSION;  Surgeon: Jones Broom, MD;  Location: [facility] OR;  Service: Orthopedics;  Laterality: Left;   SHOULDER SURGERY Right    X 3   TOTAL HIP ARTHROPLASTY Left 11/25/2017   Procedure: LEFT TOTAL HIP ARTHROPLASTY ANTERIOR APPROACH;  Surgeon: Marcene Corning, MD;  Location: [facility] OR;  Service: Orthopedics;  Laterality: Left;   TOTAL KNEE ARTHROPLASTY Right 04/18/2020   Procedure: RIGHT TOTAL KNEE ARTHROPLASTY;  Surgeon: Marcene Corning, MD;  Location: [facility]S;  Service: Orthopedics;  Laterality: Right;    OB History   No obstetric history on file.      Home Medications    Prior to Admission medications   Medication Sig Start Date End Date Taking? Authorizing Provider  meclizine (ANTIVERT) 25 MG tablet Take 1 tablet (25 mg total) by mouth 3 (three) times daily as needed for dizziness. 07/05/21  Yes Kimberly Andrade, Kimberly Haber, NP  albuterol (VENTOLIN HFA) 108 (90 Base) MCG/ACT inhaler SMARTSIG:2 Puff(s) By Mouth Every 6 Hours PRN 11/08/20   [provider]  Ascorbic Acid (VITAMIN C PO) Take 500 mg by mouth daily.    [provider]  aspirin EC 81 MG tablet Take 1 tablet (81 mg total) by mouth 2 (two) times daily after a meal. 04/18/20   Elodia Florence, PA-C  Calcium Carbonate-Vit D-Min (CALCIUM 600+D3 PLUS MINERALS) 600-800 MG-UNIT TABS Take 1 tablet by mouth daily.    [provider]  cholecalciferol (VITAMIN D) 25 MCG (1000 UNIT) tablet Take 1,000 Units by mouth daily.    [provider]  gabapentin (NEURONTIN) 300 MG capsule Take 2 capsules (600 mg total) by mouth 2 (two) times daily. Patient taking differently: Take 300 mg by mouth daily. 11/14/20   Reubin Milan, MD  isosorbide mononitrate (IMDUR) 30 MG 24 hr tablet Take 30 mg by mouth daily. 05/03/21   [provider]   losartan (COZAAR) 50 MG tablet Take 1 tablet (50 mg total) by mouth daily. 02/19/21   Reubin Milan, MD  Magnesium 250 MG TABS Take 250 mg by mouth daily.     [provider]  mesalamine (PENTASA) 500 MG CR capsule Take 2,000 mg by mouth 2 (two) times daily as needed (Crohn's).    [provider]  Multiple Vitamins-Minerals (MULTIVITAMIN WITH MINERALS) tablet Take 1 tablet by mouth daily.    [provider]  NON FORMULARY BiPap nightly    [provider]  oxybutynin (DITROPAN XL) 15 MG 24 hr tablet TAKE ONE TABLET BY MOUTH EVERY DAY AT BEDTIME 05/30/21   Reubin Milan, MD  oxybutynin (DITROPAN XL) 15 MG 24 hr tablet Take 15 mg by mouth at bedtime.    [provider]  pantoprazole (PROTONIX) 40 MG tablet Take 40 mg by mouth every morning. 06/04/21   [provider]  Potassium 99 MG TABS Take 99 mg by mouth daily.     [provider]  pravastatin (PRAVACHOL) 40 MG tablet Take 1 tablet (40 mg total) by mouth daily. 02/19/21   Reubin Milan, MD  PROLIA 60 MG/ML SOSY injection Inject 60 mg into the skin every 6 (six) months. 03/19/19   [provider]  vitamin B-12 (CYANOCOBALAMIN) 1000 MCG tablet Take 1,000 mcg by mouth daily.    [provider]  zinc gluconate 50 MG tablet Take 50 mg by mouth daily.    [provider]    Family History Family History  Problem Relation Age of Onset   Heart disease Mother    Esophageal cancer Father     Social History Social History   Tobacco Use   Smoking status: Former    Types: Cigarettes    Quit date: 12/29/2001    Years since quitting: 19.5   Smokeless tobacco: Never  Vaping Use   Vaping Use: Never used  Substance Use Topics   Alcohol use: No   Drug use: No     Allergies   Fosamax [alendronate sodium], Lipitor [atorvastatin], Codeine, and Hydrocodone   Review of Systems Review of Systems  Constitutional: Negative.   HENT: Negative.     Eyes: Negative.   Respiratory:  Negative for shortness of breath.   Cardiovascular:  Negative for chest pain.  Gastrointestinal:  Positive for nausea. Negative for vomiting.  Skin: Negative.   Neurological:  Positive for dizziness and headaches. Negative for weakness.  Psychiatric/Behavioral: Negative.      Physical Exam Triage Vital Signs ED Triage Vitals [07/05/21 1409]  Enc Vitals Group     BP 129/75     Pulse Rate 72     Resp 18     Temp 98.1 F (36.7 C)     Temp Source Oral     SpO2 91 %     Weight 179 lb (81.2 kg)     Height 5\' 1"  (1.549 m)     Head Circumference      Peak Flow      Pain Score 4     Pain Loc      Pain Edu?      Excl. in GC?    No data found.  Updated Vital Signs BP 129/75 (BP Location: Right Arm)   Pulse 72   Temp 98.1 F (36.7 C) (Oral)   Resp 18   Ht 5\' 1"  (1.549 m)   Wt 179 lb (81.2 kg)   SpO2 91%   BMI 33.82 kg/m   Visual Acuity Right Eye Distance:   Left Eye Distance:   Bilateral Distance:    Right Eye Near:   Left Eye Near:    Bilateral Near:     Physical Exam Vitals reviewed.  Constitutional:      Appearance: Normal appearance.  HENT:     Head: Normocephalic and atraumatic.  Cardiovascular:     Rate and Rhythm: Normal rate and regular rhythm.     Pulses: Normal pulses.     Heart sounds: Normal heart sounds.  Pulmonary:     Effort: Pulmonary effort is normal.     Breath sounds: Normal breath sounds.  Abdominal:     General: Bowel sounds are normal.     Palpations: Abdomen is soft.  Musculoskeletal:     Cervical back: Normal range of motion.  Neurological:     General: No focal deficit present.     Mental Status: She  is alert and oriented to person, place, and time.     GCS: GCS eye subscore is 4. GCS verbal subscore is 5. GCS motor subscore is 6.     Cranial Nerves: Cranial nerves 2-12 are intact.     Sensory: Sensation is intact.     Motor: Motor function is intact.     Coordination: Coordination is intact.      UC Treatments / Results  Labs (all labs ordered are listed, but only abnormal results are displayed) Labs Reviewed - No data to display  EKG   Radiology No results found.  Procedures Procedures (including critical care time)  Medications Ordered in UC Medications - No data to display  Initial Impression / Assessment and Plan / UC Course  I have reviewed the triage vital signs and the nursing notes.  Pertinent labs & imaging results that were available during my care of the patient were reviewed by me and considered in my medical decision making (see chart for details).  The patient is a 75 year old female who presents with dizziness.  Symptoms have been present for the past 2 days.  Patient reports a previous history of vertigo.  She has also seen ENT for her symptoms.  Patient reports her symptoms worsen with positional changes and sudden movement.  Her neurological exam is normal today.  She does have a middle ear effusion on the left side.  She also complains of headache and mild nausea.  Based on her presentation and physical exam, her symptoms are consistent with vertigo.  No concern for TIA or CVA at this time as patient does not have slurred speech, altered mental status, difficulty ambulating, or an abnormal neurological exam.  We will start the patient on meclizine for her symptoms.  Patient advised to take medication when she is able to rest as medication will make her drowsy.  Also provided the patient with information for the Epley maneuver to help relieve her symptoms if needed.  She advised to follow-up with ENT if her symptoms worsen or do not improve. Final Clinical Impressions(s) / UC Diagnoses   Final diagnoses:  Vertigo     Discharge Instructions      Take medication as prescribed. When you have symptoms, perform the Epley maneuver, distractions are provided, this should help relieve your symptoms. Try to avoid any sudden movement to decrease the risk of  fall. Follow-up in the ER if you develop altered mental status, slurred speech, inability to walk, or other concerns. If symptoms do not improve with medication, follow-up with ENT as discussed.     ED Prescriptions     Medication Sig Dispense Auth. Provider   meclizine (ANTIVERT) 25 MG tablet Take 1 tablet (25 mg total) by mouth 3 (three) times daily as needed for dizziness. 30 tablet Kimberly Andrade, Kimberly Haber, NP      PDMP not reviewed this encounter.   Abran Cantor, NP 07/05/21 1448

## 2021-07-05 NOTE — Telephone Encounter (Signed)
?  Chief Complaint: Ear pain pressure/pain - dizziness ?Symptoms: Dizziness ?Frequency: Since Saturday ?Pertinent Negatives: Patient denies fever ?Disposition: [] ED /[x] Urgent Care (no appt availability in office) / [] Appointment(In office/virtual)/ []  [place] [facility]/ [] Home Care/ [] Refused Recommended Disposition /[]  Mobile Bus/ []  Follow-up with PCP ?Additional Notes: Pt had ear pain on Saturday which has mostly resolved - pt now has ear pressure. PT is quite dizzy however. Unsure of cause. ? ?Reason for Disposition ? Walking is very unsteady or feels very dizzy ? ?Answer Assessment - Initial Assessment Questions ?1. LOCATION: "Which ear is involved?" ?    Right ear ?2. ONSET: "When did the ear start hurting"  ?    Saturday ?3. SEVERITY: "How bad is the pain?"  (Scale 1-10; mild, moderate or severe) ?  - MILD (1-3): doesn't interfere with normal activities  ?  - MODERATE (4-7): interferes with normal activities or awakens from sleep  ?  - SEVERE (8-10): excruciating pain, unable to do any normal activities  ?    pressure ?4. URI SYMPTOMS: "Do you have a runny nose or cough?" ?    Cough - 1 year ?5. FEVER: "Do you have a fever?" If Yes, ask: "What is your temperature, how was it measured, and when did it start?" ?    no ?6. CAUSE: "Have you been swimming recently?", "How often do you use Q-TIPS?", "Have you had any recent air travel or scuba diving?" ?    no ?7. OTHER SYMPTOMS: "Do you have any other symptoms?" (e.g., headache, stiff neck, dizziness, vomiting, runny nose, decreased hearing) ?    dizziness ?8. PREGNANCY: "Is there any chance you are pregnant?" "When was your last menstrual period?" ?    na ? ?Protocols used: Earache-A-AH ? ?

## 2021-07-05 NOTE — Telephone Encounter (Signed)
Called patient to schedule her for tomorrow but she is already on the way to urgent care.  ?

## 2021-07-09 ENCOUNTER — Ambulatory Visit: Payer: Self-pay | Admitting: *Deleted

## 2021-07-09 ENCOUNTER — Telehealth: Payer: Self-pay

## 2021-07-09 ENCOUNTER — Ambulatory Visit: Payer: Medicare Other | Admitting: Internal Medicine

## 2021-07-09 NOTE — Telephone Encounter (Signed)
?  Chief Complaint: Vertigo ?Symptoms: Spinning "Dealt with vertigo for years" Stiff neck, headache "From holding my head so still." ?Frequency: 07/05/21  went to UC ?Pertinent Negatives: Patient denies numbness, weakness ?Disposition: [] ED /[] Urgent Care (no appt availability in office) / [x] Appointment(In office/virtual)/ []  [place] [facility]/ [] Home Care/ [] Refused Recommended Disposition /[]  Mobile Bus/ []  Follow-up with PCP ?Additional Notes: Given Meclizine at UC, "Helps a little."  ?Reason for Disposition ? [1] MODERATE dizziness (e.g., vertigo; feels very unsteady, interferes with normal activities) AND [2] has NOT been evaluated by physician for this ? ?Answer Assessment - Initial Assessment Questions ?1. DESCRIPTION: "Describe your dizziness." ?    Spinning ?2. VERTIGO: "Do you feel like either you or the room is spinning or tilting?"  ?    Spinning "Not as bad as it was." ?3. LIGHTHEADED: "Do you feel lightheaded?" (e.g., somewhat faint, woozy, weak upon standing) ?    Yes ?4. SEVERITY: "How bad is it?"  "Can you walk?" ?  - MILD: Feels slightly dizzy and unsteady, but is walking normally. ?  - MODERATE: Feels unsteady when walking, but not falling; interferes with normal activities (e.g., school, work). ?  - SEVERE: Unable to walk without falling, or requires assistance to walk without falling. ?    If making sudden movements. ?5. ONSET:  "When did the dizziness begin?" ?    07/05/21 ?6. AGGRAVATING FACTORS: "Does anything make it worse?" (e.g., standing, change in head position) ?    Turning head ?7. CAUSE: "What do you think is causing the dizziness?" ?    Vertigo ?8. RECURRENT SYMPTOM: "Have you had dizziness before?" If Yes, ask: "When was the last time?" "What happened that time?" ?    Yes "For years" ?9. OTHER SYMPTOMS: "Do you have any other symptoms?" (e.g., headache, weakness, numbness, vomiting, earache) ?    Headache "From spine to top of head"  "Holding my head so still and my  neck is stiff."  Med helps somewhat. ? ?Protocols used: Dizziness - Vertigo-A-AH ? ?

## 2021-07-09 NOTE — Telephone Encounter (Signed)
Noted  ? ?Pt has an appt today 07/09/21. ? ?KP ?

## 2021-07-09 NOTE — Telephone Encounter (Signed)
Patient was on our schedule today to be seen for Vertigo. Dr Army Melia told me to call the patient and let her know that we see she had a full work up and was told to take meclizine and see her ENT doctor. Told her per Dr Army Melia she said she cannot do much else for vertigo so she will need to follow this with ENT.  ? ?Patient said she cannot get in with Dr Kathyrn Sheriff until June. ? ?Patient said she is already in the parking lot here for her visit. Told her we could still see her and she can come on up and check in. She said if there is nothing she can do, then she does not want to be seen. I was in the middle of apologizing to her about there being nothing Dr Army Melia can do, and the patient hung up abruptly.  ? ?

## 2021-08-02 ENCOUNTER — Other Ambulatory Visit: Payer: Self-pay | Admitting: Internal Medicine

## 2021-08-02 DIAGNOSIS — R32 Unspecified urinary incontinence: Secondary | ICD-10-CM

## 2021-08-03 MED ORDER — OXYBUTYNIN CHLORIDE ER 15 MG PO TB24: 15.0000 mg | ORAL_TABLET | Freq: Every day | ORAL | 0 refills | Status: DC

## 2021-08-20 ENCOUNTER — Other Ambulatory Visit: Payer: Self-pay | Admitting: Internal Medicine

## 2021-08-20 DIAGNOSIS — R32 Unspecified urinary incontinence: Secondary | ICD-10-CM

## 2021-08-20 MED ORDER — OXYBUTYNIN CHLORIDE ER 15 MG PO TB24: 15.0000 mg | ORAL_TABLET | Freq: Every day | ORAL | 1 refills | Status: DC

## 2021-09-06 ENCOUNTER — Encounter: Payer: Self-pay | Admitting: Internal Medicine

## 2021-09-06 ENCOUNTER — Ambulatory Visit (INDEPENDENT_AMBULATORY_CARE_PROVIDER_SITE_OTHER): Payer: Medicare Other | Admitting: Internal Medicine

## 2021-09-06 VITALS — BP 128/78 | HR 69 | Ht 61.0 in | Wt 180.0 lb

## 2021-09-06 DIAGNOSIS — I10 Essential (primary) hypertension: Secondary | ICD-10-CM | POA: Diagnosis not present

## 2021-09-06 DIAGNOSIS — Z1231 Encounter for screening mammogram for malignant neoplasm of breast: Secondary | ICD-10-CM

## 2021-09-06 DIAGNOSIS — K5000 Crohn's disease of small intestine without complications: Secondary | ICD-10-CM | POA: Diagnosis not present

## 2021-09-06 DIAGNOSIS — I700 Atherosclerosis of aorta: Secondary | ICD-10-CM

## 2021-09-06 DIAGNOSIS — J849 Interstitial pulmonary disease, unspecified: Secondary | ICD-10-CM | POA: Diagnosis not present

## 2021-09-06 DIAGNOSIS — R32 Unspecified urinary incontinence: Secondary | ICD-10-CM

## 2021-09-06 MED ORDER — OXYBUTYNIN CHLORIDE ER 15 MG PO TB24: 15.0000 mg | ORAL_TABLET | Freq: Every day | ORAL | 1 refills | Status: DC

## 2021-09-06 NOTE — Progress Notes (Signed)
Date:  09/06/2021   Name:  Kimberly Andrade   DOB:  1945-06-06   MRN:  [id number]   Chief Complaint: Hypertension  Hypertension This is a chronic problem. The current episode started more than 1 year ago. The problem has been waxing and waning since onset. The problem is controlled. Associated symptoms include shortness of breath. Pertinent negatives include no chest pain, headaches or palpitations. Past treatments include angiotensin blockers. There is no history of kidney disease, CAD/MI or CVA.  Urinary Frequency  This is a recurrent problem. The patient is experiencing no pain. There has been no fever. Associated symptoms include frequency. Pertinent negatives include no chills. Treatments tried: ditropan.  Shortness of Breath This is a chronic problem. The problem occurs intermittently. Progression since onset: since Covid-19 infectioni. Associated symptoms include leg swelling. Pertinent negatives include no abdominal pain, chest pain, fever, headaches or wheezing.  CAD - seen in ED with chest pain.  Followed up with Cardiology and had ECHO showing mild ischemia.  She was started on ISDN with resolution of chest pain and improved BP.   Lab Results  Component Value Date   NA 140 02/19/2021   K 4.5 02/19/2021   CO2 25 02/19/2021   GLUCOSE 93 02/19/2021   BUN 13 02/19/2021   CREATININE 0.93 02/19/2021   CALCIUM 9.8 02/19/2021   EGFR 64 02/19/2021   GFRNONAA >60 04/24/2020   Lab Results  Component Value Date   CHOL 148 02/19/2021   HDL 48 02/19/2021   LDLCALC 71 02/19/2021   TRIG 172 (H) 02/19/2021   CHOLHDL 3.1 02/19/2021   Lab Results  Component Value Date   TSH 2.750 02/19/2021   Lab Results  Component Value Date   HGBA1C 5.7 (H) 02/19/2021   Lab Results  Component Value Date   WBC 6.2 02/19/2021   HGB 13.3 02/19/2021   HCT 41.0 02/19/2021   MCV 87 02/19/2021   PLT 245 02/19/2021   Lab Results  Component Value Date   ALT 14 02/19/2021   AST 17  02/19/2021   ALKPHOS 44 02/19/2021   BILITOT 0.5 02/19/2021   Lab Results  Component Value Date   VD25OH 35.7 07/27/2019     Review of Systems  Constitutional:  Negative for chills, fatigue and fever.  HENT:  Negative for trouble swallowing.   Respiratory:  Positive for shortness of breath. Negative for choking, chest tightness and wheezing.   Cardiovascular:  Positive for leg swelling. Negative for chest pain and palpitations.  Gastrointestinal:  Negative for abdominal pain, constipation and diarrhea.  Genitourinary:  Positive for frequency.  Musculoskeletal:  Positive for arthralgias.  Neurological:  Negative for dizziness, light-headedness and headaches.  Psychiatric/Behavioral:  Negative for dysphoric mood and sleep disturbance. The patient is not nervous/anxious.     Patient Active Problem List   Diagnosis Date Noted   Interstitial pulmonary disease ([place]) 09/06/2021   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   Lymphedema 03/30/2021   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   Essential hypertension 08/16/2019   Morton's neuroma 07/27/2019   Atherosclerosis of abdominal aorta ([place]) 03/16/2018   DDD (degenerative disc disease), 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/2017   Prediabetes 12/25/2016   Nocturnal leg cramps 12/25/2016   Postmenopausal osteoporosis 10/16/2016   Urinary incontinence in female 10/16/2016   Mixed hyperlipidemia 10/05/2015   GERD (gastroesophageal reflux disease) 04/24/2015   Crohn's disease of small intestine without complication ([place]) 27/78/2423   Obstructive sleep apnea of adult 11/18/2013    Allergies  Allergen Reactions  Fosamax [Alendronate Sodium] Other (See Comments)    Bones hurt   Lipitor [Atorvastatin] Other (See Comments)    Bone pain   Codeine Other (See Comments)     Codeine in the liquid form causes gastritis   Hydrocodone Nausea And Vomiting    Past Surgical History:  Procedure Laterality Date   ABDOMINAL HYSTERECTOMY     APPENDECTOMY     CARPEL TUNNEL      CHOLECYSTECTOMY     COLONOSCOPY WITH PROPOFOL N/A 03/11/2016   Procedure: COLONOSCOPY WITH PROPOFOL;  Surgeon: Lollie Sails, MD;  [facility] ENDOSCOPY;  Service: Endoscopy;  Laterality: N/A;   DILATION AND CURETTAGE OF UTERUS     ESOPHAGOGASTRODUODENOSCOPY N/A 03/11/2016   Procedure: ESOPHAGOGASTRODUODENOSCOPY (EGD);  Surgeon: Lollie Sails, MD;  Location: [facility] ENDOSCOPY;  Service: Endoscopy;  Laterality: N/A;   ESOPHAGOGASTRODUODENOSCOPY (EGD) WITH PROPOFOL N/A 11/11/2017   Procedure: ESOPHAGOGASTRODUODENOSCOPY (EGD) WITH PROPOFOL;  Surgeon: Lollie Sails, MD;  Location: [facility] ENDOSCOPY;  Service: Endoscopy;  Laterality: N/A;   ESOPHAGOGASTRODUODENOSCOPY (EGD) WITH PROPOFOL N/A 01/20/2018   Procedure: ESOPHAGOGASTRODUODENOSCOPY (EGD) WITH PROPOFOL;  Surgeon: Lollie Sails, MD;  Location: [facility] ENDOSCOPY;  Service: Endoscopy;  Laterality: N/A;   FOOT SURGERY Right    HAMMER TOE SURGERY     JOINT REPLACEMENT Right 2006   shoulder   KNEE ARTHROSCOPY Right 05/25/2013   Procedure: ARTHROSCOPY KNEE;  Surgeon: Hessie Dibble, MD;  Location: [place];  Service: Orthopedics;  Laterality: Right;  medial and lateral meniscal debridment and chondroplasty   righti shoulder replacement      ROBOTIC ASSISTED LAPAROSCOPIC REPAIR OF PARAESOPHAGEAL HERNIA  07/16/2018   SHOULDER ARTHROSCOPY WITH ROTATOR CUFF REPAIR AND SUBACROMIAL DECOMPRESSION Left 08/14/2017   Procedure: SHOULDER ARTHROSCOPY WITH ROTATOR CUFF REPAIR AND SUBACROMIAL DECOMPRESSION;  Surgeon: Tania Ade, MD;  Location: [place];  Service: Orthopedics;  Laterality: Left;   SHOULDER SURGERY Right    X 3   TOTAL HIP ARTHROPLASTY Left 11/25/2017   Procedure: LEFT TOTAL HIP ARTHROPLASTY ANTERIOR APPROACH;  Surgeon: Melrose Nakayama, MD;  Location: [place];  Service: Orthopedics;  Laterality: Left;   TOTAL KNEE ARTHROPLASTY Right 04/18/2020   Procedure: RIGHT TOTAL KNEE ARTHROPLASTY;  Surgeon: Melrose Nakayama, MD;   Location: [facility];  Service: Orthopedics;  Laterality: Right;    Social History   Tobacco Use   Smoking status: Former    Types: Cigarettes    Quit date: 12/29/2001    Years since quitting: 19.7   Smokeless tobacco: Never  Vaping Use   Vaping Use: Never used  Substance Use Topics   Alcohol use: No   Drug use: No     Medication list has been reviewed and updated.  Current Meds  Medication Sig   albuterol (VENTOLIN HFA) 108 (90 Base) MCG/ACT inhaler SMARTSIG:2 Puff(s) By Mouth Every 6 Hours PRN   Ascorbic Acid (VITAMIN C PO) Take 500 mg by mouth daily.   aspirin EC 81 MG tablet Take 1 tablet (81 mg total) by mouth 2 (two) times daily after a meal.   Calcium Carbonate-Vit D-Min (CALCIUM 600+D3 PLUS MINERALS) 600-800 MG-UNIT TABS Take 1 tablet by mouth daily.   cholecalciferol (VITAMIN D) 25 MCG (1000 UNIT) tablet Take 1,000 Units by mouth daily.   gabapentin (NEURONTIN) 300 MG capsule Take 2 capsules (600 mg total) by mouth 2 (two) times daily. (Patient taking differently: Take 300 mg by mouth daily.)   isosorbide mononitrate (IMDUR) 30 MG 24 hr tablet Take 30 mg by  mouth daily.   losartan (COZAAR) 50 MG tablet Take 1 tablet (50 mg total) by mouth daily.   Magnesium 250 MG TABS Take 250 mg by mouth daily.    meclizine (ANTIVERT) 25 MG tablet Take 1 tablet (25 mg total) by mouth 3 (three) times daily as needed for dizziness.   mesalamine (PENTASA) 500 MG CR capsule Take 2,000 mg by mouth 2 (two) times daily as needed (Crohn's).   Multiple Vitamins-Minerals (MULTIVITAMIN WITH MINERALS) tablet Take 1 tablet by mouth daily.   NON FORMULARY BiPap nightly   pantoprazole (PROTONIX) 40 MG tablet Take 40 mg by mouth every morning.   Potassium 99 MG TABS Take 99 mg by mouth daily.    pravastatin (PRAVACHOL) 40 MG tablet Take 1 tablet (40 mg total) by mouth daily.   PROLIA 60 MG/ML SOSY injection Inject 60 mg into the skin every 6 (six) months.   vitamin B-12 (CYANOCOBALAMIN) 1000 MCG tablet  Take 1,000 mcg by mouth daily.   zinc gluconate 50 MG tablet Take 50 mg by mouth daily.   [DISCONTINUED] oxybutynin (DITROPAN XL) 15 MG 24 hr tablet Take 1 tablet (15 mg total) by mouth at bedtime.       09/06/2021    9:45 AM 03/20/2021   11:08 AM 03/09/2021    9:27 AM 02/19/2021    9:19 AM  GAD 7 : Generalized Anxiety Score  Nervous, Anxious, on Edge 0 0 0 0  Control/stop worrying 0 0 0 0  Worry too much - different things 0 0 0 0  Trouble relaxing 0 0 0 0  Restless 0 0 0 0  Easily annoyed or irritable 1 0 0 0  Afraid - awful might happen 0 0 0 0  Total GAD 7 Score 1 0 0 0  Anxiety Difficulty Not difficult at all Not difficult at all Not difficult at all Not difficult at all       09/06/2021    9:45 AM 04/04/2021   10:06 AM 03/20/2021   11:08 AM  Depression screen PHQ 2/9  Decreased Interest 0 0 0  Down, Depressed, Hopeless 1 0 0  PHQ - 2 Score 1 0 0  Altered sleeping 0 0 0  Tired, decreased energy _0 Change in appetite 0 0 0  Feeling bad or failure about yourself  0 0 0  Trouble concentrating 2 0 0  Moving slowly or fidgety/restless 0 0 0  Suicidal thoughts 0 0 0  PHQ-9 Score _1 Difficult doing work/chores Not difficult at all Not difficult at all Very difficult    BP Readings from Last 3 Encounters:  09/06/21 128/78  07/05/21 129/75  04/19/21 (!) 139/111    Physical Exam Vitals and nursing note reviewed.  Constitutional:      General: She is not in acute distress.    Appearance: Normal appearance. She is well-developed.  HENT:     Head: Normocephalic and atraumatic.  Cardiovascular:     Rate and Rhythm: Normal rate and regular rhythm.     Heart sounds: No murmur heard. Pulmonary:     Effort: Pulmonary effort is normal. No respiratory distress.     Breath sounds: No wheezing or rhonchi.  Musculoskeletal:     Cervical back: Normal range of motion.     Right lower leg: No edema.     Left lower leg: Edema (trace edema) present.  Lymphadenopathy:      Cervical: No cervical adenopathy.  Skin:  General: Skin is warm and dry.     Capillary Refill: Capillary refill takes less than 2 seconds.     Findings: No rash.  Neurological:     General: No focal deficit present.     Mental Status: She is alert and oriented to person, place, and time.  Psychiatric:        Mood and Affect: Mood normal.        Behavior: Behavior normal.     Wt Readings from Last 3 Encounters:  09/06/21 180 lb (81.6 kg)  07/05/21 179 lb (81.2 kg)  04/19/21 180 lb (81.6 kg)    BP 128/78 (BP Location: Left Arm, Cuff Size: Normal)   Pulse 69   Ht _0  (1.549 m)   Wt 180 lb (81.6 kg)   SpO2 96%   BMI 34.01 kg/m   Assessment and Plan: 1. Essential hypertension Clinically stable exam with well controlled BP. Tolerating medications without side effects at this time. Pt to continue current regimen and low sodium diet; benefits of regular exercise as able discussed.  2. Urinary incontinence in female Symptoms well controlled; no s/s of UTI - oxybutynin (DITROPAN XL) 15 MG 24 hr tablet; Take 1 tablet (15 mg total) by mouth at bedtime.  Dispense: 90 tablet; Refill: 1  3. Interstitial pulmonary disease ([place]) Being followed by Dr. Raul Del Recommend [id number] CT scheduled  4. Crohn's disease of small intestine without complication ([place]) Followed by GI - recently started on Protonix for gastritis  5. Atherosclerosis of abdominal aorta ([facility]) Noted on CT scan Continue Statin and ASA  6. Encounter for screening mammogram for breast cancer Due soon. - MM 3D SCREEN BREAST BILATERAL   Partially dictated using Editor, commissioning. Any errors are unintentional.  Halina Maidens, MD [facility]  09/06/2021

## 2021-09-26 ENCOUNTER — Encounter: Payer: Self-pay | Admitting: Internal Medicine

## 2021-09-26 ENCOUNTER — Other Ambulatory Visit: Payer: Self-pay

## 2021-09-26 DIAGNOSIS — E782 Mixed hyperlipidemia: Secondary | ICD-10-CM

## 2021-09-26 MED ORDER — PRAVASTATIN SODIUM 40 MG PO TABS: 40.0000 mg | ORAL_TABLET | Freq: Every day | ORAL | 1 refills | Status: DC

## 2021-09-27 ENCOUNTER — Ambulatory Visit
Admission: RE | Admit: 2021-09-27 | Discharge: 2021-09-27 | Disposition: A | Discharge status: Home / Self Care | Payer: Medicare Other | Source: Ambulatory Visit | Attending: Internal Medicine | Admitting: Internal Medicine

## 2021-09-27 DIAGNOSIS — Z1231 Encounter for screening mammogram for malignant neoplasm of breast: Secondary | ICD-10-CM | POA: Diagnosis present

## 2021-09-30 NOTE — Progress Notes (Unsigned)
MRN : [id number]  Kimberly Andrade is a 76 y.o. (Aug 17, 1945) female who presents with chief complaint of legs swell.  History of Present Illness:   The patient returns to the office for followup evaluation regarding leg swelling.  The swelling has improved quite a bit and the pain associated with swelling has decreased substantially. There have not been any interval development of a ulcerations or wounds.  Since the previous visit the patient has been wearing graduated compression stockings and has noted some improvement in the lymphedema. The patient has been using compression routinely morning until night.  The patient also states elevation during the day and exercise (such as walking) is being done too.     The patient returns to the office for followup evaluation regarding leg swelling.  The swelling has persisted but with the lymph pump is under much, much better controlled. The pain associated with swelling is decreased. There have not been any interval development of a ulcerations or wounds.  The patient denies problems with the pump, noting it is working well and the leggings are in good condition.  Since the previous visit the patient has been wearing graduated compression stockings and using the lymph pump on a routine basis and  has noted significant improvement in the lymphedema.   Patient stated the lymph pump has been helpful with the treatment of the lymphedema.    No outpatient medications have been marked as taking for the 10/01/21 encounter (Appointment) with Delana Meyer, Dolores Lory, MD.    Past Medical History:  Diagnosis Date   Arthritis    B12 deficiency    Crohn disease ([place])    Crohn's disease ([place])    Diverticulosis    DJD (degenerative joint disease)    DJD (degenerative joint disease)    Essential hypertension 08/16/2019   Full dentures    GERD (gastroesophageal reflux disease)    pt deneis    Heart palpitations    Hypercholesterolemia     Hyperlipidemia    Incontinence of urine    Nocturnal leg cramps    Osteopenia    Osteoporosis    Reflux    Sleep apnea    uses a cpap   Wears glasses     Past Surgical History:  Procedure Laterality Date   ABDOMINAL HYSTERECTOMY     APPENDECTOMY     CARPEL TUNNEL     CHOLECYSTECTOMY     COLONOSCOPY WITH PROPOFOL N/A 03/11/2016   Procedure: COLONOSCOPY WITH PROPOFOL;  Surgeon: Lollie Sails, MD;  Location: [facility] ENDOSCOPY;  Service: Endoscopy;  Laterality: N/A;   DILATION AND CURETTAGE OF UTERUS     ESOPHAGOGASTRODUODENOSCOPY N/A 03/11/2016   Procedure: ESOPHAGOGASTRODUODENOSCOPY (EGD);  Surgeon: Lollie Sails, MD;  Location: [facility] ENDOSCOPY;  Service: Endoscopy;  Laterality: N/A;   ESOPHAGOGASTRODUODENOSCOPY (EGD) WITH PROPOFOL N/A 11/11/2017   Procedure: ESOPHAGOGASTRODUODENOSCOPY (EGD) WITH PROPOFOL;  Surgeon: Lollie Sails, MD;  Location: [facility] ENDOSCOPY;  Service: Endoscopy;  Laterality: N/A;   ESOPHAGOGASTRODUODENOSCOPY (EGD) WITH PROPOFOL N/A 01/20/2018   Procedure: ESOPHAGOGASTRODUODENOSCOPY (EGD) WITH PROPOFOL;  Surgeon: Lollie Sails, MD;  Location: [facility] ENDOSCOPY;  Service: Endoscopy;  Laterality: N/A;   FOOT SURGERY Right    HAMMER TOE SURGERY     JOINT REPLACEMENT Right 2006   shoulder   KNEE ARTHROSCOPY Right 05/25/2013   Procedure: ARTHROSCOPY KNEE;  Surgeon: Hessie Dibble, MD;  Location: [place];  Service: Orthopedics;  Laterality: Right;  medial and lateral meniscal debridment and  chondroplasty   righti shoulder replacement      ROBOTIC ASSISTED LAPAROSCOPIC REPAIR OF PARAESOPHAGEAL HERNIA  07/16/2018   SHOULDER ARTHROSCOPY WITH ROTATOR CUFF REPAIR AND SUBACROMIAL DECOMPRESSION Left 08/14/2017   Procedure: SHOULDER ARTHROSCOPY WITH ROTATOR CUFF REPAIR AND SUBACROMIAL DECOMPRESSION;  Surgeon: Tania Ade, MD;  Location: [place];  Service: Orthopedics;  Laterality: Left;   SHOULDER SURGERY Right    X 3   TOTAL HIP ARTHROPLASTY Left  11/25/2017   Procedure: LEFT TOTAL HIP ARTHROPLASTY ANTERIOR APPROACH;  Surgeon: Melrose Nakayama, MD;  Location: [place];  Service: Orthopedics;  Laterality: Left;   TOTAL KNEE ARTHROPLASTY Right 04/18/2020   Procedure: RIGHT TOTAL KNEE ARTHROPLASTY;  Surgeon: Melrose Nakayama, MD;  Location: [facility];  Service: Orthopedics;  Laterality: Right;    Social History Social History   Tobacco Use   Smoking status: Former    Types: Cigarettes    Quit date: 12/29/2001    Years since quitting: 19.7   Smokeless tobacco: Never  Vaping Use   Vaping Use: Never used  Substance Use Topics   Alcohol use: No   Drug use: No    Family History Family History  Problem Relation Age of Onset   Heart disease Mother    Esophageal cancer Father     Allergies  Allergen Reactions   Fosamax [Alendronate Sodium] Other (See Comments)    Bones hurt   Lipitor [Atorvastatin] Other (See Comments)    Bone pain   Codeine Other (See Comments)     Codeine in the liquid form causes gastritis   Hydrocodone Nausea And Vomiting     REVIEW OF SYSTEMS (Negative unless checked)  Constitutional: [] Weight loss  [] Fever  [] Chills Cardiac: [] Chest pain   [] Chest pressure   [] Palpitations   [] Shortness of breath when laying flat   [] Shortness of breath with exertion. Vascular:  [] Pain in legs with walking   [x] Pain in legs with standing  [] History of DVT   [] Phlebitis   [x] Swelling in legs   [] Varicose veins   [] Non-healing ulcers Pulmonary:   [] Uses home oxygen   [] Productive cough   [] Hemoptysis   [] Wheeze  [] COPD   [] Asthma Neurologic:  [] Dizziness   [] Seizures   [] History of stroke   [] History of TIA  [] Aphasia   [] Vissual changes   [] Weakness or numbness in arm   [] Weakness or numbness in leg Musculoskeletal:   [] Joint swelling   [] Joint pain   [] Low back pain Hematologic:  [] Easy bruising  [] Easy bleeding   [] Hypercoagulable state   [] Anemic Gastrointestinal:  [] Diarrhea   [] Vomiting  [] Gastroesophageal  reflux/heartburn   [] Difficulty swallowing. Genitourinary:  [] Chronic kidney disease   [] Difficult urination  [] Frequent urination   [] Blood in urine Skin:  [] Rashes   [] Ulcers  Psychological:  [] History of anxiety   []  History of major depression.  Physical Examination  There were no vitals filed for this visit. There is no height or weight on file to calculate BMI. Gen: WD/WN, NAD Head: [place]/AT, No temporalis wasting.  Ear/Nose/Throat: Hearing grossly intact, nares w/o erythema or drainage, pinna without lesions Eyes: PER, EOMI, sclera nonicteric.  Neck: Supple, no gross masses.  No JVD.  Pulmonary:  Good air movement, no audible wheezing, no use of accessory muscles.  Cardiac: RRR, precordium not hyperdynamic. Vascular:  scattered varicosities present bilaterally.  Mild venous stasis changes to the legs bilaterally.  3-4+ soft pitting edema  Vessel Right Left  Radial Palpable Palpable  Gastrointestinal: soft, non-distended. No guarding/no peritoneal signs.  Musculoskeletal: M/S 5/5 throughout.  No deformity.  Neurologic: CN 2-12 intact. Pain and light touch intact in extremities.  Symmetrical.  Speech is fluent. Motor exam as listed above. Psychiatric: Judgment intact, Mood & affect appropriate for pt's clinical situation. Dermatologic: Venous rashes no ulcers noted.  No changes consistent with cellulitis. Lymph : No lichenification or skin changes of chronic lymphedema.  CBC Lab Results  Component Value Date   WBC 6.2 02/19/2021   HGB 13.3 02/19/2021   HCT 41.0 02/19/2021   MCV 87 02/19/2021   PLT 245 02/19/2021    BMET    Component Value Date/Time   NA 140 02/19/2021 0957   K 4.5 02/19/2021 0957   CL 102 02/19/2021 0957   CO2 25 02/19/2021 0957   GLUCOSE 93 02/19/2021 0957   GLUCOSE 100 (H) 04/24/2020 1730   BUN 13 02/19/2021 0957   CREATININE 0.93 02/19/2021 0957   CALCIUM 9.8 02/19/2021 0957   GFRNONAA >60 04/24/2020 1730   GFRAA 76 02/16/2020 1017   CrCl  cannot be calculated (Patient's most recent lab result is older than the maximum 21 days allowed.).  COAG Lab Results  Component Value Date   INR 1.1 04/13/2020   INR 1.03 11/14/2017    Radiology MM 3D SCREEN BREAST BILATERAL  Result Date: 09/28/2021 CLINICAL DATA:  Screening. EXAM: DIGITAL SCREENING BILATERAL MAMMOGRAM WITH TOMOSYNTHESIS AND CAD TECHNIQUE: Bilateral screening digital craniocaudal and mediolateral oblique mammograms were obtained. Bilateral screening digital breast tomosynthesis was performed. The images were evaluated with computer-aided detection. COMPARISON:  Previous exam(s). ACR Breast Density Category b: There are scattered areas of fibroglandular density. FINDINGS: There are no findings suspicious for malignancy. IMPRESSION: No mammographic evidence of malignancy. A result letter of this screening mammogram will be mailed directly to the patient. RECOMMENDATION: Screening mammogram in one year. (Code:SM-B-01Y) BI-RADS CATEGORY  1: Negative. Electronically Signed   By: Lajean Manes [name]   On: 09/28/2021 09:32     Assessment/Plan There are no diagnoses linked to this encounter.   Hortencia Pilar, MD  09/30/2021 2:31 PM

## 2021-10-01 ENCOUNTER — Ambulatory Visit (INDEPENDENT_AMBULATORY_CARE_PROVIDER_SITE_OTHER): Payer: Medicare Other | Admitting: Vascular Surgery

## 2021-10-01 ENCOUNTER — Encounter (INDEPENDENT_AMBULATORY_CARE_PROVIDER_SITE_OTHER): Payer: Self-pay | Admitting: Vascular Surgery

## 2021-10-01 VITALS — BP 137/75 | HR 68 | Resp 16 | Wt 179.0 lb

## 2021-10-01 DIAGNOSIS — I890 Lymphedema, not elsewhere classified: Secondary | ICD-10-CM | POA: Diagnosis not present

## 2021-10-01 DIAGNOSIS — I10 Essential (primary) hypertension: Secondary | ICD-10-CM | POA: Diagnosis not present

## 2021-10-01 DIAGNOSIS — E782 Mixed hyperlipidemia: Secondary | ICD-10-CM | POA: Diagnosis not present

## 2021-10-01 DIAGNOSIS — M5137 Other intervertebral disc degeneration, lumbosacral region: Secondary | ICD-10-CM

## 2021-10-05 ENCOUNTER — Encounter: Payer: Self-pay | Admitting: Internal Medicine

## 2021-10-05 ENCOUNTER — Other Ambulatory Visit: Payer: Self-pay

## 2021-10-05 DIAGNOSIS — I10 Essential (primary) hypertension: Secondary | ICD-10-CM

## 2021-10-05 MED ORDER — LOSARTAN POTASSIUM 50 MG PO TABS: 50.0000 mg | ORAL_TABLET | Freq: Every day | ORAL | 1 refills | Status: DC

## 2021-11-19 ENCOUNTER — Other Ambulatory Visit: Payer: Self-pay | Admitting: Pulmonary Disease

## 2021-11-19 DIAGNOSIS — J849 Interstitial pulmonary disease, unspecified: Secondary | ICD-10-CM

## 2021-11-26 ENCOUNTER — Ambulatory Visit
Admission: RE | Admit: 2021-11-26 | Discharge: 2021-11-26 | Disposition: A | Discharge status: Home / Self Care | Payer: Medicare Other | Source: Ambulatory Visit | Attending: Pulmonary Disease | Admitting: Pulmonary Disease

## 2021-11-26 DIAGNOSIS — J849 Interstitial pulmonary disease, unspecified: Secondary | ICD-10-CM

## 2021-12-31 ENCOUNTER — Encounter (INDEPENDENT_AMBULATORY_CARE_PROVIDER_SITE_OTHER): Payer: Self-pay

## 2022-01-08 ENCOUNTER — Ambulatory Visit (INDEPENDENT_AMBULATORY_CARE_PROVIDER_SITE_OTHER): Payer: Medicare Other | Admitting: Podiatry

## 2022-01-08 DIAGNOSIS — G629 Polyneuropathy, unspecified: Secondary | ICD-10-CM | POA: Diagnosis not present

## 2022-01-08 NOTE — Progress Notes (Signed)
   Chief Complaint  Patient presents with   Follow-up    Patient is here complaining of bilateral foot pain and numbness that is moving up her legs, she states that she is not I a lot of pain today, but has been having a lot of pain.    HPI: 76 y.o. female presenting today for follow-up evaluation and treatment of bilateral lower extremity peripheral polyneuropathy.  Over the past year the patient has been taking gabapentin.  She says that it does mitigate some of her pain and symptoms.  She also had really good success with the [facility] PT. she presents for further treatment and evaluation  Past Medical History:  Diagnosis Date   Arthritis    B12 deficiency    Crohn disease ([place])    Crohn's disease ([place])    Diverticulosis    DJD (degenerative joint disease)    DJD (degenerative joint disease)    Essential hypertension 08/16/2019   Full dentures    GERD (gastroesophageal reflux disease)    pt deneis    Heart palpitations    Hypercholesterolemia    Hyperlipidemia    Incontinence of urine    Nocturnal leg cramps    Osteopenia    Osteoporosis    Reflux    Sleep apnea    uses a cpap   Wears glasses      Physical Exam: General: The patient is alert and oriented x3 in no acute distress.  Dermatology: Skin is warm, dry and supple bilateral lower extremities. Negative for open lesions or macerations.  Vascular: Palpable pedal pulses bilaterally. No edema or erythema noted. Capillary refill within normal limits.  Neurological: Light touch and protective threshold diminished bilaterally.   Musculoskeletal Exam: No pedal deformities noted  Assessment: 1.  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  Lumbar pain  Plan of Care:  1. Patient evaluated.  2.  Continue gabapentin as prescribed by her PCP 3.  Patient had really good success with the [facility].  Recommend that she returns for treatment if she notices an  increase or exacerbation of her neuropathy 4.  Recommend follow-up with neuro spine to evaluate lower back pain which may be contributing to her neuropathy 5.  Return to clinic as needed      Edrick Kins, DPM [facility]  Dr. Edrick Kins, DPM    [address], [place] [address]                Office [phone]  Fax [phone]

## 2022-02-13 ENCOUNTER — Ambulatory Visit (INDEPENDENT_AMBULATORY_CARE_PROVIDER_SITE_OTHER): Payer: Medicare Other | Admitting: Internal Medicine

## 2022-02-13 ENCOUNTER — Encounter: Payer: Self-pay | Admitting: Internal Medicine

## 2022-02-13 VITALS — BP 128/76 | HR 63 | Ht 61.0 in | Wt 177.0 lb

## 2022-02-13 DIAGNOSIS — M79672 Pain in left foot: Secondary | ICD-10-CM

## 2022-02-13 DIAGNOSIS — E782 Mixed hyperlipidemia: Secondary | ICD-10-CM

## 2022-02-13 DIAGNOSIS — R7303 Prediabetes: Secondary | ICD-10-CM

## 2022-02-13 DIAGNOSIS — Z23 Encounter for immunization: Secondary | ICD-10-CM | POA: Diagnosis not present

## 2022-02-13 DIAGNOSIS — Z1231 Encounter for screening mammogram for malignant neoplasm of breast: Secondary | ICD-10-CM

## 2022-02-13 DIAGNOSIS — K219 Gastro-esophageal reflux disease without esophagitis: Secondary | ICD-10-CM | POA: Diagnosis not present

## 2022-02-13 DIAGNOSIS — I2089 Other forms of angina pectoris: Secondary | ICD-10-CM

## 2022-02-13 DIAGNOSIS — R32 Unspecified urinary incontinence: Secondary | ICD-10-CM

## 2022-02-13 DIAGNOSIS — J479 Bronchiectasis, uncomplicated: Secondary | ICD-10-CM

## 2022-02-13 DIAGNOSIS — R8290 Unspecified abnormal findings in urine: Secondary | ICD-10-CM

## 2022-02-13 DIAGNOSIS — M79671 Pain in right foot: Secondary | ICD-10-CM

## 2022-02-13 DIAGNOSIS — I10 Essential (primary) hypertension: Secondary | ICD-10-CM | POA: Diagnosis not present

## 2022-02-13 DIAGNOSIS — M810 Age-related osteoporosis without current pathological fracture: Secondary | ICD-10-CM

## 2022-02-13 LAB — POCT URINALYSIS DIPSTICK
Bilirubin, UA: NEGATIVE
Glucose, UA: NEGATIVE
Ketones, UA: NEGATIVE
Nitrite, UA: POSITIVE
Protein, UA: NEGATIVE
Spec Grav, UA: 1.015 (ref 1.010–1.025)
Urobilinogen, UA: 0.2 E.U./dL
pH, UA: 6 (ref 5.0–8.0)

## 2022-02-13 MED ORDER — PRAVASTATIN SODIUM 40 MG PO TABS: 40.0000 mg | ORAL_TABLET | Freq: Every day | ORAL | 1 refills | Status: DC

## 2022-02-13 MED ORDER — LOSARTAN POTASSIUM 50 MG PO TABS: 50.0000 mg | ORAL_TABLET | Freq: Every day | ORAL | 1 refills | Status: DC

## 2022-02-13 MED ORDER — GABAPENTIN 300 MG PO CAPS: 600.0000 mg | ORAL_CAPSULE | Freq: Two times a day (BID) | ORAL | 0 refills | Status: DC

## 2022-02-13 MED ORDER — OXYBUTYNIN CHLORIDE ER 15 MG PO TB24: 15.0000 mg | ORAL_TABLET | Freq: Every day | ORAL | 1 refills | Status: DC

## 2022-02-13 NOTE — Patient Instructions (Signed)
Call [facility] to schedule your mammogram at [phone].

## 2022-02-13 NOTE — Progress Notes (Signed)
Date:  02/13/2022   Name:  Kimberly Andrade   DOB:  June 15, 1945   MRN:  [id number]   Chief Complaint: Annual Exam Kimberly Andrade is a 76 y.o. female who presents today for her Complete Annual Exam. She feels well. She reports exercising walking. She reports she is sleeping well. Breast complaints none.  Mammogram: 09/2021 DEXA: 09/2020 osteoporosis Pap smear: discontinued Colonoscopy: 06/2019 repeat 3 yrs  Health Maintenance Due  Topic Date Due   Medicare Annual Wellness (AWV)  04/04/2022    Immunization History  Administered Date(s) Administered   Fluad Quad(high Dose 65+) 12/11/2018, 02/16/2020, 02/19/2021, 02/13/2022   Influenza, High Dose Seasonal PF 11/27/2017   Influenza, Seasonal, Injecte, Preservative Fre 11/27/2017   PNEUMOCOCCAL CONJUGATE-20 02/13/2022   Pneumococcal Conjugate-13 06/29/2015   Pneumococcal Polysaccharide-23 04/29/2011   Tdap 06/29/2015, 07/10/2016   Zoster Recombinat (Shingrix) 10/19/2020, 01/19/2021   Zoster, Live 02/01/2014    Hypertension This is a chronic problem. The problem is controlled. Pertinent negatives include no chest pain, headaches, palpitations or shortness of breath. Past treatments include angiotensin blockers. The current treatment provides significant improvement. Hypertensive end-organ damage includes CAD/MI. There is no history of kidney disease or CVA.  Hyperlipidemia This is a chronic problem. The problem is controlled. Pertinent negatives include no chest pain or shortness of breath. Current antihyperlipidemic treatment includes statins. The current treatment provides significant improvement of lipids.  Diabetes Pertinent negatives for hypoglycemia include no dizziness, headaches, nervousness/anxiousness or tremors. Pertinent negatives for diabetes include no chest pain, no fatigue, no polydipsia and no polyuria. Pertinent negatives for diabetic complications include no CVA.  Gastroesophageal Reflux She complains of  heartburn. She reports no abdominal pain, no chest pain, no coughing or no wheezing. This is a recurrent problem. The problem occurs occasionally. Pertinent negatives include no fatigue. She has tried a PPI for the symptoms. Past procedures include an EGD.  OP - treated by Endo with Prolia. Bronchiectasis - recently dx'd by Pulmonary.  Stable shortness of breath and cough.  No fever or chills.   Lab Results  Component Value Date   NA 140 02/19/2021   K 4.5 02/19/2021   CO2 25 02/19/2021   GLUCOSE 93 02/19/2021   BUN 13 02/19/2021   CREATININE 0.93 02/19/2021   CALCIUM 9.8 02/19/2021   EGFR 64 02/19/2021   GFRNONAA >60 04/24/2020   Lab Results  Component Value Date   CHOL 148 02/19/2021   HDL 48 02/19/2021   LDLCALC 71 02/19/2021   TRIG 172 (H) 02/19/2021   CHOLHDL 3.1 02/19/2021   Lab Results  Component Value Date   TSH 2.750 02/19/2021   Lab Results  Component Value Date   HGBA1C 5.7 (H) 02/19/2021   Lab Results  Component Value Date   WBC 6.2 02/19/2021   HGB 13.3 02/19/2021   HCT 41.0 02/19/2021   MCV 87 02/19/2021   PLT 245 02/19/2021   Lab Results  Component Value Date   ALT 14 02/19/2021   AST 17 02/19/2021   ALKPHOS 44 02/19/2021   BILITOT 0.5 02/19/2021   Lab Results  Component Value Date   VD25OH 35.7 07/27/2019     Review of Systems  Constitutional:  Negative for chills, fatigue and fever.  HENT:  Negative for congestion, hearing loss, tinnitus, trouble swallowing and voice change.   Eyes:  Negative for visual disturbance.  Respiratory:  Negative for cough, chest tightness, shortness of breath and wheezing.   Cardiovascular:  Negative for chest pain, palpitations and  leg swelling.  Gastrointestinal:  Positive for heartburn. Negative for abdominal pain, constipation, diarrhea and vomiting.  Endocrine: Negative for polydipsia and polyuria.  Genitourinary:  Negative for dysuria, frequency, genital sores, vaginal bleeding and vaginal discharge.   Musculoskeletal:  Negative for arthralgias, gait problem and joint swelling.  Skin:  Negative for color change and rash.  Neurological:  Negative for dizziness, tremors, light-headedness and headaches.  Hematological:  Negative for adenopathy. Does not bruise/bleed easily.  Psychiatric/Behavioral:  Negative for dysphoric mood and sleep disturbance. The patient is not nervous/anxious.     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   Essential hypertension 08/16/2019   Morton's neuroma 07/27/2019   Atherosclerosis of abdominal aorta ([place]) 03/16/2018   DDD (degenerative disc disease), 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/2017   Prediabetes 12/25/2016   Nocturnal leg cramps 12/25/2016   Postmenopausal osteoporosis 10/16/2016   Urinary incontinence in female 10/16/2016   Mixed hyperlipidemia 10/05/2015   GERD (gastroesophageal reflux disease) 04/24/2015   Crohn's disease of small intestine without complication ([place]) 48/27/0786   Obstructive sleep apnea of adult 11/18/2013    Allergies  Allergen Reactions   Fosamax [Alendronate Sodium] Other (See Comments)    Bones hurt   Lipitor [Atorvastatin] Other (See Comments)    Bone pain   Codeine Other (See Comments)     Codeine in the liquid form causes gastritis   Hydrocodone Nausea And Vomiting    Past Surgical History:  Procedure Laterality Date   ABDOMINAL HYSTERECTOMY     APPENDECTOMY     CARPEL TUNNEL     CHOLECYSTECTOMY     COLONOSCOPY WITH PROPOFOL N/A 03/11/2016   Procedure: COLONOSCOPY WITH PROPOFOL;  Surgeon: Lollie Sails, MD;  Location: [facility] ENDOSCOPY;  Service: Endoscopy;  Laterality: N/A;   DILATION AND CURETTAGE OF UTERUS     ESOPHAGOGASTRODUODENOSCOPY N/A 03/11/2016   Procedure: ESOPHAGOGASTRODUODENOSCOPY (EGD);  Surgeon: Lollie Sails,  MD;  Location[facility] ENDOSCOPY;  Service: Endoscopy;  Laterality: N/A;   ESOPHAGOGASTRODUODENOSCOPY (EGD) WITH PROPOFOL N/A 11/11/2017   Procedure: ESOPHAGOGASTRODUODENOSCOPY (EGD) WITH PROPOFOL;  Surgeon: Lollie Sails, MD;  Location: [facility] ENDOSCOPY;  Service: Endoscopy;  Laterality: N/A;   ESOPHAGOGASTRODUODENOSCOPY (EGD) WITH PROPOFOL N/A 01/20/2018   Procedure: ESOPHAGOGASTRODUODENOSCOPY (EGD) WITH PROPOFOL;  Surgeon: Lollie Sails, MD;  Location: [facility] ENDOSCOPY;  Service: Endoscopy;  Laterality: N/A;   FOOT SURGERY Right    HAMMER TOE SURGERY     JOINT REPLACEMENT Right 2006   shoulder   KNEE ARTHROSCOPY Right 05/25/2013   Procedure: ARTHROSCOPY KNEE;  Surgeon: Hessie Dibble, MD;  Location: [place];  Service: Orthopedics;  Laterality: Right;  medial and lateral meniscal debridment and chondroplasty   righti shoulder replacement      ROBOTIC ASSISTED LAPAROSCOPIC REPAIR OF PARAESOPHAGEAL HERNIA  07/16/2018   SHOULDER ARTHROSCOPY WITH ROTATOR CUFF REPAIR AND SUBACROMIAL DECOMPRESSION Left 08/14/2017   Procedure: SHOULDER ARTHROSCOPY WITH ROTATOR CUFF REPAIR AND SUBACROMIAL DECOMPRESSION;  Surgeon: Tania Ade, MD;  Location: [place];  Service: Orthopedics;  Laterality: Left;   SHOULDER SURGERY Right    X 3   TOTAL HIP ARTHROPLASTY Left 11/25/2017   Procedure: LEFT TOTAL HIP ARTHROPLASTY ANTERIOR APPROACH;  Surgeon: Melrose Nakayama, MD;  Location: [place];  Service: Orthopedics;  Laterality: Left;   TOTAL KNEE ARTHROPLASTY Right 04/18/2020   Procedure: RIGHT TOTAL  KNEE ARTHROPLASTY;  Surgeon: Melrose Nakayama, MD;  Location: [facility];  Service: Orthopedics;  Laterality: Right;    Social History   Tobacco Use   Smoking status: Former    Types: Cigarettes    Quit date: 12/29/2001    Years since quitting: 20.1   Smokeless tobacco: Never  Vaping Use   Vaping Use: Never used  Substance Use Topics   Alcohol use: No   Drug use: No     Medication list has been  reviewed and updated.  Current Meds  Medication Sig   albuterol (VENTOLIN HFA) 108 (90 Base) MCG/ACT inhaler SMARTSIG:2 Puff(s) By Mouth Every 6 Hours PRN   Ascorbic Acid (VITAMIN C PO) Take 500 mg by mouth daily.   aspirin EC 81 MG tablet Take 1 tablet (81 mg total) by mouth 2 (two) times daily after a meal.   Calcium Carbonate-Vit D-Min (CALCIUM 600+D3 PLUS MINERALS) 600-800 MG-UNIT TABS Take 1 tablet by mouth daily.   cholecalciferol (VITAMIN D) 25 MCG (1000 UNIT) tablet Take 1,000 Units by mouth daily.   isosorbide mononitrate (IMDUR) 30 MG 24 hr tablet Take 30 mg by mouth daily.   Magnesium 250 MG TABS Take 250 mg by mouth daily.    meclizine (ANTIVERT) 25 MG tablet Take 1 tablet (25 mg total) by mouth 3 (three) times daily as needed for dizziness.   meloxicam (MOBIC) 15 MG tablet Take 15 mg by mouth daily.   mesalamine (PENTASA) 500 MG CR capsule Take 2,000 mg by mouth 2 (two) times daily as needed (Crohn's).   Multiple Vitamins-Minerals (MULTIVITAMIN WITH MINERALS) tablet Take 1 tablet by mouth daily.   NON FORMULARY BiPap nightly   pantoprazole (PROTONIX) 40 MG tablet Take 40 mg by mouth every morning.   Potassium 99 MG TABS Take 99 mg by mouth daily.    prednisoLONE acetate (PRED FORTE) 1 % ophthalmic suspension Place 1 drop into the right eye 2 (two) times daily.   PROLIA 60 MG/ML SOSY injection Inject 60 mg into the skin every 6 (six) months.   vitamin B-12 (CYANOCOBALAMIN) 1000 MCG tablet Take 1,000 mcg by mouth daily.   zinc gluconate 50 MG tablet Take 50 mg by mouth daily.   [DISCONTINUED] gabapentin (NEURONTIN) 300 MG capsule Take 2 capsules (600 mg total) by mouth 2 (two) times daily. (Patient taking differently: Take 300 mg by mouth daily.)   [DISCONTINUED] losartan (COZAAR) 50 MG tablet Take 1 tablet (50 mg total) by mouth daily.   [DISCONTINUED] oxybutynin (DITROPAN XL) 15 MG 24 hr tablet Take 1 tablet (15 mg total) by mouth at bedtime.   [DISCONTINUED] pravastatin  (PRAVACHOL) 40 MG tablet Take 1 tablet (40 mg total) by mouth daily.       02/13/2022    8:51 AM 09/06/2021    9:45 AM 03/20/2021   11:08 AM 03/09/2021    9:27 AM  GAD 7 : Generalized Anxiety Score  Nervous, Anxious, on Edge 0 0 0 0  Control/stop worrying 0 0 0 0  Worry too much - different things 0 0 0 0  Trouble relaxing 1 0 0 0  Restless 0 0 0 0  Easily annoyed or irritable 0 1 0 0  Afraid - awful might happen 0 0 0 0  Total GAD 7 Score 1 1 0 0  Anxiety Difficulty Somewhat difficult Not difficult at all Not difficult at all Not difficult at all       02/13/2022    8:51 AM 09/06/2021  9:45 AM 04/04/2021   10:06 AM  Depression screen PHQ 2/9  Decreased Interest 0 0 0  Down, Depressed, Hopeless 0 1 0  PHQ - 2 Score 0 1 0  Altered sleeping 1 0 0  Tired, decreased energy _0 Change in appetite 1 0 0  Feeling bad or failure about yourself  0 0 0  Trouble concentrating 0 2 0  Moving slowly or fidgety/restless 0 0 0  Suicidal thoughts 0 0 0  PHQ-9 Score _1 Difficult doing work/chores Somewhat difficult Not difficult at all Not difficult at all    BP Readings from Last 3 Encounters:  02/13/22 128/76  10/01/21 137/75  09/06/21 128/78    Physical Exam Vitals and nursing note reviewed.  Constitutional:      General: She is not in acute distress.    Appearance: She is well-developed.  HENT:     Head: Normocephalic and atraumatic.     Right Ear: Tympanic membrane and ear canal normal.     Left Ear: Tympanic membrane and ear canal normal.     Nose:     Right Sinus: No maxillary sinus tenderness.     Left Sinus: No maxillary sinus tenderness.  Eyes:     General: No scleral icterus.       Right eye: No discharge.        Left eye: No discharge.     Conjunctiva/sclera: Conjunctivae normal.  Neck:     Thyroid: No thyromegaly.     Vascular: No carotid bruit.  Cardiovascular:     Rate and Rhythm: Normal rate and regular rhythm.     Pulses: Normal pulses.     Heart  sounds: Normal heart sounds.  Pulmonary:     Effort: Pulmonary effort is normal. No respiratory distress.     Breath sounds: No wheezing.  Chest:  Breasts:    Right: No mass, nipple discharge, skin change or tenderness.     Left: No mass, nipple discharge, skin change or tenderness.  Abdominal:     General: Bowel sounds are normal.     Palpations: Abdomen is soft.     Tenderness: There is no abdominal tenderness.  Musculoskeletal:     Cervical back: Normal range of motion. No erythema.     Right lower leg: No edema.     Left lower leg: No edema.  Lymphadenopathy:     Cervical: No cervical adenopathy.  Skin:    General: Skin is warm and dry.     Findings: No rash.  Neurological:     Mental Status: She is alert and oriented to person, place, and time.     Cranial Nerves: No cranial nerve deficit.     Sensory: No sensory deficit.     Deep Tendon Reflexes: Reflexes are normal and symmetric.  Psychiatric:        Attention and Perception: Attention normal.        Mood and Affect: Mood normal.     Wt Readings from Last 3 Encounters:  02/13/22 177 lb (80.3 kg)  10/01/21 179 lb (81.2 kg)  09/06/21 180 lb (81.6 kg)    BP 128/76   Pulse 63   Ht _2  (1.549 m)   Wt 177 lb (80.3 kg)   SpO2 94%   BMI 33.44 kg/m   Assessment and Plan: 1. Essential hypertension Clinically stable exam with well controlled BP. Tolerating medications without side effects at this time. Pt to continue current regimen  and low sodium diet; benefits of regular exercise as able discussed. - CBC with Differential/Platelet - Comprehensive metabolic panel - POCT urinalysis dipstick - UA positive will send cx then treat if needed - TSH - losartan (COZAAR) 50 MG tablet; Take 1 tablet (50 mg total) by mouth daily.  Dispense: 90 tablet; Refill: 1  2. Gastroesophageal reflux disease without esophagitis Symptoms well controlled on daily PPI No red flag signs such as weight loss, n/v, melena - CBC with  Differential/Platelet  3. Mixed hyperlipidemia Tolerating statin medication without side effects at this time LDL is at goal of < 70 on current dose Continue same therapy without change at this time. - Lipid panel - pravastatin (PRAVACHOL) 40 MG tablet; Take 1 tablet (40 mg total) by mouth daily.  Dispense: 90 tablet; Refill: 1  4. Prediabetes Stable, continue low carb diet - Comprehensive metabolic panel - Hemoglobin A1c  5. Postmenopausal osteoporosis On Prolia via Endocrinology - VITAMIN D 25 Hydroxy (Vit-D Deficiency, Fractures)  6. Stable angina pectoris Followed by Cardiology  7. Need for vaccination for pneumococcus  8. Foot pain, bilateral Continue gabapentin - gabapentin (NEURONTIN) 300 MG capsule; Take 2 capsules (600 mg total) by mouth 2 (two) times daily.  Dispense: 360 capsule; Refill: 0  9. Bronchiectasis without complication ([place]) Recent dx by Pulmonary Symptoms are slowly worsening  10. Urinary incontinence in female - oxybutynin (DITROPAN XL) 15 MG 24 hr tablet; Take 1 tablet (15 mg total) by mouth at bedtime.  Dispense: 90 tablet; Refill: 1  11. Encounter for screening mammogram for breast cancer Schedule in July - MM 3D SCREEN BREAST BILATERAL   Partially dictated using Editor, commissioning. Any errors are unintentional.  Halina Maidens, MD [facility]  02/13/2022

## 2022-02-14 LAB — CBC WITH DIFFERENTIAL/PLATELET
Basophils Absolute: 0.1 10*3/uL (ref 0.0–0.2)
Basos: 1 %
EOS (ABSOLUTE): 0.5 10*3/uL — ABNORMAL HIGH (ref 0.0–0.4)
Eos: 8 %
Hematocrit: 36.3 % (ref 34.0–46.6)
Hemoglobin: 11.8 g/dL (ref 11.1–15.9)
Immature Grans (Abs): 0 10*3/uL (ref 0.0–0.1)
Immature Granulocytes: 0 %
Lymphocytes Absolute: 1.5 10*3/uL (ref 0.7–3.1)
Lymphs: 23 %
MCH: 28.1 pg (ref 26.6–33.0)
MCHC: 32.5 g/dL (ref 31.5–35.7)
MCV: 86 fL (ref 79–97)
Monocytes Absolute: 0.6 10*3/uL (ref 0.1–0.9)
Monocytes: 9 %
Neutrophils Absolute: 4 10*3/uL (ref 1.4–7.0)
Neutrophils: 59 %
Platelets: 276 10*3/uL (ref 150–450)
RBC: 4.2 x10E6/uL (ref 3.77–5.28)
RDW: 13.2 % (ref 11.7–15.4)
WBC: 6.8 10*3/uL (ref 3.4–10.8)

## 2022-02-14 LAB — COMPREHENSIVE METABOLIC PANEL
ALT: 12 IU/L (ref 0–32)
AST: 10 IU/L (ref 0–40)
Albumin/Globulin Ratio: 1.8 (ref 1.2–2.2)
Albumin: 3.9 g/dL (ref 3.8–4.8)
Alkaline Phosphatase: 43 IU/L — ABNORMAL LOW (ref 44–121)
BUN/Creatinine Ratio: 20 (ref 12–28)
BUN: 20 mg/dL (ref 8–27)
Bilirubin Total: 0.4 mg/dL (ref 0.0–1.2)
CO2: 23 mmol/L (ref 20–29)
Calcium: 9.8 mg/dL (ref 8.7–10.3)
Chloride: 101 mmol/L (ref 96–106)
Creatinine, Ser: 0.98 mg/dL (ref 0.57–1.00)
Globulin, Total: 2.2 g/dL (ref 1.5–4.5)
Glucose: 116 mg/dL — ABNORMAL HIGH (ref 70–99)
Potassium: 4.3 mmol/L (ref 3.5–5.2)
Sodium: 138 mmol/L (ref 134–144)
Total Protein: 6.1 g/dL (ref 6.0–8.5)
eGFR: 60 mL/min/{1.73_m2} (ref >59)

## 2022-02-14 LAB — VITAMIN D 25 HYDROXY (VIT D DEFICIENCY, FRACTURES): Vit D, 25-Hydroxy: 42.5 ng/mL (ref 30.0–100.0)

## 2022-02-14 LAB — LIPID PANEL
Chol/HDL Ratio: 3.1 ratio (ref 0.0–4.4)
Cholesterol, Total: 123 mg/dL (ref 100–199)
HDL: 40 mg/dL (ref >39)
LDL Chol Calc (NIH): 65 mg/dL (ref 0–99)
Triglycerides: 91 mg/dL (ref 0–149)
VLDL Cholesterol Cal: 18 mg/dL (ref 5–40)

## 2022-02-14 LAB — TSH: TSH: 2.3 u[IU]/mL (ref 0.450–4.500)

## 2022-02-14 LAB — HEMOGLOBIN A1C
Est. average glucose Bld gHb Est-mCnc: 131 mg/dL
Hgb A1c MFr Bld: 6.2 % — ABNORMAL HIGH (ref 4.8–5.6)

## 2022-02-16 LAB — URINE CULTURE

## 2022-02-18 ENCOUNTER — Encounter: Payer: Self-pay | Admitting: Internal Medicine

## 2022-02-18 ENCOUNTER — Other Ambulatory Visit: Payer: Self-pay | Admitting: Internal Medicine

## 2022-02-18 DIAGNOSIS — N3000 Acute cystitis without hematuria: Secondary | ICD-10-CM

## 2022-02-18 MED ORDER — SULFAMETHOXAZOLE-TRIMETHOPRIM 800-160 MG PO TABS: 1.0000 | ORAL_TABLET | Freq: Two times a day (BID) | ORAL | 0 refills | Status: AC

## 2022-03-11 ENCOUNTER — Encounter: Payer: Self-pay | Admitting: Internal Medicine

## 2022-03-11 ENCOUNTER — Other Ambulatory Visit: Payer: Self-pay

## 2022-03-11 DIAGNOSIS — I10 Essential (primary) hypertension: Secondary | ICD-10-CM

## 2022-03-11 DIAGNOSIS — R32 Unspecified urinary incontinence: Secondary | ICD-10-CM

## 2022-03-11 DIAGNOSIS — E782 Mixed hyperlipidemia: Secondary | ICD-10-CM

## 2022-03-11 MED ORDER — LOSARTAN POTASSIUM 50 MG PO TABS: 50.0000 mg | ORAL_TABLET | Freq: Every day | ORAL | 1 refills | Status: DC

## 2022-03-11 MED ORDER — OXYBUTYNIN CHLORIDE ER 15 MG PO TB24: 15.0000 mg | ORAL_TABLET | Freq: Every day | ORAL | 1 refills | Status: DC

## 2022-03-11 MED ORDER — PRAVASTATIN SODIUM 40 MG PO TABS: 40.0000 mg | ORAL_TABLET | Freq: Every day | ORAL | 1 refills | Status: DC

## 2022-03-20 ENCOUNTER — Encounter: Payer: Self-pay | Admitting: Internal Medicine

## 2022-03-21 ENCOUNTER — Telehealth: Payer: Self-pay | Admitting: Internal Medicine

## 2022-03-21 NOTE — Telephone Encounter (Signed)
Spoke with patient yesterday in my chart message about this. Told patient it is her choice.,  Kimberly Andrade

## 2022-03-21 NOTE — Telephone Encounter (Signed)
Patient: Kimberly Andrade Phone: [phone] Provider: Halina Maidens  Spoke with patient to schedule her AWV.  She would like to send message to Dr Mickel Baas that her pulmonologist advised her to get her RSV vaccine.  She would like to know is it okay to for get it the vaccine.  Please advise.

## 2022-03-29 ENCOUNTER — Encounter: Payer: Self-pay | Admitting: Podiatry

## 2022-04-08 ENCOUNTER — Ambulatory Visit: Payer: Medicare Other

## 2022-04-09 ENCOUNTER — Ambulatory Visit (INDEPENDENT_AMBULATORY_CARE_PROVIDER_SITE_OTHER): Payer: Medicare Other | Admitting: Internal Medicine

## 2022-04-09 ENCOUNTER — Encounter: Payer: Self-pay | Admitting: Internal Medicine

## 2022-04-09 VITALS — BP 150/84 | HR 56 | Temp 99.1°F | Ht 61.0 in | Wt 175.6 lb

## 2022-04-09 DIAGNOSIS — U071 COVID-19: Secondary | ICD-10-CM

## 2022-04-09 LAB — POC COVID19 BINAXNOW: SARS Coronavirus 2 Ag: POSITIVE — AB

## 2022-04-09 MED ORDER — PROMETHAZINE-DM 6.25-15 MG/5ML PO SYRP: 5.0000 mL | ORAL_SOLUTION | Freq: Four times a day (QID) | ORAL | 0 refills | Status: DC | PRN

## 2022-04-09 NOTE — Progress Notes (Signed)
Date:  04/09/2022   Name:  Kimberly Andrade   DOB:  1945/05/05   MRN:  [id number]   Chief Complaint: Sinusitis (Headache, runny nose, cough, sinus pressure on left side of head, scratchy throat.)  URI  This is a new problem. The current episode started yesterday. The problem has been gradually worsening. The fever has been present for Less than 1 day. Associated symptoms include congestion, coughing, headaches, sinus pain and a sore throat.    Lab Results  Component Value Date   NA 138 02/13/2022   K 4.3 02/13/2022   CO2 23 02/13/2022   GLUCOSE 116 (H) 02/13/2022   BUN 20 02/13/2022   CREATININE 0.98 02/13/2022   CALCIUM 9.8 02/13/2022   EGFR 60 02/13/2022   GFRNONAA >60 04/24/2020   Lab Results  Component Value Date   CHOL 123 02/13/2022   HDL 40 02/13/2022   LDLCALC 65 02/13/2022   TRIG 91 02/13/2022   CHOLHDL 3.1 02/13/2022   Lab Results  Component Value Date   TSH 2.300 02/13/2022   Lab Results  Component Value Date   HGBA1C 6.2 (H) 02/13/2022   Lab Results  Component Value Date   WBC 6.8 02/13/2022   HGB 11.8 02/13/2022   HCT 36.3 02/13/2022   MCV 86 02/13/2022   PLT 276 02/13/2022   Lab Results  Component Value Date   ALT 12 02/13/2022   AST 10 02/13/2022   ALKPHOS 43 (L) 02/13/2022   BILITOT 0.4 02/13/2022   Lab Results  Component Value Date   VD25OH 42.5 02/13/2022     Review of Systems  HENT:  Positive for congestion, sinus pain and sore throat.   Respiratory:  Positive for cough.   Neurological:  Positive for headaches.    Patient Active Problem List   Diagnosis Date Noted   Bronchiectasis without complication ([place]) 14/43/1540   Stable angina pectoris 04/19/2021   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   Lymphedema 03/30/2021   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   Essential hypertension 08/16/2019   Morton's neuroma 07/27/2019   Atherosclerosis of abdominal aorta ([place]) 03/16/2018   DDD (degenerative disc disease), lumbosacral  08/12/2017   Prediabetes 12/25/2016   Nocturnal leg cramps 12/25/2016   Postmenopausal osteoporosis 10/16/2016   Urinary incontinence in female 10/16/2016   Mixed hyperlipidemia 10/05/2015   GERD (gastroesophageal reflux disease) 04/24/2015   Crohn's disease of small intestine without complication ([place]) 08/67/6195   Obstructive sleep apnea of adult 11/18/2013    Allergies  Allergen Reactions   Fosamax [Alendronate Sodium] Other (See Comments)    Bones hurt   Lipitor [Atorvastatin] Other (See Comments)    Bone pain   Codeine Other (See Comments)     Codeine in the liquid form causes gastritis   Hydrocodone Nausea And Vomiting    Past Surgical History:  Procedure Laterality Date   ABDOMINAL HYSTERECTOMY     APPENDECTOMY     CARPEL TUNNEL     CHOLECYSTECTOMY     COLONOSCOPY WITH PROPOFOL N/A 03/11/2016   Procedure: COLONOSCOPY WITH PROPOFOL;  Surgeon: Lollie Sails, MD;  Location: [facility] ENDOSCOPY;  Service: Endoscopy;  Laterality: N/A;   DILATION AND CURETTAGE OF UTERUS     ESOPHAGOGASTRODUODENOSCOPY N/A 03/11/2016   Procedure: ESOPHAGOGASTRODUODENOSCOPY (EGD);  Surgeon: Lollie Sails, MD;  Location: [facility] ENDOSCOPY;  Service: Endoscopy;  Laterality: N/A;   ESOPHAGOGASTRODUODENOSCOPY (EGD) WITH PROPOFOL N/A 11/11/2017   Procedure: ESOPHAGOGASTRODUODENOSCOPY (EGD) WITH PROPOFOL;  Surgeon: Lollie Sails, MD;  Location: [facility]  ENDOSCOPY;  Service: Endoscopy;  Laterality: N/A;   ESOPHAGOGASTRODUODENOSCOPY (EGD) WITH PROPOFOL N/A 01/20/2018   Procedure: ESOPHAGOGASTRODUODENOSCOPY (EGD) WITH PROPOFOL;  Surgeon: Lollie Sails, MD;  Location: [facility] ENDOSCOPY;  Service: Endoscopy;  Laterality: N/A;   FOOT SURGERY Right    HAMMER TOE SURGERY     JOINT REPLACEMENT Right 2006   shoulder   KNEE ARTHROSCOPY Right 05/25/2013   Procedure: ARTHROSCOPY KNEE;  Surgeon: Hessie Dibble, MD;  Location: [place];  Service: Orthopedics;  Laterality: Right;  medial and lateral  meniscal debridment and chondroplasty   righti shoulder replacement      ROBOTIC ASSISTED LAPAROSCOPIC REPAIR OF PARAESOPHAGEAL HERNIA  07/16/2018   SHOULDER ARTHROSCOPY WITH ROTATOR CUFF REPAIR AND SUBACROMIAL DECOMPRESSION Left 08/14/2017   Procedure: SHOULDER ARTHROSCOPY WITH ROTATOR CUFF REPAIR AND SUBACROMIAL DECOMPRESSION;  Surgeon: Tania Ade, MD;  Location: [place];  Service: Orthopedics;  Laterality: Left;   SHOULDER SURGERY Right    X 3   TOTAL HIP ARTHROPLASTY Left 11/25/2017   Procedure: LEFT TOTAL HIP ARTHROPLASTY ANTERIOR APPROACH;  Surgeon: Melrose Nakayama, MD;  Location: [place];  Service: Orthopedics;  Laterality: Left;   TOTAL KNEE ARTHROPLASTY Right 04/18/2020   Procedure: RIGHT TOTAL KNEE ARTHROPLASTY;  Surgeon: Melrose Nakayama, MD;  Location: [facility];  Service: Orthopedics;  Laterality: Right;    Social History   Tobacco Use   Smoking status: Former    Types: Cigarettes    Quit date: 12/29/2001    Years since quitting: 20.2   Smokeless tobacco: Never  Vaping Use   Vaping Use: Never used  Substance Use Topics   Alcohol use: No   Drug use: No     Medication list has been reviewed and updated.  Current Meds  Medication Sig   albuterol (VENTOLIN HFA) 108 (90 Base) MCG/ACT inhaler SMARTSIG:2 Puff(s) By Mouth Every 6 Hours PRN   Ascorbic Acid (VITAMIN C PO) Take 500 mg by mouth daily.   aspirin EC 81 MG tablet Take 1 tablet (81 mg total) by mouth 2 (two) times daily after a meal.   Calcium Carbonate-Vit D-Min (CALCIUM 600+D3 PLUS MINERALS) 600-800 MG-UNIT TABS Take 1 tablet by mouth daily.   cholecalciferol (VITAMIN D) 25 MCG (1000 UNIT) tablet Take 1,000 Units by mouth daily.   gabapentin (NEURONTIN) 300 MG capsule Take 2 capsules (600 mg total) by mouth 2 (two) times daily.   isosorbide mononitrate (IMDUR) 30 MG 24 hr tablet Take 30 mg by mouth daily.   losartan (COZAAR) 50 MG tablet Take 1 tablet (50 mg total) by mouth daily.   Magnesium 250 MG TABS Take 250  mg by mouth daily.    meclizine (ANTIVERT) 25 MG tablet Take 1 tablet (25 mg total) by mouth 3 (three) times daily as needed for dizziness.   meloxicam (MOBIC) 15 MG tablet Take 15 mg by mouth daily.   mesalamine (PENTASA) 500 MG CR capsule Take 2,000 mg by mouth 2 (two) times daily as needed (Crohn's).   Multiple Vitamins-Minerals (MULTIVITAMIN WITH MINERALS) tablet Take 1 tablet by mouth daily.   NON FORMULARY BiPap nightly   oxybutynin (DITROPAN XL) 15 MG 24 hr tablet Take 1 tablet (15 mg total) by mouth at bedtime.   pantoprazole (PROTONIX) 40 MG tablet Take 40 mg by mouth every morning.   Potassium 99 MG TABS Take 99 mg by mouth daily.    pravastatin (PRAVACHOL) 40 MG tablet Take 1 tablet (40 mg total) by mouth daily.   prednisoLONE acetate (PRED  FORTE) 1 % ophthalmic suspension Place 1 drop into the right eye 2 (two) times daily.   PROLIA 60 MG/ML SOSY injection Inject 60 mg into the skin every 6 (six) months.   promethazine-dextromethorphan (PROMETHAZINE-DM) 6.25-15 MG/5ML syrup Take 5 mLs by mouth 4 (four) times daily as needed for up to 9 days for cough.   vitamin B-12 (CYANOCOBALAMIN) 1000 MCG tablet Take 1,000 mcg by mouth daily.   zinc gluconate 50 MG tablet Take 50 mg by mouth daily.       02/13/2022    8:51 AM 09/06/2021    9:45 AM 03/20/2021   11:08 AM 03/09/2021    9:27 AM  GAD 7 : Generalized Anxiety Score  Nervous, Anxious, on Edge 0 0 0 0  Control/stop worrying 0 0 0 0  Worry too much - different things 0 0 0 0  Trouble relaxing 1 0 0 0  Restless 0 0 0 0  Easily annoyed or irritable 0 1 0 0  Afraid - awful might happen 0 0 0 0  Total GAD 7 Score 1 1 0 0  Anxiety Difficulty Somewhat difficult Not difficult at all Not difficult at all Not difficult at all       02/13/2022    8:51 AM 09/06/2021    9:45 AM 04/04/2021   10:06 AM  Depression screen PHQ 2/9  Decreased Interest 0 0 0  Down, Depressed, Hopeless 0 1 0  PHQ - 2 Score 0 1 0  Altered sleeping 1 0 0  Tired,  decreased energy '1 1 1  '$ Change in appetite 1 0 0  Feeling bad or failure about yourself  0 0 0  Trouble concentrating 0 2 0  Moving slowly or fidgety/restless 0 0 0  Suicidal thoughts 0 0 0  PHQ-9 Score '3 4 1  '$ Difficult doing work/chores Somewhat difficult Not difficult at all Not difficult at all    BP Readings from Last 3 Encounters:  04/09/22 (!) 150/84  02/13/22 128/76  10/01/21 137/75    Physical Exam Constitutional:      Appearance: Normal appearance.  HENT:     Right Ear: Tympanic membrane normal.     Left Ear: Tympanic membrane normal.     Nose:     Right Sinus: Maxillary sinus tenderness present. No frontal sinus tenderness.     Left Sinus: Maxillary sinus tenderness present. No frontal sinus tenderness.  Neck:     Vascular: No carotid bruit.  Cardiovascular:     Rate and Rhythm: Normal rate and regular rhythm.     Pulses: Normal pulses.  Pulmonary:     Effort: Pulmonary effort is normal.     Breath sounds: No wheezing or rhonchi.  Musculoskeletal:     Cervical back: Normal range of motion.  Lymphadenopathy:     Cervical: No cervical adenopathy.  Neurological:     Mental Status: She is alert.     Wt Readings from Last 3 Encounters:  04/09/22 175 lb 9.6 oz (79.7 kg)  02/13/22 177 lb (80.3 kg)  10/01/21 179 lb (81.2 kg)    BP (!) 150/84   Pulse (!) 56   Temp 99.1 F (37.3 C) (Oral)   Ht '5\' 1"'$  (1.549 m)   Wt 175 lb 9.6 oz (79.7 kg)   SpO2 95%   BMI 33.18 kg/m   Assessment and Plan: Problem List Items Addressed This Visit   None Visit Diagnoses     COVID-19 virus infection    -  Primary   Recommend Tylenol, coricidin and flonase for congestion push fluids defer Paxlovid since symptoms are mild follow up worsening   Relevant Medications   promethazine-dextromethorphan (PROMETHAZINE-DM) 6.25-15 MG/5ML syrup   Other Relevant Orders   POC COVID-19 BinaxNow (Completed)        Partially dictated using Editor, commissioning. Any errors are  unintentional.  Halina Maidens, MD [facility]  04/09/2022

## 2022-04-09 NOTE — Patient Instructions (Signed)
Tylenol every 8 hours  Coricidin take as directed on the box  Flonase spray

## 2022-04-10 ENCOUNTER — Ambulatory Visit (INDEPENDENT_AMBULATORY_CARE_PROVIDER_SITE_OTHER): Payer: Medicare Other

## 2022-04-10 VITALS — Ht 61.0 in | Wt 177.0 lb

## 2022-04-10 DIAGNOSIS — Z0000 Encounter for general adult medical examination without abnormal findings: Secondary | ICD-10-CM | POA: Diagnosis not present

## 2022-04-10 NOTE — Patient Instructions (Signed)
Ms. Kimberly Andrade , Thank you for taking time to come for your Medicare Wellness Visit. I appreciate your ongoing commitment to your health goals. Please review the following plan we discussed and let me know if I can assist you in the future.   These are the goals we discussed:  Goals      DIET - EAT MORE FRUITS AND VEGETABLES     Weight (lb) < 150 lb (68 kg)     Pt states she would like to lose weight over the next year with diet and exercise        This is a list of the screening recommended for you and due dates:  Health Maintenance  Topic Date Due   Colon Cancer Screening  06/15/2022   Mammogram  09/28/2022   Medicare Annual Wellness Visit  04/11/2023   DTaP/Tdap/Td vaccine (3 - Td or Tdap) 07/11/2026   Pneumonia Vaccine  Completed   Flu Shot  Completed   DEXA scan (bone density measurement)  Completed   Hepatitis C Screening: USPSTF Recommendation to screen - Ages 45-79 yo.  Completed   Zoster (Shingles) Vaccine  Completed   HPV Vaccine  Aged Out   COVID-19 Vaccine  Discontinued    Advanced directives: no  Conditions/risks identified: none  Next appointment: Follow up in one year for your annual wellness visit 04/16/23 @ 9:30 am by phone   Preventive Care 65 Years and Older, Female Preventive care refers to lifestyle choices and visits with your health care provider that can promote health and wellness. What does preventive care include? A yearly physical exam. This is also called an annual well check. Dental exams once or twice a year. Routine eye exams. Ask your health care provider how often you should have your eyes checked. Personal lifestyle choices, including: Daily care of your teeth and gums. Regular physical activity. Eating a healthy diet. Avoiding tobacco and drug use. Limiting alcohol use. Practicing safe sex. Taking low-dose aspirin every day. Taking vitamin and mineral supplements as recommended by your health care provider. What happens during an  annual well check? The services and screenings done by your health care provider during your annual well check will depend on your age, overall health, lifestyle risk factors, and family history of disease. Counseling  Your health care provider may ask you questions about your: Alcohol use. Tobacco use. Drug use. Emotional well-being. Home and relationship well-being. Sexual activity. Eating habits. History of falls. Memory and ability to understand (cognition). Work and work Statistician. Reproductive health. Screening  You may have the following tests or measurements: Height, weight, and BMI. Blood pressure. Lipid and cholesterol levels. These may be checked every 5 years, or more frequently if you are over 86 years old. Skin check. Lung cancer screening. You may have this screening every year starting at age 56 if you have a 30-pack-year history of smoking and currently smoke or have quit within the past 15 years. Fecal occult blood test (FOBT) of the stool. You may have this test every year starting at age 78. Flexible sigmoidoscopy or colonoscopy. You may have a sigmoidoscopy every 5 years or a colonoscopy every 10 years starting at age 72. Hepatitis C blood test. Hepatitis B blood test. Sexually transmitted disease (STD) testing. Diabetes screening. This is done by checking your blood sugar (glucose) after you have not eaten for a while (fasting). You may have this done every 1-3 years. Bone density scan. This is done to screen for osteoporosis. You may have  this done starting at age 49. Mammogram. This may be done every 1-2 years. Talk to your health care provider about how often you should have regular mammograms. Talk with your health care provider about your test results, treatment options, and if necessary, the need for more tests. Vaccines  Your health care provider may recommend certain vaccines, such as: Influenza vaccine. This is recommended every year. Tetanus,  diphtheria, and acellular pertussis (Tdap, Td) vaccine. You may need a Td booster every 10 years. Zoster vaccine. You may need this after age 34. Pneumococcal 13-valent conjugate (PCV13) vaccine. One dose is recommended after age 76. Pneumococcal polysaccharide (PPSV23) vaccine. One dose is recommended after age 5. Talk to your health care provider about which screenings and vaccines you need and how often you need them. This information is not intended to replace advice given to you by your health care provider. Make sure you discuss any questions you have with your health care provider. Document Released: 03/17/2015 Document Revised: 11/08/2015 Document Reviewed: 12/20/2014 Elsevier Interactive Patient Education  2017 [place] Prevention in the Home Falls can cause injuries. They can happen to people of all ages. There are many things you can do to make your home safe and to help prevent falls. What can I do on the outside of my home? Regularly fix the edges of walkways and driveways and fix any cracks. Remove anything that might make you trip as you walk through a door, such as a raised step or threshold. Trim any bushes or trees on the path to your home. Use bright outdoor lighting. Clear any walking paths of anything that might make someone trip, such as rocks or tools. Regularly check to see if handrails are loose or broken. Make sure that both sides of any steps have handrails. Any raised decks and porches should have guardrails on the edges. Have any leaves, snow, or ice cleared regularly. Use sand or salt on walking paths during winter. Clean up any spills in your garage right away. This includes oil or grease spills. What can I do in the bathroom? Use night lights. Install grab bars by the toilet and in the tub and shower. Do not use towel bars as grab bars. Use non-skid mats or decals in the tub or shower. If you need to sit down in the shower, use a plastic,  non-slip stool. Keep the floor dry. Clean up any water that spills on the floor as soon as it happens. Remove soap buildup in the tub or shower regularly. Attach bath mats securely with double-sided non-slip rug tape. Do not have throw rugs and other things on the floor that can make you trip. What can I do in the bedroom? Use night lights. Make sure that you have a light by your bed that is easy to reach. Do not use any sheets or blankets that are too big for your bed. They should not hang down onto the floor. Have a firm chair that has side arms. You can use this for support while you get dressed. Do not have throw rugs and other things on the floor that can make you trip. What can I do in the kitchen? Clean up any spills right away. Avoid walking on wet floors. Keep items that you use a lot in easy-to-reach places. If you need to reach something above you, use a strong step stool that has a grab bar. Keep electrical cords out of the way. Do not use floor polish or  wax that makes floors slippery. If you must use wax, use non-skid floor wax. Do not have throw rugs and other things on the floor that can make you trip. What can I do with my stairs? Do not leave any items on the stairs. Make sure that there are handrails on both sides of the stairs and use them. Fix handrails that are broken or loose. Make sure that handrails are as long as the stairways. Check any carpeting to make sure that it is firmly attached to the stairs. Fix any carpet that is loose or worn. Avoid having throw rugs at the top or bottom of the stairs. If you do have throw rugs, attach them to the floor with carpet tape. Make sure that you have a light switch at the top of the stairs and the bottom of the stairs. If you do not have them, ask someone to add them for you. What else can I do to help prevent falls? Wear shoes that: Do not have high heels. Have rubber bottoms. Are comfortable and fit you well. Are closed  at the toe. Do not wear sandals. If you use a stepladder: Make sure that it is fully opened. Do not climb a closed stepladder. Make sure that both sides of the stepladder are locked into place. Ask someone to hold it for you, if possible. Clearly mark and make sure that you can see: Any grab bars or handrails. First and last steps. Where the edge of each step is. Use tools that help you move around (mobility aids) if they are needed. These include: Canes. Walkers. Scooters. Crutches. Turn on the lights when you go into a dark area. Replace any light bulbs as soon as they burn out. Set up your furniture so you have a clear path. Avoid moving your furniture around. If any of your floors are uneven, fix them. If there are any pets around you, be aware of where they are. Review your medicines with your doctor. Some medicines can make you feel dizzy. This can increase your chance of falling. Ask your doctor what other things that you can do to help prevent falls. This information is not intended to replace advice given to you by your health care provider. Make sure you discuss any questions you have with your health care provider. Document Released: 12/15/2008 Document Revised: 07/27/2015 Document Reviewed: 03/25/2014 Elsevier Interactive Patient Education  2017 [employer].

## 2022-04-10 NOTE — Progress Notes (Signed)
Virtual Visit via Telephone Note  I connected with  Kimberly Andrade on 04/10/22 at  9:30 AM EST by telephone and verified that I am speaking with the correct person using two identifiers.  Location: Patient: home Provider: [facility] Persons participating in the virtual visit: [place]   I discussed the limitations, risks, security and privacy concerns of performing an evaluation and management service by telephone and the availability of in person appointments. The patient expressed understanding and agreed to proceed.  Interactive audio and video telecommunications were attempted between this nurse and patient, however failed, due to patient having technical difficulties OR patient did not have access to video capability.  We continued and completed visit with audio only.  Some vital signs may be absent or patient reported.   Dionisio David, LPN  Subjective:   Kimberly Andrade is a 77 y.o. female who presents for Medicare Annual (Subsequent) preventive examination.  Review of Systems     Cardiac Risk Factors include: advanced age (>89mn, >>46women);hypertension;dyslipidemia     Objective:    Today's Vitals   04/10/22 0941  Weight: 177 lb (80.3 kg)  Height: '5\' 1"'$  (1.549 m)   Body mass index is 33.44 kg/m.     04/10/2022    9:32 AM 04/19/2021   12:01 AM 04/04/2021   10:08 AM 04/24/2020    4:44 PM 04/18/2020    5:40 AM 04/13/2020    8:39 AM 04/03/2020   10:13 AM  Advanced Directives  Does Patient Have a Medical Advance Directive? No No No No No No No  Would patient like information on creating a medical advance directive? No - Patient declined No - Patient declined Yes (MAU/Ambulatory/Procedural Areas - Information given)  No - Patient declined  No - Patient declined    Current Medications (verified) Outpatient Encounter Medications as of 04/10/2022  Medication Sig   albuterol (VENTOLIN HFA) 108 (90 Base) MCG/ACT inhaler SMARTSIG:2 Puff(s) By Mouth  Every 6 Hours PRN   Ascorbic Acid (VITAMIN C PO) Take 500 mg by mouth daily.   aspirin EC 81 MG tablet Take 1 tablet (81 mg total) by mouth 2 (two) times daily after a meal.   Calcium Carbonate-Vit D-Min (CALCIUM 600+D3 PLUS MINERALS) 600-800 MG-UNIT TABS Take 1 tablet by mouth daily.   cholecalciferol (VITAMIN D) 25 MCG (1000 UNIT) tablet Take 1,000 Units by mouth daily.   gabapentin (NEURONTIN) 300 MG capsule Take 2 capsules (600 mg total) by mouth 2 (two) times daily.   isosorbide mononitrate (IMDUR) 30 MG 24 hr tablet Take 30 mg by mouth daily.   losartan (COZAAR) 50 MG tablet Take 1 tablet (50 mg total) by mouth daily.   Magnesium 250 MG TABS Take 250 mg by mouth daily.    meclizine (ANTIVERT) 25 MG tablet Take 1 tablet (25 mg total) by mouth 3 (three) times daily as needed for dizziness.   mesalamine (PENTASA) 500 MG CR capsule Take 2,000 mg by mouth 2 (two) times daily as needed (Crohn's).   Multiple Vitamins-Minerals (MULTIVITAMIN WITH MINERALS) tablet Take 1 tablet by mouth daily.   NON FORMULARY BiPap nightly   oxybutynin (DITROPAN XL) 15 MG 24 hr tablet Take 1 tablet (15 mg total) by mouth at bedtime.   pantoprazole (PROTONIX) 40 MG tablet Take 40 mg by mouth every morning.   Potassium 99 MG TABS Take 99 mg by mouth daily.    pravastatin (PRAVACHOL) 40 MG tablet Take 1 tablet (40 mg total) by mouth daily.  prednisoLONE acetate (PRED FORTE) 1 % ophthalmic suspension Place 1 drop into the right eye 2 (two) times daily.   PROLIA 60 MG/ML SOSY injection Inject 60 mg into the skin every 6 (six) months.   promethazine-dextromethorphan (PROMETHAZINE-DM) 6.25-15 MG/5ML syrup Take 5 mLs by mouth 4 (four) times daily as needed for up to 9 days for cough.   vitamin B-12 (CYANOCOBALAMIN) 1000 MCG tablet Take 1,000 mcg by mouth daily.   zinc gluconate 50 MG tablet Take 50 mg by mouth daily.   meloxicam (MOBIC) 15 MG tablet Take 15 mg by mouth daily. (Patient not taking: Reported on 04/10/2022)    No facility-administered encounter medications on file as of 04/10/2022.    Allergies (verified) Fosamax [alendronate sodium], Lipitor [atorvastatin], Codeine, and Hydrocodone   History: Past Medical History:  Diagnosis Date   Arthritis    B12 deficiency    Bronchiectasis [facility])    pulmonology   Crohn disease ([place])    Crohn's disease ([place])    Diverticulosis    DJD (degenerative joint disease)    DJD (degenerative joint disease)    Essential hypertension 08/16/2019   Full dentures    GERD (gastroesophageal reflux disease)    pt deneis    Heart palpitations    Hypercholesterolemia    Hyperlipidemia    Incontinence of urine    Nocturnal leg cramps    Osteopenia    Osteoporosis    Reflux    Sleep apnea    uses a cpap   Wears glasses    Past Surgical History:  Procedure Laterality Date   ABDOMINAL HYSTERECTOMY     APPENDECTOMY     CARPEL TUNNEL     CHOLECYSTECTOMY     COLONOSCOPY WITH PROPOFOL N/A 03/11/2016   Procedure: COLONOSCOPY WITH PROPOFOL;  Surgeon: Lollie Sails, MD;  Location: [facility] ENDOSCOPY;  Service: Endoscopy;  Laterality: N/A;   DILATION AND CURETTAGE OF UTERUS     ESOPHAGOGASTRODUODENOSCOPY N/A 03/11/2016   Procedure: ESOPHAGOGASTRODUODENOSCOPY (EGD);  Surgeon: Lollie Sails, MD;  Location: [facility] ENDOSCOPY;  Service: Endoscopy;  Laterality: N/A;   ESOPHAGOGASTRODUODENOSCOPY (EGD) WITH PROPOFOL N/A 11/11/2017   Procedure: ESOPHAGOGASTRODUODENOSCOPY (EGD) WITH PROPOFOL;  Surgeon: Lollie Sails, MD;  Location: [facility] ENDOSCOPY;  Service: Endoscopy;  Laterality: N/A;   ESOPHAGOGASTRODUODENOSCOPY (EGD) WITH PROPOFOL N/A 01/20/2018   Procedure: ESOPHAGOGASTRODUODENOSCOPY (EGD) WITH PROPOFOL;  Surgeon: Lollie Sails, MD;  Location: [facility] ENDOSCOPY;  Service: Endoscopy;  Laterality: N/A;   FOOT SURGERY Right    HAMMER TOE SURGERY     JOINT REPLACEMENT Right 2006   shoulder   KNEE ARTHROSCOPY Right 05/25/2013   Procedure: ARTHROSCOPY KNEE;  Surgeon:  Hessie Dibble, MD;  Location: [place];  Service: Orthopedics;  Laterality: Right;  medial and lateral meniscal debridment and chondroplasty   righti shoulder replacement      ROBOTIC ASSISTED LAPAROSCOPIC REPAIR OF PARAESOPHAGEAL HERNIA  07/16/2018   SHOULDER ARTHROSCOPY WITH ROTATOR CUFF REPAIR AND SUBACROMIAL DECOMPRESSION Left 08/14/2017   Procedure: SHOULDER ARTHROSCOPY WITH ROTATOR CUFF REPAIR AND SUBACROMIAL DECOMPRESSION;  Surgeon: Tania Ade, MD;  Location: [place];  Service: Orthopedics;  Laterality: Left;   SHOULDER SURGERY Right    X 3   TOTAL HIP ARTHROPLASTY Left 11/25/2017   Procedure: LEFT TOTAL HIP ARTHROPLASTY ANTERIOR APPROACH;  Surgeon: Melrose Nakayama, MD;  Location: [place];  Service: Orthopedics;  Laterality: Left;   TOTAL KNEE ARTHROPLASTY Right 04/18/2020   Procedure: RIGHT TOTAL KNEE ARTHROPLASTY;  Surgeon: Melrose Nakayama, MD;  Location:  [facility];  Service: Orthopedics;  Laterality: Right;   Family History  Problem Relation Age of Onset   Heart disease Mother    Esophageal cancer Father    Social History   Socioeconomic History   Marital status: Married    Spouse name: Not on file   Number of children: 3   Years of education: Not on file   Highest education level: Associate degree: academic program  Occupational History   Occupation: retired  Tobacco Use   Smoking status: Former    Types: Cigarettes    Quit date: 12/29/2001    Years since quitting: 20.2   Smokeless tobacco: Never  Vaping Use   Vaping Use: Never used  Substance and Sexual Activity   Alcohol use: No   Drug use: No   Sexual activity: Not on file  Other Topics Concern   Not on file  Social History Narrative   Not on file   Social Determinants of Health   Financial Resource Strain: Low Risk  (04/10/2022)   Overall Financial Resource Strain (CARDIA)    Difficulty of Paying Living Expenses: Not hard at all  Food Insecurity: No Food Insecurity (04/10/2022)   Hunger  Vital Sign    Worried About Running Out of Food in the Last Year: Never true    [place] in the Last Year: Never true  Transportation Needs: No Transportation Needs (04/10/2022)   PRAPARE - Hydrologist (Medical): No    Lack of Transportation (Non-Medical): No  Physical Activity: Insufficiently Active (04/10/2022)   Exercise Vital Sign    Days of Exercise per Week: 3 days    Minutes of Exercise per Session: 30 min  Stress: No Stress Concern Present (04/10/2022)   [place]    Feeling of Stress : Not at all  Social Connections: Moderately Integrated (04/10/2022)   Social Connection and Isolation Panel [NHANES]    Frequency of Communication with Friends and Family: More than three times a week    Frequency of Social Gatherings with Friends and Family: More than three times a week    Attends Religious Services: More than 4 times per year    Active Member of [employer] or Organizations: No    Attends Music therapist: Never    Marital Status: Married    Tobacco Counseling Counseling given: Not Answered   Clinical Intake:  Pre-visit preparation completed: Yes  Pain : No/denies pain     Nutritional Risks: None Diabetes: No  How often do you need to have someone help you when you read instructions, pamphlets, or other written materials from your doctor or pharmacy?: 1 - Never  Diabetic?no  Interpreter Needed?: No  Information entered by :: Kirke Shaggy, LPN   Activities of Daily Living    04/10/2022    9:33 AM  In your present state of health, do you have any difficulty performing the following activities:  Hearing? 0  Vision? 0  Difficulty concentrating or making decisions? 0  Walking or climbing stairs? 1  Dressing or bathing? 0  Doing errands, shopping? 0  Preparing Food and eating ? N  Using the Toilet? N  In the past six months, have you accidently leaked  urine? N  Do you have problems with loss of bowel control? N  Managing your Medications? N  Managing your Finances? N  Housekeeping or managing your Housekeeping? N    Patient Care Team:  Glean Hess, MD as PCP - General (Internal Medicine) Corey Skains, MD as Consulting Physician (Cardiology) Erby Pian, MD as Referring Physician (Pulmonary Disease) Gabriel Carina Betsey Holiday, MD as Physician Assistant (Endocrinology) Melrose Nakayama, MD as Consulting Physician (Orthopedic Surgery) Adron Bene (Gastroenterology) Brendolyn Patty, MD (Dermatology) Delana Meyer, Dolores Lory, MD (Vascular Surgery)  Indicate any recent Medical Services you may have received from other than [facility] providers in the past year (date may be approximate).     Assessment:   This is a routine wellness examination for [place].  Hearing/Vision screen Hearing Screening - Comments:: No aids Vision Screening - Comments:: Wears glasses- [facility]   Dietary issues and exercise activities discussed: Current Exercise Habits: Home exercise routine, Type of exercise: walking, Time (Minutes): 30, Frequency (Times/Week): 3, Weekly Exercise (Minutes/Week): 90, Intensity: Mild   Goals Addressed             This Visit's Progress    DIET - EAT MORE FRUITS AND VEGETABLES         Depression Screen    04/10/2022    9:31 AM 02/13/2022    8:51 AM 09/06/2021    9:45 AM 04/04/2021   10:06 AM 03/20/2021   11:08 AM 03/09/2021    9:27 AM 02/19/2021    9:19 AM  PHQ 2/9 Scores  PHQ - 2 Score 0 0 1 0 0 0 1  PHQ- 9 Score 0 '3 4 1 3 '$ 0 4    Fall Risk    04/10/2022    9:33 AM 02/13/2022    8:52 AM 09/06/2021    9:46 AM 04/04/2021   10:09 AM 03/20/2021   11:09 AM  Fall Risk   Falls in the past year? 0 0 0 0 0  Number falls in past yr: 0 0 0 0 0  Injury with Fall? 0 0 0 0 0  Risk for fall due to : No Fall Risks No Fall Risks No Fall Risks No Fall Risks No Fall Risks  Follow up Falls prevention discussed;Falls evaluation  completed Falls evaluation completed Falls evaluation completed Falls prevention discussed Falls evaluation completed    FALL RISK PREVENTION PERTAINING TO THE HOME:  Any stairs in or around the home? Yes  If so, are there any without handrails? No  Home free of loose throw rugs in walkways, pet beds, electrical cords, etc? Yes  Adequate lighting in your home to reduce risk of falls? Yes   ASSISTIVE DEVICES UTILIZED TO PREVENT FALLS:  Life alert? No  Use of a cane, walker or w/c? Yes  Grab bars in the bathroom? Yes  Shower chair or bench in shower? Yes  Elevated toilet seat or a handicapped toilet? Yes   Cognitive Function:        04/10/2022    9:33 AM 03/10/2019   10:55 AM  6CIT Screen  What Year? 0 points 0 points  What month? 0 points 0 points  What time? 0 points 0 points  Count back from 20 0 points 0 points  Months in reverse 0 points 0 points  Repeat phrase 0 points 0 points  Total Score 0 points 0 points    Immunizations Immunization History  Administered Date(s) Administered   Fluad Quad(high Dose 65+) 12/11/2018, 02/16/2020, 02/19/2021, 02/13/2022   Influenza, High Dose Seasonal PF 11/27/2017   Influenza, Seasonal, Injecte, Preservative Fre 11/27/2017   PNEUMOCOCCAL CONJUGATE-20 02/13/2022   Pneumococcal Conjugate-13 06/29/2015   Pneumococcal Polysaccharide-23 04/29/2011   Tdap 06/29/2015, 07/10/2016  Zoster Recombinat (Shingrix) 10/19/2020, 01/19/2021   Zoster, Live 02/01/2014    TDAP status: Up to date  Flu Vaccine status: Up to date  Pneumococcal vaccine status: Up to date  Covid-19 vaccine status: Declined, Education has been provided regarding the importance of this vaccine but patient still declined. Advised may receive this vaccine at local pharmacy or Health Dept.or vaccine clinic. Aware to provide a copy of the vaccination record if obtained from local pharmacy or Health Dept. Verbalized acceptance and understanding.  Qualifies for Shingles  Vaccine? Yes   Zostavax completed Yes   Shingrix Completed?: Yes  Screening Tests Health Maintenance  Topic Date Due   COLONOSCOPY (Pts 45-3yr Insurance coverage will need to be confirmed)  06/15/2022   MAMMOGRAM  09/28/2022   Medicare Annual Wellness (AWV)  04/11/2023   DTaP/Tdap/Td (3 - Td or Tdap) 07/11/2026   Pneumonia Vaccine 77 Years old  Completed   INFLUENZA VACCINE  Completed   DEXA SCAN  Completed   Hepatitis C Screening  Completed   Zoster Vaccines- Shingrix  Completed   HPV VACCINES  Aged Out   COVID-19 Vaccine  Discontinued    Health Maintenance  There are no preventive care reminders to display for this patient.   Colorectal cancer screening: Type of screening: Colonoscopy. Completed 06/15/19. Repeat every 3 years  Mammogram status: Completed 09/27/21. Repeat every year- ordered 02/13/22  Bone Density status: Completed 09/27/20. Results reflect: Bone density results: OSTEOPOROSIS. Repeat every 2 years.  Lung Cancer Screening: (Low Dose CT Chest recommended if Age 77-80years, 30 pack-year currently smoking OR have quit w/in 15years.) does not qualify.    Additional Screening:  Hepatitis C Screening: does qualify; Completed 08/06/16  Vision Screening: Recommended annual ophthalmology exams for early detection of glaucoma and other disorders of the eye. Is the patient up to date with their annual eye exam?  Yes  Who is the provider or what is the name of the office in which the patient attends annual eye exams? [place] is not established with a provider, would they like to be referred to a provider to establish care? No .   Dental Screening: Recommended annual dental exams for proper oral hygiene  Community Resource Referral / Chronic Care Management: CRR required this visit?  No   CCM required this visit?  No      Plan:     I have personally reviewed and noted the following in the patient's chart:   Medical and social history Use of  alcohol, tobacco or illicit drugs  Current medications and supplements including opioid prescriptions. Patient is not currently taking opioid prescriptions. Functional ability and status Nutritional status Physical activity Advanced directives List of other physicians Hospitalizations, surgeries, and ER visits in previous 12 months Vitals Screenings to include cognitive, depression, and falls Referrals and appointments  In addition, I have reviewed and discussed with patient certain preventive protocols, quality metrics, and best practice recommendations. A written personalized care plan for preventive services as well as general preventive health recommendations were provided to patient.     LDionisio David LPN   27/03/6965  Nurse Notes: none

## 2022-05-21 ENCOUNTER — Encounter: Payer: Self-pay | Admitting: Ophthalmology

## 2022-05-23 NOTE — Discharge Instructions (Signed)

## 2022-05-28 ENCOUNTER — Ambulatory Visit: Payer: Medicare Other | Admitting: Anesthesiology

## 2022-05-28 ENCOUNTER — Other Ambulatory Visit: Payer: Self-pay

## 2022-05-28 ENCOUNTER — Encounter: Payer: Self-pay | Admitting: Ophthalmology

## 2022-05-28 ENCOUNTER — Ambulatory Visit
Admission: RE | Admit: 2022-05-28 | Discharge: 2022-05-28 | Disposition: A | Discharge status: Home / Self Care | Payer: Medicare Other | Attending: Ophthalmology | Admitting: Ophthalmology

## 2022-05-28 ENCOUNTER — Encounter
Admission: RE | Disposition: A | Discharge status: Home / Self Care | Payer: Self-pay | Source: Home / Self Care | Attending: Ophthalmology

## 2022-05-28 DIAGNOSIS — E7800 Pure hypercholesterolemia, unspecified: Secondary | ICD-10-CM | POA: Insufficient documentation

## 2022-05-28 DIAGNOSIS — I10 Essential (primary) hypertension: Secondary | ICD-10-CM | POA: Insufficient documentation

## 2022-05-28 DIAGNOSIS — K219 Gastro-esophageal reflux disease without esophagitis: Secondary | ICD-10-CM | POA: Insufficient documentation

## 2022-05-28 DIAGNOSIS — H2511 Age-related nuclear cataract, right eye: Secondary | ICD-10-CM | POA: Insufficient documentation

## 2022-05-28 DIAGNOSIS — Z87891 Personal history of nicotine dependence: Secondary | ICD-10-CM | POA: Insufficient documentation

## 2022-05-28 DIAGNOSIS — G4730 Sleep apnea, unspecified: Secondary | ICD-10-CM | POA: Diagnosis not present

## 2022-05-28 HISTORY — PX: CATARACT EXTRACTION W/PHACO: SHX586

## 2022-05-28 HISTORY — DX: Dizziness and giddiness: R42

## 2022-05-28 SURGERY — PHACOEMULSIFICATION, CATARACT, WITH IOL INSERTION
Anesthesia: Monitor Anesthesia Care | Site: Eye | Laterality: Right

## 2022-05-28 MED ORDER — MIDAZOLAM HCL 2 MG/2ML IJ SOLN
INTRAMUSCULAR | Status: DC | PRN
  Administered 2022-05-28: 2 mg via INTRAVENOUS

## 2022-05-28 MED ORDER — SIGHTPATH DOSE#1 BSS IO SOLN
INTRAOCULAR | Status: DC | PRN
  Administered 2022-05-28: 15 mL

## 2022-05-28 MED ORDER — SIGHTPATH DOSE#1 BSS IO SOLN
INTRAOCULAR | Status: DC | PRN
  Administered 2022-05-28: 1 mL via INTRAMUSCULAR

## 2022-05-28 MED ORDER — ARMC OPHTHALMIC DILATING DROPS
1.0000 | OPHTHALMIC | Status: DC | PRN
  Administered 2022-05-28 (×3): 1 via OPHTHALMIC

## 2022-05-28 MED ORDER — TETRACAINE HCL 0.5 % OP SOLN
1.0000 [drp] | OPHTHALMIC | Status: DC | PRN
  Administered 2022-05-28 (×3): 1 [drp] via OPHTHALMIC

## 2022-05-28 MED ORDER — LACTATED RINGERS IV SOLN: INTRAVENOUS | Status: DC

## 2022-05-28 MED ORDER — MOXIFLOXACIN HCL 0.5 % OP SOLN
OPHTHALMIC | Status: DC | PRN
  Administered 2022-05-28: .2 mL via OPHTHALMIC

## 2022-05-28 MED ORDER — SIGHTPATH DOSE#1 NA CHONDROIT SULF-NA HYALURON 40-17 MG/ML IO SOLN
INTRAOCULAR | Status: DC | PRN
  Administered 2022-05-28: 1 mL via INTRAOCULAR

## 2022-05-28 MED ORDER — BRIMONIDINE TARTRATE-TIMOLOL 0.2-0.5 % OP SOLN
OPHTHALMIC | Status: DC | PRN
  Administered 2022-05-28: 1 [drp] via OPHTHALMIC

## 2022-05-28 MED ORDER — FENTANYL CITRATE (PF) 100 MCG/2ML IJ SOLN
INTRAMUSCULAR | Status: DC | PRN
  Administered 2022-05-28: 100 ug via INTRAVENOUS

## 2022-05-28 MED ORDER — SIGHTPATH DOSE#1 BSS IO SOLN
INTRAOCULAR | Status: DC | PRN
  Administered 2022-05-28: 47 mL via OPHTHALMIC

## 2022-05-28 SURGICAL SUPPLY — 9 items
CATARACT SUITE SIGHTPATH (MISCELLANEOUS) ×1 IMPLANT
FEE CATARACT SUITE SIGHTPATH (MISCELLANEOUS) ×1 IMPLANT
GLOVE BIOGEL PI IND STRL 8 (GLOVE) ×1 IMPLANT
GLOVE SURG ENC TEXT LTX SZ8 (GLOVE) ×1 IMPLANT
LENS IOL TECNIS EYHANCE 20.5 (Intraocular Lens) IMPLANT
NDL FILTER BLUNT 18X1 1/2 (NEEDLE) ×1 IMPLANT
NEEDLE FILTER BLUNT 18X1 1/2 (NEEDLE) ×1 IMPLANT
SYR 3ML LL SCALE MARK (SYRINGE) ×1 IMPLANT
WATER STERILE IRR 250ML POUR (IV SOLUTION) ×1 IMPLANT

## 2022-05-28 NOTE — Anesthesia Preprocedure Evaluation (Signed)
Anesthesia Evaluation  Patient identified by MRN, date of birth, ID band Patient awake    Reviewed: Allergy & Precautions, NPO status , Patient's Chart, lab work & pertinent test results  History of Anesthesia Complications Negative for: history of anesthetic complications  Airway Mallampati: II  TM Distance: >3 FB Neck ROM: Full    Dental  (+) Edentulous Lower, Upper Dentures Upper dentures firmly bonded in place:   Pulmonary sleep apnea and Continuous Positive Airway Pressure Ventilation , neg COPD, Patient abstained from smoking.Not current smoker, former smoker   Pulmonary exam normal breath sounds clear to auscultation       Cardiovascular Exercise Tolerance: Good METShypertension, Pt. on medications + angina  (-) CAD and (-) Past MI Normal cardiovascular exam(-) dysrhythmias  Rhythm:Regular Rate:Normal - Systolic murmurs    Neuro/Psych negative neurological ROS  negative psych ROS   GI/Hepatic Neg liver ROS,GERD  Medicated and Controlled,,Crohn disease    Endo/Other  negative endocrine ROSneg diabetes    Renal/GU negative Renal ROS     Musculoskeletal  (+) Arthritis ,    Abdominal   Peds  Hematology HLD   Anesthesia Other Findings Past Medical History: No date: Arthritis No date: B12 deficiency No date: Bronchiectasis [facility])     Comment:  pulmonology No date: Crohn's disease ([place]) No date: Diverticulosis No date: DJD (degenerative joint disease) 08/16/2019: Essential hypertension No date: Full dentures No date: GERD (gastroesophageal reflux disease)     Comment:  pt deneis  No date: Heart palpitations No date: Hypercholesterolemia No date: Hyperlipidemia No date: Incontinence of urine No date: Nocturnal leg cramps No date: Osteopenia No date: Osteoporosis No date: Reflux No date: Sleep apnea     Comment:  uses a cpap No date: Vertigo     Comment:  last episode 07/2021 No date: Wears glasses   Reproductive/Obstetrics                              Anesthesia Physical Anesthesia Plan  ASA: 2  Anesthesia Plan: MAC   Post-op Pain Management:    Induction: Intravenous  PONV Risk Score and Plan: 2 and Midazolam  Airway Management Planned: Nasal Cannula  Additional Equipment:   Intra-op Plan:   Post-operative Plan:   Informed Consent: I have reviewed the patients History and Physical, chart, labs and discussed the procedure including the risks, benefits and alternatives for the proposed anesthesia with the patient or authorized representative who has indicated his/her understanding and acceptance.       Plan Discussed with: CRNA and Surgeon  Anesthesia Plan Comments: (Explained risks of anesthesia, including PONV, and rare emergencies such as cardiac events, respiratory problems, and allergic reactions, requiring invasive intervention. Discussed the role of CRNA in patient's perioperative care. Patient understands. )         Anesthesia Quick Evaluation

## 2022-05-28 NOTE — H&P (Signed)
[facility]   Primary Care Physician:  Glean Hess, MD Ophthalmologist: Dr. George Ina  Pre-Procedure History & Physical: HPI:  Kimberly Andrade is a 77 y.o. female here for cataract surgery.   Past Medical History:  Diagnosis Date   Arthritis    B12 deficiency    Bronchiectasis [facility])    pulmonology   Crohn's disease [facility])    Diverticulosis    DJD (degenerative joint disease)    Essential hypertension 08/16/2019   Full dentures    GERD (gastroesophageal reflux disease)    pt deneis    Heart palpitations    Hypercholesterolemia    Hyperlipidemia    Incontinence of urine    Nocturnal leg cramps    Osteopenia    Osteoporosis    Reflux    Sleep apnea    uses a cpap   Vertigo    last episode 07/2021   Wears glasses     Past Surgical History:  Procedure Laterality Date   ABDOMINAL HYSTERECTOMY     APPENDECTOMY     CARPEL TUNNEL     CHOLECYSTECTOMY     COLONOSCOPY WITH PROPOFOL N/A 03/11/2016   Procedure: COLONOSCOPY WITH PROPOFOL;  Surgeon: Lollie Sails, MD;  Location: [facility] ENDOSCOPY;  Service: Endoscopy;  Laterality: N/A;   DILATION AND CURETTAGE OF UTERUS     ESOPHAGOGASTRODUODENOSCOPY N/A 03/11/2016   Procedure: ESOPHAGOGASTRODUODENOSCOPY (EGD);  Surgeon: Lollie Sails, MD;  Location: [facility] ENDOSCOPY;  Service: Endoscopy;  Laterality: N/A;   ESOPHAGOGASTRODUODENOSCOPY (EGD) WITH PROPOFOL N/A 11/11/2017   Procedure: ESOPHAGOGASTRODUODENOSCOPY (EGD) WITH PROPOFOL;  Surgeon: Lollie Sails, MD;  Location: [facility] ENDOSCOPY;  Service: Endoscopy;  Laterality: N/A;   ESOPHAGOGASTRODUODENOSCOPY (EGD) WITH PROPOFOL N/A 01/20/2018   Procedure: ESOPHAGOGASTRODUODENOSCOPY (EGD) WITH PROPOFOL;  Surgeon: Lollie Sails, MD;  Location: [facility] ENDOSCOPY;  Service: Endoscopy;  Laterality: N/A;   FOOT SURGERY Right    HAMMER TOE SURGERY     JOINT REPLACEMENT Right 2006   shoulder   KNEE ARTHROSCOPY Right 05/25/2013   Procedure: ARTHROSCOPY KNEE;  Surgeon:  Hessie Dibble, MD;  Location: [place];  Service: Orthopedics;  Laterality: Right;  medial and lateral meniscal debridment and chondroplasty   righti shoulder replacement      ROBOTIC ASSISTED LAPAROSCOPIC REPAIR OF PARAESOPHAGEAL HERNIA  07/16/2018   SHOULDER ARTHROSCOPY WITH ROTATOR CUFF REPAIR AND SUBACROMIAL DECOMPRESSION Left 08/14/2017   Procedure: SHOULDER ARTHROSCOPY WITH ROTATOR CUFF REPAIR AND SUBACROMIAL DECOMPRESSION;  Surgeon: Tania Ade, MD;  Location: [place];  Service: Orthopedics;  Laterality: Left;   SHOULDER SURGERY Right    X 3   TOTAL HIP ARTHROPLASTY Left 11/25/2017   Procedure: LEFT TOTAL HIP ARTHROPLASTY ANTERIOR APPROACH;  Surgeon: Melrose Nakayama, MD;  Location: [place];  Service: Orthopedics;  Laterality: Left;   TOTAL KNEE ARTHROPLASTY Right 04/18/2020   Procedure: RIGHT TOTAL KNEE ARTHROPLASTY;  Surgeon: Melrose Nakayama, MD;  Location: [facility];  Service: Orthopedics;  Laterality: Right;    Prior to Admission medications   Medication Sig Start Date End Date Taking? Authorizing Provider  albuterol (VENTOLIN HFA) 108 (90 Base) MCG/ACT inhaler SMARTSIG:2 Puff(s) By Mouth Every 6 Hours PRN 11/08/20  Yes [provider]  Ascorbic Acid (VITAMIN C PO) Take 500 mg by mouth daily.   Yes [provider]  aspirin EC 81 MG tablet Take 1 tablet (81 mg total) by mouth 2 (two) times daily after a meal. 04/18/20  Yes Nida, Mitzi Hansen, PA-C  Calcium Carbonate-Vit D-Min (CALCIUM 600+D3 PLUS  MINERALS) 600-800 MG-UNIT TABS Take 1 tablet by mouth daily.   Yes [provider]  cholecalciferol (VITAMIN D) 25 MCG (1000 UNIT) tablet Take 1,000 Units by mouth daily.   Yes [provider]  ferrous sulfate 324 MG TBEC Take 324 mg by mouth daily with breakfast.   Yes [provider]  isosorbide mononitrate (IMDUR) 30 MG 24 hr tablet Take 30 mg by mouth daily. 05/03/21  Yes [provider]  losartan (COZAAR) 50 MG tablet Take 1 tablet  (50 mg total) by mouth daily. 03/11/22  Yes Glean Hess, MD  Magnesium 250 MG TABS Take 500 mg by mouth daily.   Yes [provider]  mesalamine (PENTASA) 500 MG CR capsule Take 2,000 mg by mouth 2 (two) times daily as needed (Crohn's).   Yes [provider]  Multiple Vitamins-Minerals (MULTIVITAMIN WITH MINERALS) tablet Take 1 tablet by mouth daily.   Yes [provider]  NON FORMULARY BiPap nightly   Yes [provider]  Omega-3 Fatty Acids (FISH OIL PO) Take 1,200 mg by mouth at bedtime.   Yes [provider]  oxybutynin (DITROPAN XL) 15 MG 24 hr tablet Take 1 tablet (15 mg total) by mouth at bedtime. 03/11/22  Yes Glean Hess, MD  pantoprazole (PROTONIX) 40 MG tablet Take 40 mg by mouth every morning. 06/04/21  Yes [provider]  Potassium 99 MG TABS Take 99 mg by mouth daily.    Yes [provider]  pravastatin (PRAVACHOL) 40 MG tablet Take 1 tablet (40 mg total) by mouth daily. 03/11/22  Yes Glean Hess, MD  prednisoLONE acetate (PRED FORTE) 1 % ophthalmic suspension Place 1 drop into the right eye 2 (two) times daily. 11/19/21  Yes [provider]  pyridOXINE (VITAMIN B6) 100 MG tablet Take 100 mg by mouth daily.   Yes [provider]  vitamin B-12 (CYANOCOBALAMIN) 1000 MCG tablet Take 1,000 mcg by mouth daily.   Yes [provider]  zinc gluconate 50 MG tablet Take 50 mg by mouth daily.   Yes [provider]  gabapentin (NEURONTIN) 300 MG capsule Take 2 capsules (600 mg total) by mouth 2 (two) times daily. Patient not taking: Reported on 05/21/2022 02/13/22   Glean Hess, MD  meclizine (ANTIVERT) 25 MG tablet Take 1 tablet (25 mg total) by mouth 3 (three) times daily as needed for dizziness. Patient not taking: Reported on 05/21/2022 07/05/21   Leath-Warren, Alda Lea, NP  meloxicam (MOBIC) 15 MG tablet Take 15 mg by mouth daily. Patient not taking: Reported on 04/10/2022 01/14/22    [provider]  PROLIA 60 MG/ML SOSY injection Inject 60 mg into the skin every 6 (six) months. 03/19/19   [provider]    Allergies as of 05/06/2022 - Review Complete 04/10/2022  Allergen Reaction Noted   Fosamax [alendronate sodium] Other (See Comments) 05/24/2013   Lipitor [atorvastatin] Other (See Comments) 05/24/2013   Codeine Other (See Comments) 12/28/2012   Hydrocodone Nausea And Vomiting 05/24/2013    Family History  Problem Relation Age of Onset   Heart disease Mother    Esophageal cancer Father     Social History   Socioeconomic History   Marital status: Married    Spouse name: Not on file   Number of children: 3   Years of education: Not on file   Highest education level: Associate degree: academic program  Occupational History   Occupation: retired  Tobacco Use   Smoking  status: Former    Types: Cigarettes    Quit date: 12/29/2001    Years since quitting: 20.4   Smokeless tobacco: Never  Vaping Use   Vaping Use: Never used  Substance and Sexual Activity   Alcohol use: No   Drug use: No   Sexual activity: Not on file  Other Topics Concern   Not on file  Social History Narrative   Not on file   Social Determinants of Health   Financial Resource Strain: Low Risk  (04/10/2022)   Overall Financial Resource Strain (CARDIA)    Difficulty of Paying Living Expenses: Not hard at all  Food Insecurity: No Food Insecurity (04/10/2022)   Hunger Vital Sign    Worried About Running Out of Food in the Last Year: Never true    Ran Out of Food in the Last Year: Never true  Transportation Needs: No Transportation Needs (04/10/2022)   PRAPARE - Hydrologist (Medical): No    Lack of Transportation (Non-Medical): No  Physical Activity: Insufficiently Active (04/10/2022)   Exercise Vital Sign    Days of Exercise per Week: 3 days    Minutes of Exercise per Session: 30 min  Stress: No Stress Concern Present (04/10/2022)    [place]    Feeling of Stress : Not at all  Social Connections: Moderately Integrated (04/10/2022)   Social Connection and Isolation Panel [NHANES]    Frequency of Communication with Friends and Family: More than three times a week    Frequency of Social Gatherings with Friends and Family: More than three times a week    Attends Religious Services: More than 4 times per year    Active Member of [employer] or Organizations: No    Attends Archivist Meetings: Never    Marital Status: Married  Human resources officer Violence: Not At Risk (04/10/2022)   Humiliation, Afraid, Rape, and Kick questionnaire    Fear of Current or Ex-Partner: No    Emotionally Abused: No    Physically Abused: No    Sexually Abused: No    Review of Systems: See HPI, otherwise negative ROS  Physical Exam: BP 138/72   Pulse (!) 57   Temp 97.6 F (36.4 C) (Temporal)   Resp 16   Ht 5\' 2"  (1.575 m)   Wt 81.4 kg   SpO2 94%   BMI 32.81 kg/m  General:   Alert, cooperative in NAD Head:  Normocephalic and atraumatic. Respiratory:  Normal work of breathing. Cardiovascular:  RRR  Impression/Plan: Kimberly Andrade is here for cataract surgery.  Risks, benefits, limitations, and alternatives regarding cataract surgery have been reviewed with the patient.  Questions have been answered.  All parties agreeable.   Birder Robson, MD  05/28/2022, 10:37 AM

## 2022-05-28 NOTE — Op Note (Signed)
PREOPERATIVE DIAGNOSIS:  Nuclear sclerotic cataract of the right eye.   POSTOPERATIVE DIAGNOSIS:  H25.11 Cataract   OPERATIVE PROCEDURE:ORPROCALL@   SURGEON:  Birder Robson, MD.   ANESTHESIA:  Anesthesiologist: Arita Miss, MD CRNA: Tobie Poet, CRNA  1.      Managed anesthesia care. 2.      0.39ml of Shugarcaine was instilled in the eye following the paracentesis.   COMPLICATIONS:  None.   TECHNIQUE:   Stop and chop   DESCRIPTION OF PROCEDURE:  The patient was examined and consented in the preoperative holding area where the aforementioned topical anesthesia was applied to the right eye and then brought back to the Operating Room where the right eye was prepped and draped in the usual sterile ophthalmic fashion and a lid speculum was placed. A paracentesis was created with the side port blade and the anterior chamber was filled with viscoelastic. A near clear corneal incision was performed with the steel keratome. A continuous curvilinear capsulorrhexis was performed with a cystotome followed by the capsulorrhexis forceps. Hydrodissection and hydrodelineation were carried out with BSS on a blunt cannula. The lens was removed in a stop and chop  technique and the remaining cortical material was removed with the irrigation-aspiration handpiece. The capsular bag was inflated with viscoelastic and the Technis [id number]  lens was placed in the capsular bag without complication. The remaining viscoelastic was removed from the eye with the irrigation-aspiration handpiece. The wounds were hydrated. The anterior chamber was flushed with BSS and the eye was inflated to physiologic pressure. 0.57ml of Vigamox was placed in the anterior chamber. The wounds were found to be water tight. The eye was dressed with Combigan. The patient was given protective glasses to wear throughout the day and a shield with which to sleep tonight. The patient was also given drops with which to begin a drop regimen today and  will follow-up with me in one day. Implant Name Type Inv. Item Serial No. Manufacturer Lot No. LRB No. Used Action  LENS IOL TECNIS EYHANCE 20.5 - [id number] Intraocular Lens LENS IOL TECNIS EYHANCE 20.5 [id number] [employer]  Right 1 Implanted   Procedure(s) with comments: CATARACT EXTRACTION PHACO AND INTRAOCULAR LENS PLACEMENT (IOC) RIGHT  5.21  00:37.2 (Right) - sleep apnea  Electronically signed: Birder Robson 05/28/2022 11:04 AM

## 2022-05-28 NOTE — Transfer of Care (Signed)
Immediate Anesthesia Transfer of Care Note  Patient: Kimberly Andrade  Procedure(s) Performed: CATARACT EXTRACTION PHACO AND INTRAOCULAR LENS PLACEMENT (IOC) RIGHT  5.21  00:37.2 (Right: Eye)  Patient Location: PACU  Anesthesia Type: MAC  Level of Consciousness: awake, alert  and patient cooperative  Airway and Oxygen Therapy: Patient Spontanous Breathing and Patient connected to supplemental oxygen  Post-op Assessment: Post-op Vital signs reviewed, Patient's Cardiovascular Status Stable, Respiratory Function Stable, Patent Airway and No signs of Nausea or vomiting  Post-op Vital Signs: Reviewed and stable  Complications: No notable events documented.

## 2022-05-28 NOTE — Anesthesia Postprocedure Evaluation (Signed)
Anesthesia Post Note  Patient: Kimberly Andrade  Procedure(s) Performed: CATARACT EXTRACTION PHACO AND INTRAOCULAR LENS PLACEMENT (IOC) RIGHT  5.21  00:37.2 (Right: Eye)  Patient location during evaluation: PACU Anesthesia Type: MAC Level of consciousness: awake and alert Pain management: pain level controlled Vital Signs Assessment: post-procedure vital signs reviewed and stable Respiratory status: spontaneous breathing, nonlabored ventilation, respiratory function stable and patient connected to nasal cannula oxygen Cardiovascular status: stable and blood pressure returned to baseline Postop Assessment: no apparent nausea or vomiting Anesthetic complications: no   No notable events documented.   Last Vitals:  Vitals:   05/28/22 1107 05/28/22 1112  BP:  120/72  Pulse: (!) 102 (!) 56  Resp: 15 12  Temp:    SpO2: 90% 92%    Last Pain:  Vitals:   05/28/22 1112  TempSrc:   PainSc: 0-No pain                 Arita Miss

## 2022-05-29 ENCOUNTER — Encounter: Payer: Self-pay | Admitting: Ophthalmology

## 2022-05-29 ENCOUNTER — Other Ambulatory Visit: Payer: Self-pay

## 2022-06-13 NOTE — Discharge Instructions (Signed)

## 2022-06-18 ENCOUNTER — Ambulatory Visit
Admission: RE | Admit: 2022-06-18 | Discharge: 2022-06-18 | Disposition: A | Discharge status: Home / Self Care | Payer: Medicare Other | Attending: Ophthalmology | Admitting: Ophthalmology

## 2022-06-18 ENCOUNTER — Other Ambulatory Visit: Payer: Self-pay

## 2022-06-18 ENCOUNTER — Encounter
Admission: RE | Disposition: A | Discharge status: Home / Self Care | Payer: Self-pay | Source: Home / Self Care | Attending: Ophthalmology

## 2022-06-18 ENCOUNTER — Ambulatory Visit: Payer: Medicare Other | Admitting: Anesthesiology

## 2022-06-18 ENCOUNTER — Encounter: Payer: Self-pay | Admitting: Ophthalmology

## 2022-06-18 DIAGNOSIS — Z8249 Family history of ischemic heart disease and other diseases of the circulatory system: Secondary | ICD-10-CM | POA: Insufficient documentation

## 2022-06-18 DIAGNOSIS — H2512 Age-related nuclear cataract, left eye: Secondary | ICD-10-CM | POA: Diagnosis not present

## 2022-06-18 DIAGNOSIS — K5090 Crohn's disease, unspecified, without complications: Secondary | ICD-10-CM | POA: Diagnosis not present

## 2022-06-18 DIAGNOSIS — I10 Essential (primary) hypertension: Secondary | ICD-10-CM | POA: Diagnosis not present

## 2022-06-18 DIAGNOSIS — G4730 Sleep apnea, unspecified: Secondary | ICD-10-CM | POA: Diagnosis not present

## 2022-06-18 DIAGNOSIS — E7800 Pure hypercholesterolemia, unspecified: Secondary | ICD-10-CM | POA: Diagnosis not present

## 2022-06-18 DIAGNOSIS — Z87891 Personal history of nicotine dependence: Secondary | ICD-10-CM | POA: Diagnosis not present

## 2022-06-18 DIAGNOSIS — K219 Gastro-esophageal reflux disease without esophagitis: Secondary | ICD-10-CM | POA: Diagnosis not present

## 2022-06-18 HISTORY — PX: CATARACT EXTRACTION W/PHACO: SHX586

## 2022-06-18 SURGERY — PHACOEMULSIFICATION, CATARACT, WITH IOL INSERTION
Anesthesia: Monitor Anesthesia Care | Site: Eye | Laterality: Left

## 2022-06-18 MED ORDER — TETRACAINE HCL 0.5 % OP SOLN
1.0000 [drp] | OPHTHALMIC | Status: DC | PRN
  Administered 2022-06-18 (×3): 1 [drp] via OPHTHALMIC

## 2022-06-18 MED ORDER — MIDAZOLAM HCL 2 MG/2ML IJ SOLN
INTRAMUSCULAR | Status: DC | PRN
  Administered 2022-06-18: 1 mg via INTRAVENOUS

## 2022-06-18 MED ORDER — MOXIFLOXACIN HCL 0.5 % OP SOLN
OPHTHALMIC | Status: DC | PRN
  Administered 2022-06-18: .2 mL via OPHTHALMIC

## 2022-06-18 MED ORDER — BRIMONIDINE TARTRATE-TIMOLOL 0.2-0.5 % OP SOLN
OPHTHALMIC | Status: DC | PRN
  Administered 2022-06-18: 1 [drp] via OPHTHALMIC

## 2022-06-18 MED ORDER — ARMC OPHTHALMIC DILATING DROPS
1.0000 | OPHTHALMIC | Status: DC | PRN
  Administered 2022-06-18 (×3): 1 via OPHTHALMIC

## 2022-06-18 MED ORDER — SIGHTPATH DOSE#1 NA CHONDROIT SULF-NA HYALURON 40-17 MG/ML IO SOLN
INTRAOCULAR | Status: DC | PRN
  Administered 2022-06-18: 1 mL via INTRAOCULAR

## 2022-06-18 MED ORDER — SIGHTPATH DOSE#1 BSS IO SOLN
INTRAOCULAR | Status: DC | PRN
  Administered 2022-06-18: 15 mL via INTRAOCULAR

## 2022-06-18 MED ORDER — SIGHTPATH DOSE#1 BSS IO SOLN
INTRAOCULAR | Status: DC | PRN
  Administered 2022-06-18: 48 mL via OPHTHALMIC

## 2022-06-18 MED ORDER — FENTANYL CITRATE (PF) 100 MCG/2ML IJ SOLN
INTRAMUSCULAR | Status: DC | PRN
  Administered 2022-06-18: 50 ug via INTRAVENOUS

## 2022-06-18 MED ORDER — LACTATED RINGERS IV SOLN: INTRAVENOUS | Status: DC

## 2022-06-18 MED ORDER — SIGHTPATH DOSE#1 BSS IO SOLN
INTRAOCULAR | Status: DC | PRN
  Administered 2022-06-18: 2 mL

## 2022-06-18 SURGICAL SUPPLY — 14 items
CANNULA ANT/CHMB 27G (MISCELLANEOUS) IMPLANT
CANNULA ANT/CHMB 27GA (MISCELLANEOUS) IMPLANT
CATARACT SUITE SIGHTPATH (MISCELLANEOUS) ×1 IMPLANT
FEE CATARACT SUITE SIGHTPATH (MISCELLANEOUS) ×1 IMPLANT
GLOVE BIOGEL PI IND STRL 8 (GLOVE) ×1 IMPLANT
GLOVE SURG ENC TEXT LTX SZ8 (GLOVE) ×1 IMPLANT
LENS IOL TECNIS EYHANCE 21.0 (Intraocular Lens) IMPLANT
NDL FILTER BLUNT 18X1 1/2 (NEEDLE) ×1 IMPLANT
NEEDLE FILTER BLUNT 18X1 1/2 (NEEDLE) ×1 IMPLANT
PACK VIT ANT 23G (MISCELLANEOUS) IMPLANT
RING MALYGIN (MISCELLANEOUS) IMPLANT
SUT ETHILON 10-0 CS-B-6CS-B-6 (SUTURE)
SUTURE EHLN 10-0 CS-B-6CS-B-6 (SUTURE) IMPLANT
SYR 3ML LL SCALE MARK (SYRINGE) ×1 IMPLANT

## 2022-06-18 NOTE — Anesthesia Postprocedure Evaluation (Signed)
Anesthesia Post Note  Patient: ALISHYA ROEDER  Procedure(s) Performed: CATARACT EXTRACTION PHACO AND INTRAOCULAR LENS PLACEMENT (IOC) LEFT 5.64 00:39.4 (Left: Eye)  Patient location during evaluation: PACU Anesthesia Type: MAC Level of consciousness: awake and alert Pain management: pain level controlled Vital Signs Assessment: post-procedure vital signs reviewed and stable Respiratory status: spontaneous breathing, nonlabored ventilation, respiratory function stable and patient connected to nasal cannula oxygen Cardiovascular status: stable and blood pressure returned to baseline Postop Assessment: no apparent nausea or vomiting Anesthetic complications: no  No notable events documented.   Last Vitals:  Vitals:   06/18/22 1019 06/18/22 1024  BP:  (!) 125/91  Pulse:  65  Resp:  16  Temp: (!) 36.4 C   SpO2: 94% 93%    Last Pain:  Vitals:   06/18/22 1024  TempSrc:   PainSc: 0-No pain                 Stephanie Coup

## 2022-06-18 NOTE — Anesthesia Preprocedure Evaluation (Signed)
Anesthesia Evaluation  Patient identified by MRN, date of birth, ID band Patient awake    Reviewed: Allergy & Precautions, NPO status , Patient's Chart, lab work & pertinent test results  History of Anesthesia Complications Negative for: history of anesthetic complications  Airway Mallampati: II  TM Distance: >3 FB Neck ROM: Full    Dental  (+) Edentulous Lower, Upper Dentures Upper dentures firmly bonded in place:   Pulmonary sleep apnea and Continuous Positive Airway Pressure Ventilation , neg COPD, Patient abstained from smoking.Not current smoker, former smoker   Pulmonary exam normal breath sounds clear to auscultation       Cardiovascular Exercise Tolerance: Good METShypertension, Pt. on medications + angina  (-) CAD and (-) Past MI Normal cardiovascular exam(-) dysrhythmias  Rhythm:Regular Rate:Normal - Systolic murmurs    Neuro/Psych negative neurological ROS  negative psych ROS   GI/Hepatic Neg liver ROS,GERD  Medicated and Controlled,,Crohn disease    Endo/Other  negative endocrine ROSneg diabetes    Renal/GU negative Renal ROS     Musculoskeletal  (+) Arthritis ,    Abdominal   Peds  Hematology HLD   Anesthesia Other Findings Past Medical History: No date: Arthritis No date: B12 deficiency No date: Bronchiectasis (HCC)     Comment:  pulmonology No date: Crohn's disease (HCC) No date: Diverticulosis No date: DJD (degenerative joint disease) 08/16/2019: Essential hypertension No date: Full dentures No date: GERD (gastroesophageal reflux disease)     Comment:  pt deneis  No date: Heart palpitations No date: Hypercholesterolemia No date: Hyperlipidemia No date: Incontinence of urine No date: Nocturnal leg cramps No date: Osteopenia No date: Osteoporosis No date: Reflux No date: Sleep apnea     Comment:  uses a cpap No date: Vertigo     Comment:  last episode 07/2021 No date: Wears glasses   Reproductive/Obstetrics                              Anesthesia Physical Anesthesia Plan  ASA: 2  Anesthesia Plan: MAC   Post-op Pain Management:    Induction: Intravenous  PONV Risk Score and Plan: 2 and Midazolam  Airway Management Planned: Nasal Cannula  Additional Equipment:   Intra-op Plan:   Post-operative Plan:   Informed Consent: I have reviewed the patients History and Physical, chart, labs and discussed the procedure including the risks, benefits and alternatives for the proposed anesthesia with the patient or authorized representative who has indicated his/her understanding and acceptance.       Plan Discussed with: CRNA and Surgeon  Anesthesia Plan Comments: (Explained risks of anesthesia, including PONV, and rare emergencies such as cardiac events, respiratory problems, and allergic reactions, requiring invasive intervention. Discussed the role of CRNA in patient's perioperative care. Patient understands. )         Anesthesia Quick Evaluation  

## 2022-06-18 NOTE — H&P (Signed)
[facility]   Primary Care Physician:  Reubin Milan, MD Ophthalmologist: Dr. Druscilla Brownie  Pre-Procedure History & Physical: HPI:  Kimberly Andrade is a 77 y.o. female here for cataract surgery.   Past Medical History:  Diagnosis Date   Arthritis    B12 deficiency    Bronchiectasis    pulmonology   Crohn's disease    Diverticulosis    DJD (degenerative joint disease)    Essential hypertension 08/16/2019   Full dentures    GERD (gastroesophageal reflux disease)    pt deneis    Heart palpitations    Hypercholesterolemia    Hyperlipidemia    Incontinence of urine    Nocturnal leg cramps    Osteopenia    Osteoporosis    Reflux    Sleep apnea    uses a cpap   Vertigo    last episode 07/2021   Wears glasses     Past Surgical History:  Procedure Laterality Date   ABDOMINAL HYSTERECTOMY     APPENDECTOMY     CARPEL TUNNEL     CATARACT EXTRACTION W/PHACO Right 05/28/2022   Procedure: CATARACT EXTRACTION PHACO AND INTRAOCULAR LENS PLACEMENT (IOC) RIGHT  5.21  00:37.2;  Surgeon: Galen Manila, MD;  Location: [facility];  Service: Ophthalmology;  Laterality: Right;  sleep apnea   CHOLECYSTECTOMY     COLONOSCOPY WITH PROPOFOL N/A 03/11/2016   Procedure: COLONOSCOPY WITH PROPOFOL;  Surgeon: Christena Deem, MD;  Location: [facility] ENDOSCOPY;  Service: Endoscopy;  Laterality: N/A;   DILATION AND CURETTAGE OF UTERUS     ESOPHAGOGASTRODUODENOSCOPY N/A 03/11/2016   Procedure: ESOPHAGOGASTRODUODENOSCOPY (EGD);  Surgeon: Christena Deem, MD;  Location: [facility] ENDOSCOPY;  Service: Endoscopy;  Laterality: N/A;   ESOPHAGOGASTRODUODENOSCOPY (EGD) WITH PROPOFOL N/A 11/11/2017   Procedure: ESOPHAGOGASTRODUODENOSCOPY (EGD) WITH PROPOFOL;  Surgeon: Christena Deem, MD;  Location: [facility] END[facility]COPY;  Service: Endoscopy;  Laterality: N/A;   ESOPHAGOGASTRODUODENOSCOPY (EGD) WITH PROPOFOL N/A 01/20/2018   Procedure: ESOPHAGOGASTRODUODENOSCOPY (EGD) WITH PROPOFOL;  Surgeon:  Christena Deem, MD;  Location: [facility] END[facility]COPY;  Service: Endoscopy;  Laterality: N/A;   FOOT SURGERY Right    HAMMER TOE SURGERY     JOINT REPLACEMENT Right 2006   shoulder   KNEE ARTHROSCOPY Right 05/25/2013   Procedure: ARTHROSCOPY KNEE;  Surgeon: Velna Ochs, MD;  Location: [facility];  Service: Orthopedics;  Laterality: Right;  medial and lateral meniscal debridment and chondroplasty   righti shoulder replacement      ROBOTIC ASSISTED LAPAROSCOPIC REPAIR OF PARAESOPHAGEAL HERNIA  07/16/2018   SHOULDER ARTHROSCOPY WITH ROTATOR CUFF REPAIR AND SUBACROMIAL DECOMPRESSION Left 08/14/2017   Procedure: SHOULDER ARTHROSCOPY WITH ROTATOR CUFF REPAIR AND SUBACROMIAL DECOMPRESSION;  Surgeon: Jones Broom, MD;  Location: [facility] OR;  Service: Orthopedics;  Laterality: Left;   SHOULDER SURGERY Right    X 3   TOTAL HIP ARTHROPLASTY Left 11/25/2017   Procedure: LEFT TOTAL HIP ARTHROPLASTY ANTERIOR APPROACH;  Surgeon: Marcene Corning, MD;  Location: [facility] OR;  Service: Orthopedics;  Laterality: Left;   TOTAL KNEE ARTHROPLASTY Right 04/18/2020   Procedure: RIGHT TOTAL KNEE ARTHROPLASTY;  Surgeon: Marcene Corning, MD;  Location: [facility] ORS;  Service: Orthopedics;  Laterality: Right;    Prior to Admission medications   Medication Sig Start Date End Date Taking? Authorizing Provider  albuterol (VENTOLIN HFA) 108 (90 Base) MCG/ACT inhaler SMARTSIG:2 Puff(s) By Mouth Every 6 Hours PRN 11/08/20  Yes [provider]  Ascorbic Acid (VITAMIN C PO) Take 500 mg by mouth daily.  Yes [provider]  aspirin EC 81 MG tablet Take 1 tablet (81 mg total) by mouth 2 (two) times daily after a meal. 04/18/20  Yes Nida, Greig Castilla, PA-C  Calcium Carbonate-Vit D-Min (CALCIUM 600+D3 PLUS MINERALS) 600-800 MG-UNIT TABS Take 1 tablet by mouth daily.   Yes [provider]  cholecalciferol (VITAMIN D) 25 MCG (1000 UNIT) tablet Take 1,000 Units by mouth daily.   Yes [provider]   ferrous sulfate 324 MG TBEC Take 324 mg by mouth daily with breakfast.   Yes [provider]  isosorbide mononitrate (IMDUR) 30 MG 24 hr tablet Take 30 mg by mouth daily. 05/03/21  Yes [provider]  losartan (COZAAR) 50 MG tablet Take 1 tablet (50 mg total) by mouth daily. 03/11/22  Yes Reubin Milan, MD  Magnesium 250 MG TABS Take 500 mg by mouth daily.   Yes [provider]  mesalamine (PENTASA) 500 MG CR capsule Take 2,000 mg by mouth 2 (two) times daily as needed (Crohn's).   Yes [provider]  Multiple Vitamins-Minerals (MULTIVITAMIN WITH MINERALS) tablet Take 1 tablet by mouth daily.   Yes [provider]  NON FORMULARY BiPap nightly   Yes [provider]  Omega-3 Fatty Acids (FISH OIL PO) Take 1,200 mg by mouth at bedtime.   Yes [provider]  oxybutynin (DITROPAN XL) 15 MG 24 hr tablet Take 1 tablet (15 mg total) by mouth at bedtime. 03/11/22  Yes Reubin Milan, MD  pantoprazole (PROTONIX) 40 MG tablet Take 40 mg by mouth every morning. 06/04/21  Yes [provider]  Potassium 99 MG TABS Take 99 mg by mouth daily.    Yes [provider]  pravastatin (PRAVACHOL) 40 MG tablet Take 1 tablet (40 mg total) by mouth daily. 03/11/22  Yes Reubin Milan, MD  prednisoLONE acetate (PRED FORTE) 1 % ophthalmic suspension Place 1 drop into the right eye 2 (two) times daily. 11/19/21  Yes [provider]  PROLIA 60 MG/ML SOSY injection Inject 60 mg into the skin every 6 (six) months. 03/19/19  Yes [provider]  pyridOXINE (VITAMIN B6) 100 MG tablet Take 100 mg by mouth daily.   Yes [provider]  vitamin B-12 (CYANOCOBALAMIN) 1000 MCG tablet Take 1,000 mcg by mouth daily.   Yes [provider]  zinc gluconate 50 MG tablet Take 50 mg by mouth daily.   Yes [provider]  gabapentin (NEURONTIN) 300 MG capsule Take 2 capsules (600 mg total) by mouth 2 (two) times  daily. Patient not taking: Reported on 05/21/2022 02/13/22   Reubin Milan, MD  meclizine (ANTIVERT) 25 MG tablet Take 1 tablet (25 mg total) by mouth 3 (three) times daily as needed for dizziness. Patient not taking: Reported on 05/21/2022 07/05/21   Leath-Warren, Sadie Haber, NP  meloxicam (MOBIC) 15 MG tablet Take 15 mg by mouth daily. Patient not taking: Reported on 04/10/2022 01/14/22   [provider]    Allergies as of 05/06/2022 - Review Complete 04/10/2022  Allergen Reaction Noted   Fosamax [alendronate sodium] Other (See Comments) 05/24/2013   Lipitor [atorvastatin] Other (See Comments) 05/24/2013   Codeine Other (See Comments) 12/28/2012   Hydrocodone Nausea And Vomiting 05/24/2013    Family History  Problem Relation Age of Onset   Heart disease Mother    Esophageal cancer Father     Social History   Socioeconomic History   Marital status: Married    Spouse name: Not  on file   Number of children: 3   Years of education: Not on file   Highest education level: Associate degree: academic program  Occupational History   Occupation: retired  Tobacco Use   Smoking status: Former    Types: Cigarettes    Quit date: 12/29/2001    Years since quitting: 20.4   Smokeless tobacco: Never  Vaping Use   Vaping Use: Never used  Substance and Sexual Activity   Alcohol use: No   Drug use: No   Sexual activity: Not on file  Other Topics Concern   Not on file  Social History Narrative   Not on file   Social Determinants of Health   Financial Resource Strain: Low Risk  (04/10/2022)   Overall Financial Resource Strain (CARDIA)    Difficulty of Paying Living Expenses: Not hard at all  Food Insecurity: No Food Insecurity (04/10/2022)   Hunger Vital Sign    Worried About Running Out of Food in the Last Year: Never true    Ran Out of Food in the Last Year: Never true  Transportation Needs: No Transportation Needs (04/10/2022)   PRAPARE - Scientist, research (physical sciences) (Medical): No    Lack of Transportation (Non-Medical): No  Physical Activity: Insufficiently Active (04/10/2022)   Exercise Vital Sign    Days of Exercise per Week: 3 days    Minutes of Exercise per Session: 30 min  Stress: No Stress Concern Present (04/10/2022)   [employer] of Occupational Health - Occupational Stress Questionnaire    Feeling of Stress : Not at all  Social Connections: Moderately Integrated (04/10/2022)   Social Connection and Isolation Panel [NHANES]    Frequency of Communication with Friends and Family: More than three times a week    Frequency of Social Gatherings with Friends and Family: More than three times a week    Attends Religious Services: More than 4 times per year    Active Member of [employer] or Organizations: No    Attends Banker Meetings: Never    Marital Status: Married  Catering manager Violence: Not At Risk (04/10/2022)   Humiliation, Afraid, Rape, and Kick questionnaire    Fear of Current or Ex-Partner: No    Emotionally Abused: No    Physically Abused: No    Sexually Abused: No    Review of Systems: See HPI, otherwise negative ROS  Physical Exam: BP (!) 145/69   Pulse 64   Temp (!) 97.4 F (36.3 C) (Temporal)   Wt 80.7 kg   SpO2 96%   BMI 32.56 kg/m  General:   Alert, cooperative in NAD Head:  Normocephalic and atraumatic. Respiratory:  Normal work of breathing. Cardiovascular:  RRR  Impression/Plan: Kimberly Andrade is here for cataract surgery.  Risks, benefits, limitations, and alternatives regarding cataract surgery have been reviewed with the patient.  Questions have been answered.  All parties agreeable.   Galen Manila, MD  06/18/2022, 9:58 AM

## 2022-06-18 NOTE — Transfer of Care (Signed)
Immediate Anesthesia Transfer of Care Note  Patient: Kimberly Andrade  Procedure(s) Performed: CATARACT EXTRACTION PHACO AND INTRAOCULAR LENS PLACEMENT (IOC) LEFT 5.64 00:39.4 (Left: Eye)  Patient Location: PACU  Anesthesia Type: MAC  Level of Consciousness: awake, alert  and patient cooperative  Airway and Oxygen Therapy: Patient Spontanous Breathing and Patient connected to supplemental oxygen  Post-op Assessment: Post-op Vital signs reviewed, Patient's Cardiovascular Status Stable, Respiratory Function Stable, Patent Airway and No signs of Nausea or vomiting  Post-op Vital Signs: Reviewed and stable  Complications: No notable events documented.

## 2022-06-18 NOTE — Op Note (Signed)
PREOPERATIVE DIAGNOSIS:  Nuclear sclerotic cataract of the left eye.   POSTOPERATIVE DIAGNOSIS:  Nuclear sclerotic cataract of the left eye.   OPERATIVE PROCEDURE:ORPROCALL@   SURGEON:  Galen Manila, MD.   ANESTHESIA:  Anesthesiologist: Stephanie Coup, MD CRNA: Barbette Hair, CRNA  1.      Managed anesthesia care. 2.     0.37ml of Shugarcaine was instilled following the paracentesis   COMPLICATIONS:  None.   TECHNIQUE:   Stop and chop   DESCRIPTION OF PROCEDURE:  The patient was examined and consented in the preoperative holding area where the aforementioned topical anesthesia was applied to the left eye and then brought back to the Operating Room where the left eye was prepped and draped in the usual sterile ophthalmic fashion and a lid speculum was placed. A paracentesis was created with the side port blade and the anterior chamber was filled with viscoelastic. A near clear corneal incision was performed with the steel keratome. A continuous curvilinear capsulorrhexis was performed with a cystotome followed by the capsulorrhexis forceps. Hydrodissection and hydrodelineation were carried out with BSS on a blunt cannula. The lens was removed in a stop and chop  technique and the remaining cortical material was removed with the irrigation-aspiration handpiece. The capsular bag was inflated with viscoelastic and the Technis [id number] lens was placed in the capsular bag without complication. The remaining viscoelastic was removed from the eye with the irrigation-aspiration handpiece. The wounds were hydrated. The anterior chamber was flushed with BSS and the eye was inflated to physiologic pressure. 0.45ml Vigamox was placed in the anterior chamber. The wounds were found to be water tight. The eye was dressed with Combigan. The patient was given protective glasses to wear throughout the day and a shield with which to sleep tonight. The patient was also given drops with which to begin a drop regimen  today and will follow-up with me in one day. Implant Name Type Inv. Item Serial No. Manufacturer Lot No. LRB No. Used Action  LENS IOL TECNIS EYHANCE 21.0 - [id number] Intraocular Lens LENS IOL TECNIS EYHANCE 21.0 [id number] SIGHTPATH  Left 1 Implanted    Procedure(s) with comments: CATARACT EXTRACTION PHACO AND INTRAOCULAR LENS PLACEMENT (IOC) LEFT 5.64 00:39.4 (Left) - sleep apnea  Electronically signed: Galen Manila 06/18/2022 10:18 AM

## 2022-06-19 ENCOUNTER — Encounter: Payer: Self-pay | Admitting: Ophthalmology

## 2022-07-02 ENCOUNTER — Ambulatory Visit (INDEPENDENT_AMBULATORY_CARE_PROVIDER_SITE_OTHER): Payer: Medicare Other | Admitting: Dermatology

## 2022-07-02 VITALS — BP 139/86 | HR 79

## 2022-07-02 DIAGNOSIS — L821 Other seborrheic keratosis: Secondary | ICD-10-CM

## 2022-07-02 DIAGNOSIS — D2239 Melanocytic nevi of other parts of face: Secondary | ICD-10-CM | POA: Diagnosis not present

## 2022-07-02 DIAGNOSIS — D225 Melanocytic nevi of trunk: Secondary | ICD-10-CM | POA: Diagnosis not present

## 2022-07-02 DIAGNOSIS — D2271 Melanocytic nevi of right lower limb, including hip: Secondary | ICD-10-CM | POA: Diagnosis not present

## 2022-07-02 DIAGNOSIS — Z1283 Encounter for screening for malignant neoplasm of skin: Secondary | ICD-10-CM

## 2022-07-02 DIAGNOSIS — D229 Melanocytic nevi, unspecified: Secondary | ICD-10-CM

## 2022-07-02 DIAGNOSIS — L578 Other skin changes due to chronic exposure to nonionizing radiation: Secondary | ICD-10-CM

## 2022-07-02 DIAGNOSIS — W57XXXA Bitten or stung by nonvenomous insect and other nonvenomous arthropods, initial encounter: Secondary | ICD-10-CM

## 2022-07-02 DIAGNOSIS — L817 Pigmented purpuric dermatosis: Secondary | ICD-10-CM

## 2022-07-02 DIAGNOSIS — L814 Other melanin hyperpigmentation: Secondary | ICD-10-CM

## 2022-07-02 DIAGNOSIS — S90861A Insect bite (nonvenomous), right foot, initial encounter: Secondary | ICD-10-CM

## 2022-07-02 MED ORDER — CLOBETASOL PROPIONATE 0.05 % EX CREA: TOPICAL_CREAM | CUTANEOUS | 0 refills | Status: DC

## 2022-07-02 NOTE — Patient Instructions (Addendum)
Recommend daily broad spectrum sunscreen SPF 30+ to sun-exposed areas, reapply every 2 hours as needed. Call for new or changing lesions.  Staying in the shade or wearing long sleeves, sun glasses (UVA+UVB protection) and wide brim hats (4-inch brim around the entire circumference of the hat) are also recommended for sun protection.    Due to recent changes in healthcare laws, you may see results of your pathology and/or laboratory studies on MyChart before the doctors have had a chance to review them. We understand that in some cases there may be results that are confusing or concerning to you. Please understand that not all results are received at the same time and often the doctors may need to interpret multiple results in order to provide you with the best plan of care or course of treatment. Therefore, we ask that you please give us 2 business days to thoroughly review all your results before contacting the office for clarification. Should we see a critical lab result, you will be contacted sooner.   If You Need Anything After Your Visit  If you have any questions or concerns for your doctor, please call our main line at [phone] and press option 4 to reach your doctor's medical assistant. If no one answers, please leave a voicemail as directed and we will return your call as soon as possible. Messages left after 4 pm will be answered the following business day.   You may also send us a message via MyChart. We typically respond to MyChart messages within 1-2 business days.  For prescription refills, please ask your pharmacy to contact our office. Our fax number is [phone].  If you have an urgent issue when the clinic is closed that cannot wait until the next business day, you can page your doctor at the number below.    Please note that while we do our best to be available for urgent issues outside of office hours, we are not available 24/7.   If you have an urgent issue and are  unable to reach us, you may choose to seek medical care at your doctor's office, retail clinic, urgent care center, or emergency room.  If you have a medical emergency, please immediately call 911 or go to the emergency department.  Pager Numbers  - Dr. Kowalski: [phone]  - Dr. Moye: [phone]  - Dr. Stewart: [phone]  In the event of inclement weather, please call our main line at [phone] for an update on the status of any delays or closures.  Dermatology Medication Tips: Please keep the boxes that topical medications come in in order to help keep track of the instructions about where and how to use these. Pharmacies typically print the medication instructions only on the boxes and not directly on the medication tubes.   If your medication is too expensive, please contact our office at [phone] option 4 or send us a message through MyChart.   We are unable to tell what your co-pay for medications will be in advance as this is different depending on your insurance coverage. However, we may be able to find a substitute medication at lower cost or fill out paperwork to get insurance to cover a needed medication.   If a prior authorization is required to get your medication covered by your insurance company, please allow us 1-2 business days to complete this process.  Drug prices often vary depending on where the prescription is filled and some pharmacies may offer cheaper prices.  The website www.goodrx.com   contains coupons for medications through different pharmacies. The prices here do not account for what the cost may be with help from insurance (it may be cheaper with your insurance), but the website can give you the price if you did not use any insurance.  - You can print the associated coupon and take it with your prescription to the pharmacy.  - You may also stop by our office during regular business hours and pick up a GoodRx coupon card.  - If you need your  prescription sent electronically to a different pharmacy, notify our office through [facility] MyChart or by phone at [phone] option 4.     Si Usted Necesita Algo Despus de Su Visita  Tambin puede enviarnos un mensaje a travs de MyChart. Por lo general respondemos a los mensajes de MyChart en el transcurso de 1 a 2 das hbiles.  Para renovar recetas, por favor pida a su farmacia que se ponga en contacto con nuestra oficina. Nuestro nmero de fax es el [phone].  Si tiene un asunto urgente cuando la clnica est cerrada y que no puede esperar hasta el siguiente da hbil, puede llamar/localizar a su doctor(a) al nmero que aparece a continuacin.   Por favor, tenga en cuenta que aunque hacemos todo lo posible para estar disponibles para asuntos urgentes fuera del horario de oficina, no estamos disponibles las 24 horas del da, los 7 das de la semana.   Si tiene un problema urgente y no puede comunicarse con nosotros, puede optar por buscar atencin mdica  en el consultorio de su doctor(a), en una clnica privada, en un centro de atencin urgente o en una sala de emergencias.  Si tiene una emergencia mdica, por favor llame inmediatamente al 911 o vaya a la sala de emergencias.  Nmeros de bper  - Dr. Kowalski: [phone]  - Dra. Moye: [phone]  - Dra. Stewart: [phone]  En caso de inclemencias del tiempo, por favor llame a nuestra lnea principal al [phone] para una actualizacin sobre el estado de cualquier retraso o cierre.  Consejos para la medicacin en dermatologa: Por favor, guarde las cajas en las que vienen los medicamentos de uso tpico para ayudarle a seguir las instrucciones sobre dnde y cmo usarlos. Las farmacias generalmente imprimen las instrucciones del medicamento slo en las cajas y no directamente en los tubos del medicamento.   Si su medicamento es muy caro, por favor, pngase en contacto con nuestra oficina llamando al [phone]  y presione la opcin 4 o envenos un mensaje a travs de MyChart.   No podemos decirle cul ser su copago por los medicamentos por adelantado ya que esto es diferente dependiendo de la cobertura de su seguro. Sin embargo, es posible que podamos encontrar un medicamento sustituto a menor costo o llenar un formulario para que el seguro cubra el medicamento que se considera necesario.   Si se requiere una autorizacin previa para que su compaa de seguros cubra su medicamento, por favor permtanos de 1 a 2 das hbiles para completar este proceso.  Los precios de los medicamentos varan con frecuencia dependiendo del lugar de dnde se surte la receta y alguna farmacias pueden ofrecer precios ms baratos.  El sitio web www.goodrx.com tiene cupones para medicamentos de diferentes farmacias. Los precios aqu no tienen en cuenta lo que podra costar con la ayuda del seguro (puede ser ms barato con su seguro), pero el sitio web puede darle el precio si no utiliz ningn seguro.  - Puede imprimir el   cupn correspondiente y llevarlo con su receta a la farmacia.  - Tambin puede pasar por nuestra oficina durante el horario de atencin regular y recoger una tarjeta de cupones de GoodRx.  - Si necesita que su receta se enve electrnicamente a una farmacia diferente, informe a nuestra oficina a travs de MyChart de [place] o por telfono llamando al [phone] y presione la opcin 4.  

## 2022-07-02 NOTE — Progress Notes (Signed)
Follow-Up Visit   Subjective  Kimberly Andrade is a 77 y.o. female who presents for the following: Skin Cancer Screening and Full Body Skin Exam  The patient presents for Total-Body Skin Exam (TBSE) for skin cancer screening and mole check. The patient has spots, moles and lesions to be evaluated, some may be new or changing and the patient has concerns that these could be cancer.    The following portions of the chart were reviewed this encounter and updated as appropriate: medications, allergies, medical history  Review of Systems:  No other skin or systemic complaints except as noted in HPI or Assessment and Plan.  Objective  Well appearing patient in no apparent distress; mood and affect are within normal limits.  A full examination was performed including scalp, head, eyes, ears, nose, lips, neck, chest, axillae, abdomen, back, buttocks, bilateral upper extremities, bilateral lower extremities, hands, feet, fingers, toes, fingernails, and toenails. All findings within normal limits unless otherwise noted below.   Relevant physical exam findings are noted in the Assessment and Plan.    Assessment & Plan   SEBORRHEIC KERATOSES, HEMANGIOMAS - Benign normal skin lesions - Benign-appearing - Call for any changes  MELANOCYTIC NEVI - Tan-brown and/or pink-flesh-colored symmetric macules and papules - Benign appearing on exam today - Observation - Call clinic for new or changing moles - Recommend daily use of broad spectrum spf 30+ sunscreen to sun-exposed areas.   BITE REACTION  Exam: Pink edematous papules/patch on the right foot- very itchy  Treatment Plan:  Start Clobetasol  cream apply to affected skin qd-bid prn. Avoid applying to face, groin, and axilla. Use as directed. Long-term use can cause thinning of the skin.   Topical steroids (such as triamcinolone, fluocinolone, fluocinonide, mometasone, clobetasol, halobetasol, betamethasone, hydrocortisone) can cause  thinning and lightening of the skin if they are used for too long in the same area. Your physician has selected the right strength medicine for your problem and area affected on the body. Please use your medication only as directed by your physician to prevent side effects.      SCHAMBERG'S PURPURA  Exam: Lower legs, cayenne pepper macules with associated skin discoloration involving the ankle and distal lower leg with associated lower leg edema.   Chronic and persistent condition with duration or expected duration over one year. Condition is symptomatic/ bothersome to patient. Not currently at goal.   Treatment plan: Daily compression stocking recommended    ACTINIC DAMAGE - Chronic condition, secondary to cumulative UV/sun exposure - diffuse scaly erythematous macules with underlying dyspigmentation - Recommend daily broad spectrum sunscreen SPF 30+ to sun-exposed areas, reapply every 2 hours as needed.  - Staying in the shade or wearing long sleeves, sun glasses (UVA+UVB protection) and wide brim hats (4-inch brim around the entire circumference of the hat) are also recommended for sun protection.  - Call for new or changing lesions.  Nevus  right calf  6  x  4 mm tan papule darker inferior, with emerging hair, present for years, no changes noted.   right intermammary 6 mm speckled brown papule  Left Malar Cheek 5 mm pink flesh papule (vrs SK)  Right temple 3.0 mm pink brown papule at the edge of a waxy tan papule (with SK)  Benign-appearing.  Stable. Observation.  Call clinic for new or changing lesions.  Recommend daily use of broad spectrum spf 30+ sunscreen to sun-exposed areas.   LENTIGO Exam: Right Upper Back 3 mm 'V" shaped brown macule  Benign-appearing.  Stable. Observation    SKIN CANCER SCREENING PERFORMED TODAY.   Return in about 1 year (around 07/02/2023) for TBSE.  I, Angelique Holm, CMA, am acting as scribe for Willeen Niece, MD .   Documentation: I have  reviewed the above documentation for accuracy and completeness, and I agree with the above.  Willeen Niece, MD

## 2022-07-03 HISTORY — PX: RADIOFREQUENCY ABLATION NERVES: SUR1070

## 2022-08-15 ENCOUNTER — Ambulatory Visit: Payer: Medicare Other | Admitting: Internal Medicine

## 2022-08-21 ENCOUNTER — Ambulatory Visit (INDEPENDENT_AMBULATORY_CARE_PROVIDER_SITE_OTHER): Payer: Medicare Other | Admitting: Internal Medicine

## 2022-08-21 ENCOUNTER — Encounter: Payer: Self-pay | Admitting: Internal Medicine

## 2022-08-21 VITALS — BP 126/78 | HR 57 | Ht 62.0 in | Wt 177.6 lb

## 2022-08-21 DIAGNOSIS — G629 Polyneuropathy, unspecified: Secondary | ICD-10-CM

## 2022-08-21 DIAGNOSIS — I10 Essential (primary) hypertension: Secondary | ICD-10-CM | POA: Diagnosis not present

## 2022-08-21 DIAGNOSIS — E782 Mixed hyperlipidemia: Secondary | ICD-10-CM | POA: Diagnosis not present

## 2022-08-21 DIAGNOSIS — R7303 Prediabetes: Secondary | ICD-10-CM

## 2022-08-21 DIAGNOSIS — R32 Unspecified urinary incontinence: Secondary | ICD-10-CM

## 2022-08-21 MED ORDER — OXYBUTYNIN CHLORIDE ER 15 MG PO TB24: 15.0000 mg | ORAL_TABLET | Freq: Every day | ORAL | 1 refills | Status: DC

## 2022-08-21 MED ORDER — LOSARTAN POTASSIUM 50 MG PO TABS: 50.0000 mg | ORAL_TABLET | Freq: Every day | ORAL | 1 refills | Status: DC

## 2022-08-21 MED ORDER — PRAVASTATIN SODIUM 40 MG PO TABS: 40.0000 mg | ORAL_TABLET | Freq: Every day | ORAL | 1 refills | Status: DC

## 2022-08-21 NOTE — Assessment & Plan Note (Signed)
Stable exam with well controlled BP.  Currently taking losartan. Tolerating medications without concerns or side effects. Will continue to recommend low sodium diet and current regimen.  

## 2022-08-21 NOTE — Assessment & Plan Note (Addendum)
Controlled with diet only.  Weight is down a few pounds. Lab Results  Component Value Date   HGBA1C 6.2 (H) 02/13/2022

## 2022-08-21 NOTE — Progress Notes (Signed)
Date:  08/21/2022   Name:  Kimberly Andrade   DOB:  04/17/1945   MRN:  [id number]   Chief Complaint: Hypertension  Hypertension This is a chronic problem. The problem is controlled. Pertinent negatives include no chest pain, headaches, palpitations or shortness of breath. Past treatments include angiotensin blockers and direct vasodilators. The current treatment provides significant improvement.  Diabetes She presents for her follow-up diabetic visit. Diabetes type: prediabetes. Her disease course has been improving. Pertinent negatives for hypoglycemia include no dizziness, headaches or nervousness/anxiousness. Pertinent negatives for diabetes include no chest pain, no fatigue and no weakness. Current diabetic treatment includes diet. Her weight is decreasing steadily. She is following a diabetic diet. Meal planning includes avoidance of concentrated sweets.   Polyneuropathy - recently diagnosed by neurology.  No cause known.  Started on Lyrica with some improvement.  Back pain - s/p ablation but still having pain - hoping this will improve over time.   Lab Results  Component Value Date   NA 138 02/13/2022   K 4.3 02/13/2022   CO2 23 02/13/2022   GLUCOSE 116 (H) 02/13/2022   BUN 20 02/13/2022   CREATININE 0.98 02/13/2022   CALCIUM 9.8 02/13/2022   EGFR 60 02/13/2022   GFRNONAA >60 04/24/2020   Lab Results  Component Value Date   CHOL 123 02/13/2022   HDL 40 02/13/2022   LDLCALC 65 02/13/2022   TRIG 91 02/13/2022   CHOLHDL 3.1 02/13/2022   Lab Results  Component Value Date   TSH 2.300 02/13/2022   Lab Results  Component Value Date   HGBA1C 6.2 (H) 02/13/2022   Lab Results  Component Value Date   WBC 6.8 02/13/2022   HGB 11.8 02/13/2022   HCT 36.3 02/13/2022   MCV 86 02/13/2022   PLT 276 02/13/2022   Lab Results  Component Value Date   ALT 12 02/13/2022   AST 10 02/13/2022   ALKPHOS 43 (L) 02/13/2022   BILITOT 0.4 02/13/2022   Lab Results  Component  Value Date   VD25OH 42.5 02/13/2022     Review of Systems  Constitutional:  Negative for fatigue and unexpected weight change.  HENT:  Negative for nosebleeds.   Eyes:  Negative for visual disturbance.  Respiratory:  Negative for cough, chest tightness, shortness of breath and wheezing.   Cardiovascular:  Negative for chest pain, palpitations and leg swelling.  Gastrointestinal:  Negative for abdominal pain, constipation and diarrhea.  Musculoskeletal:  Positive for arthralgias, back pain and gait problem.  Neurological:  Positive for numbness. Negative for dizziness, weakness, light-headedness and headaches.  Psychiatric/Behavioral:  Negative for dysphoric mood and sleep disturbance. The patient is not nervous/anxious.     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   Essential hypertension 08/16/2019   Morton's neuroma 07/27/2019   Atherosclerosis of abdominal aorta (HCC) 03/16/2018   DDD (degenerative disc disease), 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/2017   Prediabetes 12/25/2016   Nocturnal leg cramps 12/25/2016   Postmenopausal osteoporosis 10/16/2016   Urinary incontinence in female 10/16/2016   Mixed hyperlipidemia 10/05/2015   GERD (gastroesophageal reflux disease) 04/24/2015   Crohn's disease of small intestine without complication (HCC) 04/24/2015   Obstructive sleep apnea of adult 11/18/2013    Allergies  Allergen Reactions   Fosamax [Alendronate Sodium] Other (See Comments)  Bones hurt   Lipitor [Atorvastatin] Other (See Comments)    Bone pain   Codeine Other (See Comments)     Codeine in the liquid form causes gastritis   Hydrocodone Nausea And Vomiting    Past Surgical History:  Procedure Laterality Date   ABDOMINAL HYSTERECTOMY     APPENDECTOMY      CARPEL TUNNEL     CATARACT EXTRACTION W/PHACO Right 05/28/2022   Procedure: CATARACT EXTRACTION PHACO AND INTRAOCULAR LENS PLACEMENT (IOC) RIGHT  5.21  00:37.2;  Surgeon: Galen Manila, MD;  Location: [facility];  Service: Ophthalmology;  Laterality: Right;  sleep apnea   CATARACT EXTRACTION W/PHACO Left 06/18/2022   Procedure: CATARACT EXTRACTION PHACO AND INTRAOCULAR LENS PLACEMENT (IOC) LEFT 5.64 00:39.4;  Surgeon: Galen Manila, MD;  Location: [facility];  Service: Ophthalmology;  Laterality: Left;  sleep apnea   CHOLECYSTECTOMY     COLONOSCOPY WITH PROPOFOL N/A 03/11/2016   Procedure: COLONOSCOPY WITH PROPOFOL;  Surgeon: Christena Deem, MD;  Location: [facility];  Service: Endoscopy;  Laterality: N/A;   DILATION AND CURETTAGE OF UTERUS     ESOPHAGOGASTRODUODENOSCOPY N/A 03/11/2016   Procedure: ESOPHAGOGASTRODUODENOSCOPY (EGD);  Surgeon: Christena Deem, MD;  Location: [facility];  Service: Endoscopy;  Laterality: N/A;   ESOPHAGOGASTRODUODENOSCOPY (EGD) WITH PROPOFOL N/A 11/11/2017   Procedure: ESOPHAGOGASTRODUODENOSCOPY (EGD) WITH PROPOFOL;  Surgeon: Christena Deem, MD;  Location: [facility];  Service: Endoscopy;  Laterality: N/A;   ESOPHAGOGASTRODUODENOSCOPY (EGD) WITH PROPOFOL N/A 01/20/2018   Procedure: ESOPHAGOGASTRODUODENOSCOPY (EGD) WITH PROPOFOL;  Surgeon: Christena Deem, MD;  Location: [facility];  Service: Endoscopy;  Laterality: N/A;   FOOT SURGERY Right    HAMMER TOE SURGERY     JOINT REPLACEMENT Right 2006   shoulder   KNEE ARTHROSCOPY Right 05/25/2013   Procedure: ARTHROSCOPY KNEE;  Surgeon: Velna Ochs, MD;  Location: [facility];  Service: Orthopedics;  Laterality: Right;  medial and lateral meniscal debridment and chondroplasty   RADIOFREQUENCY ABLATION NERVES Bilateral 07/2022   lumbar spine   righti shoulder replacement      ROBOTIC ASSISTED LAPAROSCOPIC REPAIR OF PARAESOPHAGEAL HERNIA  07/16/2018    SHOULDER ARTHROSCOPY WITH ROTATOR CUFF REPAIR AND SUBACROMIAL DECOMPRESSION Left 08/14/2017   Procedure: SHOULDER ARTHROSCOPY WITH ROTATOR CUFF REPAIR AND SUBACROMIAL DECOMPRESSION;  Surgeon: Jones Broom, MD;  Location: [facility];  Service: Orthopedics;  Laterality: Left;   SHOULDER SURGERY Right    X 3   TOTAL HIP ARTHROPLASTY Left 11/25/2017   Procedure: LEFT TOTAL HIP ARTHROPLASTY ANTERIOR APPROACH;  Surgeon: Marcene Corning, MD;  Location: [facility] OR;  Service: Orthopedics;  Laterality: Left;   TOTAL KNEE ARTHROPLASTY Right 04/18/2020   Procedure: RIGHT TOTAL KNEE ARTHROPLASTY;  Surgeon: Marcene Corning, MD;  Location: [facility];  Service: Orthopedics;  Laterality: Right;    Social History   Tobacco Use   Smoking status: Former    Types: Cigarettes    Quit date: 12/29/2001    Years since quitting: 20.6   Smokeless tobacco: Never  Vaping Use   Vaping Use: Never used  Substance Use Topics   Alcohol use: No   Drug use: No     Medication list has been reviewed and updated.  Current Meds  Medication Sig   albuterol (VENTOLIN HFA) 108 (90 Base) MCG/ACT inhaler SMARTSIG:2 Puff(s) By Mouth Every 6 Hours PRN   Ascorbic Acid (VITAMIN C PO) Take 500 mg by mouth daily.   aspirin EC 81 MG tablet Take 1 tablet (81 mg total) by  mouth 2 (two) times daily after a meal.   Calcium Carbonate-Vit D-Min (CALCIUM 600+D3 PLUS MINERALS) 600-800 MG-UNIT TABS Take 1 tablet by mouth daily.   cholecalciferol (VITAMIN D) 25 MCG (1000 UNIT) tablet Take 1,000 Units by mouth daily.   clobetasol cream (TEMOVATE) 0.05 % Apply to affected skin qd-bid prn flares, Avoid applying to face, groin, and axilla. Use as directed. Long-term use can cause thinning of the skin.   ferrous sulfate 324 MG TBEC Take 324 mg by mouth daily with breakfast.   isosorbide mononitrate (IMDUR) 30 MG 24 hr tablet Take 30 mg by mouth daily.   Magnesium 250 MG TABS Take 500 mg by mouth daily.   mesalamine (PENTASA) 500 MG CR capsule Take  2,000 mg by mouth 2 (two) times daily as needed (Crohn's).   Multiple Vitamins-Minerals (MULTIVITAMIN WITH MINERALS) tablet Take 1 tablet by mouth daily.   NON FORMULARY BiPap nightly   Omega-3 Fatty Acids (FISH OIL PO) Take 1,200 mg by mouth at bedtime.   pantoprazole (PROTONIX) 40 MG tablet Take 40 mg by mouth every morning.   Potassium 99 MG TABS Take 99 mg by mouth daily.    prednisoLONE acetate (PRED FORTE) 1 % ophthalmic suspension Place 1 drop into the right eye 2 (two) times daily.   pregabalin (LYRICA) 25 MG capsule Take 25 mg by mouth 2 (two) times daily.   PROLIA 60 MG/ML SOSY injection Inject 60 mg into the skin every 6 (six) months.   pyridOXINE (VITAMIN B6) 100 MG tablet Take 100 mg by mouth daily.   vitamin B-12 (CYANOCOBALAMIN) 1000 MCG tablet Take 1,000 mcg by mouth daily.   zinc gluconate 50 MG tablet Take 50 mg by mouth daily.   [DISCONTINUED] losartan (COZAAR) 50 MG tablet Take 1 tablet (50 mg total) by mouth daily.   [DISCONTINUED] oxybutynin (DITROPAN XL) 15 MG 24 hr tablet Take 1 tablet (15 mg total) by mouth at bedtime.   [DISCONTINUED] pravastatin (PRAVACHOL) 40 MG tablet Take 1 tablet (40 mg total) by mouth daily.       08/21/2022    8:52 AM 02/13/2022    8:51 AM 09/06/2021    9:45 AM 03/20/2021   11:08 AM  GAD 7 : Generalized Anxiety Score  Nervous, Anxious, on Edge 0 0 0 0  Control/stop worrying 0 0 0 0  Worry too much - different things 0 0 0 0  Trouble relaxing 0 1 0 0  Restless 0 0 0 0  Easily annoyed or irritable 0 0 1 0  Afraid - awful might happen 0 0 0 0  Total GAD 7 Score 0 1 1 0  Anxiety Difficulty Not difficult at all Somewhat difficult Not difficult at all Not difficult at all       08/21/2022    8:52 AM 04/10/2022    9:31 AM 02/13/2022    8:51 AM  Depression screen PHQ 2/9  Decreased Interest 0 0 0  Down, Depressed, Hopeless 0 0 0  PHQ - 2 Score 0 0 0  Altered sleeping 0 0 1  Tired, decreased energy 1 0 1  Change in appetite 0 0 1   Feeling bad or failure about yourself  0 0 0  Trouble concentrating 0 0 0  Moving slowly or fidgety/restless 3 0 0  Suicidal thoughts 0 0 0  PHQ-9 Score 4 0 3  Difficult doing work/chores Not difficult at all Not difficult at all Somewhat difficult    BP Readings from Last  3 Encounters:  08/21/22 126/78  07/02/22 139/86  06/18/22 (!) 125/91    Physical Exam Vitals and nursing note reviewed.  Constitutional:      General: She is not in acute distress.    Appearance: She is well-developed.  HENT:     Head: Normocephalic and atraumatic.  Cardiovascular:     Rate and Rhythm: Normal rate and regular rhythm.     Pulses: Normal pulses.     Heart sounds: No murmur heard. Pulmonary:     Effort: Pulmonary effort is normal. No respiratory distress.     Breath sounds: No wheezing or rhonchi.  Musculoskeletal:     Right lower leg: No edema.     Left lower leg: No edema.  Lymphadenopathy:     Cervical: No cervical adenopathy.  Skin:    General: Skin is warm and dry.     Findings: No rash.  Neurological:     Mental Status: She is alert and oriented to person, place, and time.     Sensory: Sensory deficit present.  Psychiatric:        Mood and Affect: Mood normal.        Behavior: Behavior normal.     Wt Readings from Last 3 Encounters:  08/21/22 177 lb 9.6 oz (80.6 kg)  06/18/22 178 lb (80.7 kg)  05/28/22 179 lb 6.4 oz (81.4 kg)    BP 126/78   Pulse (!) 57   Ht 5\' 2"  (1.575 m)   Wt 177 lb 9.6 oz (80.6 kg)   SpO2 95%   BMI 32.48 kg/m   Assessment and Plan:  Problem List Items Addressed This Visit     Urinary incontinence in female (Chronic)   Relevant Medications   oxybutynin (DITROPAN XL) 15 MG 24 hr tablet   Prediabetes (Chronic)    Controlled with diet only.  Weight is down a few pounds. Lab Results  Component Value Date   HGBA1C 6.2 (H) 02/13/2022        Polyneuropathy   Relevant Medications   pregabalin (LYRICA) 25 MG capsule   Mixed hyperlipidemia  (Chronic)   Relevant Medications   losartan (COZAAR) 50 MG tablet   pravastatin (PRAVACHOL) 40 MG tablet   Essential hypertension - Primary (Chronic)    Stable exam with well controlled BP.  Currently taking losartan. Tolerating medications without concerns or side effects. Will continue to recommend low sodium diet and current regimen.       Relevant Medications   losartan (COZAAR) 50 MG tablet   pravastatin (PRAVACHOL) 40 MG tablet    No follow-ups on file.   Partially dictated using Dragon software, any errors are not intentional.  Reubin Milan, MD [facility] [place], [place]

## 2022-09-19 ENCOUNTER — Encounter: Payer: Self-pay | Admitting: Gastroenterology

## 2022-09-30 ENCOUNTER — Ambulatory Visit
Admission: RE | Admit: 2022-09-30 | Discharge: 2022-09-30 | Disposition: A | Discharge status: Home / Self Care | Payer: Medicare Other | Source: Ambulatory Visit | Attending: Internal Medicine | Admitting: Internal Medicine

## 2022-09-30 DIAGNOSIS — Z1231 Encounter for screening mammogram for malignant neoplasm of breast: Secondary | ICD-10-CM | POA: Diagnosis present

## 2022-11-24 ENCOUNTER — Emergency Department
Admission: EM | Admit: 2022-11-24 | Discharge: 2022-11-25 | Disposition: A | Discharge status: Home / Self Care | Payer: Medicare Other | Attending: Emergency Medicine | Admitting: Emergency Medicine

## 2022-11-24 ENCOUNTER — Other Ambulatory Visit: Payer: Self-pay

## 2022-11-24 ENCOUNTER — Emergency Department: Payer: Medicare Other

## 2022-11-24 DIAGNOSIS — G8929 Other chronic pain: Secondary | ICD-10-CM | POA: Insufficient documentation

## 2022-11-24 DIAGNOSIS — R0602 Shortness of breath: Secondary | ICD-10-CM | POA: Diagnosis not present

## 2022-11-24 DIAGNOSIS — E119 Type 2 diabetes mellitus without complications: Secondary | ICD-10-CM | POA: Insufficient documentation

## 2022-11-24 DIAGNOSIS — Z1152 Encounter for screening for COVID-19: Secondary | ICD-10-CM | POA: Insufficient documentation

## 2022-11-24 DIAGNOSIS — I10 Essential (primary) hypertension: Secondary | ICD-10-CM | POA: Insufficient documentation

## 2022-11-24 DIAGNOSIS — E1140 Type 2 diabetes mellitus with diabetic neuropathy, unspecified: Secondary | ICD-10-CM | POA: Diagnosis not present

## 2022-11-24 DIAGNOSIS — E669 Obesity, unspecified: Secondary | ICD-10-CM | POA: Insufficient documentation

## 2022-11-24 DIAGNOSIS — R079 Chest pain, unspecified: Secondary | ICD-10-CM | POA: Diagnosis present

## 2022-11-24 DIAGNOSIS — I4891 Unspecified atrial fibrillation: Secondary | ICD-10-CM | POA: Insufficient documentation

## 2022-11-24 LAB — CBC WITH DIFFERENTIAL/PLATELET
Abs Immature Granulocytes: 0.05 10*3/uL (ref 0.00–0.07)
Basophils Absolute: 0.1 10*3/uL (ref 0.0–0.1)
Basophils Relative: 1 %
Eosinophils Absolute: 0.3 10*3/uL (ref 0.0–0.5)
Eosinophils Relative: 4 %
HCT: 42 % (ref 36.0–46.0)
Hemoglobin: 14 g/dL (ref 12.0–15.0)
Immature Granulocytes: 1 %
Lymphocytes Relative: 30 %
Lymphs Abs: 2.1 10*3/uL (ref 0.7–4.0)
MCH: 29.1 pg (ref 26.0–34.0)
MCHC: 33.3 g/dL (ref 30.0–36.0)
MCV: 87.3 fL (ref 80.0–100.0)
Monocytes Absolute: 0.7 10*3/uL (ref 0.1–1.0)
Monocytes Relative: 9 %
Neutro Abs: 3.9 10*3/uL (ref 1.7–7.7)
Neutrophils Relative %: 55 %
Platelets: 255 10*3/uL (ref 150–400)
RBC: 4.81 MIL/uL (ref 3.87–5.11)
RDW: 13.9 % (ref 11.5–15.5)
WBC: 7.1 10*3/uL (ref 4.0–10.5)
nRBC: 0 % (ref 0.0–0.2)

## 2022-11-24 MED ORDER — METOPROLOL TARTRATE 5 MG/5ML IV SOLN
5.0000 mg | Freq: Once | INTRAVENOUS | Status: AC
  Administered 2022-11-24: 5 mg via INTRAVENOUS
  Filled 2022-11-24: qty 5

## 2022-11-24 MED ORDER — ACETAMINOPHEN 500 MG PO TABS
1000.0000 mg | ORAL_TABLET | Freq: Once | ORAL | Status: AC
  Administered 2022-11-24: 1000 mg via ORAL
  Filled 2022-11-24: qty 2

## 2022-11-24 MED ORDER — METOPROLOL TARTRATE 25 MG PO TABS
25.0000 mg | ORAL_TABLET | Freq: Once | ORAL | Status: AC
  Administered 2022-11-24: 25 mg via ORAL
  Filled 2022-11-24: qty 1

## 2022-11-24 NOTE — ED Provider Notes (Signed)
[facility] Provider Note    Event Date/Time   First MD Initiated Contact with Patient 11/24/22 2338     (approximate)   History   Chest Pain   HPI  Kimberly Andrade is a 77 y.o. female who presents to the ED for evaluation of Chest Pain   Review of PCP visit from June.  History of DM, HTN, obesity, chronic back pain and neuropathy.  Crohn's disease on mesalamine.  Patient presents to the ED for evaluation of chest pressure and dyspnea for the past 2 to 3 hours.  Reports symptoms started tonight while she was seated on the couch watching television, suddenly developed chest pressure.  No syncope, falls or injury.  Prior to this, for nearly 24 hours she reports a frontal headache and maxillary pressure without vision changes.   Physical Exam   Triage Vital Signs: ED Triage Vitals  Encounter Vitals Group     BP 11/24/22 2325 (!) 140/92     Systolic BP Percentile --      Diastolic BP Percentile --      Pulse Rate 11/24/22 2325 (!) 146     Resp 11/24/22 2325 19     Temp 11/24/22 2325 98 F (36.7 C)     Temp Source 11/24/22 2325 Oral     SpO2 11/24/22 2325 100 %     Weight 11/24/22 2326 175 lb (79.4 kg)     Height 11/24/22 2326 5\' 1"  (1.549 m)     Head Circumference --      Peak Flow --      Pain Score 11/24/22 2326 8     Pain Loc --      Pain Education --      Exclude from Growth Chart --     Most recent vital signs: Vitals:   11/25/22 0330 11/25/22 0400  BP: 126/61   Pulse: (!) 52 (!) 59  Resp: 18 11  Temp:    SpO2: 95% 97%    General: Awake, no distress.  Seems mildly uncomfortable but pleasant and conversational CV:  Good peripheral perfusion.  Tachycardic and irregular Resp:  Normal effort.  Abd:  No distention.  Soft MSK:  No deformity noted.  Minimal trace pitting edema to bilateral lower extremities symmetrically Neuro:  No focal deficits appreciated. Other:     ED Results / Procedures / Treatments   Labs (all labs  ordered are listed, but only abnormal results are displayed) Labs Reviewed  COMPREHENSIVE METABOLIC PANEL - Abnormal; Notable for the following components:      Result Value   CO2 21 (*)    Glucose, Bld 132 (*)    Total Protein 6.4 (*)    Alkaline Phosphatase 32 (*)    All other components within normal limits  BRAIN NATRIURETIC PEPTIDE - Abnormal; Notable for the following components:   B Natriuretic Peptide 174.4 (*)    All other components within normal limits  SARS CORONAVIRUS 2 BY RT PCR  CBC WITH DIFFERENTIAL/PLATELET  MAGNESIUM  TROPONIN I (HIGH SENSITIVITY)  TROPONIN I (HIGH SENSITIVITY)    EKG First EKG with a regular short complex tachycardic rhythm that appears to be SVT at a rate of 151 bpm.  Possibly a flutter 2-1 block.  No STEMI  Repeat EKG about 10 minutes later demonstrates more clear atrial fibrillation with a rate of 104 bpm.  Normal axis and intervals.  No STEMI.  RADIOLOGY CXR interpreted by me with possible right-sided infiltrates CTA chest  interpreted by me without PE or pulmonary filtration   Official radiology report(s): CT Angio Chest PE W and/or Wo Contrast  Result Date: 11/25/2022 CLINICAL DATA:  New onset atrial fibrillation, chest pain, dyspnea EXAM: CT ANGIOGRAPHY CHEST WITH CONTRAST TECHNIQUE: Multidetector CT imaging of the chest was performed using the standard protocol during bolus administration of intravenous contrast. Multiplanar CT image reconstructions and MIPs were obtained to evaluate the vascular anatomy. RADIATION DOSE REDUCTION: This exam was performed according to the departmental dose-optimization program which includes automated exposure control, adjustment of the mA and/or kV according to patient size and/or use of iterative reconstruction technique. CONTRAST:  75mL OMNIPAQUE IOHEXOL 350 MG/ML SOLN COMPARISON:  None Available. FINDINGS: Cardiovascular: Adequate opacification of the pulmonary arterial tree. No intraluminal filling defect  identified to suggest acute pulmonary embolism. Central pulmonary arteries are of normal caliber. No significant coronary artery calcification. Mild cardiomegaly. No pericardial effusion. Mild atherosclerotic calcification within the thoracic aorta. No aortic aneurysm. Mediastinum/Nodes: Visualized thyroid is unremarkable. No pathologic thoracic adenopathy. Esophagus unremarkable. Small hiatal hernia. Lungs/Pleura: Mild elevation of the right hemidiaphragm. Bibasilar atelectasis, right greater than left. No confluent pulmonary infiltrate. No pneumothorax or pleural effusion. No central obstructing lesion. Upper Abdomen: No acute abnormality. Musculoskeletal: Osseous structures are age-appropriate. No acute bone abnormality. No lytic or blastic bone lesion. Review of the MIP images confirms the above findings. IMPRESSION: 1. No pulmonary embolism. No acute intrathoracic pathology identified. 2. Mild cardiomegaly. 3. Small hiatal hernia. Aortic Atherosclerosis (ICD10-I70.0). Electronically Signed   By: Helyn Numbers [name]   On: 11/25/2022 01:36   DG Chest Portable 1 View  Result Date: 11/25/2022 CLINICAL DATA:  chest pain, dyspnea. new onset afib EXAM: PORTABLE CHEST 1 VIEW COMPARISON:  Chest x-ray 04/24/2020, CT chest 11/26/2021. FINDINGS: The heart and mediastinal contours are unchanged. Aortic calcification. Low lung volumes. Developing right mid lung and upper lung zone airspace opacity. No pulmonary edema. No pleural effusion. No pneumothorax. No acute osseous abnormality.  Total right shoulder arthroplasty. IMPRESSION: 1. Low lung volume with developing right mid lung and upper lung zone airspace opacity. Recommend repeat PA and lateral view of the chest with improved inspiratory effort for further evaluation. 2.  Aortic Atherosclerosis (ICD10-I70.0). Electronically Signed   By: Tish Frederickson [name]   On: 11/25/2022 00:41    PROCEDURES and INTERVENTIONS:  .1-3 Lead EKG Interpretation  Performed by:  Delton Prairie, MD Authorized by: Delton Prairie, MD     Interpretation: abnormal     ECG rate:  118   ECG rate assessment: tachycardic     Rhythm: atrial fibrillation     Ectopy: none     Conduction: normal   .Critical Care  Performed by: Delton Prairie, MD Authorized by: Delton Prairie, MD   Critical care provider statement:    Critical care time (minutes):  75   Critical care time was exclusive of:  Separately billable procedures and treating other patients   Critical care was necessary to treat or prevent imminent or life-threatening deterioration of the following conditions:  Cardiac failure and circulatory failure   Critical care was time spent personally by me on the following activities:  Development of treatment plan with patient or surrogate, discussions with consultants, evaluation of patient's response to treatment, examination of patient, ordering and review of laboratory studies, ordering and review of radiographic studies, ordering and performing treatments and interventions, pulse oximetry, re-evaluation of patient's condition and review of old charts .Sedation  Date/Time: 11/25/2022 4:19 AM  Performed by: Delton Prairie,  MD Authorized by: Delton Prairie, MD   Consent:    Consent obtained:  Verbal and written   Consent given by:  Patient and spouse Universal protocol:    Immediately prior to procedure, a time out was called: yes   Indications:    Procedure performed:  Cardioversion Pre-sedation assessment:    Time since last food or drink:  6hr   ASA classification: class 2 - patient with mild systemic disease     Mallampati score:  II - soft palate, uvula, fauces visible   Neck mobility: normal     Pre-sedation assessments completed and reviewed: pre-procedure airway patency not reviewed   Immediate pre-procedure details:    Reassessment: Patient reassessed immediately prior to procedure     Reviewed: vital signs, relevant labs/tests and NPO status     Verified: bag valve  mask available, emergency equipment available, intubation equipment available, IV patency confirmed, oxygen available, reversal medications available and suction available   Procedure details (see MAR for exact dosages):    Preoxygenation:  Nasal cannula   Sedation:  Etomidate   Intended level of sedation: deep   Intra-procedure monitoring:  Blood pressure monitoring, cardiac monitor, continuous pulse oximetry, continuous capnometry, frequent LOC assessments and frequent vital sign checks   Intra-procedure events: none     Total Provider sedation time (minutes):  11 Post-procedure details:    Attendance: Constant attendance by certified staff until patient recovered     Recovery: Patient returned to pre-procedure baseline     Patient is stable for discharge or admission: yes     Procedure completion:  Tolerated well, no immediate complications .Cardioversion  Date/Time: 11/25/2022 4:20 AM  Performed by: Delton Prairie, MD Authorized by: Delton Prairie, MD   Consent:    Consent obtained:  Verbal and written   Consent given by:  Patient and spouse Pre-procedure details:    Cardioversion basis:  Elective   Rhythm:  Atrial fibrillation   Electrode placement:  Anterior-posterior Patient sedated: Yes. Refer to sedation procedure documentation for details of sedation.  Attempt one:    Cardioversion mode:  Synchronous   Shock (Joules):  200   Shock outcome:  No change in rhythm Post-procedure details:    Patient status:  Awake   Patient tolerance of procedure:  Tolerated well, no immediate complications   Medications  acetaminophen (TYLENOL) tablet 1,000 mg (1,000 mg Oral Given 11/24/22 2358)  metoprolol tartrate (LOPRESSOR) injection 5 mg (5 mg Intravenous Given 11/24/22 2358)  metoprolol tartrate (LOPRESSOR) tablet 25 mg (25 mg Oral Given 11/24/22 2358)  iohexol (OMNIPAQUE) 350 MG/ML injection 75 mL (75 mLs Intravenous Contrast Given 11/25/22 0113)  etomidate (AMIDATE) injection 4 mg (4 mg  Intravenous Given 11/25/22 0311)  apixaban (ELIQUIS) tablet 5 mg (5 mg Oral Given 11/25/22 0421)     IMPRESSION / MDM / ASSESSMENT AND PLAN / ED COURSE  I reviewed the triage vital signs and the nursing notes.  Differential diagnosis includes, but is not limited to, ACS, PTX, PNA, muscle strain/spasm, PE, dissection, anxiety, pleural effusion  {Patient presents with symptoms of an acute illness or injury that is potentially life-threatening.  Pleasant woman presents from home with a few hours of chest discomfort consistent with new onset symptomatic atrial fibrillation, ultimately suitable for outpatient management with close cardiology follow-up.  Presents in rapid A-fib, rates improving with metoprolol.  BP remained stable throughout, she looks well does not appear volume overload.  CXR questions of pneumonia, so CTA chest obtained and is essentially normal  alongside some mild cardiomegaly.  CBC and electrolytes are normal.  Troponins are low x 2 and she is COVID-negative.  BNP is noted to be mildly elevated without comparison recently, though again clinically not volume overload or on imaging.  As below, many conversations regarding plan of care and ultimately we do pursue cardioversion to attempt to get her out of A-fib, but this is unfortunately unsuccessful.  She recovers well from this remains in A-fib that is rate controlled, minimally symptomatic and she would like to go home and I think this is reasonable.  She has pre-existing cardiology follow-up scheduled for about 2 weeks from now, but I advised her to go ahead and call later this morning to try to move that appointment up.  We discussed anticoagulation precautions, return precautions for the ED and expectant management.  Discharged with Eliquis, metoprolol prescriptions as well as a free trial certificate for Eliquis for 30 days.  Clinical Course as of 11/25/22 0421  Mon Nov 25, 2022  0032 Reassessed, rates have improved to the 70s,  appears irregular and remaining in A-fib.  She reports feeling better alongside this. [DS]  0110 Reassessed, discussed cxr results and my recommendation for CT. She's agreeable [DS]  0257 Reassessed.  Long conversation with the patient and her husband regarding plan of care.  She is still in A-fib, controlled rates and mildly symptomatic but persistently symptomatic.  We discussed the presumption that she developed symptoms around 9 PM and therefore has only been in A-fib for about 6 hours.  We discussed the possibility of sedation and cardioversion to hopefully get her out of atrial fibrillation considering her fairly reassuring workup otherwise without clear underlying pathologies that would persist and precipitate return to A-fib.  We discussed risks and benefits of sedation and cardioversion.  Answered questions to the best of my ability and after this discussion she would like to go ahead with this procedure to hopefully get her back to a sinus rhythm which I think is reasonable.  I updated the nurse of this plan and placed orders [DS]  0323 Cardioverted once at 200J, after 4mg  etomidate, unfortunately still in Afib. She awakens back to baseline fairly quickly. Husband remains at the bedside throughout [DS]  0338 Reassessed.  Doing well, discussed plan of care.  Remains in slow A-fib [DS]  0412 Reassessed [DS]  4132 Patient up and ambulatory, looks well [DS]    Clinical Course User Index [DS] Delton Prairie, MD     FINAL CLINICAL IMPRESSION(S) / ED DIAGNOSES   Final diagnoses:  New onset atrial fibrillation (HCC)  Atrial fibrillation with RVR (HCC)     Rx / DC Orders   ED Discharge Orders          Ordered    metoprolol tartrate (LOPRESSOR) 25 MG tablet  2 times daily        11/25/22 0414    apixaban (ELIQUIS) 5 MG TABS tablet  2 times daily        11/25/22 0414             Note:  This document was prepared using Dragon voice recognition software and may include unintentional  dictation errors.   Delton Prairie, MD 11/25/22 [phone]

## 2022-11-24 NOTE — ED Triage Notes (Signed)
Pt arrives via [facility] with CC of chest pain/pressure that began tonight when pt was watching tv. EMS reports afib - pt denies hx of afib. Pt reports hx of HTN. Pt reports SOB and slight dizziness.

## 2022-11-25 ENCOUNTER — Emergency Department: Payer: Medicare Other

## 2022-11-25 DIAGNOSIS — I4891 Unspecified atrial fibrillation: Secondary | ICD-10-CM | POA: Diagnosis not present

## 2022-11-25 LAB — COMPREHENSIVE METABOLIC PANEL
ALT: 19 U/L (ref 0–44)
AST: 24 U/L (ref 15–41)
Albumin: 3.7 g/dL (ref 3.5–5.0)
Alkaline Phosphatase: 32 U/L — ABNORMAL LOW (ref 38–126)
Anion gap: 12 (ref 5–15)
BUN: 16 mg/dL (ref 8–23)
CO2: 21 mmol/L — ABNORMAL LOW (ref 22–32)
Calcium: 8.9 mg/dL (ref 8.9–10.3)
Chloride: 108 mmol/L (ref 98–111)
Creatinine, Ser: 0.85 mg/dL (ref 0.44–1.00)
GFR, Estimated: >60 mL/min (ref >60)
Glucose, Bld: 132 mg/dL — ABNORMAL HIGH (ref 70–99)
Potassium: 3.8 mmol/L (ref 3.5–5.1)
Sodium: 141 mmol/L (ref 135–145)
Total Bilirubin: 0.8 mg/dL (ref 0.3–1.2)
Total Protein: 6.4 g/dL — ABNORMAL LOW (ref 6.5–8.1)

## 2022-11-25 LAB — MAGNESIUM: Magnesium: 2.4 mg/dL (ref 1.7–2.4)

## 2022-11-25 LAB — TROPONIN I (HIGH SENSITIVITY)
Troponin I (High Sensitivity): 7 ng/L (ref <18)
Troponin I (High Sensitivity): 8 ng/L (ref <18)

## 2022-11-25 LAB — SARS CORONAVIRUS 2 BY RT PCR: SARS Coronavirus 2 by RT PCR: NEGATIVE

## 2022-11-25 LAB — BRAIN NATRIURETIC PEPTIDE: B Natriuretic Peptide: 174.4 pg/mL — ABNORMAL HIGH (ref 0.0–100.0)

## 2022-11-25 MED ORDER — APIXABAN 5 MG PO TABS
5.0000 mg | ORAL_TABLET | Freq: Once | ORAL | Status: AC
  Administered 2022-11-25: 5 mg via ORAL
  Filled 2022-11-25: qty 1

## 2022-11-25 MED ORDER — IOHEXOL 350 MG/ML SOLN
75.0000 mL | Freq: Once | INTRAVENOUS | Status: AC | PRN
  Administered 2022-11-25: 75 mL via INTRAVENOUS

## 2022-11-25 MED ORDER — ETOMIDATE 2 MG/ML IV SOLN
4.0000 mg | Freq: Once | INTRAVENOUS | Status: AC
  Administered 2022-11-25: 4 mg via INTRAVENOUS
  Filled 2022-11-25: qty 10

## 2022-11-25 MED ORDER — APIXABAN 5 MG PO TABS: 5.0000 mg | ORAL_TABLET | Freq: Two times a day (BID) | ORAL | 3 refills | Status: AC

## 2022-11-25 MED ORDER — METOPROLOL TARTRATE 25 MG PO TABS
25.0000 mg | ORAL_TABLET | Freq: Two times a day (BID) | ORAL | 2 refills | Status: DC
Start: 2022-11-25 — End: 2023-02-17

## 2022-11-25 NOTE — ED Notes (Signed)
Patient transported to CT 

## 2022-11-25 NOTE — ED Notes (Signed)
ED Provider at bedside. 

## 2022-11-25 NOTE — ED Notes (Signed)
Provided pt with graham crackers, peanut butter, and ice water.

## 2022-11-25 NOTE — Discharge Instructions (Addendum)
You were seen in the ED because you have developed atrial fibrillation.   We attempted to get you out of A-fib with electricity (cardioversion) but unfortunately remained in A-fib after this procedure.  You are being discharged with 2 prescriptions: Metoprolol/Lopressor - this medication keeps your heart rate lower and should prevent the fast heart rates that you came in with.  Should make the A-fib less symptomatic  Eliquis - blood thinner to prevent complications from A-fib such as stroke or other blood clots  Follow up with your cardiologist ASAP.   If you develop any worsening symptoms, falling or passing out, chest pain , etc then please return to the ED

## 2022-11-25 NOTE — ED Notes (Signed)
Pt syncardioverted at 200J by provider Katrinka Blazing

## 2022-11-25 NOTE — ED Notes (Signed)
Pt ambulated to restroom and back to bed safely.

## 2022-11-25 NOTE — ED Notes (Signed)
Provided pt with discharge instructions and education. All of pt questions answered. Pt in possession of all belongings. Pt AAOX4 and stable at time of discharge.Pt ambulated w/ steady gait towards ED exit. Pt accompanied by family member.

## 2022-11-25 NOTE — ED Notes (Signed)
Pt placed on CO2 monitor and pads.

## 2022-11-25 NOTE — ED Notes (Signed)
Pt able to demonstrate ability to ambulate around the nurses station w/o assistance or difficulty.

## 2023-01-27 ENCOUNTER — Ambulatory Visit (INDEPENDENT_AMBULATORY_CARE_PROVIDER_SITE_OTHER): Payer: Medicare Other | Admitting: Internal Medicine

## 2023-01-27 ENCOUNTER — Encounter: Payer: Self-pay | Admitting: Internal Medicine

## 2023-01-27 VITALS — BP 128/74 | HR 64 | Ht 61.0 in | Wt 181.0 lb

## 2023-01-27 DIAGNOSIS — R590 Localized enlarged lymph nodes: Secondary | ICD-10-CM | POA: Diagnosis not present

## 2023-01-27 DIAGNOSIS — I480 Paroxysmal atrial fibrillation: Secondary | ICD-10-CM | POA: Diagnosis not present

## 2023-01-27 NOTE — Progress Notes (Signed)
Date:  01/27/2023   Name:  Kimberly Andrade   DOB:  [birth year]   MRN:  [id number]   Chief Complaint: Neck Pain (Right sided neck pain below the ear. Hurts when swallowing. Started 3-4 days ago. )  Neck Pain  This is a new problem. The current episode started in the past 7 days. The problem occurs constantly. The pain is associated with nothing. The pain is present in the left side. The pain is mild. The symptoms are aggravated by swallowing.    Review of Systems  Musculoskeletal:  Positive for neck pain.     Lab Results  Component Value Date   NA 141 11/24/2022   K 3.8 11/24/2022   CO2 21 (L) 11/24/2022   GLUCOSE 132 (H) 11/24/2022   BUN 16 11/24/2022   CREATININE 0.85 11/24/2022   CALCIUM 8.9 11/24/2022   EGFR 60 02/13/2022   GFRNONAA >60 11/24/2022   Lab Results  Component Value Date   CHOL 123 02/13/2022   HDL 40 02/13/2022   LDLCALC 65 02/13/2022   TRIG 91 02/13/2022   CHOLHDL 3.1 02/13/2022   Lab Results  Component Value Date   TSH 2.300 02/13/2022   Lab Results  Component Value Date   HGBA1C 6.2 (H) 02/13/2022   Lab Results  Component Value Date   WBC 7.1 11/24/2022   HGB 14.0 11/24/2022   HCT 42.0 11/24/2022   MCV 87.3 11/24/2022   PLT 255 11/24/2022   Lab Results  Component Value Date   ALT 19 11/24/2022   AST 24 11/24/2022   ALKPHOS 32 (L) 11/24/2022   BILITOT 0.8 11/24/2022   Lab Results  Component Value Date   VD25OH 42.5 02/13/2022     Patient Active Problem List   Diagnosis Date Noted   Paroxysmal A-fib (HCC) 01/27/2023   Polyneuropathy 08/21/2022   Bronchiectasis without complication (HCC) 02/13/2022   Stable angina pectoris (HCC) 04/19/2021   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   Lymphedema 03/30/2021   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   Essential hypertension 08/16/2019   Morton's neuroma 07/27/2019   Atherosclerosis of abdominal aorta (HCC) 03/16/2018   DDD (degenerative disc disease), 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/2017    Prediabetes 12/25/2016   Nocturnal leg cramps 12/25/2016   Postmenopausal osteoporosis 10/16/2016   Urinary incontinence in female 10/16/2016   Mixed hyperlipidemia 10/05/2015   GERD (gastroesophageal reflux disease) 04/24/2015   Crohn's disease of small intestine without complication (HCC) 04/24/2015   Obstructive sleep apnea of adult 11/18/2013    Allergies  Allergen Reactions   Fosamax [Alendronate Sodium] Other (See Comments)    Bones hurt   Lipitor [Atorvastatin] Other (See Comments)    Bone pain   Codeine Other (See Comments)     Codeine in the liquid form causes gastritis   Hydrocodone Nausea And Vomiting    Past Surgical History:  Procedure Laterality Date   ABDOMINAL HYSTERECTOMY     APPENDECTOMY     CARPEL TUNNEL     CATARACT EXTRACTION W/PHACO Right 05/28/2022   Procedure: CATARACT EXTRACTION PHACO AND INTRAOCULAR LENS PLACEMENT (IOC) RIGHT  5.21  00:37.2;  Surgeon: Galen Manila, MD;  Location[facility];  Service: Ophthalmology;  Laterality: Right;  sleep apnea   CATARACT EXTRACTION W/PHACO Left 06/18/2022   Procedure: CATARACT EXTRACTION PHACO AND INTRAOCULAR LENS PLACEMENT (IOC) LEFT 5.64 00:39.4;  Surgeon: Galen Manila, MD;  Location: [facility];  Service: Ophthalmology;  Laterality: Left;  sleep apnea   CHOLECYSTECTOMY     COLONOSCOPY  WITH PROPOFOL N/A 03/11/2016   Procedure: COLONOSCOPY WITH PROPOFOL;  Surgeon: Christena Deem, MD;  Location: [facility];  Service: Endoscopy;  Laterality: N/A;   DILATION AND CURETTAGE OF UTERUS     ESOPHAGOGASTRODUODENOSCOPY N/A 03/11/2016   Procedure: ESOPHAGOGASTRODUODENOSCOPY (EGD);  Surgeon: Christena Deem, MD;  Location: [facility] ENDOSCOPY;  Service: Endoscopy;  Laterality: N/A;   ESOPHAGOGASTRODUODENOSCOPY (EGD) WITH PROPOFOL N/A 11/11/2017   Procedure: ESOPHAGOGASTRODUODENOSCOPY (EGD) WITH PROPOFOL;  Surgeon: Christena Deem, MD;  Location: [facility] END[facility]COPY;  Service: Endoscopy;   Laterality: N/A;   ESOPHAGOGASTRODUODENOSCOPY (EGD) WITH PROPOFOL N/A 01/20/2018   Procedure: ESOPHAGOGASTRODUODENOSCOPY (EGD) WITH PROPOFOL;  Surgeon: Christena Deem, MD;  Location: [facility] END[facility];  Service: Endoscopy;  Laterality: N/A;   FOOT SURGERY Right    HAMMER TOE SURGERY     JOINT REPLACEMENT Right 2006   shoulder   KNEE ARTHROSCOPY Right 05/25/2013   Procedure: ARTHROSCOPY KNEE;  Surgeon: Velna Ochs, MD;  Location: [facility];  Service: Orthopedics;  Laterality: Right;  medial and lateral meniscal debridment and chondroplasty   RADIOFREQUENCY ABLATION NERVES Bilateral 07/2022   lumbar spine   righti shoulder replacement      ROBOTIC ASSISTED LAPAROSCOPIC REPAIR OF PARAESOPHAGEAL HERNIA  07/16/2018   SHOULDER ARTHROSCOPY WITH ROTATOR CUFF REPAIR AND SUBACROMIAL DECOMPRESSION Left 08/14/2017   Procedure: SHOULDER ARTHROSCOPY WITH ROTATOR CUFF REPAIR AND SUBACROMIAL DECOMPRESSION;  Surgeon: Jones Broom, MD;  Location: [facility] OR;  Service: Orthopedics;  Laterality: Left;   SHOULDER SURGERY Right    X 3   TOTAL HIP ARTHROPLASTY Left 11/25/2017   Procedure: LEFT TOTAL HIP ARTHROPLASTY ANTERIOR APPROACH;  Surgeon: Marcene Corning, MD;  Location: [facility] OR;  Service: Orthopedics;  Laterality: Left;   TOTAL KNEE ARTHROPLASTY Right 04/18/2020   Procedure: RIGHT TOTAL KNEE ARTHROPLASTY;  Surgeon: Marcene Corning, MD;  Location: [facility];  Service: Orthopedics;  Laterality: Right;    Social History   Tobacco Use   Smoking status: Former    Current packs/day: 0.00    Types: Cigarettes    Quit date: 12/29/2001    Years since quitting: 21.0   Smokeless tobacco: Never  Vaping Use   Vaping status: Never Used  Substance Use Topics   Alcohol use: No   Drug use: No     Medication list has been reviewed and updated.  Current Meds  Medication Sig   albuterol (VENTOLIN HFA) 108 (90 Base) MCG/ACT inhaler SMARTSIG:2 Puff(s) By Mouth Every 6 Hours PRN   apixaban  (ELIQUIS) 5 MG TABS tablet Take 1 tablet (5 mg total) by mouth 2 (two) times daily.   Ascorbic Acid (VITAMIN C PO) Take 500 mg by mouth daily.   aspirin EC 81 MG tablet Take 1 tablet (81 mg total) by mouth 2 (two) times daily after a meal.   Calcium Carbonate-Vit D-Min (CALCIUM 600+D3 PLUS MINERALS) 600-800 MG-UNIT TABS Take 1 tablet by mouth daily.   cholecalciferol (VITAMIN D) 25 MCG (1000 UNIT) tablet Take 1,000 Units by mouth daily.   clobetasol cream (TEMOVATE) 0.05 % Apply to affected skin qd-bid prn flares, Avoid applying to face, groin, and axilla. Use as directed. Long-term use can cause thinning of the skin.   ferrous sulfate 324 MG TBEC Take 324 mg by mouth daily with breakfast.   isosorbide mononitrate (IMDUR) 30 MG 24 hr tablet Take 30 mg by mouth daily.   losartan (COZAAR) 50 MG tablet Take 1 tablet (50 mg total) by mouth daily.   Magnesium 250 MG TABS Take  500 mg by mouth daily.   mesalamine (PENTASA) 500 MG CR capsule Take 2,000 mg by mouth 2 (two) times daily as needed (Crohn's).   metoprolol tartrate (LOPRESSOR) 25 MG tablet Take 1 tablet (25 mg total) by mouth 2 (two) times daily.   Multiple Vitamins-Minerals (MULTIVITAMIN WITH MINERALS) tablet Take 1 tablet by mouth daily.   NON FORMULARY BiPap nightly   Omega-3 Fatty Acids (FISH OIL PO) Take 1,200 mg by mouth at bedtime.   oxybutynin (DITROPAN XL) 15 MG 24 hr tablet Take 1 tablet (15 mg total) by mouth at bedtime.   pantoprazole (PROTONIX) 40 MG tablet Take 40 mg by mouth every morning.   Potassium 99 MG TABS Take 99 mg by mouth daily.    pravastatin (PRAVACHOL) 40 MG tablet Take 1 tablet (40 mg total) by mouth daily.   prednisoLONE acetate (PRED FORTE) 1 % ophthalmic suspension Place 1 drop into the right eye 2 (two) times daily.   pregabalin (LYRICA) 25 MG capsule Take 25 mg by mouth. Patient taking 1 in the AM, 1 at Lunch, and 3 at night.   pyridOXINE (VITAMIN B6) 100 MG tablet Take 100 mg by mouth daily.   vitamin B-12  (CYANOCOBALAMIN) 1000 MCG tablet Take 1,000 mcg by mouth daily.   zinc gluconate 50 MG tablet Take 50 mg by mouth daily.   zoledronic acid (RECLAST) 5 MG/100ML SOLN injection Inject 5 mg into the vein once.   [DISCONTINUED] PROLIA 60 MG/ML SOSY injection Inject 60 mg into the skin every 6 (six) months.       08/21/2022    8:52 AM 02/13/2022    8:51 AM 09/06/2021    9:45 AM 03/20/2021   11:08 AM  GAD 7 : Generalized Anxiety Score  Nervous, Anxious, on Edge 0 0 0 0  Control/stop worrying 0 0 0 0  Worry too much - different things 0 0 0 0  Trouble relaxing 0 1 0 0  Restless 0 0 0 0  Easily annoyed or irritable 0 0 1 0  Afraid - awful might happen 0 0 0 0  Total GAD 7 Score 0 1 1 0  Anxiety Difficulty Not difficult at all Somewhat difficult Not difficult at all Not difficult at all       08/21/2022    8:52 AM 04/10/2022    9:31 AM 02/13/2022    8:51 AM  Depression screen PHQ 2/9  Decreased Interest 0 0 0  Down, Depressed, Hopeless 0 0 0  PHQ - 2 Score 0 0 0  Altered sleeping 0 0 1  Tired, decreased energy 1 0 1  Change in appetite 0 0 1  Feeling bad or failure about yourself  0 0 0  Trouble concentrating 0 0 0  Moving slowly or fidgety/restless 3 0 0  Suicidal thoughts 0 0 0  PHQ-9 Score 4 0 3  Difficult doing work/chores Not difficult at all Not difficult at all Somewhat difficult    BP Readings from Last 3 Encounters:  01/27/23 128/74  11/25/22 104/86  08/21/22 126/78    Physical Exam Constitutional:      Appearance: Normal appearance.  HENT:     Head:     Jaw: There is normal jaw occlusion.     Salivary Glands: Right salivary gland is not diffusely enlarged. Left salivary gland is not diffusely enlarged.     Right Ear: Tympanic membrane normal.     Left Ear: Tympanic membrane normal.     Nose:  Right Sinus: No maxillary sinus tenderness or frontal sinus tenderness.     Left Sinus: No maxillary sinus tenderness or frontal sinus tenderness.     Mouth/Throat:      Pharynx: No oropharyngeal exudate.      Comments: Minor erythema on right peri-tonsilar region  Cardiovascular:     Rate and Rhythm: Normal rate and regular rhythm.     Pulses: Normal pulses.  Pulmonary:     Effort: Pulmonary effort is normal.     Breath sounds: Normal breath sounds. No wheezing or rhonchi.  Musculoskeletal:     Cervical back: Normal range of motion.  Lymphadenopathy:     Cervical: Cervical adenopathy present.     Right cervical: Superficial cervical adenopathy present.     Comments: Fullness - no discrete mass  Neurological:     Mental Status: She is alert.     Wt Readings from Last 3 Encounters:  01/27/23 181 lb (82.1 kg)  11/24/22 175 lb (79.4 kg)  08/21/22 177 lb 9.6 oz (80.6 kg)    BP 128/74   Pulse 64   Ht 5\' 1"  (1.549 m)   Wt 181 lb (82.1 kg)   SpO2 98%   BMI 34.20 kg/m   Assessment and Plan:  Problem List Items Addressed This Visit       Unprioritized   Paroxysmal A-fib (HCC)    Followed by Cardiology at [facility] on Eliquis Cardiac work up negative      Other Visit Diagnoses     Cervical lymphadenopathy    -  Primary   no other symptoms to suggest viral or bacterial etiology recommend monitoring - continue Tylenol, use warm compresses prn f/u PRN worsening       No follow-ups on file.    Reubin Milan, MD [facility] [place]

## 2023-01-27 NOTE — Assessment & Plan Note (Addendum)
Followed by Cardiology at [facility] on Eliquis Cardiac work up negative

## 2023-02-17 ENCOUNTER — Encounter: Payer: Self-pay | Admitting: Internal Medicine

## 2023-02-17 ENCOUNTER — Ambulatory Visit (INDEPENDENT_AMBULATORY_CARE_PROVIDER_SITE_OTHER): Payer: Medicare Other | Admitting: Internal Medicine

## 2023-02-17 VITALS — BP 112/74 | HR 64 | Ht 61.0 in | Wt 182.0 lb

## 2023-02-17 DIAGNOSIS — R7303 Prediabetes: Secondary | ICD-10-CM

## 2023-02-17 DIAGNOSIS — J479 Bronchiectasis, uncomplicated: Secondary | ICD-10-CM

## 2023-02-17 DIAGNOSIS — K5000 Crohn's disease of small intestine without complications: Secondary | ICD-10-CM

## 2023-02-17 DIAGNOSIS — R32 Unspecified urinary incontinence: Secondary | ICD-10-CM

## 2023-02-17 DIAGNOSIS — G4733 Obstructive sleep apnea (adult) (pediatric): Secondary | ICD-10-CM

## 2023-02-17 DIAGNOSIS — Z23 Encounter for immunization: Secondary | ICD-10-CM

## 2023-02-17 DIAGNOSIS — E782 Mixed hyperlipidemia: Secondary | ICD-10-CM

## 2023-02-17 DIAGNOSIS — I700 Atherosclerosis of aorta: Secondary | ICD-10-CM

## 2023-02-17 DIAGNOSIS — I10 Essential (primary) hypertension: Secondary | ICD-10-CM

## 2023-02-17 DIAGNOSIS — M810 Age-related osteoporosis without current pathological fracture: Secondary | ICD-10-CM

## 2023-02-17 DIAGNOSIS — Z1211 Encounter for screening for malignant neoplasm of colon: Secondary | ICD-10-CM

## 2023-02-17 DIAGNOSIS — I480 Paroxysmal atrial fibrillation: Secondary | ICD-10-CM

## 2023-02-17 MED ORDER — PRAVASTATIN SODIUM 40 MG PO TABS
40.0000 mg | ORAL_TABLET | Freq: Every day | ORAL | 1 refills | Status: DC
Start: 2023-02-17 — End: 2023-09-02

## 2023-02-17 MED ORDER — LOSARTAN POTASSIUM 50 MG PO TABS
50.0000 mg | ORAL_TABLET | Freq: Every day | ORAL | 1 refills | Status: DC
Start: 2023-02-17 — End: 2023-09-02

## 2023-02-17 MED ORDER — OXYBUTYNIN CHLORIDE ER 15 MG PO TB24
15.0000 mg | ORAL_TABLET | Freq: Every day | ORAL | 1 refills | Status: DC
Start: 2023-02-17 — End: 2023-09-02

## 2023-02-17 NOTE — Assessment & Plan Note (Signed)
 Controlled BP with normal exam. Current regimen is losartan. Will continue same medications; encourage continued reduced sodium diet.

## 2023-02-17 NOTE — Assessment & Plan Note (Signed)
Good compliance with nightly Bipap

## 2023-02-17 NOTE — Assessment & Plan Note (Addendum)
Followed by Pulmonary - Dr. Meredeth Ide On albuterol PRN - no recent chest infections.

## 2023-02-17 NOTE — Progress Notes (Signed)
Date:  02/17/2023   Name:  Kimberly Andrade   DOB:  September 02, 1945   MRN:  [id number]   Chief Complaint: Annual Exam Kimberly Andrade is a 77 y.o. female who presents today for her Complete Annual Exam. She feels fairly well. She reports exercising - some. She reports she is sleeping fairly well. Breast complaints - none.  Mammogram: 09/2022 DEXA: 09/2020 osteoporosis Colonoscopy: 06/2019 - scheduled for 03/13/23  Health Maintenance Due  Topic Date Due   Colonoscopy  06/15/2022   Medicare Annual Wellness (AWV)  04/11/2023    Immunization History  Administered Date(s) Administered   Fluad Quad(high Dose 65+) 12/11/2018, 02/16/2020, 02/19/2021, 02/13/2022   Fluad Trivalent(High Dose 65+) 02/17/2023   Fluzone Influenza virus vaccine,trivalent (IIV3), split virus 01/22/2012   Influenza, High Dose Seasonal PF 11/27/2017   Influenza, Seasonal, Injecte, Preservative Fre 11/27/2017   PNEUMOCOCCAL CONJUGATE-20 02/13/2022   Pneumococcal Conjugate-13 06/29/2015   Pneumococcal Polysaccharide-23 04/29/2011   Tdap 06/29/2015, 07/10/2016   Zoster Recombinant(Shingrix) 10/19/2020, 01/19/2021   Zoster, Live 02/01/2014     Hypertension This is a chronic problem. The problem is controlled. Pertinent negatives include no chest pain, headaches, palpitations or shortness of breath. Past treatments include angiotensin blockers. The current treatment provides significant improvement.  Hyperlipidemia This is a chronic problem. The problem is controlled. Pertinent negatives include no chest pain, focal weakness or shortness of breath. Current antihyperlipidemic treatment includes statins.  Osteoporosis - followed by Endo. Atrial Fibrillation - followed by Cardiology. On Eliquis without bleeding issues. Cardiac evaluation was normal.  Unclear reason for the one episode.   ECHO 10/24: CONCLUSION -------------------------------------------------------------------------------  NORMAL LEFT  VENTRICULAR SYSTOLIC FUNCTION WITH MILD LVH  ESTIMATED EF: >55%  ELEVATED LA PRESSURES WITH DIASTOLIC DYSFUNCTION  NORMAL RIGHT VENTRICULAR SYSTOLIC FUNCTION  VALVULAR REGURGITATION: No AR, TRIVIAL MR, MILD PR, MILD TR  NO VALVULAR STENOSIS   NM myocardial perfusion  12/2022:  Normal myocardial perfusion scan no evidence of stress-induced  myocardial ischemia ejection fraction of 75% conclusion normal scan.  This  is a low risk study    Review of Systems  Constitutional:  Negative for chills, fatigue and fever.  HENT:  Negative for congestion, hearing loss, tinnitus, trouble swallowing and voice change.   Eyes:  Negative for visual disturbance.  Respiratory:  Negative for cough, chest tightness, shortness of breath and wheezing.   Cardiovascular:  Negative for chest pain, palpitations and leg swelling.  Gastrointestinal:  Negative for abdominal pain, constipation, diarrhea and vomiting.  Endocrine: Negative for polydipsia and polyuria.  Genitourinary:  Negative for dysuria, frequency, genital sores, vaginal bleeding and vaginal discharge.  Musculoskeletal:  Negative for arthralgias, gait problem and joint swelling.  Skin:  Negative for color change and rash.  Neurological:  Negative for dizziness, tremors, focal weakness, light-headedness and headaches.  Hematological:  Negative for adenopathy. Does not bruise/bleed easily.  Psychiatric/Behavioral:  Negative for dysphoric mood and sleep disturbance. The patient is not nervous/anxious.      Lab Results  Component Value Date   NA 141 11/24/2022   K 3.8 11/24/2022   CO2 21 (L) 11/24/2022   GLUCOSE 132 (H) 11/24/2022   BUN 16 11/24/2022   CREATININE 0.85 11/24/2022   CALCIUM 8.9 11/24/2022   EGFR 60 02/13/2022   GFRNONAA >60 11/24/2022   Lab Results  Component Value Date   CHOL 123 02/13/2022   HDL 40 02/13/2022   LDLCALC 65 02/13/2022   TRIG 91 02/13/2022   CHOLHDL 3.1 02/13/2022   Lab  Results  Component Value Date    TSH 2.300 02/13/2022   Lab Results  Component Value Date   HGBA1C 6.2 (H) 02/13/2022   Lab Results  Component Value Date   WBC 7.1 11/24/2022   HGB 14.0 11/24/2022   HCT 42.0 11/24/2022   MCV 87.3 11/24/2022   PLT 255 11/24/2022   Lab Results  Component Value Date   ALT 19 11/24/2022   AST 24 11/24/2022   ALKPHOS 32 (L) 11/24/2022   BILITOT 0.8 11/24/2022   Lab Results  Component Value Date   VD25OH 42.5 02/13/2022     Patient Active Problem List   Diagnosis Date Noted   Paroxysmal A-fib (HCC) 01/27/2023   Polyneuropathy 08/21/2022   Bronchiectasis without complication (HCC) 02/13/2022   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   Lymphedema 03/30/2021   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   Essential hypertension 08/16/2019   Morton's neuroma 07/27/2019   Atherosclerosis of abdominal aorta (HCC) 03/16/2018   DDD (degenerative disc disease), 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/2017   Prediabetes 12/25/2016   Nocturnal leg cramps 12/25/2016   Postmenopausal osteoporosis 10/16/2016   Urinary incontinence in female 10/16/2016   Mixed hyperlipidemia 10/05/2015   GERD (gastroesophageal reflux disease) 04/24/2015   Crohn's disease of small intestine without complication (HCC) 04/24/2015   Obstructive sleep apnea of adult 11/18/2013    Allergies  Allergen Reactions   Fosamax [Alendronate Sodium] Other (See Comments)    Bones hurt   Lipitor [Atorvastatin] Other (See Comments)    Bone pain   Codeine Other (See Comments)     Codeine in the liquid form causes gastritis   Hydrocodone Nausea And Vomiting    Past Surgical History:  Procedure Laterality Date   ABDOMINAL HYSTERECTOMY     APPENDECTOMY     CARPEL TUNNEL     CATARACT EXTRACTION W/PHACO Right 05/28/2022   Procedure: CATARACT EXTRACTION PHACO AND INTRAOCULAR LENS PLACEMENT (IOC) RIGHT  5.21  00:37.2;  Surgeon: Galen Manila, MD;  Location: [facility];  Service: Ophthalmology;  Laterality: Right;  sleep apnea   CATARACT  EXTRACTION W/PHACO Left 06/18/2022   Procedure: CATARACT EXTRACTION PHACO AND INTRAOCULAR LENS PLACEMENT (IOC) LEFT 5.64 00:39.4;  Surgeon: Galen Manila, MD;  Location: [facility];  Service: Ophthalmology;  Laterality: Left;  sleep apnea   CHOLECYSTECTOMY     COLONOSCOPY WITH PROPOFOL N/A 03/11/2016   Procedure: COLONOSCOPY WITH PROPOFOL;  Surgeon: Christena Deem, MD;  Location: [facility] END[facility];  Service: Endoscopy;  Laterality: N/A;   DILATION AND CURETTAGE OF UTERUS     ESOPHAGOGASTRODUODENOSCOPY N/A 03/11/2016   Procedure: ESOPHAGOGASTRODUODENOSCOPY (EGD);  Surgeon: Christena Deem, MD;  Location: [facility] ENDOSCOPY;  Service: Endoscopy;  Laterality: N/A;   ESOPHAGOGASTRODUODENOSCOPY (EGD) WITH PROPOFOL N/A 11/11/2017   Procedure: ESOPHAGOGASTRODUODENOSCOPY (EGD) WITH PROPOFOL;  Surgeon: Christena Deem, MD;  Location: [facility] ENDOSCOPY;  Service: Endoscopy;  Laterality: N/A;   ESOPHAGOGASTRODUODENOSCOPY (EGD) WITH PROPOFOL N/A 01/20/2018   Procedure: ESOPHAGOGASTRODUODENOSCOPY (EGD) WITH PROPOFOL;  Surgeon: Christena Deem, MD;  Location: [facility] END[facility]COPY;  Service: Endoscopy;  Laterality: N/A;   FOOT SURGERY Right    HAMMER TOE SURGERY     JOINT REPLACEMENT Right 2006   shoulder   KNEE ARTHROSCOPY Right 05/25/2013   Procedure: ARTHROSCOPY KNEE;  Surgeon: Velna Ochs, MD;  Location: [facility];  Service: Orthopedics;  Laterality: Right;  medial and lateral meniscal debridment and chondroplasty   RADIOFREQUENCY ABLATION NERVES Bilateral 07/2022   lumbar spine   righti shoulder replacement  ROBOTIC ASSISTED LAPAROSCOPIC REPAIR OF PARAESOPHAGEAL HERNIA  07/16/2018   SHOULDER ARTHROSCOPY WITH ROTATOR CUFF REPAIR AND SUBACROMIAL DECOMPRESSION Left 08/14/2017   Procedure: SHOULDER ARTHROSCOPY WITH ROTATOR CUFF REPAIR AND SUBACROMIAL DECOMPRESSION;  Surgeon: Jones Broom, MD;  Location: [facility] OR;  Service: Orthopedics;  Laterality: Left;   SHOULDER SURGERY  Right    X 3   TOTAL HIP ARTHROPLASTY Left 11/25/2017   Procedure: LEFT TOTAL HIP ARTHROPLASTY ANTERIOR APPROACH;  Surgeon: Marcene Corning, MD;  Location: [facility] OR;  Service: Orthopedics;  Laterality: Left;   TOTAL KNEE ARTHROPLASTY Right 04/18/2020   Procedure: RIGHT TOTAL KNEE ARTHROPLASTY;  Surgeon: Marcene Corning, MD;  Location: [facility];  Service: Orthopedics;  Laterality: Right;    Social History   Tobacco Use   Smoking status: Former    Current packs/day: 0.00    Types: Cigarettes    Quit date: 12/29/2001    Years since quitting: 21.1   Smokeless tobacco: Never  Vaping Use   Vaping status: Never Used  Substance Use Topics   Alcohol use: No   Drug use: No     Medication list has been reviewed and updated.  Current Meds  Medication Sig   albuterol (VENTOLIN HFA) 108 (90 Base) MCG/ACT inhaler SMARTSIG:2 Puff(s) By Mouth Every 6 Hours PRN   Ascorbic Acid (VITAMIN C PO) Take 500 mg by mouth daily.   aspirin EC 81 MG tablet Take 1 tablet (81 mg total) by mouth 2 (two) times daily after a meal.   Calcium Carbonate-Vit D-Min (CALCIUM 600+D3 PLUS MINERALS) 600-800 MG-UNIT TABS Take 1 tablet by mouth daily.   cholecalciferol (VITAMIN D) 25 MCG (1000 UNIT) tablet Take 1,000 Units by mouth daily.   ferrous sulfate 324 MG TBEC Take 324 mg by mouth daily with breakfast.   Magnesium 250 MG TABS Take 500 mg by mouth daily.   mesalamine (PENTASA) 500 MG CR capsule Take 2,000 mg by mouth 2 (two) times daily as needed (Crohn's).   Multiple Vitamins-Minerals (MULTIVITAMIN WITH MINERALS) tablet Take 1 tablet by mouth daily.   NON FORMULARY BiPap nightly   Omega-3 Fatty Acids (FISH OIL PO) Take 1,200 mg by mouth at bedtime.   pantoprazole (PROTONIX) 40 MG tablet Take 40 mg by mouth every morning.   Potassium 99 MG TABS Take 99 mg by mouth daily.    prednisoLONE acetate (PRED FORTE) 1 % ophthalmic suspension Place 1 drop into the right eye 2 (two) times daily.   pregabalin (LYRICA) 25 MG  capsule Take 25 mg by mouth. Patient taking 1 in the AM, 1 at Lunch, and 3 at night.   pyridOXINE (VITAMIN B6) 100 MG tablet Take 100 mg by mouth daily.   vitamin B-12 (CYANOCOBALAMIN) 1000 MCG tablet Take 1,000 mcg by mouth daily.   zinc gluconate 50 MG tablet Take 50 mg by mouth daily.   [DISCONTINUED] clobetasol cream (TEMOVATE) 0.05 % Apply to affected skin qd-bid prn flares, Avoid applying to face, groin, and axilla. Use as directed. Long-term use can cause thinning of the skin.   [DISCONTINUED] isosorbide mononitrate (IMDUR) 30 MG 24 hr tablet Take 30 mg by mouth daily.   [DISCONTINUED] losartan (COZAAR) 50 MG tablet Take 1 tablet (50 mg total) by mouth daily.   [DISCONTINUED] metoprolol tartrate (LOPRESSOR) 25 MG tablet Take 1 tablet (25 mg total) by mouth 2 (two) times daily.   [DISCONTINUED] oxybutynin (DITROPAN XL) 15 MG 24 hr tablet Take 1 tablet (15 mg total) by mouth at bedtime.   [DISCONTINUED] pravastatin (  PRAVACHOL) 40 MG tablet Take 1 tablet (40 mg total) by mouth daily.   [DISCONTINUED] zoledronic acid (RECLAST) 5 MG/100ML SOLN injection Inject 5 mg into the vein once.       02/17/2023    9:38 AM 08/21/2022    8:52 AM 02/13/2022    8:51 AM 09/06/2021    9:45 AM  GAD 7 : Generalized Anxiety Score  Nervous, Anxious, on Edge 0 0 0 0  Control/stop worrying 0 0 0 0  Worry too much - different things 0 0 0 0  Trouble relaxing 0 0 1 0  Restless 0 0 0 0  Easily annoyed or irritable 1 0 0 1  Afraid - awful might happen 0 0 0 0  Total GAD 7 Score 1 0 1 1  Anxiety Difficulty Not difficult at all Not difficult at all Somewhat difficult Not difficult at all       02/17/2023    9:38 AM 08/21/2022    8:52 AM 04/10/2022    9:31 AM  Depression screen PHQ 2/9  Decreased Interest 0 0 0  Down, Depressed, Hopeless 2 0 0  PHQ - 2 Score 2 0 0  Altered sleeping 0 0 0  Tired, decreased energy 2 1 0  Change in appetite 1 0 0  Feeling bad or failure about yourself  0 0 0  Trouble  concentrating 0 0 0  Moving slowly or fidgety/restless 0 3 0  Suicidal thoughts 0 0 0  PHQ-9 Score 5 4 0  Difficult doing work/chores Not difficult at all Not difficult at all Not difficult at all    BP Readings from Last 3 Encounters:  02/17/23 112/74  01/27/23 128/74  11/25/22 104/86    Physical Exam Vitals and nursing note reviewed.  Constitutional:      General: She is not in acute distress.    Appearance: She is well-developed.  HENT:     Head: Normocephalic and atraumatic.     Right Ear: Tympanic membrane and ear canal normal.     Left Ear: Tympanic membrane and ear canal normal.     Nose:     Right Sinus: No maxillary sinus tenderness.     Left Sinus: No maxillary sinus tenderness.  Eyes:     General: No scleral icterus.       Right eye: No discharge.        Left eye: No discharge.     Conjunctiva/sclera: Conjunctivae normal.  Neck:     Thyroid: No thyromegaly.     Vascular: No carotid bruit.  Cardiovascular:     Rate and Rhythm: Normal rate and regular rhythm.     Pulses: Normal pulses.     Heart sounds: Normal heart sounds.  Pulmonary:     Effort: Pulmonary effort is normal. No respiratory distress.     Breath sounds: No wheezing.  Chest:  Breasts:    Right: No mass, nipple discharge, skin change or tenderness.     Left: No mass, nipple discharge, skin change or tenderness.  Abdominal:     General: Bowel sounds are normal.     Palpations: Abdomen is soft.     Tenderness: There is no abdominal tenderness.  Musculoskeletal:     Cervical back: Normal range of motion. No erythema.     Right lower leg: No edema.     Left lower leg: No edema.  Lymphadenopathy:     Cervical: No cervical adenopathy.  Skin:    General: Skin is warm  and dry.     Findings: No rash.  Neurological:     Mental Status: She is alert and oriented to person, place, and time.     Cranial Nerves: No cranial nerve deficit.     Sensory: No sensory deficit.     Deep Tendon Reflexes:  Reflexes are normal and symmetric.  Psychiatric:        Attention and Perception: Attention normal.        Mood and Affect: Mood normal.     Wt Readings from Last 3 Encounters:  02/17/23 182 lb (82.6 kg)  01/27/23 181 lb (82.1 kg)  11/24/22 175 lb (79.4 kg)    BP 112/74   Pulse 64   Ht 5\' 1"  (1.549 m)   Wt 182 lb (82.6 kg)   SpO2 94%   BMI 34.39 kg/m   Assessment and Plan:  Problem List Items Addressed This Visit       Unprioritized   Prediabetes (Chronic)   Controlled with diet alone. Lab Results  Component Value Date   HGBA1C 6.2 (H) 02/13/2022         Relevant Orders   Comprehensive metabolic panel   Hemoglobin A1c   Postmenopausal osteoporosis (Chronic)   Followed by Endo; severe side effect to Reclast She will follow up in June for additional suggestions      Obstructive sleep apnea of adult   Good compliance with nightly Bipap      Mixed hyperlipidemia (Chronic)   LDL is  Lab Results  Component Value Date   LDLCALC 65 02/13/2022    Current regimen is pravastatin.  Tolerating medications well without issues.       Relevant Medications   losartan (COZAAR) 50 MG tablet   pravastatin (PRAVACHOL) 40 MG tablet   Other Relevant Orders   Lipid panel   Crohn's disease of small intestine without complication (HCC) (Chronic)   Urinary incontinence in female (Chronic)   On Ditropan with improvement in symptoms.       Relevant Medications   oxybutynin (DITROPAN XL) 15 MG 24 hr tablet   Atherosclerosis of abdominal aorta (HCC) (Chronic)   On statin and aspirin.      Relevant Medications   losartan (COZAAR) 50 MG tablet   pravastatin (PRAVACHOL) 40 MG tablet   Other Relevant Orders   Lipid panel   Essential hypertension - Primary (Chronic)   Controlled BP with normal exam. Current regimen is losartan. Will continue same medications; encourage continued reduced sodium diet.       Relevant Medications   losartan (COZAAR) 50 MG tablet    pravastatin (PRAVACHOL) 40 MG tablet   Other Relevant Orders   CBC with Differential/Platelet   Comprehensive metabolic panel   TSH   Urinalysis, Routine w reflex microscopic   Bronchiectasis without complication (HCC)   Followed by Pulmonary - Dr. Meredeth Ide On albuterol PRN - no recent chest infections.       Paroxysmal A-fib (HCC)   On Eliquis without bleeding No symptoms of Afib - monitor showed one 11 beat episode only.       Relevant Medications   losartan (COZAAR) 50 MG tablet   pravastatin (PRAVACHOL) 40 MG tablet   Other Visit Diagnoses       Colon cancer screening       scheduled for next month.     Need for influenza vaccination       Relevant Orders   Flu Vaccine Trivalent High Dose (Fluad) (Completed)  No follow-ups on file.    Reubin Milan, MD [facility] [place]

## 2023-02-17 NOTE — Assessment & Plan Note (Signed)
On Ditropan with improvement in symptoms.

## 2023-02-17 NOTE — Assessment & Plan Note (Signed)
On Eliquis without bleeding No symptoms of Afib - monitor showed one 11 beat episode only.

## 2023-02-17 NOTE — Assessment & Plan Note (Signed)
LDL is  Lab Results  Component Value Date   LDLCALC 65 02/13/2022    Current regimen is pravastatin.  Tolerating medications well without issues.

## 2023-02-17 NOTE — Assessment & Plan Note (Signed)
On statin and aspirin 

## 2023-02-17 NOTE — Assessment & Plan Note (Signed)
Controlled with diet alone. Lab Results  Component Value Date   HGBA1C 6.2 (H) 02/13/2022

## 2023-02-17 NOTE — Assessment & Plan Note (Addendum)
Followed by Endo; severe side effect to Reclast She will follow up in June for additional suggestions

## 2023-02-18 LAB — URINALYSIS, ROUTINE W REFLEX MICROSCOPIC
Bilirubin, UA: NEGATIVE
Glucose, UA: NEGATIVE
Ketones, UA: NEGATIVE
Nitrite, UA: NEGATIVE
Protein,UA: NEGATIVE
RBC, UA: NEGATIVE
Specific Gravity, UA: 1.017 (ref 1.005–1.030)
Urobilinogen, Ur: 0.2 mg/dL (ref 0.2–1.0)
pH, UA: 5.5 (ref 5.0–7.5)

## 2023-02-18 LAB — COMPREHENSIVE METABOLIC PANEL
ALT: 16 [IU]/L (ref 0–32)
AST: 17 [IU]/L (ref 0–40)
Albumin: 4.3 g/dL (ref 3.8–4.8)
Alkaline Phosphatase: 32 [IU]/L — ABNORMAL LOW (ref 44–121)
BUN/Creatinine Ratio: 29 — ABNORMAL HIGH (ref 12–28)
BUN: 26 mg/dL (ref 8–27)
Bilirubin Total: 0.4 mg/dL (ref 0.0–1.2)
CO2: 22 mmol/L (ref 20–29)
Calcium: 9.9 mg/dL (ref 8.7–10.3)
Chloride: 104 mmol/L (ref 96–106)
Creatinine, Ser: 0.91 mg/dL (ref 0.57–1.00)
Globulin, Total: 2.1 g/dL (ref 1.5–4.5)
Glucose: 93 mg/dL (ref 70–99)
Potassium: 4.4 mmol/L (ref 3.5–5.2)
Sodium: 140 mmol/L (ref 134–144)
Total Protein: 6.4 g/dL (ref 6.0–8.5)
eGFR: 65 mL/min/{1.73_m2} (ref >59)

## 2023-02-18 LAB — CBC WITH DIFFERENTIAL/PLATELET
Basophils Absolute: 0.1 10*3/uL (ref 0.0–0.2)
Basos: 1 %
EOS (ABSOLUTE): 0.3 10*3/uL (ref 0.0–0.4)
Eos: 4 %
Hematocrit: 42.3 % (ref 34.0–46.6)
Hemoglobin: 13.5 g/dL (ref 11.1–15.9)
Immature Grans (Abs): 0.1 10*3/uL (ref 0.0–0.1)
Immature Granulocytes: 1 %
Lymphocytes Absolute: 1.7 10*3/uL (ref 0.7–3.1)
Lymphs: 25 %
MCH: 28.5 pg (ref 26.6–33.0)
MCHC: 31.9 g/dL (ref 31.5–35.7)
MCV: 89 fL (ref 79–97)
Monocytes Absolute: 0.7 10*3/uL (ref 0.1–0.9)
Monocytes: 10 %
Neutrophils Absolute: 4 10*3/uL (ref 1.4–7.0)
Neutrophils: 59 %
Platelets: 233 10*3/uL (ref 150–450)
RBC: 4.73 x10E6/uL (ref 3.77–5.28)
RDW: 13.2 % (ref 11.7–15.4)
WBC: 6.7 10*3/uL (ref 3.4–10.8)

## 2023-02-18 LAB — MICROSCOPIC EXAMINATION
Casts: NONE SEEN /[LPF]
RBC, Urine: NONE SEEN /[HPF] (ref 0–2)

## 2023-02-18 LAB — HEMOGLOBIN A1C
Est. average glucose Bld gHb Est-mCnc: 126 mg/dL
Hgb A1c MFr Bld: 6 % — ABNORMAL HIGH (ref 4.8–5.6)

## 2023-02-18 LAB — LIPID PANEL
Chol/HDL Ratio: 3.1 {ratio} (ref 0.0–4.4)
Cholesterol, Total: 155 mg/dL (ref 100–199)
HDL: 50 mg/dL (ref >39)
LDL Chol Calc (NIH): 83 mg/dL (ref 0–99)
Triglycerides: 123 mg/dL (ref 0–149)
VLDL Cholesterol Cal: 22 mg/dL (ref 5–40)

## 2023-02-18 LAB — TSH: TSH: 2.34 u[IU]/mL (ref 0.450–4.500)

## 2023-03-13 ENCOUNTER — Encounter: Admission: RE | Payer: Self-pay | Source: Home / Self Care

## 2023-03-13 ENCOUNTER — Ambulatory Visit: Admission: RE | Admit: 2023-03-13 | Payer: Medicare Other | Source: Home / Self Care | Admitting: Gastroenterology

## 2023-03-13 SURGERY — COLONOSCOPY WITH PROPOFOL
Anesthesia: General

## 2023-03-26 ENCOUNTER — Encounter: Payer: Self-pay | Admitting: Internal Medicine

## 2023-03-26 ENCOUNTER — Other Ambulatory Visit: Payer: Self-pay

## 2023-03-26 ENCOUNTER — Ambulatory Visit
Admission: RE | Admit: 2023-03-26 | Discharge: 2023-03-26 | Disposition: A | Discharge status: Home / Self Care | Payer: Medicare Other | Attending: Internal Medicine | Admitting: Internal Medicine

## 2023-03-26 ENCOUNTER — Ambulatory Visit: Payer: Medicare Other | Admitting: Anesthesiology

## 2023-03-26 ENCOUNTER — Encounter
Admission: RE | Disposition: A | Discharge status: Home / Self Care | Payer: Self-pay | Source: Home / Self Care | Attending: Internal Medicine

## 2023-03-26 DIAGNOSIS — Z1211 Encounter for screening for malignant neoplasm of colon: Secondary | ICD-10-CM | POA: Diagnosis present

## 2023-03-26 DIAGNOSIS — K640 First degree hemorrhoids: Secondary | ICD-10-CM | POA: Diagnosis not present

## 2023-03-26 DIAGNOSIS — K5730 Diverticulosis of large intestine without perforation or abscess without bleeding: Secondary | ICD-10-CM | POA: Insufficient documentation

## 2023-03-26 DIAGNOSIS — I4891 Unspecified atrial fibrillation: Secondary | ICD-10-CM | POA: Insufficient documentation

## 2023-03-26 DIAGNOSIS — K523 Indeterminate colitis: Secondary | ICD-10-CM | POA: Diagnosis not present

## 2023-03-26 DIAGNOSIS — G4733 Obstructive sleep apnea (adult) (pediatric): Secondary | ICD-10-CM | POA: Insufficient documentation

## 2023-03-26 DIAGNOSIS — I209 Angina pectoris, unspecified: Secondary | ICD-10-CM | POA: Diagnosis not present

## 2023-03-26 DIAGNOSIS — K219 Gastro-esophageal reflux disease without esophagitis: Secondary | ICD-10-CM | POA: Insufficient documentation

## 2023-03-26 DIAGNOSIS — I10 Essential (primary) hypertension: Secondary | ICD-10-CM | POA: Diagnosis not present

## 2023-03-26 DIAGNOSIS — Z79899 Other long term (current) drug therapy: Secondary | ICD-10-CM | POA: Insufficient documentation

## 2023-03-26 DIAGNOSIS — K5010 Crohn's disease of large intestine without complications: Secondary | ICD-10-CM | POA: Insufficient documentation

## 2023-03-26 HISTORY — PX: COLONOSCOPY: SHX5424

## 2023-03-26 HISTORY — PX: BIOPSY: SHX5522

## 2023-03-26 SURGERY — COLONOSCOPY
Anesthesia: General

## 2023-03-26 MED ORDER — PROPOFOL 10 MG/ML IV BOLUS
INTRAVENOUS | Status: AC
  Filled 2023-03-26: qty 20

## 2023-03-26 MED ORDER — PROPOFOL 10 MG/ML IV BOLUS
INTRAVENOUS | Status: DC | PRN
  Administered 2023-03-26: 100 ug/kg/min via INTRAVENOUS
  Administered 2023-03-26: 50 mg via INTRAVENOUS
  Administered 2023-03-26: 40 mg via INTRAVENOUS

## 2023-03-26 MED ORDER — GLYCOPYRROLATE 0.2 MG/ML IJ SOLN
INTRAMUSCULAR | Status: DC | PRN
  Administered 2023-03-26: .2 mg via INTRAVENOUS

## 2023-03-26 MED ORDER — LIDOCAINE HCL (CARDIAC) PF 100 MG/5ML IV SOSY
PREFILLED_SYRINGE | INTRAVENOUS | Status: DC | PRN
  Administered 2023-03-26: 50 mg via INTRAVENOUS

## 2023-03-26 MED ORDER — LIDOCAINE HCL (PF) 2 % IJ SOLN
INTRAMUSCULAR | Status: AC
  Filled 2023-03-26: qty 5

## 2023-03-26 MED ORDER — SODIUM CHLORIDE 0.9 % IV SOLN: INTRAVENOUS | Status: DC

## 2023-03-26 NOTE — Anesthesia Preprocedure Evaluation (Signed)
Anesthesia Evaluation  Patient identified by MRN, date of birth, ID band Patient awake    Reviewed: Allergy & Precautions, NPO status , Patient's Chart, lab work & pertinent test results  History of Anesthesia Complications Negative for: history of anesthetic complications  Airway Mallampati: II  TM Distance: >3 FB Neck ROM: Full    Dental  (+) Edentulous Lower, Upper Dentures Upper dentures firmly bonded in place:   Pulmonary sleep apnea and Continuous Positive Airway Pressure Ventilation , neg COPD, Patient abstained from smoking.Not current smoker, former smoker   Pulmonary exam normal breath sounds clear to auscultation       Cardiovascular Exercise Tolerance: Good METShypertension, Pt. on medications + angina  (-) CAD and (-) Past MI Normal cardiovascular exam(-) dysrhythmias  Rhythm:Regular Rate:Normal - Systolic murmurs    Neuro/Psych negative neurological ROS  negative psych ROS   GI/Hepatic Neg liver ROS,GERD  Medicated and Controlled,,Crohn disease    Endo/Other  negative endocrine ROSneg diabetes    Renal/GU negative Renal ROS     Musculoskeletal  (+) Arthritis ,    Abdominal   Peds  Hematology HLD   Anesthesia Other Findings Past Medical History: No date: Arthritis No date: B12 deficiency No date: Bronchiectasis (HCC)     Comment:  pulmonology No date: Crohn's disease (HCC) No date: Diverticulosis No date: DJD (degenerative joint disease) 08/16/2019: Essential hypertension No date: Full dentures No date: GERD (gastroesophageal reflux disease)     Comment:  pt deneis  No date: Heart palpitations No date: Hypercholesterolemia No date: Hyperlipidemia No date: Incontinence of urine No date: Nocturnal leg cramps No date: Osteopenia No date: Osteoporosis No date: Reflux No date: Sleep apnea     Comment:  uses a cpap No date: Vertigo     Comment:  last episode 07/2021 No date: Wears glasses   Reproductive/Obstetrics                              Anesthesia Physical Anesthesia Plan  ASA: 2  Anesthesia Plan: General   Post-op Pain Management: Minimal or no pain anticipated   Induction: Intravenous  PONV Risk Score and Plan: 2 and Propofol infusion, TIVA and Ondansetron  Airway Management Planned: Nasal Cannula  Additional Equipment: None  Intra-op Plan:   Post-operative Plan:   Informed Consent: I have reviewed the patients History and Physical, chart, labs and discussed the procedure including the risks, benefits and alternatives for the proposed anesthesia with the patient or authorized representative who has indicated his/her understanding and acceptance.     Dental advisory given  Plan Discussed with: CRNA and Surgeon  Anesthesia Plan Comments: (Discussed risks of anesthesia with patient, including possibility of difficulty with spontaneous ventilation under anesthesia necessitating airway intervention, PONV, and rare risks such as cardiac or respiratory or neurological events, and allergic reactions. Discussed the role of CRNA in patient's perioperative care. Patient understands.)         Anesthesia Quick Evaluation

## 2023-03-26 NOTE — Op Note (Signed)
[facility] Gastroenterology Patient Name: Kimberly Andrade Procedure Date: 03/26/2023 1:48 PM MRN: [id number] Account #: [id number] Date of Birth: August 16, 1945 Admit Type: Outpatient Age: 78 Room: [facility] ENDO ROOM 2 Gender: Female Note Status: Finalized Instrument Name: Nelda Marseille [id number] Procedure:             Colonoscopy Indications:           High risk colon cancer surveillance: Inflammatory                         bowel disease (unclassified) of 8 (or more) years                         duration with one-third (or more) of the colon involved Providers:             Boykin Nearing. Norma Fredrickson MD, MD Referring MD:          Boykin Nearing. Norma Fredrickson MD, MD (Referring MD), Bari Edward, MD (Referring MD) Medicines:             Propofol per Anesthesia Complications:         No immediate complications. Estimated blood loss:                         Minimal. Procedure:             Pre-Anesthesia Assessment:                        - The risks and benefits of the procedure and the                         sedation options and risks were discussed with the                         patient. All questions were answered and informed                         consent was obtained.                        - Patient identification and proposed procedure were                         verified prior to the procedure by the nurse. The                         procedure was verified in the procedure room.                        - ASA Grade Assessment: III - A patient with severe                         systemic disease.                        - After reviewing the risks and benefits, the patient  was deemed in satisfactory condition to undergo the                         procedure.                        After obtaining informed consent, the colonoscope was                         passed under direct vision. Throughout the procedure,                          the patient's blood pressure, pulse, and oxygen                         saturations were monitored continuously. The                         Colonoscope was introduced through the anus and                         advanced to the the terminal ileum, with                         identification of the appendiceal orifice and IC                         valve. The colonoscopy was performed without                         difficulty. The patient tolerated the procedure well.                         The quality of the bowel preparation was good. The                         terminal ileum, ileocecal valve, appendiceal orifice,                         and rectum were photographed. Findings:      The perianal and digital rectal examinations were normal. Pertinent       negatives include normal sphincter tone and no palpable rectal lesions.      Non-bleeding internal hemorrhoids were found during retroflexion. The       hemorrhoids were Grade I (internal hemorrhoids that do not prolapse).      The terminal ileum appeared normal. Biopsies were taken with a cold       forceps for histology.      The colon (entire examined portion) appeared normal. Two biopsies were       obtained with cold forceps for Crohn's disease surveillance in the       cecum, as well as two biopsies in the ascending colon, two biopsies in       the transverse colon and two biopsies in the descending colon. Two       biopsies were obtained with cold forceps for Crohn's disease       surveillance in the sigmoid colon, as well as two biopsies in the       rectum. Estimated blood loss was minimal. Impression:            -  Non-bleeding internal hemorrhoids.                        - The examined portion of the ileum was normal.                         Biopsied.                        - The entire examined colon is normal.                        - Biopsies performed in the cecum, in the ascending                         colon, in  the transverse colon and in the descending                         colon.                        - Biopsies performed in the sigmoid colon and in the                         rectum. Recommendation:        - Patient has a contact number available for                         emergencies. The signs and symptoms of potential                         delayed complications were discussed with the patient.                         Return to normal activities tomorrow. Written                         discharge instructions were provided to the patient.                        - Resume previous diet.                        - Continue present medications.                        - IF all biopsies normal without dysplasia, would                         forego further endoscopic surveillance secondary to                         advanced age and comorbidities.                        - Return to GI office PRN.                        - The findings and recommendations were discussed with  the patient. Procedure Code(s):     --- Professional ---                        [phone], Colonoscopy, flexible; with biopsy, single or                         multiple Diagnosis Code(s):     --- Professional ---                        K64.0, First degree hemorrhoids                        K52.3, Indeterminate colitis CPT copyright 2022 [employer]. All rights reserved. The codes documented in this report are preliminary and upon coder review may  be revised to meet current compliance requirements. Stanton Kidney MD, MD 03/26/2023 2:18:22 PM This report has been signed electronically. Number of Addenda: 0 Note Initiated On: 03/26/2023 1:48 PM Scope Withdrawal Time: 0 hours 8 minutes 28 seconds  Total Procedure Duration: 0 hours 12 minutes 32 seconds  Estimated Blood Loss:  Estimated blood loss was minimal.      [facility]

## 2023-03-26 NOTE — H&P (Signed)
Outpatient short stay form Pre-procedure 03/26/2023 1:37 PM Kimberly Andrade K. Norma Andrade, [name]  Primary Physician: Bari Edward, [name]  Reason for visit:  Crohn's diesease  History of present illness:  Kimberly Andrade is a 78 y.o. female who presents to the [facility] for follow up. Pt was last seen in clinic 07/2022. PMHX of osteopenia, atrial fib, Crohn's disease, fibromyalgia, OSA on CPAP.  She has some bowel irregularity chronically, can have a bowel movement from every other day to twice a day. Does get some loud bowel sounds at times. Denies significant constipation, diarrhea, lower abdominal pain. No blood in stool. Last Crohn's flare up was 2015.   She continues to do well on Protonix 40 mg daily. She denies any nausea, vomiting, GERD. No dysphagia. Appetite good & weight stable. Has had some bloating intermittently in the past but currently seems better than prior symptoms. She had paraesophageal hernia repair in 2020.   Occasionally uses NSAIDs for pain but takes with food and it is rare. She denies any tobacco or alcohol intake.  Still takes Pentasa. 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 Entire colon normal. Pathology unremarkable. EGD 06/2019-fundoplication found, small HH, normal duodenum.    Current Facility-Administered Medications:    0.9 %  sodium chloride infusion, , Intravenous, Continuous, [place], Boykin Nearing, MD, Last Rate: 20 mL/hr at 03/26/23 1239, New Bag at  03/26/23 1239  Medications Prior to Admission  Medication Sig Dispense Refill Last Dose/Taking   albuterol (VENTOLIN HFA) 108 (90 Base) MCG/ACT inhaler SMARTSIG:2 Puff(s) By Mouth Every 6 Hours PRN      apixaban (ELIQUIS) 5 MG TABS tablet Take 1 tablet (5 mg total) by mouth 2 (two) times daily. 60 tablet 3 03/23/2023   Ascorbic Acid (VITAMIN C PO) Take 500 mg by mouth daily.   03/24/2023   aspirin EC 81 MG tablet Take 1 tablet (81 mg total) by mouth 2 (two) times daily after a meal. (Patient not taking: Reported on 03/26/2023) 30 tablet 0 Not Taking   Calcium Carbonate-Vit D-Min (CALCIUM 600+D3 PLUS MINERALS) 600-800 MG-UNIT TABS Take 1 tablet by mouth daily.   03/24/2023   cholecalciferol (VITAMIN D) 25 MCG (1000 UNIT) tablet Take 1,000 Units by mouth daily.   03/24/2023   ferrous sulfate 324 MG TBEC Take 324 mg by mouth daily with breakfast.   03/20/2023   losartan (COZAAR) 50 MG tablet Take 1 tablet (50 mg total) by mouth daily. 90 tablet 1 03/24/2023   Magnesium 250 MG TABS Take 500 mg by mouth daily.   03/24/2023   mesalamine (PENTASA) 500 MG CR capsule Take 2,000 mg by mouth 2 (two) times daily as needed (Crohn's).   03/24/2023   Multiple Vitamins-Minerals (MULTIVITAMIN WITH MINERALS) tablet Take 1 tablet by mouth daily.   03/24/2023   NON FORMULARY BiPap nightly      Omega-3 Fatty Acids (FISH OIL PO) Take 1,200 mg by mouth at bedtime.   03/24/2023   oxybutynin (DITROPAN XL) 15 MG 24 hr tablet Take 1 tablet (15  mg total) by mouth at bedtime. 90 tablet 1 03/24/2023   pantoprazole (PROTONIX) 40 MG tablet Take 40 mg by mouth every morning.   03/24/2023   Potassium 99 MG TABS Take 99 mg by mouth daily.    03/24/2023   pravastatin (PRAVACHOL) 40 MG tablet Take 1 tablet (40 mg total) by mouth daily. 90 tablet 1 03/24/2023   prednisoLONE acetate (PRED FORTE) 1 % ophthalmic suspension Place 1 drop into the right eye 2 (two) times daily. (Patient not taking: Reported on 03/26/2023)   Completed Course   pregabalin  (LYRICA) 25 MG capsule Take 25 mg by mouth. Patient taking 1 in the AM, 1 at Lunch, and 3 at night.   03/24/2023   pyridOXINE (VITAMIN B6) 100 MG tablet Take 100 mg by mouth daily.   03/24/2023   vitamin B-12 (CYANOCOBALAMIN) 1000 MCG tablet Take 1,000 mcg by mouth daily.   03/24/2023   zinc gluconate 50 MG tablet Take 50 mg by mouth daily.   03/24/2023     Allergies  Allergen Reactions   Fosamax [Alendronate Sodium] Other (See Comments)    Bones hurt   Lipitor [Atorvastatin] Other (See Comments)    Bone pain   Codeine Other (See Comments)     Codeine in the liquid form causes gastritis   Hydrocodone Nausea And Vomiting     Past Medical History:  Diagnosis Date   Arthritis    B12 deficiency    Bronchiectasis [facility])    pulmonology   Crohn's disease (HCC)    Diverticulosis    DJD (degenerative joint disease)    Essential hypertension 08/16/2019   Full dentures    GERD (gastroesophageal reflux disease)    pt deneis    Heart palpitations    Hypercholesterolemia    Hyperlipidemia    Incontinence of urine    Nocturnal leg cramps    Osteopenia    Osteoporosis    Reflux    Sleep apnea    uses a cpap   Vertigo    last episode 07/2021   Wears glasses     Review of systems:  Otherwise negative.    Physical Exam  Gen: Alert, oriented. Appears stated age.  HEENT: [place]/AT. PERRLA. Lungs: CTA, no wheezes. CV: RR nl S1, S2. Abd: soft, benign, no masses. BS+ Ext: No edema. Pulses 2+    Planned procedures: Proceed with colonoscopy. The patient understands the nature of the planned procedure, indications, risks, alternatives and potential complications including but not limited to bleeding, infection, perforation, damage to internal organs and possible oversedation/side effects from anesthesia. The patient agrees and gives consent to proceed.  Please refer to procedure notes for findings, recommendations and patient disposition/instructions.     Alann Avey K. Norma Andrade,  [name] Gastroenterology 03/26/2023  1:37 PM

## 2023-03-26 NOTE — Anesthesia Procedure Notes (Signed)
Procedure Name: MAC Date/Time: 03/26/2023 1:50 PM  Performed by: Lily Lovings, CRNAPre-anesthesia Checklist: Patient identified, Emergency Drugs available, Suction available and Patient being monitored Patient Re-evaluated:Patient Re-evaluated prior to induction Oxygen Delivery Method: Simple face mask Preoxygenation: Pre-oxygenation with 100% oxygen Induction Type: IV induction Comments: POM

## 2023-03-26 NOTE — Anesthesia Postprocedure Evaluation (Signed)
Anesthesia Post Note  Patient: Kimberly Andrade  Procedure(s) Performed: COLONOSCOPY BIOPSY  Patient location during evaluation: Endoscopy Anesthesia Type: General Level of consciousness: awake and alert Pain management: pain level controlled Vital Signs Assessment: post-procedure vital signs reviewed and stable Respiratory status: spontaneous breathing, nonlabored ventilation, respiratory function stable and patient connected to nasal cannula oxygen Cardiovascular status: blood pressure returned to baseline and stable Postop Assessment: no apparent nausea or vomiting Anesthetic complications: no   No notable events documented.   Last Vitals:  Vitals:   03/26/23 1412 03/26/23 1414  BP: 101/64 101/64  Pulse: 64 68  Resp: 17 15  Temp: (!) 35.9 C   SpO2: 94% 98%    Last Pain:  Vitals:   03/26/23 1414  TempSrc:   PainSc: 0-No pain                 Corinda Gubler

## 2023-03-26 NOTE — Interval H&P Note (Signed)
History and Physical Interval Note:  03/26/2023 1:39 PM  Kimberly Andrade  has presented today for surgery, with the diagnosis of K50.90 (ICD-10-CM) - Crohn's disease without complication, unspecified gastrointestinal tract location.  The various methods of treatment have been discussed with the patient and family. After consideration of risks, benefits and other options for treatment, the patient has consented to  Procedure(s): COLONOSCOPY (N/A) as a surgical intervention.  The patient's history has been reviewed, patient examined, no change in status, stable for surgery.  I have reviewed the patient's chart and labs.  Questions were answered to the patient's satisfaction.     [place], [place]

## 2023-03-26 NOTE — Transfer of Care (Signed)
Immediate Anesthesia Transfer of Care Note  Patient: Kimberly Andrade  Procedure(s) Performed: COLONOSCOPY BIOPSY  Patient Location: Endoscopy Unit  Anesthesia Type:General  Level of Consciousness: awake, drowsy, and patient cooperative  Airway & Oxygen Therapy: Patient Spontanous Breathing  Post-op Assessment: Report given to RN and Post -op Vital signs reviewed and stable  Post vital signs: Reviewed and stable  Last Vitals:  Vitals Value Taken Time  BP 101/64 03/26/23 1414  Temp 35.9 C 03/26/23 1412  Pulse 68 03/26/23 1414  Resp 20 03/26/23 1414  SpO2 94 % 03/26/23 1414  Vitals shown include unfiled device data.  Last Pain:  Vitals:   03/26/23 1414  TempSrc:   PainSc: 0-No pain         Complications: No notable events documented.

## 2023-03-27 ENCOUNTER — Encounter: Payer: Self-pay | Admitting: Internal Medicine

## 2023-03-27 LAB — SURGICAL PATHOLOGY

## 2023-03-28 ENCOUNTER — Encounter: Payer: Self-pay | Admitting: Internal Medicine

## 2023-03-29 ENCOUNTER — Encounter: Payer: Self-pay | Admitting: Internal Medicine

## 2023-04-04 ENCOUNTER — Telehealth: Payer: Self-pay | Admitting: Internal Medicine

## 2023-04-04 NOTE — Telephone Encounter (Signed)
Spoke to patient about coming in, suggest she see another provider she refused. She wants to wait til Monday to see how she feels.

## 2023-04-07 ENCOUNTER — Encounter: Payer: Self-pay | Admitting: Physician Assistant

## 2023-04-07 ENCOUNTER — Ambulatory Visit: Payer: Medicare Other | Admitting: Physician Assistant

## 2023-04-07 VITALS — BP 136/84 | HR 99 | Temp 98.6°F | Ht 61.0 in | Wt 185.0 lb

## 2023-04-07 DIAGNOSIS — J209 Acute bronchitis, unspecified: Secondary | ICD-10-CM | POA: Diagnosis not present

## 2023-04-07 DIAGNOSIS — I480 Paroxysmal atrial fibrillation: Secondary | ICD-10-CM

## 2023-04-07 MED ORDER — AZITHROMYCIN 250 MG PO TABS: ORAL_TABLET | ORAL | 0 refills | Status: AC

## 2023-04-07 MED ORDER — BENZONATATE 200 MG PO CAPS: 200.0000 mg | ORAL_CAPSULE | Freq: Three times a day (TID) | ORAL | 0 refills | Status: DC | PRN

## 2023-04-07 MED ORDER — PROMETHAZINE-DM 6.25-15 MG/5ML PO SYRP: 5.0000 mL | ORAL_SOLUTION | Freq: Four times a day (QID) | ORAL | 0 refills | Status: AC | PRN

## 2023-04-07 NOTE — Progress Notes (Signed)
Date:  04/07/2023   Name:  Kimberly Andrade   DOB:  1945-05-22   MRN:  [id number]   Chief Complaint: Sinus Problem  Sinus Problem This is a new problem. Episode onset: X1 week. Associated symptoms include coughing. (With mucous )   Kimberly Andrade is a pleasant 78 year old female who typically sees my colleague Dr. Bari Edward, here for evaluation of URI symptoms for the past 1 week, the most bothersome of which is a persistent cough which does not seem to be improving.  She is using Mucinex at home, and taking a cough syrup previously prescribed by Dr. Judithann Graves (Promethazine-DM).  No known sick contacts.   Medication list has been reviewed and updated.  Current Meds  Medication Sig   apixaban (ELIQUIS) 5 MG TABS tablet Take 1 tablet (5 mg total) by mouth 2 (two) times daily.   Ascorbic Acid (VITAMIN C PO) Take 500 mg by mouth daily.   azithromycin (ZITHROMAX) 250 MG tablet Take 2 tablets on day 1, then 1 tablet daily on days 2 through 5   benzonatate (TESSALON) 200 MG capsule Take 1 capsule (200 mg total) by mouth 3 (three) times daily as needed for cough.   Calcium Carbonate-Vit D-Min (CALCIUM 600+D3 PLUS MINERALS) 600-800 MG-UNIT TABS Take 1 tablet by mouth daily.   cholecalciferol (VITAMIN D) 25 MCG (1000 UNIT) tablet Take 1,000 Units by mouth daily.   ferrous sulfate 324 MG TBEC Take 324 mg by mouth daily with breakfast.   losartan (COZAAR) 50 MG tablet Take 1 tablet (50 mg total) by mouth daily.   Magnesium 250 MG TABS Take 500 mg by mouth daily.   mesalamine (PENTASA) 500 MG CR capsule Take 2,000 mg by mouth 2 (two) times daily as needed (Crohn's).   Multiple Vitamins-Minerals (MULTIVITAMIN WITH MINERALS) tablet Take 1 tablet by mouth daily.   NON FORMULARY BiPap nightly   Omega-3 Fatty Acids (FISH OIL PO) Take 1,200 mg by mouth at bedtime.   oxybutynin (DITROPAN XL) 15 MG 24 hr tablet Take 1 tablet (15 mg total) by mouth at bedtime.   pantoprazole (PROTONIX) 40 MG tablet Take  40 mg by mouth every morning.   Potassium 99 MG TABS Take 99 mg by mouth daily.    pravastatin (PRAVACHOL) 40 MG tablet Take 1 tablet (40 mg total) by mouth daily.   pregabalin (LYRICA) 25 MG capsule Take 25 mg by mouth. Patient taking 1 in the AM, 1 at Lunch, and 3 at night.   pyridOXINE (VITAMIN B6) 100 MG tablet Take 100 mg by mouth daily.   vitamin B-12 (CYANOCOBALAMIN) 1000 MCG tablet Take 1,000 mcg by mouth daily.   zinc gluconate 50 MG tablet Take 50 mg by mouth daily.   [DISCONTINUED] aspirin EC 81 MG tablet Take 1 tablet (81 mg total) by mouth 2 (two) times daily after a meal.     Review of Systems  Respiratory:  Positive for cough.     Patient Active Problem List   Diagnosis Date Noted   Paroxysmal A-fib (HCC) 01/27/2023   Polyneuropathy 08/21/2022   Bronchiectasis without complication (HCC) 02/13/2022   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   Lymphedema 03/30/2021   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   Essential hypertension 08/16/2019   Morton's neuroma 07/27/2019   Atherosclerosis of abdominal aorta (HCC) 03/16/2018   DDD (degenerative disc disease), 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/2017   Prediabetes 12/25/2016   Nocturnal leg cramps 12/25/2016   Postmenopausal osteoporosis 10/16/2016   Urinary incontinence in female 10/16/2016  Mixed hyperlipidemia 10/05/2015   GERD (gastroesophageal reflux disease) 04/24/2015   Crohn's disease of small intestine without complication (HCC) 04/24/2015   Obstructive sleep apnea of adult 11/18/2013    Allergies  Allergen Reactions   Fosamax [Alendronate Sodium] Other (See Comments)    Bones hurt   Lipitor [Atorvastatin] Other (See Comments)    Bone pain   Codeine Other (See Comments)     Codeine in the liquid form causes gastritis   Hydrocodone Nausea And Vomiting    Immunization History  Administered Date(s) Administered   Fluad Quad(high Dose 65+) 12/11/2018, 02/16/2020, 02/19/2021, 02/13/2022   Fluad Trivalent(High Dose 65+) 02/17/2023    Fluzone Influenza virus vaccine,trivalent (IIV3), split virus 01/22/2012   Influenza, High Dose Seasonal PF 11/27/2017   Influenza, Seasonal, Injecte, Preservative Fre 11/27/2017   PNEUMOCOCCAL CONJUGATE-20 02/13/2022   Pneumococcal Conjugate-13 06/29/2015   Pneumococcal Polysaccharide-23 04/29/2011   Tdap 06/29/2015, 07/10/2016   Zoster Recombinant(Shingrix) 10/19/2020, 01/19/2021   Zoster, Live 02/01/2014    Past Surgical History:  Procedure Laterality Date   ABDOMINAL HYSTERECTOMY     APPENDECTOMY     BIOPSY  03/26/2023   Procedure: BIOPSY;  Surgeon: Norma Fredrickson, Boykin Nearing, MD;  Location: [facility] ENDOSCOPY;  Service: Gastroenterology;;   Kimberly Andrade     CATARACT EXTRACTION W/PHACO Right 05/28/2022   Procedure: CATARACT EXTRACTION PHACO AND INTRAOCULAR LENS PLACEMENT (IOC) RIGHT  5.21  00:37.2;  Surgeon: Galen Manila, MD;  Location: [facility];  Service: Ophthalmology;  Laterality: Right;  sleep apnea   CATARACT EXTRACTION W/PHACO Left 06/18/2022   Procedure: CATARACT EXTRACTION PHACO AND INTRAOCULAR LENS PLACEMENT (IOC) LEFT 5.64 00:39.4;  Surgeon: Galen Manila, MD;  Location: [facility];  Service: Ophthalmology;  Laterality: Left;  sleep apnea   CHOLECYSTECTOMY     COLONOSCOPY N/A 03/26/2023   Procedure: COLONOSCOPY;  Surgeon: Toledo, Boykin Nearing, MD;  Location: [facility] ENDOSCOPY;  Service: Gastroenterology;  Laterality: N/A;   COLONOSCOPY WITH PROPOFOL N/A 03/11/2016   Procedure: COLONOSCOPY WITH PROPOFOL;  Surgeon: Christena Deem, MD;  Location: [facility] ENDOSCOPY;  Service: Endoscopy;  Laterality: N/A;   DILATION AND CURETTAGE OF UTERUS     ESOPHAGOGASTRODUODENOSCOPY N/A 03/11/2016   Procedure: ESOPHAGOGASTRODUODENOSCOPY (EGD);  Surgeon: Christena Deem, MD;  Location: [facility] ENDOSCOPY;  Service: Endoscopy;  Laterality: N/A;   ESOPHAGOGASTRODUODENOSCOPY (EGD) WITH PROPOFOL N/A 11/11/2017   Procedure: ESOPHAGOGASTRODUODENOSCOPY (EGD) WITH PROPOFOL;  Surgeon:  Christena Deem, MD;  Location: [facility] ENDOSCOPY;  Service: Endoscopy;  Laterality: N/A;   ESOPHAGOGASTRODUODENOSCOPY (EGD) WITH PROPOFOL N/A 01/20/2018   Procedure: ESOPHAGOGASTRODUODENOSCOPY (EGD) WITH PROPOFOL;  Surgeon: Christena Deem, MD;  Location: [facility] ENDOSCOPY;  Service: Endoscopy;  Laterality: N/A;   FOOT SURGERY Right    HAMMER TOE SURGERY     JOINT REPLACEMENT Right 2006   shoulder   KNEE ARTHROSCOPY Right 05/25/2013   Procedure: ARTHROSCOPY KNEE;  Surgeon: Velna Ochs, MD;  Location: [facility];  Service: Orthopedics;  Laterality: Right;  medial and lateral meniscal debridment and chondroplasty   RADIOFREQUENCY ABLATION NERVES Bilateral 07/2022   lumbar spine   righti shoulder replacement      ROBOTIC ASSISTED LAPAROSCOPIC REPAIR OF PARAESOPHAGEAL HERNIA  07/16/2018   SHOULDER ARTHROSCOPY WITH ROTATOR CUFF REPAIR AND SUBACROMIAL DECOMPRESSION Left 08/14/2017   Procedure: SHOULDER ARTHROSCOPY WITH ROTATOR CUFF REPAIR AND SUBACROMIAL DECOMPRESSION;  Surgeon: Jones Broom, MD;  Location: [facility] OR;  Service: Orthopedics;  Laterality: Left;   SHOULDER SURGERY Right    X 3   TOTAL HIP ARTHROPLASTY  Left 11/25/2017   Procedure: LEFT TOTAL HIP ARTHROPLASTY ANTERIOR APPROACH;  Surgeon: Marcene Corning, MD;  Location: [facility] OR;  Service: Orthopedics;  Laterality: Left;   TOTAL KNEE ARTHROPLASTY Right 04/18/2020   Procedure: RIGHT TOTAL KNEE ARTHROPLASTY;  Surgeon: Marcene Corning, MD;  Location: [facility] ORS;  Service: Orthopedics;  Laterality: Right;    Social History   Tobacco Use   Smoking status: Former    Current packs/day: 0.00    Types: Cigarettes    Quit date: 12/29/2001    Years since quitting: 21.2   Smokeless tobacco: Never  Vaping Use   Vaping status: Never Used  Substance Use Topics   Alcohol use: No   Drug use: No    Family History  Problem Relation Age of Onset   Heart disease Mother    Esophageal cancer Father         04/07/2023    9:18  AM 02/17/2023    9:38 AM 08/21/2022    8:52 AM 02/13/2022    8:51 AM  GAD 7 : Generalized Anxiety Score  Nervous, Anxious, on Edge 0 0 0 0  Control/stop worrying 0 0 0 0  Worry too much - different things 0 0 0 0  Trouble relaxing 0 0 0 1  Restless 0 0 0 0  Easily annoyed or irritable 1 1 0 0  Afraid - awful might happen 0 0 0 0  Total GAD 7 Score 1 1 0 1  Anxiety Difficulty Not difficult at all Not difficult at all Not difficult at all Somewhat difficult       04/07/2023    9:18 AM 02/17/2023    9:38 AM 08/21/2022    8:52 AM  Depression screen PHQ 2/9  Decreased Interest 0 0 0  Down, Depressed, Hopeless 0 2 0  PHQ - 2 Score 0 2 0  Altered sleeping  0 0  Tired, decreased energy  2 1  Change in appetite  1 0  Feeling bad or failure about yourself   0 0  Trouble concentrating  0 0  Moving slowly or fidgety/restless  0 3  Suicidal thoughts  0 0  PHQ-9 Score  5 4  Difficult doing work/chores  Not difficult at all Not difficult at all    BP Readings from Last 3 Encounters:  04/07/23 136/84  03/26/23 100/86  02/17/23 112/74    Wt Readings from Last 3 Encounters:  04/07/23 185 lb (83.9 kg)  03/26/23 179 lb 9.6 oz (81.5 kg)  02/17/23 182 lb (82.6 kg)    BP 136/84   Pulse 99   Temp 98.6 F (37 C)   Ht 5\' 1"  (1.549 m)   Wt 185 lb (83.9 kg)   SpO2 95%   BMI 34.96 kg/m   Physical Exam Vitals and nursing note reviewed.  Constitutional:      General: She is not in acute distress.    Appearance: Normal appearance.  HENT:     Right Ear: Tympanic membrane normal.     Left Ear: Tympanic membrane normal.     Ears:     Comments: EAC clear bilaterally with good view of TM which is without effusion or erythema.     Nose: Nose normal.     Comments: Sinuses nontender    Mouth/Throat:     Mouth: Mucous membranes are moist.     Pharynx: No oropharyngeal exudate or posterior oropharyngeal erythema.  Eyes:     Conjunctiva/sclera: Conjunctivae normal.     Pupils:  Pupils are  equal, round, and reactive to light.  Cardiovascular:     Rate and Rhythm: Tachycardia present. Rhythm irregularly irregular.     Heart sounds: No murmur heard.    No friction rub. No gallop.  Pulmonary:     Effort: Pulmonary effort is normal.     Breath sounds: Decreased breath sounds present. No wheezing, rhonchi or rales.  Lymphadenopathy:     Cervical: No cervical adenopathy.     Recent Labs     Component Value Date/Time   NA 140 02/17/2023 1035   K 4.4 02/17/2023 1035   CL 104 02/17/2023 1035   CO2 22 02/17/2023 1035   GLUCOSE 93 02/17/2023 1035   GLUCOSE 132 (H) 11/24/2022 2331   BUN 26 02/17/2023 1035   CREATININE 0.91 02/17/2023 1035   CALCIUM 9.9 02/17/2023 1035   PROT 6.4 02/17/2023 1035   ALBUMIN 4.3 02/17/2023 1035   AST 17 02/17/2023 1035   ALT 16 02/17/2023 1035   ALKPHOS 32 (L) 02/17/2023 1035   BILITOT 0.4 02/17/2023 1035   GFRNONAA >60 11/24/2022 2331   GFRAA 76 02/16/2020 1017    Lab Results  Component Value Date   WBC 6.7 02/17/2023   HGB 13.5 02/17/2023   HCT 42.3 02/17/2023   MCV 89 02/17/2023   PLT 233 02/17/2023   Lab Results  Component Value Date   HGBA1C 6.0 (H) 02/17/2023   Lab Results  Component Value Date   CHOL 155 02/17/2023   HDL 50 02/17/2023   LDLCALC 83 02/17/2023   TRIG 123 02/17/2023   CHOLHDL 3.1 02/17/2023   Lab Results  Component Value Date   TSH 2.340 02/17/2023     Assessment and Plan:  1. Acute bronchitis, unspecified organism (Primary) Will cover for pneumonia with azithromycin.  Refill Promethazine DM, add benzonatate for symptomatic control.   - azithromycin (ZITHROMAX) 250 MG tablet; Take 2 tablets on day 1, then 1 tablet daily on days 2 through 5  Dispense: 6 tablet; Refill: 0 - promethazine-dextromethorphan (PROMETHAZINE-DM) 6.25-15 MG/5ML syrup; Take 5 mLs by mouth 4 (four) times daily as needed for up to 9 days for cough.  Dispense: 118 mL; Refill: 0 - benzonatate (TESSALON) 200 MG capsule; Take 1  capsule (200 mg total) by mouth 3 (three) times daily as needed for cough.  Dispense: 30 capsule; Refill: 0  2. Paroxysmal A-fib [facility]) Patient seems to be in A-fib today.  No active chest pain.  Patient advised we may repeat EKG in the near future.  F/u PRN   Alvester Morin, PA-C, DMSc, Nutritionist [facility] [phone]

## 2023-04-16 ENCOUNTER — Ambulatory Visit: Payer: Medicare Other

## 2023-04-16 DIAGNOSIS — Z0000 Encounter for general adult medical examination without abnormal findings: Secondary | ICD-10-CM

## 2023-04-16 NOTE — Patient Instructions (Addendum)
Kimberly Andrade , Thank you for taking time to come for your Medicare Wellness Visit. I appreciate your ongoing commitment to your health goals. Please review the following plan we discussed and let me know if I can assist you in the future.   Referrals/Orders/Follow-Ups/Clinician Recommendations: none  This is a list of the screening recommended for you and due dates:  Health Maintenance  Topic Date Due   Mammogram  09/30/2023   Medicare Annual Wellness Visit  04/15/2024   Colon Cancer Screening  03/25/2026   DTaP/Tdap/Td vaccine (3 - Td or Tdap) 07/11/2026   Pneumonia Vaccine  Completed   Flu Shot  Completed   DEXA scan (bone density measurement)  Completed   Hepatitis C Screening  Completed   Zoster (Shingles) Vaccine  Completed   HPV Vaccine  Aged Out   COVID-19 Vaccine  Discontinued    Advanced directives: (ACP Link)Information on Advanced Care Planning can be found at [facility] Advance Health Care Directives Advance Health Care Directives ([email or web address]   Next Medicare Annual Wellness Visit scheduled for next year: Yes   04/21/24 @ 8:50 AM BY PHONE

## 2023-04-16 NOTE — Progress Notes (Signed)
Subjective:   Kimberly Andrade is a 78 y.o. female who presents for Medicare Annual (Subsequent) preventive examination.  Visit Complete: Virtual I connected with  Kimberly Andrade on 04/16/23 by a audio enabled telemedicine application and verified that I am speaking with the correct person using two identifiers.  This patient declined Interactive audio and Acupuncturist. Therefore the visit was completed with audio only.   Patient Location: Home  Provider Location: Office/Clinic  I discussed the limitations of evaluation and management by telemedicine. The patient expressed understanding and agreed to proceed.  Vital Signs: Because this visit was a virtual/telehealth visit, some criteria may be missing or patient reported. Any vitals not documented were not able to be obtained and vitals that have been documented are patient reported.  Cardiac Risk Factors include: advanced age (>55men, >106 women);dyslipidemia;hypertension;obesity (BMI >30kg/m2)     Objective:    Today's Vitals   04/16/23 0933  PainSc: 0-No pain   There is no height or weight on file to calculate BMI.     04/16/2023    9:38 AM 03/26/2023   12:21 PM 11/24/2022   11:28 PM 06/18/2022    9:22 AM 05/28/2022    9:26 AM 04/10/2022    9:32 AM 04/19/2021   12:01 AM  Advanced Directives  Does Patient Have a Medical Advance Directive? No No No Yes No No No  Type of Theme park manager;Living will     Does patient want to make changes to medical advance directive?    No - Patient declined     Copy of Healthcare Power of Attorney in Chart?    Yes - validated most recent copy scanned in chart (See row information)     Would patient like information on creating a medical advance directive? No - Patient declined No - Patient declined No - Patient declined  Yes (MAU/Ambulatory/Procedural Areas - Information given) No - Patient declined No - Patient declined    Current  Medications (verified) Outpatient Encounter Medications as of 04/16/2023  Medication Sig   Ascorbic Acid (VITAMIN C PO) Take 500 mg by mouth daily.   benzonatate (TESSALON) 200 MG capsule Take 1 capsule (200 mg total) by mouth 3 (three) times daily as needed for cough.   Calcium Carbonate-Vit D-Min (CALCIUM 600+D3 PLUS MINERALS) 600-800 MG-UNIT TABS Take 1 tablet by mouth daily.   cholecalciferol (VITAMIN D) 25 MCG (1000 UNIT) tablet Take 1,000 Units by mouth daily.   ferrous sulfate 324 MG TBEC Take 324 mg by mouth daily with breakfast.   losartan (COZAAR) 50 MG tablet Take 1 tablet (50 mg total) by mouth daily.   Magnesium 250 MG TABS Take 500 mg by mouth daily.   mesalamine (PENTASA) 500 MG CR capsule Take 2,000 mg by mouth 2 (two) times daily as needed (Crohn's).   Multiple Vitamins-Minerals (MULTIVITAMIN WITH MINERALS) tablet Take 1 tablet by mouth daily.   NON FORMULARY BiPap nightly   Omega-3 Fatty Acids (FISH OIL PO) Take 1,200 mg by mouth at bedtime.   oxybutynin (DITROPAN XL) 15 MG 24 hr tablet Take 1 tablet (15 mg total) by mouth at bedtime.   pantoprazole (PROTONIX) 40 MG tablet Take 40 mg by mouth every morning.   Potassium 99 MG TABS Take 99 mg by mouth daily.    pravastatin (PRAVACHOL) 40 MG tablet Take 1 tablet (40 mg total) by mouth daily.   pregabalin (LYRICA) 25 MG capsule Take 25 mg by mouth. Patient  taking 1 in the AM, 1 at Lunch, and 3 at night.   promethazine-dextromethorphan (PROMETHAZINE-DM) 6.25-15 MG/5ML syrup Take 5 mLs by mouth 4 (four) times daily as needed for up to 9 days for cough.   pyridOXINE (VITAMIN B6) 100 MG tablet Take 100 mg by mouth daily.   vitamin B-12 (CYANOCOBALAMIN) 1000 MCG tablet Take 1,000 mcg by mouth daily.   zinc gluconate 50 MG tablet Take 50 mg by mouth daily.   albuterol (VENTOLIN HFA) 108 (90 Base) MCG/ACT inhaler SMARTSIG:2 Puff(s) By Mouth Every 6 Hours PRN (Patient not taking: Reported on 04/16/2023)   apixaban (ELIQUIS) 5 MG TABS  tablet Take 1 tablet (5 mg total) by mouth 2 (two) times daily.   No facility-administered encounter medications on file as of 04/16/2023.    Allergies (verified) Fosamax [alendronate sodium], Lipitor [atorvastatin], Codeine, and Hydrocodone   History: Past Medical History:  Diagnosis Date   Arthritis    B12 deficiency    Bronchiectasis [facility])    pulmonology   Crohn's disease (HCC)    Diverticulosis    DJD (degenerative joint disease)    Essential hypertension 08/16/2019   Full dentures    GERD (gastroesophageal reflux disease)    pt deneis    Heart palpitations    Hypercholesterolemia    Hyperlipidemia    Incontinence of urine    Nocturnal leg cramps    Osteopenia    Osteoporosis    Reflux    Sleep apnea    uses a cpap   Vertigo    last episode 07/2021   Wears glasses    Past Surgical History:  Procedure Laterality Date   ABDOMINAL HYSTERECTOMY     APPENDECTOMY     BIOPSY  03/26/2023   Procedure: BIOPSY;  Surgeon: Norma Fredrickson, Boykin Nearing, MD;  Location: [facility] ENDOSCOPY;  Service: Gastroenterology;;   Dierdre Searles     CATARACT EXTRACTION W/PHACO Right 05/28/2022   Procedure: CATARACT EXTRACTION PHACO AND INTRAOCULAR LENS PLACEMENT (IOC) RIGHT  5.21  00:37.2;  Surgeon: Galen Manila, MD;  Location: [facility]GERY CNTR;  Service: Ophthalmology;  Laterality: Right;  sleep apnea   CATARACT EXTRACTION W/PHACO Left 06/18/2022   Procedure: CATARACT EXTRACTION PHACO AND INTRAOCULAR LENS PLACEMENT (IOC) LEFT 5.64 00:39.4;  Surgeon: Galen Manila, MD;  Location: [facility] SURGERY [facility];  Service: Ophthalmology;  Laterality: Left;  sleep apnea   CHOLECYSTECTOMY     COLONOSCOPY N/A 03/26/2023   Procedure: COLONOSCOPY;  Surgeon: Toledo, Boykin Nearing, MD;  Location: [facility] ENDOSCOPY;  Service: Gastroenterology;  Laterality: N/A;   COLONOSCOPY WITH PROPOFOL N/A 03/11/2016   Procedure: COLONOSCOPY WITH PROPOFOL;  Surgeon: Christena Deem, MD;  Location: [facility] ENDOSCOPY;  Service: Endoscopy;   Laterality: N/A;   DILATION AND CURETTAGE OF UTERUS     ESOPHAGOGASTRODUODENOSCOPY N/A 03/11/2016   Procedure: ESOPHAGOGASTRODUODENOSCOPY (EGD);  Surgeon: Christena Deem, MD;  Location: [facility] ENDOSCOPY;  Service: Endoscopy;  Laterality: N/A;   ESOPHAGOGASTRODUODENOSCOPY (EGD) WITH PROPOFOL N/A 11/11/2017   Procedure: ESOPHAGOGASTRODUODENOSCOPY (EGD) WITH PROPOFOL;  Surgeon: Christena Deem, MD;  Location: [facility] ENDOSCOPY;  Service: Endoscopy;  Laterality: N/A;   ESOPHAGOGASTRODUODENOSCOPY (EGD) WITH PROPOFOL N/A 01/20/2018   Procedure: ESOPHAGOGASTRODUODENOSCOPY (EGD) WITH PROPOFOL;  Surgeon: Christena Deem, MD;  Location: [facility] ENDOSCOPY;  Service: Endoscopy;  Laterality: N/A;   FOOT SURGERY Right    HAMMER TOE SURGERY     JOINT REPLACEMENT Right 2006   shoulder   KNEE ARTHROSCOPY Right 05/25/2013   Procedure: ARTHROSCOPY KNEE;  Surgeon: Velna Ochs, MD;  Location: [facility];  Service: Orthopedics;  Laterality: Right;  medial and lateral meniscal debridment and chondroplasty   RADIOFREQUENCY ABLATION NERVES Bilateral 07/2022   lumbar spine   righti shoulder replacement      ROBOTIC ASSISTED LAPAROSCOPIC REPAIR OF PARAESOPHAGEAL HERNIA  07/16/2018   SHOULDER ARTHROSCOPY WITH ROTATOR CUFF REPAIR AND SUBACROMIAL DECOMPRESSION Left 08/14/2017   Procedure: SHOULDER ARTHROSCOPY WITH ROTATOR CUFF REPAIR AND SUBACROMIAL DECOMPRESSION;  Surgeon: Jones Broom, MD;  Location: [facility] OR;  Service: Orthopedics;  Laterality: Left;   SHOULDER SURGERY Right    X 3   TOTAL HIP ARTHROPLASTY Left 11/25/2017   Procedure: LEFT TOTAL HIP ARTHROPLASTY ANTERIOR APPROACH;  Surgeon: Marcene Corning, MD;  Location: [facility] OR;  Service: Orthopedics;  Laterality: Left;   TOTAL KNEE ARTHROPLASTY Right 04/18/2020   Procedure: RIGHT TOTAL KNEE ARTHROPLASTY;  Surgeon: Marcene Corning, MD;  Location: [facility] ORS;  Service: Orthopedics;  Laterality: Right;   Family History  Problem Relation Age of Onset    Heart disease Mother    Esophageal cancer Father    Social History   Socioeconomic History   Marital status: Married    Spouse name: Not on file   Number of children: 3   Years of education: Not on file   Highest education level: Associate degree: academic program  Occupational History   Occupation: retired  Tobacco Use   Smoking status: Former    Current packs/day: 0.00    Types: Cigarettes    Quit date: 12/29/2001    Years since quitting: 21.3   Smokeless tobacco: Never  Vaping Use   Vaping status: Never Used  Substance and Sexual Activity   Alcohol use: No   Drug use: No   Sexual activity: Not on file  Other Topics Concern   Not on file  Social History Narrative   Not on file   Social Drivers of Health   Financial Resource Strain: Low Risk  (04/16/2023)   Overall Financial Resource Strain (CARDIA)    Difficulty of Paying Living Expenses: Not hard at all  Food Insecurity: No Food Insecurity (04/16/2023)   Hunger Vital Sign    Worried About Running Out of Food in the Last Year: Never true    Ran Out of Food in the Last Year: Never true  Transportation Needs: No Transportation Needs (04/16/2023)   PRAPARE - Administrator, Civil Service (Medical): No    Lack of Transportation (Non-Medical): No  Physical Activity: Insufficiently Active (04/16/2023)   Exercise Vital Sign    Days of Exercise per Week: 3 days    Minutes of Exercise per Session: 30 min  Stress: No Stress Concern Present (04/16/2023)   [employer] of Occupational Health - Occupational Stress Questionnaire    Feeling of Stress : Not at all  Social Connections: Moderately Integrated (04/16/2023)   Social Connection and Isolation Panel [NHANES]    Frequency of Communication with Friends and Family: More than three times a week    Frequency of Social Gatherings with Friends and Family: Twice a week    Attends Religious Services: More than 4 times per year    Active Member of [employer] or  Organizations: No    Attends Engineer, structural: Never    Marital Status: Married    Tobacco Counseling Counseling given: Not Answered   Clinical Intake:  Pre-visit preparation completed: Yes  Pain : No/denies pain Pain Score: 0-No pain     BMI - recorded: 35 Nutritional Status: BMI >  30  Obese Nutritional Risks: None Diabetes: No  How often do you need to have someone help you when you read instructions, pamphlets, or other written materials from your doctor or pharmacy?: 1 - Never  Interpreter Needed?: No  Information entered by :: Kennedy Bucker, LPN   Activities of Daily Living    04/16/2023    9:39 AM 06/18/2022    9:16 AM  In your present state of health, do you have any difficulty performing the following activities:  Hearing? 0 0  Vision? 0 0  Difficulty concentrating or making decisions? 0 0  Walking or climbing stairs? 0 0  Dressing or bathing? 0 0  Doing errands, shopping? 0   Preparing Food and eating ? N   Using the Toilet? N   In the past six months, have you accidently leaked urine? N   Do you have problems with loss of bowel control? N   Managing your Medications? N   Managing your Finances? N   Housekeeping or managing your Housekeeping? N     Patient Care Team: Reubin Milan, MD as PCP - General (Internal Medicine) Lamar Blinks, MD as Consulting Physician (Cardiology) Mertie Moores, MD as Referring Physician (Pulmonary Disease) Tedd Sias Marlana Salvage, MD as Physician Assistant (Endocrinology) Marcene Corning, MD as Consulting Physician (Orthopedic Surgery) Wynona Canes (Gastroenterology) Willeen Niece, MD (Dermatology) Schnier, Latina Craver, MD (Vascular Surgery) Galen Manila, MD as Referring Physician (Ophthalmology)  Indicate any recent Medical Services you may have received from other than [facility] providers in the past year (date may be approximate).     Assessment:   This is a routine wellness examination  for Kimberly Andrade.  Hearing/Vision screen Hearing Screening - Comments:: NO AIDS Vision Screening - Comments:: WEARS GLASSES ALL THE TIME- [email or web address]   Goals Addressed             This Visit's Progress    DIET - INCREASE WATER INTAKE         Depression Screen    04/16/2023    9:36 AM 04/07/2023    9:18 AM 02/17/2023    9:38 AM 08/21/2022    8:52 AM 04/10/2022    9:31 AM 02/13/2022    8:51 AM 09/06/2021    9:45 AM  PHQ 2/9 Scores  PHQ - 2 Score 0 0 2 0 0 0 1  PHQ- 9 Score 0  5 4 0 3 4    Fall Risk    04/16/2023    9:39 AM 04/07/2023    9:18 AM 02/17/2023    9:38 AM 08/21/2022    8:52 AM 04/10/2022    9:33 AM  Fall Risk   Falls in the past year? 0 0 0 0 0  Number falls in past yr: 0 0 0 0 0  Injury with Fall? 0 0 0 0 0  Risk for fall due to : No Fall Risks No Fall Risks No Fall Risks No Fall Risks No Fall Risks  Follow up Falls prevention discussed;Falls evaluation completed Falls evaluation completed Falls evaluation completed Falls evaluation completed Falls prevention discussed;Falls evaluation completed    MEDICARE RISK AT HOME: Medicare Risk at Home Any stairs in or around the home?: Yes If so, are there any without handrails?: No Home free of loose throw rugs in walkways, pet beds, electrical cords, etc?: Yes Adequate lighting in your home to reduce risk of falls?: Yes Life alert?: No Use of a cane, walker or w/c?: Yes (CANE OCCASIONALLY) Grab  bars in the bathroom?: Yes Shower chair or bench in shower?: Yes Elevated toilet seat or a handicapped toilet?: Yes  TIMED UP AND GO:  Was the test performed?  No    Cognitive Function:        04/16/2023    9:40 AM 04/10/2022    9:33 AM 03/10/2019   10:55 AM  6CIT Screen  What Year? 0 points 0 points 0 points  What month? 0 points 0 points 0 points  What time? 0 points 0 points 0 points  Count back from 20 0 points 0 points 0 points  Months in reverse 0 points 0 points 0 points  Repeat phrase 0 points 0 points 0 points   Total Score 0 points 0 points 0 points    Immunizations Immunization History  Administered Date(s) Administered   Fluad Quad(high Dose 65+) 12/11/2018, 02/16/2020, 02/19/2021, 02/13/2022   Fluad Trivalent(High Dose 65+) 02/17/2023   Fluzone Influenza virus vaccine,trivalent (IIV3), split virus 01/22/2012   Influenza, High Dose Seasonal PF 11/27/2017   Influenza, Seasonal, Injecte, Preservative Fre 11/27/2017   PNEUMOCOCCAL CONJUGATE-20 02/13/2022   Pneumococcal Conjugate-13 06/29/2015   Pneumococcal Polysaccharide-23 04/29/2011   Tdap 06/29/2015, 07/10/2016   Zoster Recombinant(Shingrix) 10/19/2020, 01/19/2021   Zoster, Live 02/01/2014    TDAP status: Up to date  Flu Vaccine status: Up to date  Pneumococcal vaccine status: Up to date  Covid-19 vaccine status: Declined, Education has been provided regarding the importance of this vaccine but patient still declined. Advised may receive this vaccine at local pharmacy or Health Dept.or vaccine clinic. Aware to provide a copy of the vaccination record if obtained from local pharmacy or Health Dept. Verbalized acceptance and understanding.  Qualifies for Shingles Vaccine? Yes   Zostavax completed Yes   Shingrix Completed?: Yes  Screening Tests Health Maintenance  Topic Date Due   MAMMOGRAM  09/30/2023   Medicare Annual Wellness (AWV)  04/15/2024   Colonoscopy  03/25/2026   DTaP/Tdap/Td (3 - Td or Tdap) 07/11/2026   Pneumonia Vaccine 21+ Years old  Completed   INFLUENZA VACCINE  Completed   DEXA SCAN  Completed   Hepatitis C Screening  Completed   Zoster Vaccines- Shingrix  Completed   HPV VACCINES  Aged Out   COVID-19 Vaccine  Discontinued    Health Maintenance  There are no preventive care reminders to display for this patient.   Colorectal cancer screening: Type of screening: Colonoscopy. Completed 03/26/23. Repeat every 3 years- AGED OUT  Mammogram status: Completed 09/30/22. Repeat every year  Bone Density  status: Completed 10/03/22. Results reflect: Bone density results: OSTEOPOROSIS. Repeat every 2 years.  Lung Cancer Screening: (Low Dose CT Chest recommended if Age 29-80 years, 20 pack-year currently smoking OR have quit w/in 15years.) does not qualify.    Additional Screening:  Hepatitis C Screening: does qualify; Completed 08/06/16  Vision Screening: Recommended annual ophthalmology exams for early detection of glaucoma and other disorders of the eye. Is the patient up to date with their annual eye exam?  Yes  Who is the provider or what is the name of the office in which the patient attends annual eye exams? DR.[email or web address] If pt is not established with a provider, would they like to be referred to a provider to establish care? No .   Dental Screening: Recommended annual dental exams for proper oral hygiene   Community Resource Referral / Chronic Care Management: CRR required this visit?  No   CCM required this visit?  No  Plan:     I have personally reviewed and noted the following in the patient's chart:   Medical and social history Use of alcohol, tobacco or illicit drugs  Current medications and supplements including opioid prescriptions. Patient is not currently taking opioid prescriptions. Functional ability and status Nutritional status Physical activity Advanced directives List of other physicians Hospitalizations, surgeries, and ER visits in previous 12 months Vitals Screenings to include cognitive, depression, and falls Referrals and appointments  In addition, I have reviewed and discussed with patient certain preventive protocols, quality metrics, and best practice recommendations. A written personalized care plan for preventive services as well as general preventive health recommendations were provided to patient.     Hal Hope, LPN   06/04/4740   After Visit Summary: (MyChart) Due to this being a telephonic visit, the after visit summary with patients  personalized plan was offered to patient via MyChart   Nurse Notes: NONE

## 2023-04-22 ENCOUNTER — Other Ambulatory Visit: Payer: Self-pay | Admitting: Internal Medicine

## 2023-04-22 ENCOUNTER — Encounter: Payer: Self-pay | Admitting: Internal Medicine

## 2023-04-22 DIAGNOSIS — J209 Acute bronchitis, unspecified: Secondary | ICD-10-CM

## 2023-04-22 MED ORDER — PROMETHAZINE-DM 6.25-15 MG/5ML PO SYRP: 5.0000 mL | ORAL_SOLUTION | Freq: Four times a day (QID) | ORAL | 0 refills | Status: AC | PRN

## 2023-05-11 ENCOUNTER — Ambulatory Visit
Admission: EM | Admit: 2023-05-11 | Discharge: 2023-05-11 | Disposition: A | Discharge status: Home / Self Care | Attending: Family Medicine | Admitting: Family Medicine

## 2023-05-11 ENCOUNTER — Ambulatory Visit (INDEPENDENT_AMBULATORY_CARE_PROVIDER_SITE_OTHER)

## 2023-05-11 DIAGNOSIS — R052 Subacute cough: Secondary | ICD-10-CM | POA: Diagnosis not present

## 2023-05-11 DIAGNOSIS — J209 Acute bronchitis, unspecified: Secondary | ICD-10-CM | POA: Diagnosis not present

## 2023-05-11 DIAGNOSIS — J3089 Other allergic rhinitis: Secondary | ICD-10-CM

## 2023-05-11 MED ORDER — PROMETHAZINE-DM 6.25-15 MG/5ML PO SYRP: 5.0000 mL | ORAL_SOLUTION | Freq: Four times a day (QID) | ORAL | 0 refills | Status: DC | PRN

## 2023-05-11 MED ORDER — DEXAMETHASONE SODIUM PHOSPHATE 10 MG/ML IJ SOLN
10.0000 mg | Freq: Once | INTRAMUSCULAR | Status: AC
  Administered 2023-05-11: 10 mg via INTRAMUSCULAR

## 2023-05-11 MED ORDER — CETIRIZINE HCL 5 MG PO TABS: 5.0000 mg | ORAL_TABLET | Freq: Every day | ORAL | 2 refills | Status: DC

## 2023-05-11 MED ORDER — FLUTICASONE PROPIONATE 50 MCG/ACT NA SUSP: 1.0000 | Freq: Two times a day (BID) | NASAL | 2 refills | Status: DC

## 2023-05-11 NOTE — ED Triage Notes (Signed)
 Pt reports cough, congestion ear pain, sore throat. States she has done 1 round of antibiotics 1 month ago and it seemed to help,  but the sx's have come back.

## 2023-05-11 NOTE — ED Provider Notes (Signed)
 [facility]    CSN: [id number] Arrival date & time: 05/11/23  0825      History   Chief Complaint No chief complaint on file.   HPI Kimberly Andrade is a 78 y.o. female.   Patient presenting today with over a month of ongoing cough, congestion, sinus pressure, scratchy throat, postnasal drainage.  Denies fever, chills, chest pain, severe shortness of breath, abdominal pain, vomiting, diarrhea.  Was treated about a month ago for bronchitis with azithromycin and cough syrup with mild temporary benefit and since has been taking over-the-counter cough syrups, allergy medicine here and there with minimal relief.  No known history of chronic pulmonary disease.    Past Medical History:  Diagnosis Date   Arthritis    B12 deficiency    Bronchiectasis [facility])    pulmonology   Crohn's disease (HCC)    Diverticulosis    DJD (degenerative joint disease)    Essential hypertension 08/16/2019   Full dentures    GERD (gastroesophageal reflux disease)    pt deneis    Heart palpitations    Hypercholesterolemia    Hyperlipidemia    Incontinence of urine    Nocturnal leg cramps    Osteopenia    Osteoporosis    Reflux    Sleep apnea    uses a cpap   Vertigo    last episode 07/2021   Wears glasses     Patient Active Problem List   Diagnosis Date Noted   Paroxysmal A-fib (HCC) 01/27/2023   Polyneuropathy 08/21/2022   Bronchiectasis without complication (HCC) 02/13/2022   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   Lymphedema 03/30/2021   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   Essential hypertension 08/16/2019   Morton's neuroma 07/27/2019   Atherosclerosis of abdominal aorta (HCC) 03/16/2018   DDD (degenerative disc disease), 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/2017   Prediabetes 12/25/2016   Nocturnal leg cramps 12/25/2016   Postmenopausal osteoporosis 10/16/2016   Urinary incontinence in female 10/16/2016   Mixed hyperlipidemia 10/05/2015   GERD (gastroesophageal reflux disease) 04/24/2015    Crohn's disease of small intestine without complication (HCC) 04/24/2015   Obstructive sleep apnea of adult 11/18/2013    Past Surgical History:  Procedure Laterality Date   ABDOMINAL HYSTERECTOMY     APPENDECTOMY     BIOPSY  03/26/2023   Procedure: BIOPSY;  Surgeon: Norma Fredrickson, Boykin Nearing, MD;  Location: [facility] ENDOSCOPY;  Service: Gastroenterology;;   Dierdre Searles     CATARACT EXTRACTION W/PHACO Right 05/28/2022   Procedure: CATARACT EXTRACTION PHACO AND INTRAOCULAR LENS PLACEMENT (IOC) RIGHT  5.21  00:37.2;  Surgeon: Galen Manila, MD;  Location: [facility] SURGERY CNTR;  Service: Ophthalmology;  Laterality: Right;  sleep apnea   CATARACT EXTRACTION W/PHACO Left 06/18/2022   Procedure: CATARACT EXTRACTION PHACO AND INTRAOCULAR LENS PLACEMENT (IOC) LEFT 5.64 00:39.4;  Surgeon: Galen Manila, MD;  Location: [facility] SURGERY [facility]TR;  Service: Ophthalmology;  Laterality: Left;  sleep apnea   CHOLECYSTECTOMY     COLONOSCOPY N/A 03/26/2023   Procedure: COLONOSCOPY;  Surgeon: Toledo, Boykin Nearing, MD;  Location: [facility] ENDOSCOPY;  Service: Gastroenterology;  Laterality: N/A;   COLONOSCOPY WITH PROPOFOL N/A 03/11/2016   Procedure: COLONOSCOPY WITH PROPOFOL;  Surgeon: Christena Deem, MD;  Location: [facility] ENDOSCOPY;  Service: Endoscopy;  Laterality: N/A;   DILATION AND CURETTAGE OF UTERUS     ESOPHAGOGASTRODUODENOSCOPY N/A 03/11/2016   Procedure: ESOPHAGOGASTRODUODENOSCOPY (EGD);  Surgeon: Christena Deem, MD;  Location: [facility] ENDOSCOPY;  Service: Endoscopy;  Laterality: N/A;   ESOPHAGOGASTRODUODENOSCOPY (EGD) WITH PROPOFOL N/A  11/11/2017   Procedure: ESOPHAGOGASTRODUODENOSCOPY (EGD) WITH PROPOFOL;  Surgeon: Christena Deem, MD;  Location: [facility] ENDOSCOPY;  Service: Endoscopy;  Laterality: N/A;   ESOPHAGOGASTRODUODENOSCOPY (EGD) WITH PROPOFOL N/A 01/20/2018   Procedure: ESOPHAGOGASTRODUODENOSCOPY (EGD) WITH PROPOFOL;  Surgeon: Christena Deem, MD;  Location: [facility] ENDOSCOPY;  Service: Endoscopy;   Laterality: N/A;   FOOT SURGERY Right    HAMMER TOE SURGERY     JOINT REPLACEMENT Right 2006   shoulder   KNEE ARTHROSCOPY Right 05/25/2013   Procedure: ARTHROSCOPY KNEE;  Surgeon: Velna Ochs, MD;  Location: [facility];  Service: Orthopedics;  Laterality: Right;  medial and lateral meniscal debridment and chondroplasty   RADIOFREQUENCY ABLATION NERVES Bilateral 07/2022   lumbar spine   righti shoulder replacement      ROBOTIC ASSISTED LAPAROSCOPIC REPAIR OF PARAESOPHAGEAL HERNIA  07/16/2018   SHOULDER ARTHROSCOPY WITH ROTATOR CUFF REPAIR AND SUBACROMIAL DECOMPRESSION Left 08/14/2017   Procedure: SHOULDER ARTHROSCOPY WITH ROTATOR CUFF REPAIR AND SUBACROMIAL DECOMPRESSION;  Surgeon: Jones Broom, MD;  Location: [facility] OR;  Service: Orthopedics;  Laterality: Left;   SHOULDER SURGERY Right    X 3   TOTAL HIP ARTHROPLASTY Left 11/25/2017   Procedure: LEFT TOTAL HIP ARTHROPLASTY ANTERIOR APPROACH;  Surgeon: Marcene Corning, MD;  Location: [facility] OR;  Service: Orthopedics;  Laterality: Left;   TOTAL KNEE ARTHROPLASTY Right 04/18/2020   Procedure: RIGHT TOTAL KNEE ARTHROPLASTY;  Surgeon: Marcene Corning, MD;  Location: [facility] ORS;  Service: Orthopedics;  Laterality: Right;    OB History   No obstetric history on file.      Home Medications    Prior to Admission medications   Medication Sig Start Date End Date Taking? Authorizing Provider  cetirizine (ZYRTEC) 5 MG tablet Take 1 tablet (5 mg total) by mouth daily. 05/11/23  Yes Particia Nearing, PA-C  fluticasone [facility]) 50 MCG/ACT nasal spray Place 1 spray into both nostrils 2 (two) times daily. 05/11/23  Yes Particia Nearing, PA-C  promethazine-dextromethorphan (PROMETHAZINE-DM) 6.25-15 MG/5ML syrup Take 5 mLs by mouth 4 (four) times daily as needed. 05/11/23  Yes Particia Nearing, PA-C  albuterol (VENTOLIN HFA) 108 (90 Base) MCG/ACT inhaler SMARTSIG:2 Puff(s) By Mouth Every 6 Hours PRN Patient not taking:  Reported on 04/16/2023 11/08/20   [provider]  apixaban (ELIQUIS) 5 MG TABS tablet Take 1 tablet (5 mg total) by mouth 2 (two) times daily. 11/25/22 04/07/23  Delton Prairie, MD  Ascorbic Acid (VITAMIN C PO) Take 500 mg by mouth daily.    [provider]  benzonatate (TESSALON) 200 MG capsule Take 1 capsule (200 mg total) by mouth 3 (three) times daily as needed for cough. 04/07/23   Remo Lipps, PA  Calcium Carbonate-Vit D-Min (CALCIUM 600+D3 PLUS MINERALS) 600-800 MG-UNIT TABS Take 1 tablet by mouth daily.    [provider]  cholecalciferol (VITAMIN D) 25 MCG (1000 UNIT) tablet Take 1,000 Units by mouth daily.    [provider]  ferrous sulfate 324 MG TBEC Take 324 mg by mouth daily with breakfast.    [provider]  losartan (COZAAR) 50 MG tablet Take 1 tablet (50 mg total) by mouth daily. 02/17/23   Reubin Milan, MD  Magnesium 250 MG TABS Take 500 mg by mouth daily.    [provider]  mesalamine (PENTASA) 500 MG CR capsule Take 2,000 mg by mouth 2 (two) times daily as needed (Crohn's).    [provider]  Multiple Vitamins-Minerals (MULTIVITAMIN WITH MINERALS) tablet Take  1 tablet by mouth daily.    [provider]  NON FORMULARY BiPap nightly    [provider]  Omega-3 Fatty Acids (FISH OIL PO) Take 1,200 mg by mouth at bedtime.    [provider]  oxybutynin (DITROPAN XL) 15 MG 24 hr tablet Take 1 tablet (15 mg total) by mouth at bedtime. 02/17/23   Reubin Milan, MD  pantoprazole (PROTONIX) 40 MG tablet Take 40 mg by mouth every morning. 06/04/21   [provider]  Potassium 99 MG TABS Take 99 mg by mouth daily.     [provider]  pravastatin (PRAVACHOL) 40 MG tablet Take 1 tablet (40 mg total) by mouth daily. 02/17/23   Reubin Milan, MD  pregabalin (LYRICA) 25 MG capsule Take 25 mg by mouth. Patient taking 1 in the AM, 1 at Lunch, and 3 at night.    [provider]  pyridOXINE (VITAMIN B6) 100 MG tablet Take 100 mg by mouth daily.    [provider]  vitamin B-12 (CYANOCOBALAMIN) 1000 MCG tablet Take 1,000 mcg by mouth daily.    [provider]  zinc gluconate 50 MG tablet Take 50 mg by mouth daily.    [provider]    Family History Family History  Problem Relation Age of Onset   Heart disease Mother    Esophageal cancer Father     Social History Social History   Tobacco Use   Smoking status: Former    Current packs/day: 0.00    Types: Cigarettes    Quit date: 12/29/2001    Years since quitting: 21.3   Smokeless tobacco: Never  Vaping Use   Vaping status: Never Used  Substance Use Topics   Alcohol use: No   Drug use: No     Allergies   Fosamax [alendronate sodium], Lipitor [atorvastatin], Codeine, and Hydrocodone   Review of Systems Review of Systems Per HPI  Physical Exam Triage Vital Signs ED Triage Vitals  Encounter Vitals Group     BP 05/11/23 0832 123/78     Systolic BP Percentile --      Diastolic BP Percentile --      Pulse Rate 05/11/23 0832 97     Resp 05/11/23 0832 18     Temp 05/11/23 0832 98.6 F (37 C)     Temp Source 05/11/23 0832 Oral     SpO2 05/11/23 0832 94 %     Weight --      Height --      Head Circumference --      Peak Flow --      Pain Score 05/11/23 0836 0     Pain Loc --      Pain Education --      Exclude from Growth Chart --    No data found.  Updated Vital Signs BP 123/78 (BP Location: Right Arm)   Pulse 97   Temp 98.6 F (37 C) (Oral)   Resp 18   SpO2 94%   Visual Acuity Right Eye Distance:   Left Eye Distance:   Bilateral Distance:    Right Eye Near:   Left Eye Near:    Bilateral Near:     Physical Exam Vitals and nursing note reviewed.  Constitutional:      Appearance: Normal appearance.  HENT:     Head: Atraumatic.     Right Ear: Tympanic membrane and external ear normal.     Left Ear: Tympanic membrane and  external ear normal.     Nose: Rhinorrhea present.     Mouth/Throat:     Mouth: Mucous membranes are moist.     Pharynx: Posterior oropharyngeal erythema present.  Eyes:     Extraocular Movements: Extraocular movements intact.     Conjunctiva/sclera: Conjunctivae normal.  Cardiovascular:     Rate and Rhythm: Normal rate and regular rhythm.     Heart sounds: Normal heart sounds.  Pulmonary:     Effort: Pulmonary effort is normal.     Breath sounds: Normal breath sounds. No wheezing or rales.  Musculoskeletal:        General: Normal range of motion.     Cervical back: Normal range of motion and neck supple.  Skin:    General: Skin is warm and dry.  Neurological:     Mental Status: She is alert and oriented to person, place, and time.  Psychiatric:        Mood and Affect: Mood normal.        Thought Content: Thought content normal.      UC Treatments / Results  Labs (all labs ordered are listed, but only abnormal results are displayed) Labs Reviewed - No data to display  EKG   Radiology DG Chest 2 View Result Date: 05/11/2023 CLINICAL DATA:  Productive cough. Congestion for greater than 1 month. EXAM: CHEST - 2 VIEW COMPARISON:  One-view chest x-ray 11/25/2022. FINDINGS: The heart size is normal. Atherosclerotic calcifications are present at the aortic arch. Changes of COPD are noted. Minimal atelectasis is present at the bases. No focal airspace consolidation or edema is present. No effusions are present. Right shoulder hemiarthroplasty is again noted. IMPRESSION: No active cardiopulmonary disease. Electronically Signed   By: Marin Roberts [name]   On: 05/11/2023 09:47    Procedures Procedures (including critical care time)  Medications Ordered in UC Medications  dexamethasone (DECADRON) injection 10 mg (10 mg Intramuscular Given 05/11/23 0946)    Initial Impression / Assessment and Plan / UC Course  I have reviewed the triage vital signs and the nursing  notes.  Pertinent labs & imaging results that were available during my care of the patient were reviewed by me and considered in my medical decision making (see chart for details).     Suspect ongoing seasonal allergies causing some bronchitis.  Chest x-ray negative for pneumonia, exam overall reassuring.  Treat with IM Decadron, consistent allergy regimen of Zyrtec and Flonase, Phenergan DM, supportive over-the-counter medications and home care.  Return for worsening symptoms.  Has follow-up with her pulmonologist on Thursday.  Final Clinical Impressions(s) / UC Diagnoses   Final diagnoses:  Subacute cough  Seasonal allergic rhinitis due to other allergic trigger  Acute bronchitis, unspecified organism     Discharge Instructions      We will give you a call if anything comes back abnormal on the chest x-ray.  We have given you a steroid shot today and I have sent in allergy medications to take consistently daily and some cough syrup.  Follow-up if significantly worsening    ED Prescriptions     Medication Sig Dispense Auth. Provider   promethazine-dextromethorphan (PROMETHAZINE-DM) 6.25-15 MG/5ML syrup Take 5 mLs by mouth 4 (four) times daily as needed. 100 mL Particia Nearing, PA-C   fluticasone [facility]) 50 MCG/ACT nasal spray Place 1 spray into both nostrils 2 (two) times daily. 16 g Particia Nearing, [place]   cetirizine (ZYRTEC) 5 MG tablet Take 1 tablet (5 mg total) by  mouth daily. 30 tablet Particia Nearing, [place]      PDMP not reviewed this encounter.   Particia Nearing, [place] 05/11/23 1005

## 2023-05-11 NOTE — Discharge Instructions (Signed)
 We will give you a call if anything comes back abnormal on the chest x-ray.  We have given you a steroid shot today and I have sent in allergy medications to take consistently daily and some cough syrup.  Follow-up if significantly worsening

## 2023-07-01 ENCOUNTER — Other Ambulatory Visit
Admission: RE | Admit: 2023-07-01 | Discharge: 2023-07-01 | Disposition: A | Discharge status: Home / Self Care | Source: Ambulatory Visit | Attending: Student | Admitting: Student

## 2023-07-01 DIAGNOSIS — R0602 Shortness of breath: Secondary | ICD-10-CM | POA: Diagnosis present

## 2023-07-01 DIAGNOSIS — I10 Essential (primary) hypertension: Secondary | ICD-10-CM | POA: Insufficient documentation

## 2023-07-01 LAB — BRAIN NATRIURETIC PEPTIDE: B Natriuretic Peptide: 367.7 pg/mL — ABNORMAL HIGH (ref 0.0–100.0)

## 2023-07-07 ENCOUNTER — Encounter: Payer: Self-pay | Admitting: Dermatology

## 2023-07-07 ENCOUNTER — Ambulatory Visit (INDEPENDENT_AMBULATORY_CARE_PROVIDER_SITE_OTHER): Payer: Medicare Other | Admitting: Dermatology

## 2023-07-07 DIAGNOSIS — D229 Melanocytic nevi, unspecified: Secondary | ICD-10-CM

## 2023-07-07 DIAGNOSIS — Z1283 Encounter for screening for malignant neoplasm of skin: Secondary | ICD-10-CM | POA: Diagnosis not present

## 2023-07-07 DIAGNOSIS — L820 Inflamed seborrheic keratosis: Secondary | ICD-10-CM

## 2023-07-07 DIAGNOSIS — L821 Other seborrheic keratosis: Secondary | ICD-10-CM

## 2023-07-07 DIAGNOSIS — D2239 Melanocytic nevi of other parts of face: Secondary | ICD-10-CM

## 2023-07-07 DIAGNOSIS — W908XXA Exposure to other nonionizing radiation, initial encounter: Secondary | ICD-10-CM

## 2023-07-07 DIAGNOSIS — D225 Melanocytic nevi of trunk: Secondary | ICD-10-CM

## 2023-07-07 DIAGNOSIS — L814 Other melanin hyperpigmentation: Secondary | ICD-10-CM | POA: Diagnosis not present

## 2023-07-07 DIAGNOSIS — D2271 Melanocytic nevi of right lower limb, including hip: Secondary | ICD-10-CM

## 2023-07-07 DIAGNOSIS — D1801 Hemangioma of skin and subcutaneous tissue: Secondary | ICD-10-CM

## 2023-07-07 DIAGNOSIS — L578 Other skin changes due to chronic exposure to nonionizing radiation: Secondary | ICD-10-CM

## 2023-07-07 DIAGNOSIS — D692 Other nonthrombocytopenic purpura: Secondary | ICD-10-CM

## 2023-07-07 NOTE — Progress Notes (Signed)
 Follow-Up Visit   Subjective  Kimberly Andrade is a 78 y.o. female who presents for the following: Skin Cancer Screening and Full Body Skin Exam, check spot chest, pt would like removed  The patient presents for Total-Body Skin Exam (TBSE) for skin cancer screening and mole check. The patient has spots, moles and lesions to be evaluated, some may be new or changing and the patient may have concern these could be cancer.    The following portions of the chart were reviewed this encounter and updated as appropriate: medications, allergies, medical history  Review of Systems:  No other skin or systemic complaints except as noted in HPI or Assessment and Plan.  Objective  Well appearing patient in no apparent distress; mood and affect are within normal limits.  A full examination was performed including scalp, head, eyes, ears, nose, lips, neck, chest, axillae, abdomen, back, buttocks, bilateral upper extremities, bilateral lower extremities, hands, feet, fingers, toes, fingernails, and toenails. All findings within normal limits unless otherwise noted below.   Relevant physical exam findings are noted in the Assessment and Plan.  R upper sternum x 1 Stuck on waxy paps with erythema  Assessment & Plan   SKIN CANCER SCREENING PERFORMED TODAY.  ACTINIC DAMAGE - Chronic condition, secondary to cumulative UV/sun exposure - diffuse scaly erythematous macules with underlying dyspigmentation - Recommend daily broad spectrum sunscreen SPF 30+ to sun-exposed areas, reapply every 2 hours as needed.  - Staying in the shade or wearing long sleeves, sun glasses (UVA+UVB protection) and wide brim hats (4-inch brim around the entire circumference of the hat) are also recommended for sun protection.  - Call for new or changing lesions.  LENTIGINES, SEBORRHEIC KERATOSES, HEMANGIOMAS - Benign normal skin lesions - Benign-appearing - Call for any changes  MELANOCYTIC NEVI - Tan-brown and/or  pink-flesh-colored symmetric macules and papules - Right calf  6.0 x 4.32mm tan papule darker inferior, with emerging hair - Right intermammary 6 mm speckled brown papule  - Left Malar Cheek 5.41mm pink flesh papule (vs SK vs Sebaceous Hyperplasia)  - Right temple 4.0 x 3.6mm pink brown papule at the edge of a waxy tan papule (with SK) - Benign appearing on exam today - Observation - Call clinic for new or changing moles - Recommend daily use of broad spectrum spf 30+ sunscreen to sun-exposed areas.     LENTIGO  Exam:  3.15mm 'V" shaped brown macule R upper back  Treatment Plan: Benign-appearing.  Observation.  Call clinic for new or changing moles.  Recommend daily use of broad spectrum spf 30+ sunscreen to sun-exposed areas.   Purpura - Chronic; persistent and recurrent.  Treatable, but not curable. Arms, proximal nail R great toe - Violaceous macules and patches - Benign - Related to trauma, age, sun damage and/or use of blood thinners, chronic use of topical and/or oral steroids - Observe - Can use OTC arnica containing moisturizer such as Dermend Bruise Formula if desired - Call for worsening or other concerns  INFLAMED SEBORRHEIC KERATOSIS R upper sternum x 1 Symptomatic, irritating, patient would like treated. Destruction of lesion - R upper sternum x 1  Destruction method: cryotherapy   Informed consent: discussed and consent obtained   Lesion destroyed using liquid nitrogen: Yes   Region frozen until ice ball extended beyond lesion: Yes   Outcome: patient tolerated procedure well with no complications   Post-procedure details: wound care instructions given   Additional details:  Prior to procedure, discussed risks of blister formation,  small wound, skin dyspigmentation, or rare scar following cryotherapy. Recommend Vaseline ointment to treated areas while healing.  Return in about 1 year (around 07/06/2024) for TBSE.  I, Rollie Clipper, RMA, am acting as scribe for Artemio Larry, MD .   Documentation: I have reviewed the above documentation for accuracy and completeness, and I agree with the above.  Artemio Larry, MD

## 2023-07-07 NOTE — Patient Instructions (Addendum)

## 2023-07-17 ENCOUNTER — Encounter: Payer: Self-pay | Admitting: Emergency Medicine

## 2023-07-17 ENCOUNTER — Ambulatory Visit
Admission: EM | Admit: 2023-07-17 | Discharge: 2023-07-17 | Disposition: A | Discharge status: Home / Self Care | Attending: Nurse Practitioner | Admitting: Nurse Practitioner

## 2023-07-17 DIAGNOSIS — J069 Acute upper respiratory infection, unspecified: Secondary | ICD-10-CM

## 2023-07-17 LAB — POC SARS CORONAVIRUS 2 AG -  ED: SARS Coronavirus 2 Ag: NEGATIVE

## 2023-07-17 MED ORDER — BENZONATATE 200 MG PO CAPS: 200.0000 mg | ORAL_CAPSULE | Freq: Three times a day (TID) | ORAL | 0 refills | Status: DC | PRN

## 2023-07-17 NOTE — Discharge Instructions (Addendum)
 The COVID test was negative. Take medication as prescribed. Recommend over-the-counter Mucinex, Delsym, or Flonase  while symptoms persist.  Take medications as directed. May take over-the-counter Tylenol  as needed for pain, fever, or general discomfort. Also find it helpful to use normal saline nasal spray throughout the day for nasal congestion and runny nose. For your cough, you may find it helpful to sleep elevated on pillows and to use a humidifier at nighttime during sleep. If symptoms have not improved over the next several days, or appear to worsen, you may follow-up in this clinic or with your primary care physician for further evaluation. Follow-up as needed.

## 2023-07-17 NOTE — ED Triage Notes (Signed)
 Cough, congestion since Monday.  States cough is productive at times.  States throat is sore a times.

## 2023-07-17 NOTE — ED Provider Notes (Signed)
 [facility]    CSN: [id number] Arrival date & time: 07/17/23  0847      History   Chief Complaint No chief complaint on file.   HPI Kimberly Andrade is a 78 y.o. female.   The history is provided by the patient.   Patient presents with a 2 to 3-day history of cough, nasal congestion, postnasal drainage, and sore throat.  Patient denies fever, chills, headache, ear pain, wheezing, difficulty breathing, chest pain, abdominal pain, nausea, vomiting, diarrhea, or rash.  Patient states that she spent several hours outside trying to obtain her license at the [facility] prior to symptoms starting.  Patient states she has taken Mucinex for her symptoms.  Past Medical History:  Diagnosis Date   Arthritis    B12 deficiency    Bronchiectasis [facility])    pulmonology   Crohn's disease (HCC)    Diverticulosis    DJD (degenerative joint disease)    Essential hypertension 08/16/2019   Full dentures    GERD (gastroesophageal reflux disease)    pt deneis    Heart palpitations    Hypercholesterolemia    Hyperlipidemia    Incontinence of urine    Nocturnal leg cramps    Osteopenia    Osteoporosis    Reflux    Sleep apnea    uses a cpap   Vertigo    last episode 07/2021   Wears glasses     Patient Active Problem List   Diagnosis Date Noted   Paroxysmal A-fib (HCC) 01/27/2023   Polyneuropathy 08/21/2022   Bronchiectasis without complication (HCC) 02/13/2022   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   Lymphedema 03/30/2021   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   Essential hypertension 08/16/2019   Morton's neuroma 07/27/2019   Atherosclerosis of abdominal aorta (HCC) 03/16/2018   DDD (degenerative disc disease), 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/2017   Prediabetes 12/25/2016   Nocturnal leg cramps 12/25/2016   Postmenopausal osteoporosis 10/16/2016   Urinary incontinence in female 10/16/2016   Mixed hyperlipidemia 10/05/2015   GERD (gastroesophageal reflux disease) 04/24/2015   Crohn's  disease of small intestine without complication (HCC) 04/24/2015   Obstructive sleep apnea of adult 11/18/2013    Past Surgical History:  Procedure Laterality Date   ABDOMINAL HYSTERECTOMY     APPENDECTOMY     BIOPSY  03/26/2023   Procedure: BIOPSY;  Surgeon: Corky Diener, Alphonsus Jeans, MD;  Location: [facility] ENDOSCOPY;  Service: Gastroenterology;;   Lenice Quill     CATARACT EXTRACTION W/PHACO Right 05/28/2022   Procedure: CATARACT EXTRACTION PHACO AND INTRAOCULAR LENS PLACEMENT (IOC) RIGHT  5.21  00:37.2;  Surgeon: Clair Crews, MD;  Location: [facility];  Service: Ophthalmology;  Laterality: Right;  sleep apnea   CATARACT EXTRACTION W/PHACO Left 06/18/2022   Procedure: CATARACT EXTRACTION PHACO AND INTRAOCULAR LENS PLACEMENT (IOC) LEFT 5.64 00:39.4;  Surgeon: Clair Crews, MD;  Location: [facility];  Service: Ophthalmology;  Laterality: Left;  sleep apnea   CHOLECYSTECTOMY     COLONOSCOPY N/A 03/26/2023   Procedure: COLONOSCOPY;  Surgeon: Toledo, Alphonsus Jeans, MD;  Location: [facility] ENDOSCOPY;  Service: Gastroenterology;  Laterality: N/A;   COLONOSCOPY WITH PROPOFOL  N/A 03/11/2016   Procedure: COLONOSCOPY WITH PROPOFOL ;  Surgeon: Deveron Fly, MD;  Location: [facility] END[facility]COPY;  Service: Endoscopy;  Laterality: N/A;   DILATION AND CURETTAGE OF UTERUS     ESOPHAGOGASTRODUODENOSCOPY N/A 03/11/2016   Procedure: ESOPHAGOGASTRODUODENOSCOPY (EGD);  Surgeon: Deveron Fly, MD;  Location: [facility] ENDOSCOPY;  Service: Endoscopy;  Laterality: N/A;   ESOPHAGOGASTRODUODENOSCOPY (EGD) WITH  PROPOFOL  N/A 11/11/2017   Procedure: ESOPHAGOGASTRODUODENOSCOPY (EGD) WITH PROPOFOL ;  Surgeon: Deveron Fly, MD;  Location: [facility] ENDOSCOPY;  Service: Endoscopy;  Laterality: N/A;   ESOPHAGOGASTRODUODENOSCOPY (EGD) WITH PROPOFOL  N/A 01/20/2018   Procedure: ESOPHAGOGASTRODUODENOSCOPY (EGD) WITH PROPOFOL ;  Surgeon: Deveron Fly, MD;  Location: [facility] ENDOSCOPY;  Service: Endoscopy;  Laterality:  N/A;   FOOT SURGERY Right    HAMMER TOE SURGERY     JOINT REPLACEMENT Right 2006   shoulder   KNEE ARTHROSCOPY Right 05/25/2013   Procedure: ARTHROSCOPY KNEE;  Surgeon: Alphonzo Ask, MD;  Location: [facility];  Service: Orthopedics;  Laterality: Right;  medial and lateral meniscal debridment and chondroplasty   RADIOFREQUENCY ABLATION NERVES Bilateral 07/2022   lumbar spine   righti shoulder replacement      ROBOTIC ASSISTED LAPAROSCOPIC REPAIR OF PARAESOPHAGEAL HERNIA  07/16/2018   SHOULDER ARTHROSCOPY WITH ROTATOR CUFF REPAIR AND SUBACROMIAL DECOMPRESSION Left 08/14/2017   Procedure: SHOULDER ARTHROSCOPY WITH ROTATOR CUFF REPAIR AND SUBACROMIAL DECOMPRESSION;  Surgeon: Sammye Cristal, MD;  Location: [facility] OR;  Service: Orthopedics;  Laterality: Left;   SHOULDER SURGERY Right    X 3   TOTAL HIP ARTHROPLASTY Left 11/25/2017   Procedure: LEFT TOTAL HIP ARTHROPLASTY ANTERIOR APPROACH;  Surgeon: Dayne Even, MD;  Location: [facility] OR;  Service: Orthopedics;  Laterality: Left;   TOTAL KNEE ARTHROPLASTY Right 04/18/2020   Procedure: RIGHT TOTAL KNEE ARTHROPLASTY;  Surgeon: Dayne Even, MD;  Location: [facility] ORS;  Service: Orthopedics;  Laterality: Right;    OB History   No obstetric history on file.      Home Medications    Prior to Admission medications   Medication Sig Start Date End Date Taking? Authorizing Provider  benzonatate  (TESSALON ) 200 MG capsule Take 1 capsule (200 mg total) by mouth 3 (three) times daily as needed for cough. 07/17/23  Yes Leath-Warren, Belen Bowers, NP  albuterol  (VENTOLIN  HFA) 108 (90 Base) MCG/ACT inhaler SMARTSIG:2 Puff(s) By Mouth Every 6 Hours PRN Patient not taking: Reported on 04/16/2023 11/08/20   [provider]  apixaban  (ELIQUIS ) 5 MG TABS tablet Take 1 tablet (5 mg total) by mouth 2 (two) times daily. 11/25/22 04/07/23  Arline Bennett, MD  Ascorbic Acid (VITAMIN C PO) Take 500 mg by mouth daily.    [provider]   Calcium  Carbonate-Vit D-Min (CALCIUM  600+D3 PLUS MINERALS) 600-800 MG-UNIT TABS Take 1 tablet by mouth daily.    [provider]  cetirizine  (ZYRTEC ) 5 MG tablet Take 1 tablet (5 mg total) by mouth daily. 05/11/23   Corbin Dess, PA-C  cholecalciferol  (VITAMIN D ) 25 MCG (1000 UNIT) tablet Take 1,000 Units by mouth daily.    [provider]  ferrous sulfate  324 MG TBEC Take 324 mg by mouth daily with breakfast.    [provider]  fluticasone  (FLONASE ) 50 MCG/ACT nasal spray Place 1 spray into both nostrils 2 (two) times daily. 05/11/23   Corbin Dess, PA-C  losartan  (COZAAR ) 50 MG tablet Take 1 tablet (50 mg total) by mouth daily. 02/17/23   Sheron Dixons, MD  Magnesium  250 MG TABS Take 500 mg by mouth daily.    [provider]  mesalamine  (PENTASA ) 500 MG CR capsule Take 2,000 mg by mouth 2 (two) times daily as needed (Crohn's).    [provider]  metoprolol  tartrate (LOPRESSOR ) 25 MG tablet Take 12.5 mg by mouth. 05/21/23 05/20/24  [provider]  Multiple Vitamins-Minerals (MULTIVITAMIN WITH MINERALS) tablet Take 1 tablet by mouth  daily.    [provider]  NON FORMULARY BiPap nightly    [provider]  Omega-3 Fatty Acids (FISH OIL PO) Take 1,200 mg by mouth at bedtime.    [provider]  oxybutynin  (DITROPAN  XL) 15 MG 24 hr tablet Take 1 tablet (15 mg total) by mouth at bedtime. 02/17/23   Sheron Dixons, MD  pantoprazole  (PROTONIX ) 40 MG tablet Take 40 mg by mouth every morning. 06/04/21   [provider]  Potassium 99 MG TABS Take 99 mg by mouth daily.     [provider]  pravastatin  (PRAVACHOL ) 40 MG tablet Take 1 tablet (40 mg total) by mouth daily. 02/17/23   Sheron Dixons, MD  pregabalin (LYRICA) 25 MG capsule Take 25 mg by mouth. Patient taking 1 in the AM, 1 at Lunch, and 3 at night.    [provider]  promethazine -dextromethorphan (PROMETHAZINE -DM)  6.25-15 MG/5ML syrup Take 5 mLs by mouth 4 (four) times daily as needed. 05/11/23   Corbin Dess, PA-C  pyridOXINE (VITAMIN B6) 100 MG tablet Take 100 mg by mouth daily.    [provider]  vitamin B-12 (CYANOCOBALAMIN ) 1000 MCG tablet Take 1,000 mcg by mouth daily.    [provider]  zinc gluconate 50 MG tablet Take 50 mg by mouth daily.    [provider]    Family History Family History  Problem Relation Age of Onset   Heart disease Mother    Esophageal cancer Father     Social History Social History   Tobacco Use   Smoking status: Former    Current packs/day: 0.00    Types: Cigarettes    Quit date: 12/29/2001    Years since quitting: 21.5   Smokeless tobacco: Never  Vaping Use   Vaping status: Never Used  Substance Use Topics   Alcohol use: No   Drug use: No     Allergies   Fosamax [alendronate sodium], Lipitor [atorvastatin], Codeine, and Hydrocodone    Review of Systems Review of Systems Per HPI  Physical Exam Triage Vital Signs ED Triage Vitals  Encounter Vitals Group     BP 07/17/23 0921 106/73     Systolic BP Percentile --      Diastolic BP Percentile --      Pulse Rate 07/17/23 0921 87     Resp 07/17/23 0921 20     Temp 07/17/23 0921 99.8 F (37.7 C)     Temp Source 07/17/23 0921 Oral     SpO2 07/17/23 0921 91 %     Weight --      Height --      Head Circumference --      Peak Flow --      Pain Score 07/17/23 0923 0     Pain Loc --      Pain Education --      Exclude from Growth Chart --    No data found.  Updated Vital Signs BP 106/73 (BP Location: Right Arm)   Pulse 87   Temp 99.8 F (37.7 C) (Oral)   Resp 20   SpO2 91%   Visual Acuity Right Eye Distance:   Left Eye Distance:   Bilateral Distance:    Right Eye Near:   Left Eye Near:    Bilateral Near:     Physical Exam Vitals and nursing note reviewed.  Constitutional:      General: She is not in acute distress.    Appearance: Normal  appearance.  HENT:     Head: Normocephalic.     Right Ear: Tympanic membrane, ear canal and external ear normal.     Left Ear: Tympanic membrane, ear canal and external ear normal.     Nose: Congestion present.     Mouth/Throat:     Lips: Pink.     Mouth: Mucous membranes are moist.     Pharynx: Uvula midline. Posterior oropharyngeal erythema and postnasal drip present. No pharyngeal swelling, oropharyngeal exudate or uvula swelling.     Comments: Cobblestoning present to posterior oropharynx  Eyes:     Extraocular Movements: Extraocular movements intact.     Conjunctiva/sclera: Conjunctivae normal.     Pupils: Pupils are equal, round, and reactive to light.  Cardiovascular:     Rate and Rhythm: Normal rate and regular rhythm.     Pulses: Normal pulses.     Heart sounds: Normal heart sounds.  Pulmonary:     Effort: Pulmonary effort is normal. No respiratory distress.     Breath sounds: Normal breath sounds. No stridor. No wheezing, rhonchi or rales.  Abdominal:     General: Bowel sounds are normal.     Palpations: Abdomen is soft.     Tenderness: There is no abdominal tenderness.  Musculoskeletal:     Cervical back: Normal range of motion.  Lymphadenopathy:     Cervical: No cervical adenopathy.  Skin:    General: Skin is warm and dry.  Neurological:     General: No focal deficit present.     Mental Status: She is alert and oriented to person, place, and time.  Psychiatric:        Mood and Affect: Mood normal.        Behavior: Behavior normal.      UC Treatments / Results  Labs (all labs ordered are listed, but only abnormal results are displayed) Labs Reviewed  POC SARS CORONAVIRUS 2 AG -  ED    EKG   Radiology No results found.  Procedures Procedures (including critical care time)  Medications Ordered in UC Medications - No data to display  Initial Impression / Assessment and Plan / UC Course  I have reviewed the triage vital signs and the nursing  notes.  Pertinent labs & imaging results that were available during my care of the patient were reviewed by me and considered in my medical decision making (see chart for details).  COVID test is negative.  On exam, lung sounds are clear throughout.  Patient is well-appearing, she is in no acute distress.  Symptoms consistent with a viral upper respiratory infection with cough.  Will provide symptomatic treatment with benzonatate  200 mg capsules.  Patient states that she has Flonase  at home, advised patient to begin using Flonase .  Also recommended over-the-counter medications such as Delsym or Mucinex.  Supportive care recommendations were provided and discussed with the patient to include fluids, rest, over-the-counter Tylenol , and use of a humidifier during sleep.  Discussed indications with patient regarding follow-up.  Patient was in agreement with this plan of care and verbalizes understanding.  All questions were answered.  Patient stable for discharge.  Final Clinical Impressions(s) / UC Diagnoses   Final diagnoses:  Viral upper respiratory tract infection with cough     Discharge Instructions      The COVID test was negative. Take medication as prescribed. Recommend over-the-counter Mucinex, Delsym, or Flonase  while symptoms persist.  Take medications as directed. May take over-the-counter Tylenol  as needed for pain, fever, or  general discomfort. Also find it helpful to use normal saline nasal spray throughout the day for nasal congestion and runny nose. For your cough, you may find it helpful to sleep elevated on pillows and to use a humidifier at nighttime during sleep. If symptoms have not improved over the next several days, or appear to worsen, you may follow-up in this clinic or with your primary care physician for further evaluation. Follow-up as needed.   ED Prescriptions     Medication Sig Dispense Auth. Provider   benzonatate  (TESSALON ) 200 MG capsule Take 1 capsule  (200 mg total) by mouth 3 (three) times daily as needed for cough. 30 capsule Leath-Warren, Belen Bowers, NP      PDMP not reviewed this encounter.   Hardy Lia, NP 07/17/23 [phone]

## 2023-07-21 ENCOUNTER — Encounter: Payer: Self-pay | Admitting: Internal Medicine

## 2023-07-21 ENCOUNTER — Ambulatory Visit (INDEPENDENT_AMBULATORY_CARE_PROVIDER_SITE_OTHER): Admitting: Internal Medicine

## 2023-07-21 ENCOUNTER — Ambulatory Visit: Admitting: Internal Medicine

## 2023-07-21 VITALS — BP 110/76 | HR 68 | Temp 98.4°F | Ht 61.0 in | Wt 186.1 lb

## 2023-07-21 DIAGNOSIS — H6502 Acute serous otitis media, left ear: Secondary | ICD-10-CM

## 2023-07-21 MED ORDER — AZITHROMYCIN 250 MG PO TABS: ORAL_TABLET | ORAL | 0 refills | Status: AC

## 2023-07-21 NOTE — Progress Notes (Signed)
 Date:  07/21/2023   Name:  Kimberly Andrade   DOB:  12-10-1945   MRN:  [id number]   Chief Complaint: Otalgia (Left ear pain x 3 days, patient said the pain radiates down the side of her head), Nasal Congestion (Started 1 week ago, went to the walk in clinic on Wednesday last week, no fever), and Cough  Otalgia  There is pain in the left ear. This is a new problem. Episode onset: 3 days ago. The problem has been unchanged. There has been no fever. Associated symptoms include coughing and rhinorrhea. Pertinent negatives include no ear discharge or headaches. Treatments tried: Flonase , benzonate for cough.  Cough Associated symptoms include ear pain and rhinorrhea. Pertinent negatives include no chest pain, chills or headaches.    Review of Systems  Constitutional:  Negative for chills and fatigue.  HENT:  Positive for ear pain and rhinorrhea. Negative for ear discharge.   Respiratory:  Positive for cough.   Cardiovascular:  Negative for chest pain.  Neurological:  Negative for headaches.     Lab Results  Component Value Date   NA 140 02/17/2023   K 4.4 02/17/2023   CO2 22 02/17/2023   GLUCOSE 93 02/17/2023   BUN 26 02/17/2023   CREATININE 0.91 02/17/2023   CALCIUM  9.9 02/17/2023   EGFR 65 02/17/2023   GFRNONAA >60 11/24/2022   Lab Results  Component Value Date   CHOL 155 02/17/2023   HDL 50 02/17/2023   LDLCALC 83 02/17/2023   TRIG 123 02/17/2023   CHOLHDL 3.1 02/17/2023   Lab Results  Component Value Date   TSH 2.340 02/17/2023   Lab Results  Component Value Date   HGBA1C 6.0 (H) 02/17/2023   Lab Results  Component Value Date   WBC 6.7 02/17/2023   HGB 13.5 02/17/2023   HCT 42.3 02/17/2023   MCV 89 02/17/2023   PLT 233 02/17/2023   Lab Results  Component Value Date   ALT 16 02/17/2023   AST 17 02/17/2023   ALKPHOS 32 (L) 02/17/2023   BILITOT 0.4 02/17/2023   Lab Results  Component Value Date   VD25OH 42.5 02/13/2022     Patient Active  Problem List   Diagnosis Date Noted   Paroxysmal A-fib (HCC) 01/27/2023   Polyneuropathy 08/21/2022   Bronchiectasis without complication (HCC) 02/13/2022   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   Lymphedema 03/30/2021   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   Essential hypertension 08/16/2019   Morton's neuroma 07/27/2019   Atherosclerosis of abdominal aorta (HCC) 03/16/2018   DDD (degenerative disc disease), 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/2017   Prediabetes 12/25/2016   Nocturnal leg cramps 12/25/2016   Postmenopausal osteoporosis 10/16/2016   Urinary incontinence in female 10/16/2016   Mixed hyperlipidemia 10/05/2015   GERD (gastroesophageal reflux disease) 04/24/2015   Crohn's disease of small intestine without complication (HCC) 04/24/2015   Obstructive sleep apnea of adult 11/18/2013    Allergies  Allergen Reactions   Fosamax [Alendronate Sodium] Other (See Comments)    Bones hurt   Lipitor [Atorvastatin] Other (See Comments)    Bone pain   Codeine Other (See Comments)     Codeine in the liquid form causes gastritis   Hydrocodone  Nausea And Vomiting    Past Surgical History:  Procedure Laterality Date   ABDOMINAL HYSTERECTOMY     APPENDECTOMY     BIOPSY  03/26/2023   Procedure: BIOPSY;  Surgeon: Toledo, Alphonsus Jeans, MD;  Location: [facility];  Service: Gastroenterology;;   Lenice Quill  CATARACT EXTRACTION W/PHACO Right 05/28/2022   Procedure: CATARACT EXTRACTION PHACO AND INTRAOCULAR LENS PLACEMENT (IOC) RIGHT  5.21  00:37.2;  Surgeon: Clair Crews, MD;  Location: [facility];  Service: Ophthalmology;  Laterality: Right;  sleep apnea   CATARACT EXTRACTION W/PHACO Left 06/18/2022   Procedure: CATARACT EXTRACTION PHACO AND INTRAOCULAR LENS PLACEMENT (IOC) LEFT 5.64 00:39.4;  Surgeon: Clair Crews, MD;  Location: [facility];  Service: Ophthalmology;  Laterality: Left;  sleep apnea   CHOLECYSTECTOMY     COLONOSCOPY N/A 03/26/2023   Procedure: COLONOSCOPY;   Surgeon: Toledo, Alphonsus Jeans, MD;  Location: [facility] END[facility];  Service: Gastroenterology;  Laterality: N/A;   COLONOSCOPY WITH PROPOFOL  N/A 03/11/2016   Procedure: COLONOSCOPY WITH PROPOFOL ;  Surgeon: Deveron Fly, MD;  Location: [facility];  Service: Endoscopy;  Laterality: N/A;   DILATION AND CURETTAGE OF UTERUS     ESOPHAGOGASTRODUODENOSCOPY N/A 03/11/2016   Procedure: ESOPHAGOGASTRODUODENOSCOPY (EGD);  Surgeon: Deveron Fly, MD;  Location: [facility] END[facility]COPY;  Service: Endoscopy;  Laterality: N/A;   ESOPHAGOGASTRODUODENOSCOPY (EGD) WITH PROPOFOL  N/A 11/11/2017   Procedure: ESOPHAGOGASTRODUODENOSCOPY (EGD) WITH PROPOFOL ;  Surgeon: Deveron Fly, MD;  Location: [facility] END[facility]COPY;  Service: Endoscopy;  Laterality: N/A;   ESOPHAGOGASTRODUODENOSCOPY (EGD) WITH PROPOFOL  N/A 01/20/2018   Procedure: ESOPHAGOGASTRODUODENOSCOPY (EGD) WITH PROPOFOL ;  Surgeon: Deveron Fly, MD;  Location: [facility] END[facility]COPY;  Service: Endoscopy;  Laterality: N/A;   FOOT SURGERY Right    HAMMER TOE SURGERY     JOINT REPLACEMENT Right 2006   shoulder   KNEE ARTHROSCOPY Right 05/25/2013   Procedure: ARTHROSCOPY KNEE;  Surgeon: Alphonzo Ask, MD;  Location: [facility];  Service: Orthopedics;  Laterality: Right;  medial and lateral meniscal debridment and chondroplasty   RADIOFREQUENCY ABLATION NERVES Bilateral 07/2022   lumbar spine   righti shoulder replacement      ROBOTIC ASSISTED LAPAROSCOPIC REPAIR OF PARAESOPHAGEAL HERNIA  07/16/2018   SHOULDER ARTHROSCOPY WITH ROTATOR CUFF REPAIR AND SUBACROMIAL DECOMPRESSION Left 08/14/2017   Procedure: SHOULDER ARTHROSCOPY WITH ROTATOR CUFF REPAIR AND SUBACROMIAL DECOMPRESSION;  Surgeon: Sammye Cristal, MD;  Location: [facility];  Service: Orthopedics;  Laterality: Left;   SHOULDER SURGERY Right    X 3   TOTAL HIP ARTHROPLASTY Left 11/25/2017   Procedure: LEFT TOTAL HIP ARTHROPLASTY ANTERIOR APPROACH;  Surgeon: Dayne Even, MD;  Location: [facility];   Service: Orthopedics;  Laterality: Left;   TOTAL KNEE ARTHROPLASTY Right 04/18/2020   Procedure: RIGHT TOTAL KNEE ARTHROPLASTY;  Surgeon: Dayne Even, MD;  Location: [facility];  Service: Orthopedics;  Laterality: Right;    Social History   Tobacco Use   Smoking status: Former    Current packs/day: 0.00    Types: Cigarettes    Quit date: 12/29/2001    Years since quitting: 21.5   Smokeless tobacco: Never  Vaping Use   Vaping status: Never Used  Substance Use Topics   Alcohol use: No   Drug use: No     Medication list has been reviewed and updated.  Current Meds  Medication Sig   albuterol  (VENTOLIN  HFA) 108 (90 Base) MCG/ACT inhaler    Ascorbic Acid (VITAMIN C PO) Take 500 mg by mouth daily.   azithromycin  (ZITHROMAX  Z-PAK) 250 MG tablet UAD   benzonatate  (TESSALON ) 200 MG capsule Take 1 capsule (200 mg total) by mouth 3 (three) times daily as needed for cough.   Calcium  Carbonate-Vit D-Min (CALCIUM  600+D3 PLUS MINERALS) 600-800 MG-UNIT TABS Take 1 tablet by mouth daily.   cetirizine  (ZYRTEC ) 5 MG  tablet Take 1 tablet (5 mg total) by mouth daily.   cholecalciferol  (VITAMIN D ) 25 MCG (1000 UNIT) tablet Take 1,000 Units by mouth daily.   ferrous sulfate  324 MG TBEC Take 324 mg by mouth daily with breakfast.   fluticasone  (FLONASE ) 50 MCG/ACT nasal spray Place 1 spray into both nostrils 2 (two) times daily.   losartan  (COZAAR ) 50 MG tablet Take 1 tablet (50 mg total) by mouth daily.   Magnesium  250 MG TABS Take 500 mg by mouth daily.   mesalamine  (PENTASA ) 500 MG CR capsule Take 2,000 mg by mouth 2 (two) times daily as needed (Crohn's).   metoprolol  tartrate (LOPRESSOR ) 25 MG tablet Take 12.5 mg by mouth.   Multiple Vitamins-Minerals (MULTIVITAMIN WITH MINERALS) tablet Take 1 tablet by mouth daily.   NON FORMULARY BiPap nightly   Omega-3 Fatty Acids (FISH OIL PO) Take 1,200 mg by mouth at bedtime.   oxybutynin  (DITROPAN  XL) 15 MG 24 hr tablet Take 1 tablet (15 mg total) by  mouth at bedtime.   pantoprazole  (PROTONIX ) 40 MG tablet Take 40 mg by mouth every morning.   Potassium 99 MG TABS Take 99 mg by mouth daily.    pravastatin  (PRAVACHOL ) 40 MG tablet Take 1 tablet (40 mg total) by mouth daily.   pregabalin (LYRICA) 25 MG capsule Take 25 mg by mouth. Patient taking 1 in the AM, 1 at Lunch, and 3 at night.   promethazine -dextromethorphan (PROMETHAZINE -DM) 6.25-15 MG/5ML syrup Take 5 mLs by mouth 4 (four) times daily as needed.   pyridOXINE (VITAMIN B6) 100 MG tablet Take 100 mg by mouth daily.   vitamin B-12 (CYANOCOBALAMIN ) 1000 MCG tablet Take 1,000 mcg by mouth daily.   zinc gluconate 50 MG tablet Take 50 mg by mouth daily.       07/21/2023    9:03 AM 04/07/2023    9:18 AM 02/17/2023    9:38 AM 08/21/2022    8:52 AM  GAD 7 : Generalized Anxiety Score  Nervous, Anxious, on Edge 0 0 0 0  Control/stop worrying 0 0 0 0  Worry too much - different things 0 0 0 0  Trouble relaxing 0 0 0 0  Restless 0 0 0 0  Easily annoyed or irritable 0 1 1 0  Afraid - awful might happen 0 0 0 0  Total GAD 7 Score 0 1 1 0  Anxiety Difficulty Not difficult at all Not difficult at all Not difficult at all Not difficult at all       07/21/2023    9:03 AM 04/16/2023    9:36 AM 04/07/2023    9:18 AM  Depression screen PHQ 2/9  Decreased Interest 0 0 0  Down, Depressed, Hopeless 0 0 0  PHQ - 2 Score 0 0 0  Altered sleeping 0 0   Tired, decreased energy 0 0   Change in appetite 0 0   Feeling bad or failure about yourself  0 0   Trouble concentrating 0 0   Moving slowly or fidgety/restless 0 0   Suicidal thoughts 0 0   PHQ-9 Score 0 0   Difficult doing work/chores Not difficult at all Not difficult at all     BP Readings from Last 3 Encounters:  07/21/23 110/76  07/17/23 106/73  05/11/23 123/78    Physical Exam Vitals and nursing note reviewed.  Constitutional:      General: She is not in acute distress.    Appearance: She is well-developed. She is ill-appearing.  HENT:     Head: Normocephalic and atraumatic.     Right Ear: No middle ear effusion. Tympanic membrane is not scarred, perforated or erythematous.     Left Ear:  No middle ear effusion. Tympanic membrane is scarred, erythematous and retracted.     Nose:     Right Sinus: Maxillary sinus tenderness present. No frontal sinus tenderness.     Left Sinus: Maxillary sinus tenderness present. No frontal sinus tenderness.     Mouth/Throat:     Mouth: Mucous membranes are moist.     Pharynx: Posterior oropharyngeal erythema present. No oropharyngeal exudate.  Neck:     Vascular: No carotid bruit.  Cardiovascular:     Rate and Rhythm: Regular rhythm. Tachycardia present.     Heart sounds: No murmur heard. Pulmonary:     Effort: Pulmonary effort is normal. No respiratory distress.     Breath sounds: No wheezing or rhonchi.  Musculoskeletal:     Cervical back: Normal range of motion. No rigidity.  Lymphadenopathy:     Cervical: Cervical adenopathy present.  Skin:    General: Skin is warm and dry.     Findings: No rash.  Neurological:     Mental Status: She is alert and oriented to person, place, and time.  Psychiatric:        Mood and Affect: Mood normal.        Behavior: Behavior normal.     Wt Readings from Last 3 Encounters:  07/21/23 186 lb 2 oz (84.4 kg)  04/07/23 185 lb (83.9 kg)  03/26/23 179 lb 9.6 oz (81.5 kg)    BP 110/76   Pulse 68   Temp 98.4 F (36.9 C)   Ht 5\' 1"  (1.549 m)   Wt 186 lb 2 oz (84.4 kg)   SpO2 95%   BMI 35.17 kg/m   Assessment and Plan:  Problem List Items Addressed This Visit   None Visit Diagnoses       Non-recurrent acute serous otitis media of left ear    -  Primary   continue Tylenol  for pain, along with warm compresses continue Mucinrex and Flonase  continue Benzonate   Relevant Medications   azithromycin  (ZITHROMAX  Z-PAK) 250 MG tablet       No follow-ups on file.    Sheron Dixons, MD [facility] [place]

## 2023-07-29 ENCOUNTER — Encounter: Payer: Self-pay | Admitting: Internal Medicine

## 2023-08-07 ENCOUNTER — Ambulatory Visit: Payer: Self-pay | Admitting: Internal Medicine

## 2023-08-19 ENCOUNTER — Ambulatory Visit: Admitting: Internal Medicine

## 2023-09-01 ENCOUNTER — Other Ambulatory Visit: Payer: Self-pay | Admitting: Internal Medicine

## 2023-09-01 DIAGNOSIS — E782 Mixed hyperlipidemia: Secondary | ICD-10-CM

## 2023-09-01 DIAGNOSIS — R32 Unspecified urinary incontinence: Secondary | ICD-10-CM

## 2023-09-01 DIAGNOSIS — I10 Essential (primary) hypertension: Secondary | ICD-10-CM

## 2023-09-02 NOTE — Telephone Encounter (Signed)
 Requested Prescriptions  Pending Prescriptions Disp Refills   losartan  (COZAAR ) 50 MG tablet [Pharmacy Med Name: LOSARTAN  50MG  TAB] 30 tablet 0    Sig: TAKE ONE TABLET BY MOUTH EVERY DAY     Cardiovascular:  Angiotensin Receptor Blockers Failed - 09/02/2023  4:40 PM      Failed - Cr in normal range and within 180 days    Creatinine, Ser  Date Value Ref Range Status  02/17/2023 0.91 0.57 - 1.00 mg/dL Final         Failed - K in normal range and within 180 days    Potassium  Date Value Ref Range Status  02/17/2023 4.4 3.5 - 5.2 mmol/L Final         Passed - Patient is not pregnant      Passed - Last BP in normal range    BP Readings from Last 1 Encounters:  07/21/23 110/76         Passed - Valid encounter within last 6 months    Recent Outpatient Visits           1 month ago Non-recurrent acute serous otitis media of left ear   [facility], Leita DEL, [place]   4 months ago Acute bronchitis, unspecified organism   [facility] at [facility], [place]io SQUIBB, [place]               pravastatin  (PRAVACHOL ) 40 MG tablet [Pharmacy Med Name: PRAVASTATIN  NA 40MG  TAB] 90 tablet 0    Sig: TAKE ONE TABLET BY MOUTH EVERY DAY     Cardiovascular:  Antilipid - Statins Failed - 09/02/2023  4:40 PM      Failed - Lipid Panel in normal range within the last 12 months    Cholesterol, Total  Date Value Ref Range Status  02/17/2023 155 100 - 199 mg/dL Final   LDL Chol Calc (NIH)  Date Value Ref Range Status  02/17/2023 83 0 - 99 mg/dL Final   HDL  Date Value Ref Range Status  02/17/2023 50 >39 mg/dL Final   Triglycerides  Date Value Ref Range Status  02/17/2023 123 0 - 149 mg/dL Final         Passed - Patient is not pregnant      Passed - Valid encounter within last 12 months    Recent Outpatient Visits           1 month ago Non-recurrent acute serous otitis media of left ear   [facility] at [facility], Leita DEL, MD   4 months ago Acute bronchitis, unspecified organism   [facility] at [facility], [place], [place]               oxybutynin  (DITROPAN  XL) 15 MG 24 hr tablet [Pharmacy Med Name: OXYBUTYNIN  CHLORIDE 15MG  SA TAB] 90 tablet 0    Sig: TAKE ONE TABLET BY MOUTH EVERY DAY AT BEDTIME     Urology:  Bladder Agents Passed - 09/02/2023  4:40 PM      Passed - Valid encounter within last 12 months    Recent Outpatient Visits           1 month ago Non-recurrent acute serous otitis media of left ear   [facility] at [facility], Leita DEL, [place]   4 months  ago Acute bronchitis, unspecified organism   [facility] at [facility], [place]QU[place], [place]

## 2023-09-03 ENCOUNTER — Ambulatory Visit (INDEPENDENT_AMBULATORY_CARE_PROVIDER_SITE_OTHER): Admitting: Internal Medicine

## 2023-09-03 ENCOUNTER — Encounter: Payer: Self-pay | Admitting: Internal Medicine

## 2023-09-03 VITALS — BP 122/78 | HR 99 | Ht 61.0 in | Wt 188.0 lb

## 2023-09-03 DIAGNOSIS — I10 Essential (primary) hypertension: Secondary | ICD-10-CM

## 2023-09-03 DIAGNOSIS — E782 Mixed hyperlipidemia: Secondary | ICD-10-CM | POA: Diagnosis not present

## 2023-09-03 DIAGNOSIS — R32 Unspecified urinary incontinence: Secondary | ICD-10-CM

## 2023-09-03 DIAGNOSIS — R7303 Prediabetes: Secondary | ICD-10-CM

## 2023-09-03 DIAGNOSIS — Z1231 Encounter for screening mammogram for malignant neoplasm of breast: Secondary | ICD-10-CM

## 2023-09-03 DIAGNOSIS — J479 Bronchiectasis, uncomplicated: Secondary | ICD-10-CM

## 2023-09-03 DIAGNOSIS — K5000 Crohn's disease of small intestine without complications: Secondary | ICD-10-CM

## 2023-09-03 DIAGNOSIS — I480 Paroxysmal atrial fibrillation: Secondary | ICD-10-CM

## 2023-09-03 MED ORDER — LOSARTAN POTASSIUM 50 MG PO TABS: 50.0000 mg | ORAL_TABLET | Freq: Every day | ORAL | 1 refills | Status: DC

## 2023-09-03 MED ORDER — PRAVASTATIN SODIUM 40 MG PO TABS
40.0000 mg | ORAL_TABLET | Freq: Every day | ORAL | 1 refills | Status: AC
Start: 2023-09-03 — End: ?

## 2023-09-03 MED ORDER — OXYBUTYNIN CHLORIDE ER 15 MG PO TB24: 15.0000 mg | ORAL_TABLET | Freq: Every day | ORAL | 1 refills | Status: DC

## 2023-09-03 NOTE — Assessment & Plan Note (Signed)
 Blood pressure is well controlled.  Current medications are losartan  50 mg. Will continue same regimen along with efforts to limit dietary sodium.

## 2023-09-03 NOTE — Assessment & Plan Note (Signed)
 No evidence of disease on the last 2 studies Last colonoscopy 03/2023 - no further studies needed.

## 2023-09-03 NOTE — Assessment & Plan Note (Signed)
 She is being followed by Cardiology. Having an ablation and Watchman device later this month.

## 2023-09-03 NOTE — Assessment & Plan Note (Signed)
 Managed with diet only. Lab Results  Component Value Date   HGBA1C 6.0 (H) 02/17/2023

## 2023-09-03 NOTE — Patient Instructions (Signed)
 Call [facility] to schedule your mammogram at [phone].

## 2023-09-03 NOTE — Assessment & Plan Note (Signed)
 Bladder control is good on Ditropan .

## 2023-09-03 NOTE — Progress Notes (Signed)
 Date:  09/03/2023   Name:  Kimberly Andrade   DOB:  [birth year]   MRN:  [id number]   Chief Complaint: Hypertension, Hyperlipidemia, and Urinary Incontinence  Hypertension This is a chronic problem. The problem is controlled. Associated symptoms include shortness of breath. Pertinent negatives include no chest pain, headaches or palpitations. Past treatments include angiotensin blockers. The current treatment provides significant improvement.  Hyperlipidemia This is a chronic problem. Associated symptoms include shortness of breath. Pertinent negatives include no chest pain or myalgias. Current antihyperlipidemic treatment includes statins. The current treatment provides significant improvement of lipids.  Atrial fibrillation - she is still struggling with Afib - having an ablation later this month with placement of a Watchman device. OAB - well controlled on Ditropan .  No s/s of infection, no excessive dry mouth.  Review of Systems  Constitutional:  Negative for chills, fatigue and unexpected weight change.  HENT:  Negative for trouble swallowing.   Eyes:  Negative for visual disturbance.  Respiratory:  Positive for shortness of breath. Negative for cough, chest tightness and wheezing.   Cardiovascular:  Negative for chest pain, palpitations and leg swelling.  Gastrointestinal:  Negative for abdominal pain, constipation and diarrhea.  Genitourinary:  Positive for frequency and urgency.  Musculoskeletal:  Positive for arthralgias and gait problem. Negative for myalgias.  Neurological:  Negative for dizziness, weakness, light-headedness and headaches.  Psychiatric/Behavioral:  Negative for dysphoric mood and sleep disturbance. The patient is not nervous/anxious.      Lab Results  Component Value Date   NA 140 02/17/2023   K 4.4 02/17/2023   CO2 22 02/17/2023   GLUCOSE 93 02/17/2023   BUN 26 02/17/2023   CREATININE 0.91 02/17/2023   CALCIUM  9.9 02/17/2023   EGFR 65 02/17/2023    GFRNONAA >60 11/24/2022   Lab Results  Component Value Date   CHOL 155 02/17/2023   HDL 50 02/17/2023   LDLCALC 83 02/17/2023   TRIG 123 02/17/2023   CHOLHDL 3.1 02/17/2023   Lab Results  Component Value Date   TSH 2.340 02/17/2023   Lab Results  Component Value Date   HGBA1C 6.0 (H) 02/17/2023   Lab Results  Component Value Date   WBC 6.7 02/17/2023   HGB 13.5 02/17/2023   HCT 42.3 02/17/2023   MCV 89 02/17/2023   PLT 233 02/17/2023   Lab Results  Component Value Date   ALT 16 02/17/2023   AST 17 02/17/2023   ALKPHOS 32 (L) 02/17/2023   BILITOT 0.4 02/17/2023   Lab Results  Component Value Date   VD25OH 42.5 02/13/2022     Patient Active Problem List   Diagnosis Date Noted   Paroxysmal A-fib (HCC) 01/27/2023   Polyneuropathy 08/21/2022   Bronchiectasis without complication (HCC) 02/13/2022   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   Lymphedema 03/30/2021   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   Essential hypertension 08/16/2019   Morton's neuroma 07/27/2019   Atherosclerosis of abdominal aorta (HCC) 03/16/2018   DDD (degenerative disc disease), 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/2017   Prediabetes 12/25/2016   Nocturnal leg cramps 12/25/2016   Postmenopausal osteoporosis 10/16/2016   Urinary incontinence in female 10/16/2016   Mixed hyperlipidemia 10/05/2015   GERD (gastroesophageal reflux disease) 04/24/2015   Crohn's disease of small intestine without complication (HCC) 04/24/2015   Obstructive sleep apnea of adult 11/18/2013    Allergies  Allergen Reactions   Fosamax [Alendronate Sodium] Other (See Comments)    Bones hurt   Lipitor [Atorvastatin] Other (See Comments)  Bone pain   Codeine Other (See Comments)     Codeine in the liquid form causes gastritis   Hydrocodone  Nausea And Vomiting    Past Surgical History:  Procedure Laterality Date   ABDOMINAL HYSTERECTOMY     APPENDECTOMY     BIOPSY  03/26/2023   Procedure: BIOPSY;  Surgeon: Aundria, Ladell POUR, MD;   Location: [facility] ENDOSCOPY;  Service: Gastroenterology;;   REDELL MEDIATE     CATARACT EXTRACTION W/PHACO Right 05/28/2022   Procedure: CATARACT EXTRACTION PHACO AND INTRAOCULAR LENS PLACEMENT (IOC) RIGHT  5.21  00:37.2;  Surgeon: Jaye Fallow, MD;  Location: [facility]ERY [facility];  Service: Ophthalmology;  Laterality: Right;  sleep apnea   CATARACT EXTRACTION W/PHACO Left 06/18/2022   Procedure: CATARACT EXTRACTION PHACO AND INTRAOCULAR LENS PLACEMENT (IOC) LEFT 5.64 00:39.4;  Surgeon: Jaye Fallow, MD;  Location: [facility];  Service: Ophthalmology;  Laterality: Left;  sleep apnea   CHOLECYSTECTOMY     COLONOSCOPY N/A 03/26/2023   Procedure: COLONOSCOPY;  Surgeon: Toledo, Ladell POUR, MD;  Location: [facility] ENDOSCOPY;  Service: Gastroenterology;  Laterality: N/A;   COLONOSCOPY WITH PROPOFOL  N/A 03/11/2016   Procedure: COLONOSCOPY WITH PROPOFOL ;  Surgeon: Gladis RAYMOND Mariner, MD;  Location: [facility] ENDOSCOPY;  Service: Endoscopy;  Laterality: N/A;   DILATION AND CURETTAGE OF UTERUS     ESOPHAGOGASTRODUODENOSCOPY N/A 03/11/2016   Procedure: ESOPHAGOGASTRODUODENOSCOPY (EGD);  Surgeon: Gladis RAYMOND Mariner, MD;  Location: [facility] ENDOSCOPY;  Service: Endoscopy;  Laterality: N/A;   ESOPHAGOGASTRODUODENOSCOPY (EGD) WITH PROPOFOL  N/A 11/11/2017   Procedure: ESOPHAGOGASTRODUODENOSCOPY (EGD) WITH PROPOFOL ;  Surgeon: Mariner Gladis RAYMOND, MD;  Location: [facility] ENDOSCOPY;  Service: Endoscopy;  Laterality: N/A;   ESOPHAGOGASTRODUODENOSCOPY (EGD) WITH PROPOFOL  N/A 01/20/2018   Procedure: ESOPHAGOGASTRODUODENOSCOPY (EGD) WITH PROPOFOL ;  Surgeon: Mariner Gladis RAYMOND, MD;  Location: [facility] ENDOSCOPY;  Service: Endoscopy;  Laterality: N/A;   FOOT SURGERY Right    HAMMER TOE SURGERY     JOINT REPLACEMENT Right 2006   shoulder   KNEE ARTHROSCOPY Right 05/25/2013   Procedure: ARTHROSCOPY KNEE;  Surgeon: Maude KANDICE Herald, MD;  Location: [facility];  Service: Orthopedics;  Laterality: Right;  medial and  lateral meniscal debridment and chondroplasty   RADIOFREQUENCY ABLATION NERVES Bilateral 07/2022   lumbar spine   righti shoulder replacement      ROBOTIC ASSISTED LAPAROSCOPIC REPAIR OF PARAESOPHAGEAL HERNIA  07/16/2018   SHOULDER ARTHROSCOPY WITH ROTATOR CUFF REPAIR AND SUBACROMIAL DECOMPRESSION Left 08/14/2017   Procedure: SHOULDER ARTHROSCOPY WITH ROTATOR CUFF REPAIR AND SUBACROMIAL DECOMPRESSION;  Surgeon: Dozier Soulier, MD;  Location: [facility] OR;  Service: Orthopedics;  Laterality: Left;   SHOULDER SURGERY Right    X 3   TOTAL HIP ARTHROPLASTY Left 11/25/2017   Procedure: LEFT TOTAL HIP ARTHROPLASTY ANTERIOR APPROACH;  Surgeon: Herald Maude, MD;  Location: [facility] OR;  Service: Orthopedics;  Laterality: Left;   TOTAL KNEE ARTHROPLASTY Right 04/18/2020   Procedure: RIGHT TOTAL KNEE ARTHROPLASTY;  Surgeon: Herald Maude, MD;  Location: [facility] ORS;  Service: Orthopedics;  Laterality: Right;    Social History   Tobacco Use   Smoking status: Former    Current packs/day: 0.00    Types: Cigarettes    Quit date: 12/29/2001    Years since quitting: 21.6   Smokeless tobacco: Never  Vaping Use   Vaping status: Never Used  Substance Use Topics   Alcohol use: No   Drug use: No     Medication list has been reviewed and updated.  Current Meds  Medication Sig   albuterol  (VENTOLIN   HFA) 108 (90 Base) MCG/ACT inhaler    apixaban  (ELIQUIS ) 5 MG TABS tablet Take 1 tablet (5 mg total) by mouth 2 (two) times daily.   Ascorbic Acid (VITAMIN C PO) Take 500 mg by mouth daily.   benzonatate  (TESSALON ) 200 MG capsule Take 1 capsule (200 mg total) by mouth 3 (three) times daily as needed for cough.   Calcium  Carbonate-Vit D-Min (CALCIUM  600+D3 PLUS MINERALS) 600-800 MG-UNIT TABS Take 1 tablet by mouth daily.   cetirizine  (ZYRTEC ) 5 MG tablet Take 1 tablet (5 mg total) by mouth daily.   cholecalciferol  (VITAMIN D ) 25 MCG (1000 UNIT) tablet Take 1,000 Units by mouth daily.   ferrous sulfate  324 MG TBEC  Take 324 mg by mouth daily with breakfast.   fluticasone  (FLONASE ) 50 MCG/ACT nasal spray Place 1 spray into both nostrils 2 (two) times daily.   Magnesium  250 MG TABS Take 500 mg by mouth daily.   mesalamine  (PENTASA ) 500 MG CR capsule Take 2,000 mg by mouth 2 (two) times daily as needed (Crohn's).   metoprolol  tartrate (LOPRESSOR ) 25 MG tablet Take 12.5 mg by mouth.   Multiple Vitamins-Minerals (MULTIVITAMIN WITH MINERALS) tablet Take 1 tablet by mouth daily.   NON FORMULARY BiPap nightly   Omega-3 Fatty Acids (FISH OIL PO) Take 1,200 mg by mouth at bedtime.   pantoprazole  (PROTONIX ) 40 MG tablet Take 40 mg by mouth every morning.   Potassium 99 MG TABS Take 99 mg by mouth daily.    pregabalin (LYRICA) 25 MG capsule Take 25 mg by mouth. Patient taking 1 in the AM, 1 at Lunch, and 3 at night.   promethazine -dextromethorphan (PROMETHAZINE -DM) 6.25-15 MG/5ML syrup Take 5 mLs by mouth 4 (four) times daily as needed.   pyridOXINE (VITAMIN B6) 100 MG tablet Take 100 mg by mouth daily.   vitamin B-12 (CYANOCOBALAMIN ) 1000 MCG tablet Take 1,000 mcg by mouth daily.   zinc gluconate 50 MG tablet Take 50 mg by mouth daily.   [DISCONTINUED] losartan  (COZAAR ) 50 MG tablet TAKE ONE TABLET BY MOUTH EVERY DAY   [DISCONTINUED] oxybutynin  (DITROPAN  XL) 15 MG 24 hr tablet TAKE ONE TABLET BY MOUTH EVERY DAY AT BEDTIME   [DISCONTINUED] pravastatin  (PRAVACHOL ) 40 MG tablet TAKE ONE TABLET BY MOUTH EVERY DAY       09/03/2023    3:02 PM 07/21/2023    9:03 AM 04/07/2023    9:18 AM 02/17/2023    9:38 AM  GAD 7 : Generalized Anxiety Score  Nervous, Anxious, on Edge 0 0 0 0  Control/stop worrying 0 0 0 0  Worry too much - different things 0 0 0 0  Trouble relaxing 0 0 0 0  Restless 0 0 0 0  Easily annoyed or irritable 0 0 1 1  Afraid - awful might happen 0 0 0 0  Total GAD 7 Score 0 0 1 1  Anxiety Difficulty Not difficult at all Not difficult at all Not difficult at all Not difficult at all       09/03/2023     3:02 PM 07/21/2023    9:03 AM 04/16/2023    9:36 AM  Depression screen PHQ 2/9  Decreased Interest 0 0 0  Down, Depressed, Hopeless 0 0 0  PHQ - 2 Score 0 0 0  Altered sleeping 0 0 0  Tired, decreased energy 0 0 0  Change in appetite 0 0 0  Feeling bad or failure about yourself  0 0 0  Trouble concentrating 0 0 0  Moving slowly or fidgety/restless 0 0 0  Suicidal thoughts 0 0 0  PHQ-9 Score 0 0 0  Difficult doing work/chores Not difficult at all Not difficult at all Not difficult at all    BP Readings from Last 3 Encounters:  09/03/23 122/78  07/21/23 110/76  07/17/23 106/73    Physical Exam Vitals and nursing note reviewed.  Constitutional:      General: She is not in acute distress.    Appearance: Normal appearance. She is well-developed.  HENT:     Head: Normocephalic and atraumatic.  Cardiovascular:     Rate and Rhythm: Normal rate and regular rhythm.  Pulmonary:     Effort: Pulmonary effort is normal. No respiratory distress.     Breath sounds: No wheezing or rhonchi.  Musculoskeletal:     Cervical back: Normal range of motion.  Skin:    General: Skin is warm and dry.     Findings: No rash.  Neurological:     Mental Status: She is alert and oriented to person, place, and time.  Psychiatric:        Mood and Affect: Mood normal.        Behavior: Behavior normal.     Wt Readings from Last 3 Encounters:  09/03/23 188 lb (85.3 kg)  07/21/23 186 lb 2 oz (84.4 kg)  04/07/23 185 lb (83.9 kg)    BP 122/78   Pulse 99   Ht 5' 1 (1.549 m)   Wt 188 lb (85.3 kg)   SpO2 95%   BMI 35.52 kg/m   Assessment and Plan:  Problem List Items Addressed This Visit       Unprioritized   Prediabetes - Primary (Chronic)   Managed with diet only. Lab Results  Component Value Date   HGBA1C 6.0 (H) 02/17/2023         Mixed hyperlipidemia (Chronic)   LDL is  Lab Results  Component Value Date   LDLCALC 83 02/17/2023   Current regimen is pravachol .  No medication  side effects noted. Goal LDL is <70.       Relevant Medications   losartan  (COZAAR ) 50 MG tablet   pravastatin  (PRAVACHOL ) 40 MG tablet   Crohn's disease of small intestine without complication (HCC) (Chronic)   No evidence of disease on the last 2 studies Last colonoscopy 03/2023 - no further studies needed.      Urinary incontinence in female (Chronic)   Bladder control is good on Ditropan .      Relevant Medications   oxybutynin  (DITROPAN  XL) 15 MG 24 hr tablet   Essential hypertension (Chronic)   Blood pressure is well controlled.  Current medications are losartan  50 mg. Will continue same regimen along with efforts to limit dietary sodium.       Relevant Medications   losartan  (COZAAR ) 50 MG tablet   pravastatin  (PRAVACHOL ) 40 MG tablet   Paroxysmal A-fib (HCC) (Chronic)   She is being followed by Cardiology. Having an ablation and Watchman device later this month.      Relevant Medications   losartan  (COZAAR ) 50 MG tablet   pravastatin  (PRAVACHOL ) 40 MG tablet   Bronchiectasis without complication (HCC)   Mild chronic shortness of breath unchanged.      Other Visit Diagnoses       Encounter for screening mammogram for breast cancer       Relevant Orders   MM 3D SCREENING MAMMOGRAM BILATERAL BREAST       No follow-ups on file.  Leita HILARIO Adie, MD [facility] [place]

## 2023-09-03 NOTE — Assessment & Plan Note (Signed)
 LDL is  Lab Results  Component Value Date   LDLCALC 83 02/17/2023   Current regimen is pravachol .  No medication side effects noted. Goal LDL is <70.

## 2023-09-03 NOTE — Assessment & Plan Note (Signed)
 Mild chronic shortness of breath unchanged.

## 2023-10-14 ENCOUNTER — Ambulatory Visit
Admission: RE | Admit: 2023-10-14 | Discharge: 2023-10-14 | Disposition: A | Discharge status: Home / Self Care | Source: Ambulatory Visit | Attending: Internal Medicine | Admitting: Internal Medicine

## 2023-10-14 DIAGNOSIS — Z1231 Encounter for screening mammogram for malignant neoplasm of breast: Secondary | ICD-10-CM | POA: Insufficient documentation

## 2023-10-29 NOTE — Discharge Summary (Signed)
 [facility]                      Electrophysiology Discharge Summary   Admit Date: 10/29/2023  Discharge Date: 10/30/2023  Admitting Physician: Franky Morna Ned, MD  Discharge Physician: WENDELL BOONE SKATES, MD  Primary Care Provider: Justus Leita Mettle, MD  Primary Electrophysiologist: Dr. 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 *     Anticipatory Guidance (key med changes, results pending, future labs, IV therapies):   - s/p PVI (Affera) and watchman - Resume Eliquis  until TEE follow-up - TEE to be scheduled at [facility] (requested) - EP follow-up 9/30 as below      Cardiac Rehab: not indicated for this procedure  Patient Discharge Instructions:   Other activity instructions:  Order Comments: See attached instructions.   May shower, but no soaking tub baths  Order Comments: See attached instructions.   If you smoke (or have smoked within the last year), we strongly recommend that you do not smoke.   Notify cardiology provider of chest pain   Notify provider of swelling in arms, legs, or stomach   Notify provider of bleeding or swelling at groin cath access site   Notify provider temperature greater than 101.0 F (38.3 C) degrees   Notify provider of dizziness or passing out   Notify provider of weight gain greater than 2 lbs in 1 day or 5 lbs in 1 week   Notify provider of  difficulty breathing or shortness of breath   Report questions or concerns to the [facility] at [phone]   Notify primary care physician of other symptoms   For a life-threatening emergency, call 911   Follow-up with Primary Care Provider   Follow-up with Cardiology   Low cholesterol, low fat    [facility] Provider Follow-up: Future Appointments  Date Time Provider Department Center  12/02/2023 11:00 AM Alluri, Keller Grist, MD [facility] M[facility]L [facility]  12/25/2023  8:30 AM Solum, Therisa Setter, MD [facility] MARYL C  02/05/2024  8:45 AM Thomasena Sherran Angles, PA [facility] MARYL C  02/11/2024 10:00 AM Theotis Lavelle BRAVO, MD [facility] MARYL C  03/11/2024 10:15 AM Alluri, Keller Grist, MD [facility] MARYL BROCKS    Non-[facility] Provider Follow-up: none  Report Issues: By using [facility], or by calling the [facility] at [phone].  For urgent issues or after business hours (after 5pm on weekdays and anytime on weekends), call the [facility] Operator [phone] and ask to page the on-call cardiologist.    Allergies/Intolerances:  Allergies  Allergen Reactions  . Alendronate Sodium Other (See Comments)    Bones hurt  . Atorvastatin Other (See Comments)    Bone pain Bone pain  . Fosamax [Alendronate] Other (See Comments)    Bone pain  . Norco [Hydrocodone -Acetaminophen ] Nausea  . Codeine Other (See Comments)    Upset stomach  Codeine in the liquid form  causes gastritis  . Hydrocodone  Nausea And Vomiting     Medications:     Discharge Medications     Modified Medications      Details  pregabalin 25 MG capsule Commonly known as: LYRICA What changed: additional instructions  Take 50 mg (2 tabs) in the morning and 75 mg (3 tabs) at night Refills: 0       Medications To Continue      Details  albuterol  MDI (PROVENTIL , VENTOLIN , PROAIR ) HFA 90 mcg/actuation inhaler  2 inhalations, Inhalation, Every 6 hours PRN Quantity: 18 g Refills:  5   apixaban  5 mg tablet Commonly known as: ELIQUIS   5 mg, Oral, 2 times Daily Quantity: 60 tablet Refills: 11   ARNUITY ELLIPTA 100 mcg/actuation Dsdv Generic drug: fluticasone  furoate  1 spray, Inhalation, Daily Quantity: 1 each Refills: 3   calcium  carbonate-vit D3-min 600 mg calcium - 200 unit Tab  1 each, Nightly Refills: 0   cyanocobalamin  1,000 mcg SL tablet Commonly known as: VITAMIN B12  1,000 mcg Refills: 0   ferrous sulfate  325 (65 FE) MG tablet  325 mg, Every other day Refills: 0   fluticasone  propionate 50 mcg/actuation nasal spray Commonly known as: FLONASE   1 spray, 2 times Daily Refills: 0   losartan  50 MG tablet Commonly known as: COZAAR   50 mg, Nightly Refills: 0   magnesium  250 mg Tab  250 mg, Daily Refills: 0   mesalamine  500 mg CR capsule Commonly known as: PENTASA   1,000 mg, Oral, 2 times Daily PRN Refills: 0   metoprolol  TARTrate 25 MG tablet Commonly known as: LOPRESSOR   12.5 mg, Oral, 2 times Daily Quantity: 60 tablet Refills: 2   multivitamin capsule  1 capsule, Daily Refills: 0   oxyBUTYnin  15 MG XL tablet Commonly known as: DITROPAN  XL  15 mg, Nightly Refills: 0   potassium 99 mg Tab  1 tablet, Daily Refills: 0   pravastatin  40 MG tablet Commonly known as: PRAVACHOL   40 mg, Oral, Nightly Quantity: 90 tablet Refills: 4          Anticoagulation: Prescribed INR goal: Not applicable   Brief History of Present Illness: Per the H&P dated on 10/29/2023: 78 year old female with PMHx of hypertension, hyperlipidemia, stable angina pectoris, obstructive sleep apnea on CPAP, abdominal aortic atherosclerosis, persistent a-fib on eliquis  who presents for Afib ablation (Affera) and Watchman implant.  __________  Hospital Course by Problem:   On 10/29/2023, Dr. Franky Ned and Dr. Prentice Netters proceeded with catheter ablation of Afib and Watchman implant. Patient will resume metoprolol  for rate/rhythm control and Eliquis   for anticoagulation As well as implantation of left atrial appendage closure device. Please see operative note for details. Patient tolerated the procedure well and had no immediate post-procedure complications. 24 hour telemetry showed normal sinus rhythm. At time of discharge, patient appeared euvolemic without complaints of chest pain, SOB, N/V, F/C, urinary retention, and no evidence of groin bleed. Discharged to home in stable condition.   Comorbid Conditions:           Imaging and Procedures Performed:   Op Note 10/29/23 Summary: Atrial fibrillation on arrival   Successful transseptal puncture x 1 with ICE guidance  3-D mapping of the LA and PV's fused to CT scan Successful PV isolation (WACA) and PWI using PFA with MDT Sphere 9 Affera system  Successful DCCV with 300J from atrial fibrillation to normal sinus rhythm Successful implant of 27 mm Watchman FLX Pro device   Recommendations: Bedrest x  6 hours Remove Figure of 8 stitch at end of bedrest Anti-thrombotic: apixaban  5 mg BID until follow up Anti-arrhythmic: none Rate control: continue home metoprolol  Discharge Plan: tomorrow EP f/u to be scheduled  CXR 10/30/2023: 1. Interval placement of left atrial appendage closure device. Cardiac and mediastinal contours are stable. 2. Lung volumes are reduced with associated bronchovascular crowding. Trace pleural effusions best seen on the lateral projection.  _____________________  Discharge Exam:  Admission Weight: 86.3 kg (190 lb 4.1 oz)  Discharge Weight: Weight: 88.2 kg (194 lb 7.1 oz) BMI: Body mass index is 36.76 kg/m. BP 121/53   Pulse 87   Temp 36.9 C (98.4 F) (Oral)   Resp 17   Ht 154.9 cm (5' 0.98)   Wt 88.2 kg (194 lb 7.1 oz)   SpO2 92%   BMI 36.76 kg/m   General: alert, cooperative, and in NAD Respiratory: regular rate, symmetric, unlabored, clear to auscultation bilaterally, and no accessory muscle use Cardiac: regular rate, regular rhythm, S1, S2  present, no murmur, no rub, no gallop, and JVD non-elevated Abdomen: normal bowel sounds, soft, nontender, and nondistended Extremities: extremities warm and well perfused, no clubbing or cyanosis, no edema, and distal pulses intact right groin site stable with good pulses, no hematoma or bruit Lines: none  Pertinent Lab Testing:  BMP: Recent Labs  Lab 10/30/23 0440  NA 136  K 4.4  CL 103  CO2 20*  BUN 16  CREATININE 0.9  GLUCOSE 172*  CALCIUM  9.3  MG 1.8   CBC: Recent Labs  Lab 10/30/23 0440  WBC 7.4  HGB 11.4*  HCT 33.7*  PLT 232   INR: Recent Labs  Lab 10/28/23 1223  INR 1.1    TFTs: No results for input(s): TSH, T4FREE in the last 168 hours.       Other Pertinent Labs:  None  _____________________  Time spent on discharge process: >30 minutes  MICHELLE CASTILE MARTWICK, NP  ------------------------------------------------------------------------------- Attestation signed by Wendell Boone Skates, MD at 10/30/2023  1:04 PM Attestation Statement:   I personally saw the patient and performed a substantive portion of the medical decision making, in conjunction with the Advanced Practice Provider for the condition/treatment of Ms. Giblin-Hatch.  78 year old woman who underwent A-fib ablation plus watchman implantation with Dr. Franky Ned on 10/29/2023.  Her postoperative course was uncomplicated.  She remains in normal sinus rhythm, exercise within normal limits.  Plan for Eliquis  until TEE follow-up in 45 days.  Patient to follow-up with Dr. Ned as an outpatient.  ZAK AUSTIN LORING, MD  -------------------------------------------------------------------------------

## 2023-10-30 DIAGNOSIS — Z95818 Presence of other cardiac implants and grafts: Secondary | ICD-10-CM | POA: Insufficient documentation

## 2023-10-30 DIAGNOSIS — Z9889 Other specified postprocedural states: Secondary | ICD-10-CM | POA: Insufficient documentation

## 2023-10-31 ENCOUNTER — Telehealth: Payer: Self-pay | Admitting: *Deleted

## 2023-10-31 NOTE — Transitions of Care (Post Inpatient/ED Visit) (Signed)
 10/31/2023  Name: Kimberly Andrade MRN: [id number] DOB: 12-28-1945  Today's TOC FU Call Status: Today's TOC FU Call Status:: Successful TOC FU Call Completed TOC FU Call Complete Date: 10/31/23 Patient's Name and Date of Birth confirmed.  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  A little weak after the procedure, but thats to be expected.  Don't need to follow up with Dr. Justus- they told me to see the cardiologist in one month) Any questions or concerns?: No  Items Reviewed: Did you receive and understand the discharge instructions provided?: Yes (briefly reviewed with patient who verbalizes good understanding of same - outside hospital AVS) Medications obtained,verified, and reconciled?: No (Declined medication review; confirmed no newly Rx'd medications post- hospital discharge; self-manages medications and denies questions/ concerns around medications today) Medications Not Reviewed Reasons:: Other: (patient declined medication review today) Any new allergies since your discharge?: No Dietary orders reviewed?: Yes Type of Diet Ordered:: Heart Healthy/ Low salt Do you have support at home?: Yes People in Home [RPT]: spouse Name of Support/Comfort Primary Source: Reports independent in self-care activities; resides with supportive spouse- assists as/ if needed/ indicated  Medications Reviewed Today: Medications Reviewed Today     Reviewed by Tanina Barb M, RN (Registered Nurse) on 10/31/23 at [phone]  Med List Status: <None>   Medication Order Taking? Sig Documenting Provider Last Dose Status  Informant  albuterol  (VENTOLIN  HFA) 108 (90 Base) MCG/ACT inhaler [id number]   [provider]  Active   apixaban  (ELIQUIS ) 5 MG TABS tablet [id number]  Take 1 tablet (5 mg total) by mouth 2 (two) times daily. Kimberly Rover, MD  Expired 09/03/23 2359   Ascorbic Acid (VITAMIN C PO) [id number]  Take 500 mg by mouth daily. [provider]  Active Self  benzonatate  (TESSALON ) 200 MG capsule [id number]  Take 1 capsule (200 mg total) by mouth 3 (three) times daily as needed for cough. Leath-Warren, Etta PARAS, NP  Active   Calcium  Carbonate-Vit D-Min (CALCIUM  600+D3 PLUS MINERALS) 600-800 MG-UNIT TABS [id number]  Take 1 tablet by mouth daily. [provider]  Active Self  cetirizine  (ZYRTEC ) 5 MG tablet [id number]  Take 1 tablet (5 mg total) by mouth daily. Kimberly Andrade, [place]  Active   cholecalciferol  (VITAMIN D ) 25 MCG (1000 UNIT) tablet [id number]  Take 1,000 Units by mouth daily. [provider]  Active Self  ferrous sulfate  324 MG TBEC [id number]  Take 324 mg by mouth daily with breakfast. [provider]  Active   fluticasone  (FLONASE ) 50 MCG/ACT nasal spray [id number]  Place 1 spray into both nostrils 2 (two) times daily. Kimberly Andrade, [place]  Active   losartan  (COZAAR ) 50 MG tablet [id number]  Take 1 tablet (50 mg total) by mouth daily. Kimberly Leita DEL, MD  Active   Magnesium  250 MG TABS [id number]  Take 500 mg by mouth daily. [provider]  Active Self  mesalamine  (PENTASA ) 500 MG CR capsule [id number]  Take 2,000 mg by mouth 2 (two) times daily as needed (Crohn's). [provider]  Active Self  metoprolol  tartrate (LOPRESSOR ) 25 MG tablet [id number]  Take 12.5 mg by mouth. [provider]  Active   Multiple Vitamins-Minerals (MULTIVITAMIN WITH MINERALS) tablet  [id number]  Take 1 tablet by mouth daily. [provider]  Active Self  NON FORMULARY [id number]  BiPap nightly [provider]  Active Self   Omega-3 Fatty Acids (FISH OIL PO) [id number]  Take 1,200 mg by mouth at bedtime. [provider]  Active   oxybutynin  (DITROPAN  XL) 15 MG 24 hr tablet [id number]  Take 1 tablet (15 mg total) by mouth at bedtime. Kimberly Leita DEL, MD  Active   pantoprazole  (PROTONIX ) 40 MG tablet [id number]  Take 40 mg by mouth every morning. [provider]  Active   Potassium 99 MG TABS [id number]  Take 99 mg by mouth daily.  [provider]  Active Self  pravastatin  (PRAVACHOL ) 40 MG tablet [id number]  Take 1 tablet (40 mg total) by mouth daily. Kimberly Leita DEL, MD  Active   pregabalin (LYRICA) 25 MG capsule [id number]  Take 25 mg by mouth. Patient taking 1 in the AM, 1 at Lunch, and 3 at night. [provider]  Active   promethazine -dextromethorphan (PROMETHAZINE -DM) 6.25-15 MG/5ML syrup [id number]  Take 5 mLs by mouth 4 (four) times daily as needed. Kimberly Vernell Norris, PA-C  Active   pyridOXINE (VITAMIN B6) 100 MG tablet [id number]  Take 100 mg by mouth daily. [provider]  Active   vitamin B-12 (CYANOCOBALAMIN ) 1000 MCG tablet [id number]  Take 1,000 mcg by mouth daily. [provider]  Active Self  zinc gluconate 50 MG tablet [id number]  Take 50 mg by mouth daily. [provider]  Active Self           Home Care and Equipment/Supplies: Were Home Health Services Ordered?: No Any new equipment or medical supplies ordered?: No  Functional Questionnaire: Do you need assistance with bathing/showering or dressing?: No Do you need assistance with meal preparation?: No Do you need assistance with eating?: No Do you have difficulty maintaining continence: No Do you need assistance with getting out of bed/getting out of a chair/moving?: No Do you have difficulty managing or taking your medications?: No  Follow up appointments reviewed: PCP Follow-up appointment confirmed?: NA (verified not indicated per outside hospital discharge provider notes-  specialist provider recommended only for hospital follow up - patient declined scheduling with PCP: no need, I just need to see the cardiologist in one month) Specialist Hospital Follow-up appointment confirmed?: Yes Date of Specialist follow-up appointment?: 12/02/23 (verified this is recommended time frame for follow up per hospital discharging provider notes) Follow-Up Specialty Provider:: [facility] Do you need transportation to your follow-up appointment?: No Do you understand care options if your condition(s) worsen?: Yes-patient verbalized understanding  SDOH Interventions Today    Flowsheet Row Most Recent Value  SDOH Interventions   Food Insecurity Interventions Intervention Not Indicated  Housing Interventions Intervention Not Indicated  Transportation Interventions Intervention Not Indicated  [Husband provides transportation]  Utilities Interventions Intervention Not Indicated   See TOC assessment tabs for additional assessment/ TOC intervention information  Patient declines need for ongoing/ further care management/ coordination outreach; declines enrollment in 30-day TOC program- declines taking my direct phone number should needs/ concerns arise post-TOC call   Pls call/ message for questions,  Jubal Rademaker Mckinney Miriah Maruyama, RN, BSN, Media planner  Transitions of Care  [facility] - [facility] [phone]: direct office

## 2023-11-03 ENCOUNTER — Inpatient Hospital Stay
Admit: 2023-11-03 | Discharge: 2023-11-03 | Disposition: A | Discharge status: Home / Self Care | Attending: Hospitalist | Admitting: Hospitalist

## 2023-11-03 ENCOUNTER — Emergency Department

## 2023-11-03 ENCOUNTER — Observation Stay
Admission: EM | Admit: 2023-11-03 | Discharge: 2023-11-04 | Disposition: A | Discharge status: Home / Self Care | Attending: Obstetrics and Gynecology | Admitting: Obstetrics and Gynecology

## 2023-11-03 ENCOUNTER — Other Ambulatory Visit: Payer: Self-pay

## 2023-11-03 DIAGNOSIS — I110 Hypertensive heart disease with heart failure: Secondary | ICD-10-CM | POA: Diagnosis not present

## 2023-11-03 DIAGNOSIS — E782 Mixed hyperlipidemia: Secondary | ICD-10-CM | POA: Insufficient documentation

## 2023-11-03 DIAGNOSIS — I5041 Acute combined systolic (congestive) and diastolic (congestive) heart failure: Secondary | ICD-10-CM | POA: Diagnosis not present

## 2023-11-03 DIAGNOSIS — E8770 Fluid overload, unspecified: Secondary | ICD-10-CM

## 2023-11-03 DIAGNOSIS — I10 Essential (primary) hypertension: Secondary | ICD-10-CM | POA: Diagnosis present

## 2023-11-03 DIAGNOSIS — G4733 Obstructive sleep apnea (adult) (pediatric): Secondary | ICD-10-CM | POA: Insufficient documentation

## 2023-11-03 DIAGNOSIS — Z79899 Other long term (current) drug therapy: Secondary | ICD-10-CM | POA: Diagnosis not present

## 2023-11-03 DIAGNOSIS — Z87891 Personal history of nicotine dependence: Secondary | ICD-10-CM | POA: Insufficient documentation

## 2023-11-03 DIAGNOSIS — K219 Gastro-esophageal reflux disease without esophagitis: Secondary | ICD-10-CM | POA: Diagnosis not present

## 2023-11-03 DIAGNOSIS — I480 Paroxysmal atrial fibrillation: Secondary | ICD-10-CM | POA: Diagnosis not present

## 2023-11-03 DIAGNOSIS — K5000 Crohn's disease of small intestine without complications: Secondary | ICD-10-CM | POA: Diagnosis not present

## 2023-11-03 DIAGNOSIS — I509 Heart failure, unspecified: Principal | ICD-10-CM | POA: Insufficient documentation

## 2023-11-03 DIAGNOSIS — R0602 Shortness of breath: Secondary | ICD-10-CM | POA: Diagnosis present

## 2023-11-03 LAB — HEPATIC FUNCTION PANEL
ALT: 55 U/L — ABNORMAL HIGH (ref 0–44)
AST: 42 U/L — ABNORMAL HIGH (ref 15–41)
Albumin: 3.4 g/dL — ABNORMAL LOW (ref 3.5–5.0)
Alkaline Phosphatase: 30 U/L — ABNORMAL LOW (ref 38–126)
Bilirubin, Direct: 0.1 mg/dL (ref 0.0–0.2)
Indirect Bilirubin: 0.7 mg/dL (ref 0.3–0.9)
Total Bilirubin: 0.8 mg/dL (ref 0.0–1.2)
Total Protein: 5.9 g/dL — ABNORMAL LOW (ref 6.5–8.1)

## 2023-11-03 LAB — CBC
HCT: 34.5 % — ABNORMAL LOW (ref 36.0–46.0)
Hemoglobin: 11.2 g/dL — ABNORMAL LOW (ref 12.0–15.0)
MCH: 28.4 pg (ref 26.0–34.0)
MCHC: 32.5 g/dL (ref 30.0–36.0)
MCV: 87.6 fL (ref 80.0–100.0)
Platelets: 204 K/uL (ref 150–400)
RBC: 3.94 MIL/uL (ref 3.87–5.11)
RDW: 15.2 % (ref 11.5–15.5)
WBC: 7.8 K/uL (ref 4.0–10.5)
nRBC: 0 % (ref 0.0–0.2)

## 2023-11-03 LAB — BASIC METABOLIC PANEL WITH GFR
Anion gap: 8 (ref 5–15)
BUN: 19 mg/dL (ref 8–23)
CO2: 26 mmol/L (ref 22–32)
Calcium: 9.1 mg/dL (ref 8.9–10.3)
Chloride: 104 mmol/L (ref 98–111)
Creatinine, Ser: 0.9 mg/dL (ref 0.44–1.00)
GFR, Estimated: >60 mL/min (ref >60)
Glucose, Bld: 107 mg/dL — ABNORMAL HIGH (ref 70–99)
Potassium: 3.9 mmol/L (ref 3.5–5.1)
Sodium: 138 mmol/L (ref 135–145)

## 2023-11-03 LAB — TROPONIN I (HIGH SENSITIVITY)
Troponin I (High Sensitivity): 43 ng/L — ABNORMAL HIGH (ref <18)
Troponin I (High Sensitivity): 40 ng/L — ABNORMAL HIGH (ref <18)

## 2023-11-03 LAB — BRAIN NATRIURETIC PEPTIDE: B Natriuretic Peptide: 366.8 pg/mL — ABNORMAL HIGH (ref 0.0–100.0)

## 2023-11-03 LAB — MAGNESIUM: Magnesium: 2 mg/dL (ref 1.7–2.4)

## 2023-11-03 MED ORDER — FUROSEMIDE 10 MG/ML IJ SOLN
4.0000 mg/h | INTRAVENOUS | Status: DC
  Filled 2023-11-03: qty 20

## 2023-11-03 MED ORDER — ACETAMINOPHEN 650 MG RE SUPP: 650.0000 mg | Freq: Four times a day (QID) | RECTAL | Status: DC | PRN

## 2023-11-03 MED ORDER — PANTOPRAZOLE SODIUM 40 MG PO TBEC
40.0000 mg | DELAYED_RELEASE_TABLET | Freq: Every morning | ORAL | Status: DC
  Administered 2023-11-03 – 2023-11-04 (×2): 40 mg via ORAL
  Filled 2023-11-03 (×2): qty 1

## 2023-11-03 MED ORDER — ALBUTEROL SULFATE (2.5 MG/3ML) 0.083% IN NEBU: 2.5000 mg | INHALATION_SOLUTION | Freq: Four times a day (QID) | RESPIRATORY_TRACT | Status: DC | PRN

## 2023-11-03 MED ORDER — METOPROLOL SUCCINATE ER 25 MG PO TB24
12.5000 mg | ORAL_TABLET | Freq: Every day | ORAL | Status: DC
  Filled 2023-11-03: qty 1

## 2023-11-03 MED ORDER — METOPROLOL TARTRATE 25 MG PO TABS: 12.5000 mg | ORAL_TABLET | Freq: Two times a day (BID) | ORAL | Status: DC

## 2023-11-03 MED ORDER — PERFLUTREN LIPID MICROSPHERE
1.0000 mL | INTRAVENOUS | Status: AC | PRN
  Administered 2023-11-03: 4 mL via INTRAVENOUS

## 2023-11-03 MED ORDER — SPIRONOLACTONE 12.5 MG HALF TABLET
12.5000 mg | ORAL_TABLET | Freq: Every day | ORAL | Status: DC
  Administered 2023-11-04: 12.5 mg via ORAL
  Filled 2023-11-03 (×2): qty 1

## 2023-11-03 MED ORDER — APIXABAN 5 MG PO TABS
5.0000 mg | ORAL_TABLET | Freq: Two times a day (BID) | ORAL | Status: DC
  Administered 2023-11-03 – 2023-11-04 (×2): 5 mg via ORAL
  Filled 2023-11-03 (×2): qty 1

## 2023-11-03 MED ORDER — FUROSEMIDE 10 MG/ML IJ SOLN: 40.0000 mg | Freq: Two times a day (BID) | INTRAMUSCULAR | Status: DC

## 2023-11-03 MED ORDER — FERROUS SULFATE 325 (65 FE) MG PO TABS
324.0000 mg | ORAL_TABLET | Freq: Every day | ORAL | Status: DC
  Administered 2023-11-04: 324 mg via ORAL
  Filled 2023-11-03: qty 1

## 2023-11-03 MED ORDER — LOSARTAN POTASSIUM 50 MG PO TABS: 50.0000 mg | ORAL_TABLET | Freq: Every day | ORAL | Status: DC

## 2023-11-03 MED ORDER — PRAVASTATIN SODIUM 40 MG PO TABS
40.0000 mg | ORAL_TABLET | Freq: Every day | ORAL | Status: DC
  Administered 2023-11-03: 40 mg via ORAL
  Filled 2023-11-03: qty 1

## 2023-11-03 MED ORDER — POTASSIUM CHLORIDE CRYS ER 20 MEQ PO TBCR
40.0000 meq | EXTENDED_RELEASE_TABLET | Freq: Once | ORAL | Status: AC
  Administered 2023-11-03: 40 meq via ORAL
  Filled 2023-11-03: qty 2

## 2023-11-03 MED ORDER — ACETAMINOPHEN 325 MG PO TABS: 650.0000 mg | ORAL_TABLET | Freq: Four times a day (QID) | ORAL | Status: DC | PRN

## 2023-11-03 MED ORDER — POLYETHYLENE GLYCOL 3350 17 G PO PACK
17.0000 g | PACK | Freq: Every day | ORAL | Status: DC | PRN
Start: 2023-11-03 — End: 2023-11-04

## 2023-11-03 MED ORDER — FUROSEMIDE 10 MG/ML IJ SOLN
20.0000 mg | Freq: Once | INTRAMUSCULAR | Status: AC
  Administered 2023-11-03: 20 mg via INTRAVENOUS
  Filled 2023-11-03: qty 4

## 2023-11-03 MED ORDER — DAPAGLIFLOZIN PROPANEDIOL 10 MG PO TABS
10.0000 mg | ORAL_TABLET | Freq: Every day | ORAL | Status: DC
  Administered 2023-11-03 – 2023-11-04 (×2): 10 mg via ORAL
  Filled 2023-11-03 (×2): qty 1

## 2023-11-03 NOTE — ED Provider Notes (Signed)
 [facility] Provider Note    Event Date/Time   First MD Initiated Contact with Patient 11/03/23 0945     (approximate)   History   Leg Swelling   HPI  Kimberly Andrade is a 78 y.o. female  PMHx of hypertension, hyperlipidemia, stable angina pectoris, obstructive sleep apnea on CPAP, abdominal aortic atherosclerosis, persistent a-fib on eliquis  who recently underwent A-fib ablation and watchman implant at [facility] last week who presents with 5 days of progressively worsening abdominal distention, lower extremity edema and shortness of breath.  Patient denies any chest pain, fevers or chills or cough.  She reports that despite taking her medications as prescribed she has gained 5 pounds in 5 days.  She states that the distention and pain in her bilateral legs make it difficult for her to ambulate    Physical Exam   Triage Vital Signs: ED Triage Vitals  Encounter Vitals Group     BP 11/03/23 0953 (!) 119/48     Girls Systolic BP Percentile --      Girls Diastolic BP Percentile --      Boys Systolic BP Percentile --      Boys Diastolic BP Percentile --      Pulse Rate 11/03/23 0953 (!) 46     Resp 11/03/23 0953 18     Temp 11/03/23 0954 98.3 F (36.8 C)     Temp Source 11/03/23 0953 Oral     SpO2 11/03/23 0953 98 %     Weight 11/03/23 0951 199 lb (90.3 kg)     Height 11/03/23 0951 5' 1 (1.549 m)     Head Circumference --      Peak Flow --      Pain Score 11/03/23 0949 4     Pain Loc --      Pain Education --      Exclude from Growth Chart --     Most recent vital signs: Vitals:   11/03/23 1130 11/03/23 1307  BP: 108/68 (!) 94/51  Pulse: (!) 46 94  Resp: 17   Temp:  98.5 F (36.9 C)  SpO2: 98% 94%    Nursing Triage Note reviewed. Vital signs reviewed and patients oxygen saturation is normoxic  General: Patient is well nourished, well developed, awake and alert, resting comfortably in no acute distress Head: Normocephalic and  atraumatic Eyes: Normal inspection, extraocular muscles intact, no conjunctival pallor Ear, nose, throat: Normal external exam Neck: Normal range of motion Respiratory: Patient is in no respiratory distress, lungs CTAB Cardiovascular: Patient is not tachycardic, RRR without murmur appreciated GI: Abd SNT with no guarding or rebound  Back: Normal inspection of the back with good strength and range of motion throughout all ext Extremities: pulses intact with good cap refills, no LE pitting edema or calf tenderness Neuro: The patient is alert and oriented to person, place, and time, appropriately conversive, with 5/5 bilat UE/LE strength, no gross motor or sensory defects noted. Coordination appears to be adequate. Skin: Warm, dry, and intact Psych: normal mood and affect, no SI or HI  ED Results / Procedures / Treatments   Labs (all labs ordered are listed, but only abnormal results are displayed) Labs Reviewed  BASIC METABOLIC PANEL WITH GFR - Abnormal; Notable for the following components:      Result Value   Glucose, Bld 107 (*)    All other components within normal limits  CBC - Abnormal; Notable for the following components:   Hemoglobin 11.2 (*)  HCT 34.5 (*)    All other components within normal limits  BRAIN NATRIURETIC PEPTIDE - Abnormal; Notable for the following components:   B Natriuretic Peptide 366.8 (*)    All other components within normal limits  HEPATIC FUNCTION PANEL - Abnormal; Notable for the following components:   Total Protein 5.9 (*)    Albumin 3.4 (*)    AST 42 (*)    ALT 55 (*)    Alkaline Phosphatase 30 (*)    All other components within normal limits  TROPONIN I (HIGH SENSITIVITY) - Abnormal; Notable for the following components:   Troponin I (High Sensitivity) 43 (*)    All other components within normal limits  TROPONIN I (HIGH SENSITIVITY) - Abnormal; Notable for the following components:   Troponin I (High Sensitivity) 40 (*)    All other  components within normal limits  MAGNESIUM      EKG EKG and rhythm strip are interpreted by myself:   EKG: bradycardic sinus rhythm] at heart rate of 49, normal QRS duration, QTc 440, nonspecific ST segments and T waves no ectopy EKG not consistent with Acute STEMI Rhythm strip: bradycardic sinus rhythm in lead II   RADIOLOGY XR chest: Pulmonary edema with right pleural effusion on my independent review interpretation radiologist agrees Ultrasound venous: No DVT    PROCEDURES:  Critical Care performed: No  Procedures   MEDICATIONS ORDERED IN ED: Medications  furosemide  (LASIX ) injection 40 mg (has no administration in time range)  acetaminophen  (TYLENOL ) tablet 650 mg (has no administration in time range)    Or  acetaminophen  (TYLENOL ) suppository 650 mg (has no administration in time range)  polyethylene glycol (MIRALAX  / GLYCOLAX ) packet 17 g (has no administration in time range)  apixaban  (ELIQUIS ) tablet 5 mg (has no administration in time range)  albuterol  (PROVENTIL ) (2.5 MG/3ML) 0.083% nebulizer solution 2.5 mg (has no administration in time range)  ferrous sulfate  tablet 324 mg (has no administration in time range)  losartan  (COZAAR ) tablet 50 mg (50 mg Oral Not Given 11/03/23 1353)  metoprolol  tartrate (LOPRESSOR ) tablet 12.5 mg (has no administration in time range)  pantoprazole  (PROTONIX ) EC tablet 40 mg (40 mg Oral Given 11/03/23 1520)  pravastatin  (PRAVACHOL ) tablet 40 mg (has no administration in time range)  potassium chloride  SA (KLOR-CON  M) CR tablet 40 mEq (40 mEq Oral Given 11/03/23 1131)  furosemide  (LASIX ) injection 20 mg (20 mg Intravenous Given 11/03/23 1132)     IMPRESSION / MDM / ASSESSMENT AND PLAN / ED COURSE                                Differential diagnosis includes, but is not limited to CHF exacerbation, arrhythmia, DVT, electrolyte derangement anemia  ED course: Patient does appear hypervolemic on presentation but reassured that she is  satting well on room air.  Chest x-ray does have pulmonary edema.  BNP is elevated but she is in sinus rhythm.  Doppler ultrasound was negative for DVT.  I do think patient does need diuresis.  Will make sure her potassium is optimized and will start a dose of Lasix .  Will call the patient in for admission   Clinical Course as of 11/03/23 1553  Mon Nov 03, 2023  1105 Brain natriuretic peptide(!) Slightly elevated [HD]  1105 Troponin I (High Sensitivity)(!) Slightly [HD]  1105 Troponin I (High Sensitivity)(!): 43 [HD]  1138 Talk to hospitalist for admission [HD]    Clinical  Course User Index [HD] Nicholaus Rolland BRAVO, MD     FINAL CLINICAL IMPRESSION(S) / ED DIAGNOSES   Final diagnoses:  Acute on chronic congestive heart failure, unspecified heart failure type (HCC)  Hypervolemia, unspecified hypervolemia type     Rx / DC Orders   ED Discharge Orders     None        Note:  This document was prepared using Dragon voice recognition software and may include unintentional dictation errors.   Nicholaus Rolland BRAVO, MD 11/03/23 [phone]

## 2023-11-03 NOTE — H&P (Signed)
 History and Physical    Kimberly Andrade:[id number] DOB: [birth year] DOA: 11/03/2023  DOS: the patient was seen and examined on 11/03/2023  PCP: [name], MD   Patient coming from: Home  I have personally briefly reviewed patient's old medical records in [facility]  Chief Complaint: Shortness of breath and leg swelling  HPI: Kimberly Andrade is a pleasant 78 y.o. female with medical history significant for atrial fibrillation s/p ablation for  atrial fibrillation 5 days ago at [facility] along with Watchman procedure, HTN, HLD, GERD OSA on CPAP, stable angina, who woke up this morning with bilateral leg swelling pain and shortness of breath.  Patient stated that she was at [facility] for atrial fibrillation 5 days ago.  She was fine until last night.  States she woke up this morning with those problems and decided to call EMS who brought her here at [facility] emergency room.  Patient denies any fever, chills, nausea, vomiting, chest pain, palpitations.  She denies any dysuria, cough.  She stated that she never had history of congestive heart failure and never had taken diuretics.  ED Course: Upon arrival to the ED, patient is found to be swollen, blood pressure 119/48, pulse 46, saturation 98% on room air, troponin 43, EKG shows sinus bradycardia at 49 bpm.  Chest x-ray showed right pleural effusion, bilateral lower extremity ultrasound showed no DVT.  Patient received Lasix  20 mg IV x 1, potassium tablet 40 mill equivalent x 1 and hospitalist service was consulted for evaluation for admission for new onset of acute congestive heart failure.  Review of Systems:  ROS  All other systems negative except as noted in the HPI.  Past Medical History:  Diagnosis Date   Arthritis    B12 deficiency    Bronchiectasis [facility])    pulmonology   Crohn's disease [facility])    Diverticulosis    DJD (degenerative joint disease)    Essential hypertension 08/16/2019   Full dentures    GERD  (gastroesophageal reflux disease)    pt deneis    Heart palpitations    Hypercholesterolemia    Hyperlipidemia    Incontinence of urine    Nocturnal leg cramps    Osteopenia    Osteoporosis    Reflux    Sleep apnea    uses a cpap   Vertigo    last episode 07/2021   Wears glasses     Past Surgical History:  Procedure Laterality Date   ABDOMINAL HYSTERECTOMY     APPENDECTOMY     BIOPSY  03/26/2023   Procedure: BIOPSY;  Surgeon: Aundria, Ladell [name], MD;  Location: [facility] ENDOSCOPY;  Service: Gastroenterology;;   REDELL MEDIATE     CATARACT EXTRACTION W/PHACO Right 05/28/2022   Procedure: CATARACT EXTRACTION PHACO AND INTRAOCULAR LENS PLACEMENT (IOC) RIGHT  5.21  00:37.2;  Surgeon: Jaye Fallow, MD;  Location: [facility];  Service: Ophthalmology;  Laterality: Right;  sleep apnea   CATARACT EXTRACTION W/PHACO Left 06/18/2022   Procedure: CATARACT EXTRACTION PHACO AND INTRAOCULAR LENS PLACEMENT (IOC) LEFT 5.64 00:39.4;  Surgeon: Jaye Fallow, MD;  Location: [facility]ERY [facility];  Service: Ophthalmology;  Laterality: Left;  sleep apnea   CHOLECYSTECTOMY     COLONOSCOPY N/A 03/26/2023   Procedure: COLONOSCOPY;  Surgeon: Toledo, Ladell [name], MD;  Location: [facility] ENDOSCOPY;  Service: Gastroenterology;  Laterality: N/A;   COLONOSCOPY WITH PROPOFOL  N/A 03/11/2016   Procedure: COLONOSCOPY WITH PROPOFOL ;  Surgeon: Gladis RAYMOND Mariner, MD;  Location: [facility] ENDOSCOPY;  Service:  Endoscopy;  Laterality: N/A;   DILATION AND CURETTAGE OF UTERUS     ESOPHAGOGASTRODUODENOSCOPY N/A 03/11/2016   Procedure: ESOPHAGOGASTRODUODENOSCOPY (EGD);  Surgeon: Gladis RAYMOND Mariner, MD;  Location: [facility] ENDOSCOPY;  Service: Endoscopy;  Laterality: N/A;   ESOPHAGOGASTRODUODENOSCOPY (EGD) WITH PROPOFOL  N/A 11/11/2017   Procedure: ESOPHAGOGASTRODUODENOSCOPY (EGD) WITH PROPOFOL ;  Surgeon: Mariner Gladis RAYMOND, MD;  Location: [facility] ENDOSCOPY;  Service: Endoscopy;  Laterality: N/A;   ESOPHAGOGASTRODUODENOSCOPY (EGD) WITH  PROPOFOL  N/A 01/20/2018   Procedure: ESOPHAGOGASTRODUODENOSCOPY (EGD) WITH PROPOFOL ;  Surgeon: Mariner Gladis RAYMOND, MD;  Location: [facility] ENDOSCOPY;  Service: Endoscopy;  Laterality: N/A;   FOOT SURGERY Right    HAMMER TOE SURGERY     JOINT REPLACEMENT Right 2006   shoulder   KNEE ARTHROSCOPY Right 05/25/2013   Procedure: ARTHROSCOPY KNEE;  Surgeon: Maude KANDICE Herald, MD;  Location: [facility];  Service: Orthopedics;  Laterality: Right;  medial and lateral meniscal debridment and chondroplasty   RADIOFREQUENCY ABLATION NERVES Bilateral 07/2022   lumbar spine   righti shoulder replacement      ROBOTIC ASSISTED LAPAROSCOPIC REPAIR OF PARAESOPHAGEAL HERNIA  07/16/2018   SHOULDER ARTHROSCOPY WITH ROTATOR CUFF REPAIR AND SUBACROMIAL DECOMPRESSION Left 08/14/2017   Procedure: SHOULDER ARTHROSCOPY WITH ROTATOR CUFF REPAIR AND SUBACROMIAL DECOMPRESSION;  Surgeon: Dozier Soulier, MD;  Location: [facility] OR;  Service: Orthopedics;  Laterality: Left;   SHOULDER SURGERY Right    X 3   TOTAL HIP ARTHROPLASTY Left 11/25/2017   Procedure: LEFT TOTAL HIP ARTHROPLASTY ANTERIOR APPROACH;  Surgeon: Herald Maude, MD;  Location: [facility] OR;  Service: Orthopedics;  Laterality: Left;   TOTAL KNEE ARTHROPLASTY Right 04/18/2020   Procedure: RIGHT TOTAL KNEE ARTHROPLASTY;  Surgeon: Herald Maude, MD;  Location: [facility] ORS;  Service: Orthopedics;  Laterality: Right;     reports that she quit smoking about 21 years ago. Her smoking use included cigarettes. She has never used smokeless tobacco. She reports that she does not drink alcohol and does not use drugs.  Allergies  Allergen Reactions   Fosamax [Alendronate Sodium] Other (See Comments)    Bones hurt   Lipitor [Atorvastatin] Other (See Comments)    Bone pain   Codeine Other (See Comments)     Codeine in the liquid form causes gastritis   Hydrocodone  Nausea And Vomiting    Family History  Problem Relation Age of Onset   Heart disease Mother     Esophageal cancer Father     Prior to Admission medications   Medication Sig Start Date End Date Taking? Authorizing Provider  albuterol  (VENTOLIN  HFA) 108 (90 Base) MCG/ACT inhaler  11/08/20  Yes [provider]  apixaban  (ELIQUIS ) 5 MG TABS tablet Take 1 tablet (5 mg total) by mouth 2 (two) times daily. 11/25/22 11/03/23 Yes [name], MD  Ascorbic Acid (VITAMIN C PO) Take 500 mg by mouth daily.   Yes [provider]  benzonatate  (TESSALON ) 200 MG capsule Take 1 capsule (200 mg total) by mouth 3 (three) times daily as needed for cough. 07/17/23  Yes Leath-Warren, Etta PARAS, NP  Calcium  Carbonate-Vit D-Min (CALCIUM  600+D3 PLUS MINERALS) 600-800 MG-UNIT TABS Take 1 tablet by mouth daily.   Yes [provider]  cetirizine  (ZYRTEC ) 5 MG tablet Take 1 tablet (5 mg total) by mouth daily. 05/11/23  Yes [name], PA-C  cholecalciferol  (VITAMIN D ) 25 MCG (1000 UNIT) tablet Take 1,000 Units by mouth daily.   Yes [provider]  ferrous sulfate  324 MG TBEC Take 324 mg by mouth daily with  breakfast.   Yes [provider]  fluticasone  (FLONASE ) 50 MCG/ACT nasal spray Place 1 spray into both nostrils 2 (two) times daily. 05/11/23  Yes [name], PA-C  losartan  (COZAAR ) 50 MG tablet Take 1 tablet (50 mg total) by mouth daily. 09/03/23  Yes [name], MD  Magnesium  250 MG TABS Take 500 mg by mouth daily.   Yes [provider]  mesalamine  (PENTASA ) 500 MG CR capsule Take 2,000 mg by mouth 2 (two) times daily as needed (Crohn's).   Yes [provider]  metoprolol  tartrate (LOPRESSOR ) 25 MG tablet Take 12.5 mg by mouth. 05/21/23 05/20/24 Yes [provider]  Multiple Vitamins-Minerals (MULTIVITAMIN WITH MINERALS) tablet Take 1 tablet by mouth daily.   Yes [provider]  Omega-3 Fatty Acids (FISH OIL PO) Take 1,200 mg by mouth at bedtime.   Yes [provider]  oxybutynin  (DITROPAN  XL) 15 MG 24 hr  tablet Take 1 tablet (15 mg total) by mouth at bedtime. 09/03/23  Yes [name], MD  Potassium 99 MG TABS Take 99 mg by mouth daily.    Yes [provider]  pravastatin  (PRAVACHOL ) 40 MG tablet Take 1 tablet (40 mg total) by mouth daily. 09/03/23  Yes [name], MD  pregabalin (LYRICA) 25 MG capsule Take 25 mg by mouth. Patient taking 1 in the AM, 1 at Lunch, and 3 at night.   Yes [provider]  promethazine -dextromethorphan (PROMETHAZINE -DM) 6.25-15 MG/5ML syrup Take 5 mLs by mouth 4 (four) times daily as needed. 05/11/23  Yes [name], PA-C  pyridOXINE (VITAMIN B6) 100 MG tablet Take 100 mg by mouth daily.   Yes [provider]  vitamin B-12 (CYANOCOBALAMIN ) 1000 MCG tablet Take 1,000 mcg by mouth daily.   Yes [provider]  zinc gluconate 50 MG tablet Take 50 mg by mouth daily.   Yes [provider]  NON FORMULARY BiPap nightly    [provider]  pantoprazole  (PROTONIX ) 40 MG tablet Take 40 mg by mouth every morning. Patient not taking: Reported on 11/03/2023 06/04/21   [provider]    Physical Exam: Vitals:   11/03/23 0954 11/03/23 1030 11/03/23 1130 11/03/23 1307  BP:  103/86 108/68 (!) 94/51  Pulse:  (!) 48 (!) 46 94  Resp:  (!) 23 17   Temp: 98.3 F (36.8 C)   98.5 F (36.9 C)  TempSrc: Oral     SpO2:  97% 98% 94%  Weight:      Height:        Physical Exam   Constitutional: Alert, awake, calm, comfortable HEENT: Neck supple Respiratory: Clear to auscultation B/L, no wheezing, no rales.  Cardiovascular: Regular rate and rhythm, no murmurs / rubs / gallops. No extremity edema. 2+ pedal pulses. No carotid bruits.  Bilateral 2+ pitting edema lower extremity. Abdomen: Soft, no tenderness, Bowel sounds positive.  Musculoskeletal: no clubbing / cyanosis. Good ROM, no contractures. Normal muscle tone.  Skin: no rashes, lesions, ulcers. Neurologic: CN 2-12 grossly intact. Sensation intact, No  focal deficit identified Psychiatric: Alert and oriented x 3. Normal mood.    Labs on Admission: I have personally reviewed following labs and imaging studies  CBC: Recent Labs  Lab 11/03/23 0952  WBC 7.8  HGB 11.2*  HCT 34.5*  MCV 87.6  PLT 204   Basic Metabolic Panel: Recent Labs  Lab 11/03/23 0950 11/03/23 0952  NA  --  138  K  --  3.9  CL  --  104  CO2  --  26  GLUCOSE  --  107*  BUN  --  19  CREATININE  --  0.90  CALCIUM   --  9.1  MG 2.0  --    GFR: Estimated Creatinine Clearance: 52.7 mL/min (by C-G formula based on SCr of 0.9 mg/dL). Liver Function Tests: Recent Labs  Lab 11/03/23 0952  AST 42*  ALT 55*  ALKPHOS 30*  BILITOT 0.8  PROT 5.9*  ALBUMIN 3.4*   No results for input(s): LIPASE, AMYLASE in the last 168 hours. No results for input(s): AMMONIA in the last 168 hours. Coagulation Profile: No results for input(s): INR, PROTIME in the last 168 hours. Cardiac Enzymes: Recent Labs  Lab 11/03/23 0952 11/03/23 1134  TROPONINIHS 43* 40*   BNP (last 3 results) Recent Labs    11/24/22 2331 07/01/23 1425 11/03/23 0950  BNP 174.4* 367.7* 366.8*   HbA1C: No results for input(s): HGBA1C in the last 72 hours. CBG: No results for input(s): GLUCAP in the last 168 hours. Lipid Profile: No results for input(s): CHOL, HDL, LDLCALC, TRIG, CHOLHDL, LDLDIRECT in the last 72 hours. Thyroid Function Tests: No results for input(s): TSH, T4TOTAL, FREET4, T3FREE, THYROIDAB in the last 72 hours. Anemia Panel: No results for input(s): VITAMINB12, FOLATE, FERRITIN, TIBC, IRON, RETICCTPCT in the last 72 hours. Urine analysis:    Component Value Date/Time   COLORURINE YELLOW 04/13/2020 0831   APPEARANCEUR Clear 02/17/2023 1035   LABSPEC 1.029 04/13/2020 0831   PHURINE 5.0 04/13/2020 0831   GLUCOSEU Negative 02/17/2023 1035   HGBUR SMALL (A) 04/13/2020 0831   BILIRUBINUR Negative 02/17/2023 1035   KETONESUR  NEGATIVE 04/13/2020 0831   PROTEINUR Negative 02/17/2023 1035   PROTEINUR NEGATIVE 04/13/2020 0831   UROBILINOGEN 0.2 02/13/2022 0938   NITRITE Negative 02/17/2023 1035   NITRITE NEGATIVE 04/13/2020 0831   LEUKOCYTESUR Trace (A) 02/17/2023 1035   LEUKOCYTESUR NEGATIVE 04/13/2020 0831    Radiological Exams on Admission: I have personally reviewed images US  Venous Img Lower Bilateral Result Date: 11/03/2023 EXAM: ULTRASOUND DUPLEX OF THE BILATERAL LOWER EXTREMITY VEINS TECHNIQUE: Duplex ultrasound using B-mode/gray scaled imaging and Doppler spectral analysis and color flow was obtained of the deep venous structures of the bilateral lower extremity. COMPARISON: None. CLINICAL HISTORY: Bilateral leg swelling, afib. FINDINGS: The visualized veins of the lower extremity are patent and free of echogenic thrombus. The veins demonstrate good compressibility with normal color flow study and spectral analysis. IMPRESSION: 1. No evidence of DVT in either lower extremity. Electronically signed by: Selinda Blue MD 11/03/2023 11:21 AM EDT RP Workstation: [id number]   DG Chest 1 View Result Date: 11/03/2023 EXAM: 1 VIEW XRAY OF THE CHEST 11/03/2023 10:11:00 AM COMPARISON: 05/11/2023 CLINICAL HISTORY: New leg swelling recent ablation. Pt to ED via [facility] form home for bilateral leg swelling and pain since this AM. Had cardiac ablation for a fib 5 days ago at [facility]. Sees cardiology at [facility]. Woke up this AM with bilateral leg pain and swelling. FINDINGS: LUNGS AND PLEURA: Low lung volumes. Mild pulmonary edema. Right basilar atelectasis. Possible small right pleural effusion. HEART AND MEDIASTINUM: Mild cardiomegaly. Left atrial appendage occlusion device in place. BONES AND SOFT TISSUES: Right shoulder arthroplasty noted. No acute osseous abnormality. IMPRESSION: 1. Mild pulmonary edema and possible small right pleural effusion. 2. Mild cardiomegaly with left atrial appendage occlusion device in place. Electronically  signed by: Lonni Necessary MD 11/03/2023 10:24 AM EDT RP Workstation: [id number]    EKG: My  personal interpretation of EKG shows: Sinus bradycardia at 49 bpm, normal QRS complex no STEMI    Assessment/Plan Principal Problem:   Acute heart failure (HCC) Active Problems:   Mixed hyperlipidemia   GERD (gastroesophageal reflux disease)   Crohn's disease of small intestine without complication (HCC)   Essential hypertension   Paroxysmal A-fib (HCC)    Assessment and Plan: 78 year old female double/PMH of atrial fibrillation s/p ablation of atrial fibrillation, s/p Watchman left atrial appendage closure device 5 days ago, on Eliquis  until seen by cardiologist, anemia on iron supplementation, HTN, HLD, chron's disease who came in for bilateral leg swelling and shortness of breath.  1.  Bilateral leg swelling/shortness of breath new onset - May be related to new onset of congestive heart failure in the setting of atrial fibrillation - She was given 20 mg of Lasix  in the emergency room. - I have placed her on 40 mg Lasix  twice a day for 2 days. - Continue to monitor her symptoms. - Echocardiogram, daily weight, input output charting - Cardiology evaluation.  2.  Atrial fibrillation s/p ablation s/p Watchman procedure - Continue metoprolol  - Continue Eliquis  until seen by cardiology, patient is stated it should be on till 45 days  3.  HTN/HLD/GERD - Resume home medications  4.  Crohn's disease - Continue home medication mesalamine     DVT prophylaxis: Eliquis  Code Status: Full Code Family Communication: Husband was at bedside Disposition Plan: Home Consults called: Cardiology Admission status: Inpatient, Telemetry bed   Nena Rebel, MD [facility] 11/03/2023, 1:43 PM

## 2023-11-03 NOTE — Plan of Care (Signed)
  Problem: Education: Goal: Ability to demonstrate management of disease process will improve Outcome: Progressing Goal: Ability to verbalize understanding of medication therapies will improve Outcome: Progressing Goal: Individualized Educational Video(s) Outcome: Progressing   Problem: Activity: Goal: Capacity to carry out activities will improve Outcome: Progressing   Problem: Education: Goal: Knowledge of General Education information will improve Description: Including pain rating scale, medication(s)/side effects and non-pharmacologic comfort measures Outcome: Progressing   Problem: Clinical Measurements: Goal: Ability to maintain clinical measurements within normal limits will improve Outcome: Progressing Goal: Will remain free from infection Outcome: Progressing Goal: Diagnostic test results will improve Outcome: Progressing Goal: Respiratory complications will improve Outcome: Progressing Goal: Cardiovascular complication will be avoided Outcome: Progressing

## 2023-11-03 NOTE — ED Triage Notes (Signed)
 Pt to ED via [facility] form home for bilateral leg swelling and pain since this AM  Had cardiac ablation for a fib 5 days ago at [facility]. Sees cardiology at [facility]  Woke up this AM with bilateral leg pain and swelling  EMS VS:  165/90 then 105/74, 99% RA, HR 64  Pt is ambulatory. Both LE swollen but no pitting  Pt has gained 5# in 5 days. Weighed self this AM.

## 2023-11-03 NOTE — Consult Note (Addendum)
 Kimberly Andrade is a 78 y.o. female  [id number]  Primary Cardiologist: [facility] cardiology Reason for Consultation: CHF  HPI: This is a 78 year old white female with a past medical history of hypertension hyperlipidemia sleep apnea on CPAP with presented to the hospital with leg swelling and shortness of breath.  She denied any chest pain cough or chills.  She states that she has gained 5 pounds in the past 5 days.   Review of Systems: No chest pain   Past Medical History:  Diagnosis Date   Arthritis    B12 deficiency    Bronchiectasis [facility])    pulmonology   Crohn's disease (HCC)    Diverticulosis    DJD (degenerative joint disease)    Essential hypertension 08/16/2019   Full dentures    GERD (gastroesophageal reflux disease)    pt deneis    Heart palpitations    Hypercholesterolemia    Hyperlipidemia    Incontinence of urine    Nocturnal leg cramps    Osteopenia    Osteoporosis    Reflux    Sleep apnea    uses a cpap   Vertigo    last episode 07/2021   Wears glasses     Medications Prior to Admission  Medication Sig Dispense Refill   albuterol  (VENTOLIN  HFA) 108 (90 Base) MCG/ACT inhaler      apixaban  (ELIQUIS ) 5 MG TABS tablet Take 1 tablet (5 mg total) by mouth 2 (two) times daily. 60 tablet 3   Ascorbic Acid (VITAMIN C PO) Take 500 mg by mouth daily.     benzonatate  (TESSALON ) 200 MG capsule Take 1 capsule (200 mg total) by mouth 3 (three) times daily as needed for cough. 30 capsule 0   Calcium  Carbonate-Vit D-Min (CALCIUM  600+D3 PLUS MINERALS) 600-800 MG-UNIT TABS Take 1 tablet by mouth daily.     cetirizine  (ZYRTEC ) 5 MG tablet Take 1 tablet (5 mg total) by mouth daily. 30 tablet 2   cholecalciferol  (VITAMIN D ) 25 MCG (1000 UNIT) tablet Take 1,000 Units by mouth daily.     ferrous sulfate  324 MG TBEC Take 324 mg by mouth daily with breakfast.     fluticasone  (FLONASE ) 50 MCG/ACT nasal spray Place 1 spray into both nostrils 2 (two) times daily. 16 g 2    losartan  (COZAAR ) 50 MG tablet Take 1 tablet (50 mg total) by mouth daily. 90 tablet 1   Magnesium  250 MG TABS Take 500 mg by mouth daily.     mesalamine  (PENTASA ) 500 MG CR capsule Take 2,000 mg by mouth 2 (two) times daily as needed (Crohn's).     metoprolol  tartrate (LOPRESSOR ) 25 MG tablet Take 12.5 mg by mouth.     Multiple Vitamins-Minerals (MULTIVITAMIN WITH MINERALS) tablet Take 1 tablet by mouth daily.     Omega-3 Fatty Acids (FISH OIL PO) Take 1,200 mg by mouth at bedtime.     oxybutynin  (DITROPAN  XL) 15 MG 24 hr tablet Take 1 tablet (15 mg total) by mouth at bedtime. 90 tablet 1   Potassium 99 MG TABS Take 99 mg by mouth daily.      pravastatin  (PRAVACHOL ) 40 MG tablet Take 1 tablet (40 mg total) by mouth daily. 90 tablet 1   pregabalin (LYRICA) 25 MG capsule Take 25 mg by mouth. Patient taking 1 in the AM, 1 at Lunch, and 3 at night.     promethazine -dextromethorphan (PROMETHAZINE -DM) 6.25-15 MG/5ML syrup Take 5 mLs by mouth 4 (four) times daily as needed. 100  mL 0   pyridOXINE (VITAMIN B6) 100 MG tablet Take 100 mg by mouth daily.     vitamin B-12 (CYANOCOBALAMIN ) 1000 MCG tablet Take 1,000 mcg by mouth daily.     zinc gluconate 50 MG tablet Take 50 mg by mouth daily.     NON FORMULARY BiPap nightly     pantoprazole  (PROTONIX ) 40 MG tablet Take 40 mg by mouth every morning. (Patient not taking: Reported on 11/03/2023)        apixaban   5 mg Oral BID   [START ON 11/04/2023] ferrous sulfate   324 mg Oral Q breakfast   furosemide   40 mg Intravenous BID   losartan   50 mg Oral Daily   metoprolol  tartrate  12.5 mg Oral BID   pantoprazole   40 mg Oral q morning   pravastatin   40 mg Oral Daily    Infusions:   Allergies  Allergen Reactions   Fosamax [Alendronate Sodium] Other (See Comments)    Bones hurt   Lipitor [Atorvastatin] Other (See Comments)    Bone pain   Codeine Other (See Comments)     Codeine in the liquid form causes gastritis   Hydrocodone  Nausea And Vomiting     Social History   Socioeconomic History   Marital status: Married    Spouse name: Not on file   Number of children: 3   Years of education: Not on file   Highest education level: Associate degree: academic program  Occupational History   Occupation: retired  Tobacco Use   Smoking status: Former    Current packs/day: 0.00    Types: Cigarettes    Quit date: 12/29/2001    Years since quitting: 21.8   Smokeless tobacco: Never  Vaping Use   Vaping status: Never Used  Substance and Sexual Activity   Alcohol use: No   Drug use: No   Sexual activity: Not on file  Other Topics Concern   Not on file  Social History Narrative   Not on file   Social Drivers of Health   Financial Resource Strain: Low Risk  (05/15/2023)   Received from [facility]   Overall Financial Resource Strain (CARDIA)    Difficulty of Paying Living Expenses: Not hard at all  Food Insecurity: No Food Insecurity (10/31/2023)   Hunger Vital Sign    Worried About Running Out of Food in the Last Year: Never true    Ran Out of Food in the Last Year: Never true  Transportation Needs: No Transportation Needs (10/31/2023)   PRAPARE - Administrator, Civil Service (Medical): No    Lack of Transportation (Non-Medical): No  Physical Activity: Insufficiently Active (04/16/2023)   Exercise Vital Sign    Days of Exercise per Week: 3 days    Minutes of Exercise per Session: 30 min  Stress: No Stress Concern Present (04/16/2023)   [employer] of Occupational Health - Occupational Stress Questionnaire    Feeling of Stress : Not at all  Social Connections: Moderately Integrated (04/16/2023)   Social Connection and Isolation Panel    Frequency of Communication with Friends and Family: More than three times a week    Frequency of Social Gatherings with Friends and Family: Twice a week    Attends Religious Services: More than 4 times per year    Active Member of [employer] or Organizations: No     Attends Banker Meetings: Never    Marital Status: Married  Catering manager Violence: Not At Risk (10/31/2023)  Humiliation, Afraid, Rape, and Kick questionnaire    Fear of Current or Ex-Partner: No    Emotionally Abused: No    Physically Abused: No    Sexually Abused: No    Family History  Problem Relation Age of Onset   Heart disease Mother    Esophageal cancer Father     PHYSICAL EXAM: Vitals:   11/03/23 1130 11/03/23 1307  BP: 108/68 (!) 94/51  Pulse: (!) 46 94  Resp: 17   Temp:  98.5 F (36.9 C)  SpO2: 98% 94%     Intake/Output Summary (Last 24 hours) at 11/03/2023 1334 Last data filed at 11/03/2023 1218 Gross per 24 hour  Intake --  Output 700 ml  Net -700 ml    General:  Well appearing. No respiratory difficulty HEENT: normal Neck: supple. no JVD. Carotids 2+ bilat; no bruits. No lymphadenopathy or thryomegaly appreciated. Cor: PMI nondisplaced. Regular rate & rhythm. No rubs, gallops or murmurs. Lungs: clear Abdomen: soft, nontender, nondistended. No hepatosplenomegaly. No bruits or masses. Good bowel sounds. Extremities: no cyanosis, clubbing, rash, edema Neuro: alert & oriented x 3, cranial nerves grossly intact. moves all 4 extremities w/o difficulty. Affect pleasant.  ECG: Sinus bradycardia 49 bpm with nonspecific ST-T changes  Results for orders placed or performed during the hospital encounter of 11/03/23 (from the past 24 hours)  Brain natriuretic peptide     Status: Abnormal   Collection Time: 11/03/23  9:50 AM  Result Value Ref Range   B Natriuretic Peptide 366.8 (H) 0.0 - 100.0 pg/mL  Magnesium      Status: None   Collection Time: 11/03/23  9:50 AM  Result Value Ref Range   Magnesium  2.0 1.7 - 2.4 mg/dL  Basic metabolic panel     Status: Abnormal   Collection Time: 11/03/23  9:52 AM  Result Value Ref Range   Sodium 138 135 - 145 mmol/L   Potassium 3.9 3.5 - 5.1 mmol/L   Chloride 104 98 - 111 mmol/L   CO2 26 22 - 32 mmol/L    Glucose, Bld 107 (H) 70 - 99 mg/dL   BUN 19 8 - 23 mg/dL   Creatinine, Ser 9.09 0.44 - 1.00 mg/dL   Calcium  9.1 8.9 - 10.3 mg/dL   GFR, Estimated >39 >39 mL/min   Anion gap 8 5 - 15  CBC     Status: Abnormal   Collection Time: 11/03/23  9:52 AM  Result Value Ref Range   WBC 7.8 4.0 - 10.5 K/uL   RBC 3.94 3.87 - 5.11 MIL/uL   Hemoglobin 11.2 (L) 12.0 - 15.0 g/dL   HCT 65.4 (L) 63.9 - 53.9 %   MCV 87.6 80.0 - 100.0 fL   MCH 28.4 26.0 - 34.0 pg   MCHC 32.5 30.0 - 36.0 g/dL   RDW 84.7 88.4 - 84.4 %   Platelets 204 150 - 400 K/uL   nRBC 0.0 0.0 - 0.2 %  Troponin I (High Sensitivity)     Status: Abnormal   Collection Time: 11/03/23  9:52 AM  Result Value Ref Range   Troponin I (High Sensitivity) 43 (H) <18 ng/L  Hepatic function panel     Status: Abnormal   Collection Time: 11/03/23  9:52 AM  Result Value Ref Range   Total Protein 5.9 (L) 6.5 - 8.1 g/dL   Albumin 3.4 (L) 3.5 - 5.0 g/dL   AST 42 (H) 15 - 41 U/L   ALT 55 (H) 0 - 44 U/L   Alkaline  Phosphatase 30 (L) 38 - 126 U/L   Total Bilirubin 0.8 0.0 - 1.2 mg/dL   Bilirubin, Direct 0.1 0.0 - 0.2 mg/dL   Indirect Bilirubin 0.7 0.3 - 0.9 mg/dL  Troponin I (High Sensitivity)     Status: Abnormal   Collection Time: 11/03/23 11:34 AM  Result Value Ref Range   Troponin I (High Sensitivity) 40 (H) <18 ng/L   US  Venous Img Lower Bilateral Result Date: 11/03/2023 EXAM: ULTRASOUND DUPLEX OF THE BILATERAL LOWER EXTREMITY VEINS TECHNIQUE: Duplex ultrasound using B-mode/gray scaled imaging and Doppler spectral analysis and color flow was obtained of the deep venous structures of the bilateral lower extremity. COMPARISON: None. CLINICAL HISTORY: Bilateral leg swelling, afib. FINDINGS: The visualized veins of the lower extremity are patent and free of echogenic thrombus. The veins demonstrate good compressibility with normal color flow study and spectral analysis. IMPRESSION: 1. No evidence of DVT in either lower extremity. Electronically signed  by: Selinda Blue MD 11/03/2023 11:21 AM EDT RP Workstation: [id number]   DG Chest 1 View Result Date: 11/03/2023 EXAM: 1 VIEW XRAY OF THE CHEST 11/03/2023 10:11:00 AM COMPARISON: 05/11/2023 CLINICAL HISTORY: New leg swelling recent ablation. Pt to ED via [facility] form home for bilateral leg swelling and pain since this AM. Had cardiac ablation for a fib 5 days ago at [facility]. Sees cardiology at [facility]. Woke up this AM with bilateral leg pain and swelling. FINDINGS: LUNGS AND PLEURA: Low lung volumes. Mild pulmonary edema. Right basilar atelectasis. Possible small right pleural effusion. HEART AND MEDIASTINUM: Mild cardiomegaly. Left atrial appendage occlusion device in place. BONES AND SOFT TISSUES: Right shoulder arthroplasty noted. No acute osseous abnormality. IMPRESSION: 1. Mild pulmonary edema and possible small right pleural effusion. 2. Mild cardiomegaly with left atrial appendage occlusion device in place. Electronically signed by: Lonni Necessary MD 11/03/2023 10:24 AM EDT RP Workstation: [id number]     ASSESSMENT AND PLAN: #1 congestive heart failure with swelling of the legs and possible ascites.  Change IV Lasix  to Lasix  drip.  Add Aldactone  and Farxiga .  Will make further recommendation after evaluating results of echocardiogram. #2 atrial fibrillation status post ablation and Watchman procedure at [facility] last week, but still is getting Eliquis .  Patient's heart rate is 49, may decrease metoprolol  to 12.5 mg once a day. LVEF on echo was noramal, with RV/RA enlargement and mild pulmonary HTN. May need RHC. Rileigh Kawashima Fernand

## 2023-11-03 NOTE — ED Notes (Signed)
 Pt wants to stay on [facility] for a little while. Husband is with pt. Pt has call bell. [place] socks have been placed on pt feet.

## 2023-11-03 NOTE — Plan of Care (Signed)
  Problem: Education: Goal: Ability to demonstrate management of disease process will improve Outcome: Progressing Goal: Ability to verbalize understanding of medication therapies will improve Outcome: Progressing Goal: Individualized Educational Video(s) Outcome: Progressing   Problem: Activity: Goal: Capacity to carry out activities will improve Outcome: Progressing   Problem: Cardiac: Goal: Ability to achieve and maintain adequate cardiopulmonary perfusion will improve Outcome: Progressing   Problem: Education: Goal: Knowledge of General Education information will improve Description: Including pain rating scale, medication(s)/side effects and non-pharmacologic comfort measures Outcome: Progressing   Problem: Activity: Goal: Risk for activity intolerance will decrease Outcome: Progressing   Problem: Pain Managment: Goal: General experience of comfort will improve and/or be controlled Outcome: Progressing

## 2023-11-03 NOTE — ED Notes (Signed)
 Helped pt up to [facility] to void

## 2023-11-03 NOTE — Progress Notes (Signed)
  Echocardiogram 2D Echocardiogram has been performed. Definity  IV ultrasound imaging agent used on this study.  Kimberly Andrade Louder 11/03/2023, 2:12 PM

## 2023-11-04 DIAGNOSIS — I50811 Acute right heart failure: Secondary | ICD-10-CM

## 2023-11-04 DIAGNOSIS — Z6831 Body mass index (BMI) 31.0-31.9, adult: Secondary | ICD-10-CM | POA: Insufficient documentation

## 2023-11-04 DIAGNOSIS — I509 Heart failure, unspecified: Secondary | ICD-10-CM | POA: Diagnosis not present

## 2023-11-04 LAB — PROTIME-INR
INR: 1.4 — ABNORMAL HIGH (ref 0.8–1.2)
Prothrombin Time: 17.9 s — ABNORMAL HIGH (ref 11.4–15.2)

## 2023-11-04 LAB — ECHOCARDIOGRAM COMPLETE
AR max vel: 2.54 cm2
AV Peak grad: 7.3 mmHg
Ao pk vel: 1.35 m/s
Area-P 1/2: 3.45 cm2
Calc EF: 63 %
Height: 61 in
S' Lateral: 2.6 cm
Single Plane A2C EF: 69.9 %
Single Plane A4C EF: 57.6 %
Weight: 3184 [oz_av]

## 2023-11-04 LAB — CBC
HCT: 30.2 % — ABNORMAL LOW (ref 36.0–46.0)
Hemoglobin: 9.9 g/dL — ABNORMAL LOW (ref 12.0–15.0)
MCH: 28.4 pg (ref 26.0–34.0)
MCHC: 32.8 g/dL (ref 30.0–36.0)
MCV: 86.8 fL (ref 80.0–100.0)
Platelets: 190 K/uL (ref 150–400)
RBC: 3.48 MIL/uL — ABNORMAL LOW (ref 3.87–5.11)
RDW: 15.2 % (ref 11.5–15.5)
WBC: 6.4 K/uL (ref 4.0–10.5)
nRBC: 0 % (ref 0.0–0.2)

## 2023-11-04 LAB — COMPREHENSIVE METABOLIC PANEL WITH GFR
ALT: 45 U/L — ABNORMAL HIGH (ref 0–44)
AST: 27 U/L (ref 15–41)
Albumin: 3.1 g/dL — ABNORMAL LOW (ref 3.5–5.0)
Alkaline Phosphatase: 25 U/L — ABNORMAL LOW (ref 38–126)
Anion gap: 8 (ref 5–15)
BUN: 18 mg/dL (ref 8–23)
CO2: 27 mmol/L (ref 22–32)
Calcium: 9 mg/dL (ref 8.9–10.3)
Chloride: 106 mmol/L (ref 98–111)
Creatinine, Ser: 1.03 mg/dL — ABNORMAL HIGH (ref 0.44–1.00)
GFR, Estimated: 56 mL/min — ABNORMAL LOW (ref >60)
Glucose, Bld: 110 mg/dL — ABNORMAL HIGH (ref 70–99)
Potassium: 4.1 mmol/L (ref 3.5–5.1)
Sodium: 141 mmol/L (ref 135–145)
Total Bilirubin: 1.5 mg/dL — ABNORMAL HIGH (ref 0.0–1.2)
Total Protein: 5.7 g/dL — ABNORMAL LOW (ref 6.5–8.1)

## 2023-11-04 MED ORDER — FUROSEMIDE 20 MG PO TABS: 20.0000 mg | ORAL_TABLET | Freq: Every day | ORAL | Status: DC

## 2023-11-04 MED ORDER — FUROSEMIDE 20 MG PO TABS: 20.0000 mg | ORAL_TABLET | Freq: Every day | ORAL | 1 refills | Status: AC

## 2023-11-04 MED ORDER — ORAL CARE MOUTH RINSE: 15.0000 mL | OROMUCOSAL | Status: DC | PRN

## 2023-11-04 MED ORDER — LOSARTAN POTASSIUM 25 MG PO TABS: 25.0000 mg | ORAL_TABLET | Freq: Every day | ORAL | Status: DC

## 2023-11-04 MED ORDER — SPIRONOLACTONE 25 MG PO TABS: 12.5000 mg | ORAL_TABLET | Freq: Every day | ORAL | 1 refills | Status: AC

## 2023-11-04 NOTE — Progress Notes (Signed)
 Nsg Discharge Note  Admit Date:  11/03/2023 Discharge date: 11/04/2023   BRITINI GARCILAZO to be D/C'd Home per MD order.  AVS completed.  Copy for chart, and copy for patient signed, and dated. Patient/caregiver able to verbalize understanding.  IV catheter discontinued intact. Site without signs and symptoms of complications - no redness or edema noted at insertion site, patient denies c/o pain - only slight tenderness at site.  Dressing with slight pressure applied.  D/c Instructions-Education: Discharge instructions given to patient/family with verbalized understanding. D/c education completed with patient/family including follow up instructions, medication list, d/c activities limitations if indicated, with other d/c instructions as indicated by MD - patient able to verbalize understanding, all questions fully answered. Patient instructed to return to ED, call 911, or call MD for any changes in condition.  Patient escorted via WC, and D/C home via private auto.

## 2023-11-04 NOTE — Progress Notes (Addendum)
 [facility] CLINIC CARDIOLOGY PROGRESS NOTE       Patient ID: Kimberly Andrade MRN: [id number] DOB/AGE: 05-04-1945 78 y.o.  Admit date: 11/03/2023 Referring Physician Dr. Roann Primary Physician Kimberly Andrade Primary Cardiologist Dr. Wilburn Reason for Consultation CHF  HPI: Kimberly Andrade is a 78 y.o. female  with a past medical history of persistent atrial fibrillation s/p PVI ablation, Watchman (10/2023) -remain on Eliquis  until TEE follow-up, history of bradycardia, chronic HFpEF, hypertension, hyperlipidemia, OSA on CPAP who presented to the ED on 11/03/2023 for worsening lower extremity swelling and SOB.  She denies any chest pain. Cardiology was consulted for further evaluation.   Interval History: -Patient seen and examined this AM and laying comfortably in hospital bed. Patient states she feels well, states her SOB and LEE is much improved, has lost 5 pounds since admission and denies any chest pain, palpitations or lightheadedness.  -Patients BP borderline and HR low, stable 40s this AM.  Per telemetry with first-degree AVB, rate stable in the 40s. -Yesterday UOP 2.15L, with stable renal function. -Patient remains on room air with stable SpO2.    Review of systems complete and found to be negative unless listed above    Past Medical History:  Diagnosis Date   Arthritis    B12 deficiency    Bronchiectasis [facility])    pulmonology   Crohn's disease (HCC)    Diverticulosis    DJD (degenerative joint disease)    Essential hypertension 08/16/2019   Full dentures    GERD (gastroesophageal reflux disease)    pt deneis    Heart palpitations    Hypercholesterolemia    Hyperlipidemia    Incontinence of urine    Nocturnal leg cramps    Osteopenia    Osteoporosis    Reflux    Sleep apnea    uses a cpap   Vertigo    last episode 07/2021   Wears glasses     Past Surgical History:  Procedure Laterality Date   ABDOMINAL HYSTERECTOMY     APPENDECTOMY      BIOPSY  03/26/2023   Procedure: BIOPSY;  Surgeon: Aundria, Ladell [name], Andrade;  Location: [facility] ENDOSCOPY;  Service: Gastroenterology;;   REDELL MEDIATE     CATARACT EXTRACTION W/PHACO Right 05/28/2022   Procedure: CATARACT EXTRACTION PHACO AND INTRAOCULAR LENS PLACEMENT (IOC) RIGHT  5.21  00:37.2;  Surgeon: Jaye Fallow, Andrade;  Location: [facility] SURGERY CNTR;  Service: Ophthalmology;  Laterality: Right;  sleep apnea   CATARACT EXTRACTION W/PHACO Left 06/18/2022   Procedure: CATARACT EXTRACTION PHACO AND INTRAOCULAR LENS PLACEMENT (IOC) LEFT 5.64 00:39.4;  Surgeon: Jaye Fallow, Andrade;  Location: [facility] SURGERY CNTR;  Service: Ophthalmology;  Laterality: Left;  sleep apnea   CHOLECYSTECTOMY     COLONOSCOPY N/A 03/26/2023   Procedure: COLONOSCOPY;  Surgeon: Toledo, Ladell [name], Andrade;  Location: [facility] ENDOSCOPY;  Service: Gastroenterology;  Laterality: N/A;   COLONOSCOPY WITH PROPOFOL  N/A 03/11/2016   Procedure: COLONOSCOPY WITH PROPOFOL ;  Surgeon: Gladis RAYMOND Mariner, Andrade;  Location: [facility] ENDOSCOPY;  Service: Endoscopy;  Laterality: N/A;   DILATION AND CURETTAGE OF UTERUS     ESOPHAGOGASTRODUODENOSCOPY N/A 03/11/2016   Procedure: ESOPHAGOGASTRODUODENOSCOPY (EGD);  Surgeon: Gladis RAYMOND Mariner, Andrade;  Location: [facility] ENDOSCOPY;  Service: Endoscopy;  Laterality: N/A;   ESOPHAGOGASTRODUODENOSCOPY (EGD) WITH PROPOFOL  N/A 11/11/2017   Procedure: ESOPHAGOGASTRODUODENOSCOPY (EGD) WITH PROPOFOL ;  Surgeon: Mariner Gladis RAYMOND, Andrade;  Location: [facility] ENDOSCOPY;  Service: Endoscopy;  Laterality: N/A;   ESOPHAGOGASTRODUODENOSCOPY (EGD) WITH PROPOFOL  N/A 01/20/2018   Procedure:  ESOPHAGOGASTRODUODENOSCOPY (EGD) WITH PROPOFOL ;  Surgeon: Gaylyn Gladis PENNER, Andrade;  Location: [facility] ENDOSCOPY;  Service: Endoscopy;  Laterality: N/A;   FOOT SURGERY Right    HAMMER TOE SURGERY     JOINT REPLACEMENT Right 2006   shoulder   KNEE ARTHROSCOPY Right 05/25/2013   Procedure: ARTHROSCOPY KNEE;  Surgeon: Maude KANDICE Herald, Andrade;  Location: [facility];  Service: Orthopedics;  Laterality: Right;  medial and lateral meniscal debridment and chondroplasty   RADIOFREQUENCY ABLATION NERVES Bilateral 07/2022   lumbar spine   righti shoulder replacement      ROBOTIC ASSISTED LAPAROSCOPIC REPAIR OF PARAESOPHAGEAL HERNIA  07/16/2018   SHOULDER ARTHROSCOPY WITH ROTATOR CUFF REPAIR AND SUBACROMIAL DECOMPRESSION Left 08/14/2017   Procedure: SHOULDER ARTHROSCOPY WITH ROTATOR CUFF REPAIR AND SUBACROMIAL DECOMPRESSION;  Surgeon: Dozier Soulier, Andrade;  Location: [facility] OR;  Service: Orthopedics;  Laterality: Left;   SHOULDER SURGERY Right    X 3   TOTAL HIP ARTHROPLASTY Left 11/25/2017   Procedure: LEFT TOTAL HIP ARTHROPLASTY ANTERIOR APPROACH;  Surgeon: Herald Maude, Andrade;  Location: [facility] OR;  Service: Orthopedics;  Laterality: Left;   TOTAL KNEE ARTHROPLASTY Right 04/18/2020   Procedure: RIGHT TOTAL KNEE ARTHROPLASTY;  Surgeon: Herald Maude, Andrade;  Location: [facility] ORS;  Service: Orthopedics;  Laterality: Right;    Medications Prior to Admission  Medication Sig Dispense Refill Last Dose/Taking   albuterol  (VENTOLIN  HFA) 108 (90 Base) MCG/ACT inhaler    11/02/2023   apixaban  (ELIQUIS ) 5 MG TABS tablet Take 1 tablet (5 mg total) by mouth 2 (two) times daily. 60 tablet 3 11/03/2023 at  8:30 AM   Ascorbic Acid (VITAMIN C PO) Take 500 mg by mouth daily.   11/02/2023   benzonatate  (TESSALON ) 200 MG capsule Take 1 capsule (200 mg total) by mouth 3 (three) times daily as needed for cough. 30 capsule 0 Unknown   Calcium  Carbonate-Vit D-Min (CALCIUM  600+D3 PLUS MINERALS) 600-800 MG-UNIT TABS Take 1 tablet by mouth daily.   11/02/2023   cetirizine  (ZYRTEC ) 5 MG tablet Take 1 tablet (5 mg total) by mouth daily. 30 tablet 2 Unknown   cholecalciferol  (VITAMIN D ) 25 MCG (1000 UNIT) tablet Take 1,000 Units by mouth daily.   11/02/2023   ferrous sulfate  324 MG TBEC Take 324 mg by mouth daily with breakfast.   11/02/2023   fluticasone  (FLONASE ) 50 MCG/ACT nasal spray  Place 1 spray into both nostrils 2 (two) times daily. 16 g 2 11/02/2023   losartan  (COZAAR ) 50 MG tablet Take 1 tablet (50 mg total) by mouth daily. 90 tablet 1 11/02/2023   Magnesium  250 MG TABS Take 500 mg by mouth daily.   11/02/2023   mesalamine  (PENTASA ) 500 MG CR capsule Take 2,000 mg by mouth 2 (two) times daily as needed (Crohn's).   11/02/2023   metoprolol  tartrate (LOPRESSOR ) 25 MG tablet Take 12.5 mg by mouth.   11/03/2023 Morning   Multiple Vitamins-Minerals (MULTIVITAMIN WITH MINERALS) tablet Take 1 tablet by mouth daily.   11/02/2023   Omega-3 Fatty Acids (FISH OIL PO) Take 1,200 mg by mouth at bedtime.   11/02/2023   oxybutynin  (DITROPAN  XL) 15 MG 24 hr tablet Take 1 tablet (15 mg total) by mouth at bedtime. 90 tablet 1 11/02/2023   Potassium 99 MG TABS Take 99 mg by mouth daily.    11/02/2023   pravastatin  (PRAVACHOL ) 40 MG tablet Take 1 tablet (40 mg total) by mouth daily. 90 tablet 1 11/02/2023   pregabalin (LYRICA) 25 MG capsule Take 25 mg  by mouth. Patient taking 1 in the AM, 1 at Lunch, and 3 at night.   11/03/2023 Morning   promethazine -dextromethorphan (PROMETHAZINE -DM) 6.25-15 MG/5ML syrup Take 5 mLs by mouth 4 (four) times daily as needed. 100 mL 0 Unknown   pyridOXINE (VITAMIN B6) 100 MG tablet Take 100 mg by mouth daily.   11/02/2023   vitamin B-12 (CYANOCOBALAMIN ) 1000 MCG tablet Take 1,000 mcg by mouth daily.   11/02/2023   zinc gluconate 50 MG tablet Take 50 mg by mouth daily.   11/02/2023   NON FORMULARY BiPap nightly      pantoprazole  (PROTONIX ) 40 MG tablet Take 40 mg by mouth every morning. (Patient not taking: Reported on 11/03/2023)   Not Taking   Social History   Socioeconomic History   Marital status: Married    Spouse name: Not on file   Number of children: 3   Years of education: Not on file   Highest education level: Associate degree: academic program  Occupational History   Occupation: retired  Tobacco Use   Smoking status: Former    Current packs/day: 0.00     Types: Cigarettes    Quit date: 12/29/2001    Years since quitting: 21.8   Smokeless tobacco: Never  Vaping Use   Vaping status: Never Used  Substance and Sexual Activity   Alcohol use: No   Drug use: No   Sexual activity: Not on file  Other Topics Concern   Not on file  Social History Narrative   Not on file   Social Drivers of Health   Financial Resource Strain: Low Risk  (05/15/2023)   Received from [facility]   Overall Financial Resource Strain (CARDIA)    Difficulty of Paying Living Expenses: Not hard at all  Food Insecurity: No Food Insecurity (11/03/2023)   Hunger Vital Sign    Worried About Running Out of Food in the Last Year: Never true    Ran Out of Food in the Last Year: Never true  Transportation Needs: No Transportation Needs (11/03/2023)   PRAPARE - Administrator, Civil Service (Medical): No    Lack of Transportation (Non-Medical): No  Physical Activity: Insufficiently Active (04/16/2023)   Exercise Vital Sign    Days of Exercise per Week: 3 days    Minutes of Exercise per Session: 30 min  Stress: No Stress Concern Present (04/16/2023)   [employer] of Occupational Health - Occupational Stress Questionnaire    Feeling of Stress : Not at all  Social Connections: Moderately Integrated (11/03/2023)   Social Connection and Isolation Panel    Frequency of Communication with Friends and Family: More than three times a week    Frequency of Social Gatherings with Friends and Family: Twice a week    Attends Religious Services: More than 4 times per year    Active Member of [employer] or Organizations: No    Attends Banker Meetings: Never    Marital Status: Married  Catering manager Violence: Not At Risk (11/03/2023)   Humiliation, Afraid, Rape, and Kick questionnaire    Fear of Current or Ex-Partner: No    Emotionally Abused: No    Physically Abused: No    Sexually Abused: No    Family History  Problem Relation Age of Onset    Heart disease Mother    Esophageal cancer Father      Vitals:   11/04/23 0141 11/04/23 0405 11/04/23 0527 11/04/23 0733  BP: 101/60 109/66  (!) 104/48  Pulse: (!) 43 (!) 52  (!) 48  Resp: 14 18  20   Temp: 98 F (36.7 C) 98 F (36.7 C)    TempSrc:      SpO2: 95% 94%  94%  Weight:   88.4 kg   Height:        PHYSICAL EXAM General: Well-appearing elderly female, well nourished, in no acute distress. HEENT: Normocephalic and atraumatic. Neck: No JVD.   Lungs: Normal respiratory effort on room air. Clear bilaterally to auscultation. No wheezes, crackles, rhonchi.  Heart: HRR, slow rate. Normal S1 and S2 without gallops or murmurs.  Abdomen: Non-distended appearing.  Msk: Normal strength and tone for age. Extremities: Warm and well perfused. No clubbing, cyanosis. Trace pedal edema.  Neuro: Alert and oriented X 3. Psych: Answers questions appropriately.   Labs: Basic Metabolic Panel: Recent Labs    11/03/23 0950 11/03/23 0952 11/04/23 0451  NA  --  138 141  K  --  3.9 4.1  CL  --  104 106  CO2  --  26 27  GLUCOSE  --  107* 110*  BUN  --  19 18  CREATININE  --  0.90 1.03*  CALCIUM   --  9.1 9.0  MG 2.0  --   --    Liver Function Tests: Recent Labs    11/03/23 0952 11/04/23 0451  AST 42* 27  ALT 55* 45*  ALKPHOS 30* 25*  BILITOT 0.8 1.5*  PROT 5.9* 5.7*  ALBUMIN 3.4* 3.1*   No results for input(s): LIPASE, AMYLASE in the last 72 hours. CBC: Recent Labs    11/03/23 0952 11/04/23 0451  WBC 7.8 6.4  HGB 11.2* 9.9*  HCT 34.5* 30.2*  MCV 87.6 86.8  PLT 204 190   Cardiac Enzymes: Recent Labs    11/03/23 0952 11/03/23 1134  TROPONINIHS 43* 40*   BNP: Recent Labs    11/03/23 0950  BNP 366.8*   D-Dimer: No results for input(s): DDIMER in the last 72 hours. Hemoglobin A1C: No results for input(s): HGBA1C in the last 72 hours. Fasting Lipid Panel: No results for input(s): CHOL, HDL, LDLCALC, TRIG, CHOLHDL, LDLDIRECT in the  last 72 hours. Thyroid Function Tests: No results for input(s): TSH, T4TOTAL, T3FREE, THYROIDAB in the last 72 hours.  Invalid input(s): FREET3 Anemia Panel: No results for input(s): VITAMINB12, FOLATE, FERRITIN, TIBC, IRON, RETICCTPCT in the last 72 hours.   Radiology: ECHOCARDIOGRAM COMPLETE Result Date: 11/04/2023    ECHOCARDIOGRAM REPORT   Patient Name:   Kimberly Andrade [facility] Date of Exam: 11/03/2023 Medical Rec #:  [id number]             Height:       61.0 in Accession #:    [id number]            Weight:       199.0 lb Date of Birth:  April 27, 1945             BSA:          1.885 m Patient Age:    78 years              BP:           94/51 mmHg Patient Gender: F                     HR:           52 bpm. Exam Location:  [facility] Procedure: 2D Echo, Cardiac Doppler, Color Doppler and Intracardiac  Opacification Agent (Both Spectral and Color Flow Doppler were            utilized during procedure). Indications:     CHF I50.31  History:         Patient has no prior history of Echocardiogram examinations.  Sonographer:     Thedora Louder RDCS, FASE Referring Phys:  [id number] NENA PAUDEL Diagnosing Phys: Cara JONETTA Lovelace Andrade  Sonographer Comments: Technically difficult study due to poor echo windows. Image acquisition challenging due to patient body habitus and Image acquisition challenging due to respiratory motion. IMPRESSIONS  1. Left ventricular ejection fraction, by estimation, is 60 to 65%. The left ventricle has normal function. The left ventricle has no regional wall motion abnormalities. There is mild left ventricular hypertrophy. Left ventricular diastolic parameters are consistent with Grade III diastolic dysfunction (restrictive). There is the interventricular septum is flattened in diastole ('D' shaped left ventricle), consistent with right ventricular volume overload.  2. Right ventricular systolic function is moderately reduced. The right ventricular size is moderately  enlarged. Mildly increased right ventricular wall thickness. There is moderately elevated pulmonary artery systolic pressure.  3. Left atrial size was moderately dilated.  4. Right atrial size was moderately dilated.  5. The mitral valve is normal in structure. Mild mitral valve regurgitation. No evidence of mitral stenosis.  6. Tricuspid valve regurgitation is mild to moderate.  7. The aortic valve is normal in structure. Aortic valve regurgitation is trivial. No aortic stenosis is present.  8. The inferior vena cava is normal in size with greater than 50% respiratory variability, suggesting right atrial pressure of 3 mmHg. Conclusion(s)/Recommendation(s): Poor windows for evaluation of left ventricular function by transthoracic echocardiography. Would recommend an alternative means of evaluation. FINDINGS  Left Ventricle: Left ventricular ejection fraction, by estimation, is 60 to 65%. The left ventricle has normal function. The left ventricle has no regional wall motion abnormalities. Definity  contrast agent was given IV to delineate the left ventricular  endocardial borders. Strain was performed and the global longitudinal strain is indeterminate. The left ventricular internal cavity size was normal in size. There is mild left ventricular hypertrophy. The interventricular septum is flattened in diastole  ('D' shaped left ventricle), consistent with right ventricular volume overload. Left ventricular diastolic parameters are consistent with Grade III diastolic dysfunction (restrictive). Right Ventricle: The right ventricular size is moderately enlarged. Mildly increased right ventricular wall thickness. Right ventricular systolic function is moderately reduced. There is moderately elevated pulmonary artery systolic pressure. Left Atrium: Left atrial size was moderately dilated. Right Atrium: Right atrial size was moderately dilated. Pericardium: There is no evidence of pericardial effusion. Mitral Valve: The  mitral valve is normal in structure. Mild mitral valve regurgitation. No evidence of mitral valve stenosis. Tricuspid Valve: The tricuspid valve is normal in structure. Tricuspid valve regurgitation is mild to moderate. No evidence of tricuspid stenosis. Aortic Valve: The aortic valve is normal in structure. Aortic valve regurgitation is trivial. No aortic stenosis is present. Aortic valve peak gradient measures 7.3 mmHg. Pulmonic Valve: The pulmonic valve was normal in structure. Pulmonic valve regurgitation is trivial. No evidence of pulmonic stenosis. Aorta: The aortic root is normal in size and structure. Venous: The inferior vena cava is normal in size with greater than 50% respiratory variability, suggesting right atrial pressure of 3 mmHg. IAS/Shunts: No atrial level shunt detected by color flow Doppler. Additional Comments: 3D was performed not requiring image post processing on an independent workstation and was indeterminate.  LEFT VENTRICLE PLAX 2D  LVIDd:         4.00 cm     Diastology LVIDs:         2.60 cm     LV e' medial:    14.00 cm/s LV PW:         1.10 cm     LV E/e' medial:  9.1 LV IVS:        0.90 cm     LV e' lateral:   10.90 cm/s LVOT diam:     1.80 cm     LV E/e' lateral: 11.7 LV SV:         80 LV SV Index:   42 LVOT Area:     2.54 cm  LV Volumes (MOD) LV vol d, MOD A2C: 70.5 ml LV vol d, MOD A4C: 71.9 ml LV vol s, MOD A2C: 21.2 ml LV vol s, MOD A4C: 30.5 ml LV SV MOD A2C:     49.3 ml LV SV MOD A4C:     71.9 ml LV SV MOD BP:      45.5 ml RIGHT VENTRICLE RV Basal diam:  3.20 cm RV S prime:     18.20 cm/s TAPSE (M-mode): 2.6 cm LEFT ATRIUM             Index        RIGHT ATRIUM           Index LA diam:        5.20 cm 2.76 cm/m   RA Area:     14.60 cm LA Vol (A2C):   56.1 ml 29.77 ml/m  RA Volume:   37.30 ml  19.79 ml/m LA Vol (A4C):   40.0 ml 21.22 ml/m LA Biplane Vol: 47.7 ml 25.31 ml/m  AORTIC VALVE                 PULMONIC VALVE AV Area (Vmax): 2.54 cm     PV Vmax:        1.11 m/s AV  Vmax:        135.00 cm/s  PV Peak grad:   4.9 mmHg AV Peak Grad:   7.3 mmHg     RVOT Peak grad: 4 mmHg LVOT Vmax:      135.00 cm/s LVOT Vmean:     89.600 cm/s LVOT VTI:       0.313 m  AORTA Ao Root diam: 2.90 cm Ao Asc diam:  2.90 cm MITRAL VALVE                TRICUSPID VALVE MV Area (PHT): 3.45 cm     TR Peak grad:   46.8 mmHg MV Decel Time: 220 msec     TR Vmax:        342.00 cm/s MV E velocity: 127.00 cm/s MV A velocity: 41.60 cm/s   SHUNTS MV E/A ratio:  3.05         Systemic VTI:  0.31 m                             Systemic Diam: 1.80 cm Dwayne D Callwood Andrade Electronically signed by Cara JONETTA Lovelace Andrade Signature Date/Time: 11/04/2023/10:33:55 AM    Final    US  Venous Img Lower Bilateral Result Date: 11/03/2023 EXAM: ULTRASOUND DUPLEX OF THE BILATERAL LOWER EXTREMITY VEINS TECHNIQUE: Duplex ultrasound using B-mode/gray scaled imaging and Doppler spectral analysis and color flow was obtained of the deep venous structures of the bilateral lower extremity.  COMPARISON: None. CLINICAL HISTORY: Bilateral leg swelling, afib. FINDINGS: The visualized veins of the lower extremity are patent and free of echogenic thrombus. The veins demonstrate good compressibility with normal color flow study and spectral analysis. IMPRESSION: 1. No evidence of DVT in either lower extremity. Electronically signed by: Selinda Blue Andrade 11/03/2023 11:21 AM EDT RP Workstation: [id number]   DG Chest 1 View Result Date: 11/03/2023 EXAM: 1 VIEW XRAY OF THE CHEST 11/03/2023 10:11:00 AM COMPARISON: 05/11/2023 CLINICAL HISTORY: New leg swelling recent ablation. Pt to ED via [facility] form home for bilateral leg swelling and pain since this AM. Had cardiac ablation for a fib 5 days ago at [facility]. Sees cardiology at [facility]. Woke up this AM with bilateral leg pain and swelling. FINDINGS: LUNGS AND PLEURA: Low lung volumes. Mild pulmonary edema. Right basilar atelectasis. Possible small right pleural effusion. HEART AND MEDIASTINUM: Mild cardiomegaly.  Left atrial appendage occlusion device in place. BONES AND SOFT TISSUES: Right shoulder arthroplasty noted. No acute osseous abnormality. IMPRESSION: 1. Mild pulmonary edema and possible small right pleural effusion. 2. Mild cardiomegaly with left atrial appendage occlusion device in place. Electronically signed by: Lonni Necessary Andrade 11/03/2023 10:24 AM EDT RP Workstation: [id number]   MM 3D SCREENING MAMMOGRAM BILATERAL BREAST Result Date: 10/17/2023 CLINICAL DATA:  Screening. EXAM: DIGITAL SCREENING BILATERAL MAMMOGRAM WITH TOMOSYNTHESIS AND CAD TECHNIQUE: Bilateral screening digital craniocaudal and mediolateral oblique mammograms were obtained. Bilateral screening digital breast tomosynthesis was performed. The images were evaluated with computer-aided detection. COMPARISON:  Previous exam(s). ACR Breast Density Category b: There are scattered areas of fibroglandular density. FINDINGS: There are no findings suspicious for malignancy. IMPRESSION: No mammographic evidence of malignancy. A result letter of this screening mammogram will be mailed directly to the patient. RECOMMENDATION: Screening mammogram in one year. (Code:SM-B-01Y) BI-RADS CATEGORY  1: Negative. Electronically Signed   By: Dina  Arceo [name]   On: 10/17/2023 04:58    ECHO as above.  TELEMETRY reviewed by me 11/04/2023: Sinus bradycardia with first-degree AVB, rate 40s (no significant pauses or high-grade AV block)  EKG reviewed by me: Sinus bradycardia with first-degree AVB, rate 49 bpm  Data reviewed by me 11/04/2023: last 24h vitals tele labs imaging I/O hospitalist progress notes.  Principal Problem:   Acute heart failure (HCC) Active Problems:   Mixed hyperlipidemia   GERD (gastroesophageal reflux disease)   Crohn's disease of small intestine without complication (HCC)   Essential hypertension   Paroxysmal A-fib (HCC)    ASSESSMENT AND PLAN:  BITHA FAUTEUX is a 78 y.o. female  with a past medical history of  persistent atrial fibrillation s/p PVI ablation, Watchman (10/2023) -remain on Eliquis  until TEE follow-up, history of bradycardia, chronic HFpEF, hypertension, hyperlipidemia, OSA on CPAP who presented to the ED on 11/03/2023 for worsening lower extremity swelling and SOB.  She denies any chest pain. Cardiology was consulted for further evaluation.   # Acute on chronic HFpEF Great UOP yesterday 2.15 L with 5 pound weight loss since admission.  Patient appears near euvolemia.  Echo this admission with preserved EF, no RWMA, moderately elevated PASP. -Ordered Lasix  20 mg daily. -Continue dapagliflozin  10 mg daily. -Continue spironolactone  12.5 mg daily. -Recommend outpatient follow-up with pulmonology with echo findings of moderate pulmonary hypertension.  # Persistent atrial fibrillation s/p PVI ablation, Watchman (10/2023) # History of bradycardia -Monitor and replenish electrolytes for a goal K >4, Mag >2  -Continue home Eliquis  5 mg twice daily. Must be continued until follow-up TEE. -Hold home metoprolol  succinate  due to baseline bradycardia. HR stable 40s. Can be resumed at outpatient follow-up with HR improves.   # Hypertension # Hyperlipidemia -Continue pravastatin  40 mg daily. -Hold home losartan  50 mg daily due to borderline BP.  Resume when BP allows.   This patient's plan of care was discussed and created with Dr. Florencio and he is in agreement.  Signed: Dorene Comfort, PA-C  11/04/2023, 11:19 AM [facility]

## 2023-11-04 NOTE — Discharge Instructions (Signed)
 Follow up with PCP and pick up medications from pharmacy if you have any. Do not double up on narcotic/pain medication or drink alcohol during this time. Do not drive while taking narcotic medication. Call 911 or return to ER for life threatening issues or other concerns.

## 2023-11-04 NOTE — Progress Notes (Signed)
 Heart Failure Navigator Progress Note  Assessed for Heart & Vascular TOC clinic readiness.  Patient does not meet criteria due to current [facility] patient.   Navigator will sign off at this time.  Charmaine Pines, RN, BSN [facility] Heart Failure Navigator Secure Chat Only

## 2023-11-04 NOTE — Care Management CC44 (Signed)
 Condition Code 44 Documentation Completed  Patient Details  Name: MAO LOCKNER MRN: [id number] Date of Birth: 1945/05/10   Condition Code 44 given:  Yes Patient signature on Condition Code 44 notice:  Yes Documentation of 2 MD's agreement:  Yes Code 44 added to claim:  Yes    Corean ONEIDA Haddock, RN 11/04/2023, 3:52 PM

## 2023-11-04 NOTE — Progress Notes (Signed)
 Patient is refusing d-dimer ordered by MD. Dr Kandis at bedside to educate patient on risks of leaving without having test run and possibility of a PE. Pt states she doesn't understand why she would have a PE if my lungs are clear. Pt educated by Dr Kandis that a CXR wont diagnose a PE. Pt stated I don't want to talk about it.

## 2023-11-04 NOTE — Discharge Summary (Signed)
 Kimberly Andrade FMW:[id number] DOB: [birth year] DOA: 11/03/2023  PCP: [name], MD  Admit date: 11/03/2023 Discharge date: 11/04/2023  Time spent: 35 minutes  Recommendations for Outpatient Follow-up:  Cardiology f/u next week as scheduled Pulmonology f/u Pcp f/u     Discharge Diagnoses:  Principal Problem:   Acute heart failure (HCC) Active Problems:   Obstructive sleep apnea of adult   Mixed hyperlipidemia   GERD (gastroesophageal reflux disease)   Crohn's disease of small intestine without complication (HCC)   Essential hypertension   Paroxysmal A-fib (HCC)   Discharge Condition: improved  Diet recommendation: heart healthy  Filed Weights   11/03/23 0951 11/04/23 0527  Weight: 90.3 kg 88.4 kg    History of present illness:  From admission h and p Kimberly Andrade is a pleasant 78 y.o. female with medical history significant for atrial fibrillation s/p ablation for  atrial fibrillation 5 days ago at [facility] along with Watchman procedure, HTN, HLD, GERD OSA on CPAP, stable angina, who woke up this morning with bilateral leg swelling pain and shortness of breath.  Patient stated that she was at [facility] for atrial fibrillation 5 days ago.  She was fine until last night.  States she woke up this morning with those problems and decided to call EMS who brought her here at [facility] emergency room.  Patient denies any fever, chills, nausea, vomiting, chest pain, palpitations.  She denies any dysuria, cough.  She stated that she never had history of congestive heart failure and never had taken diuretics.   Hospital Course:  Patient presents with lower extremity swelling. She denies dyspnea to the provider who is discharging her, though this is listed in the hpi. Here cardiology was consulted and the patient was diuresed. Edema is resolved. TTE shows RV dysfunction with pHTN. Patient with recent afib embolization and placement of watchman device. Cardiology advises start lasix  and  spironolactone  and farxiga  (patient declines farxiga  given cost concern). Bradycardic here, they advise holding home metop and also losartan  given soft pressures. PVLs negative for DVT; discharge provider advised w/u for PE (unlikely given patient's report of compliance with anticoagulation, but still possible), but patient declines this, instead insists on discharge. We reviewed the risks of undiagnosed PE and patient is aware she is leaving against medical advice. She has a history of bronchiectasis so certainly possible the pHTN is secondary to this - she prefers to f/u with her pulmonologist dr. Theotis. She has cardiology f/u scheduled for next week.   Procedures: none   Consultations: cardiology  Discharge Exam: Vitals:   11/04/23 0405 11/04/23 0733  BP: 109/66 (!) 104/48  Pulse: (!) 52 (!) 48  Resp: 18 20  Temp: 98 F (36.7 C)   SpO2: 94% 94%    General: NAD Cardiovascular: bradycardic, rr Respiratory: CTAB Ext: warm, no edema  Discharge Instructions   Discharge Instructions     Diet - low sodium heart healthy   Complete by: As directed    Increase activity slowly   Complete by: As directed       Allergies as of 11/04/2023       Reactions   Fosamax [alendronate Sodium] Other (See Comments)   Bones hurt   Lipitor [atorvastatin] Other (See Comments)   Bone pain   Codeine Other (See Comments)    Codeine in the liquid form causes gastritis   Hydrocodone  Nausea And Vomiting        Medication List     PAUSE taking these medications  losartan  50 MG tablet Wait to take this until your doctor or other care provider tells you to start again. Commonly known as: COZAAR  Take 1 tablet (50 mg total) by mouth daily.   metoprolol  tartrate 25 MG tablet Wait to take this until your doctor or other care provider tells you to start again. Commonly known as: LOPRESSOR  Take 12.5 mg by mouth.       TAKE these medications    albuterol  108 (90 Base) MCG/ACT  inhaler Commonly known as: VENTOLIN  HFA   apixaban  5 MG Tabs tablet Commonly known as: ELIQUIS  Take 1 tablet (5 mg total) by mouth 2 (two) times daily.   benzonatate  200 MG capsule Commonly known as: TESSALON  Take 1 capsule (200 mg total) by mouth 3 (three) times daily as needed for cough.   Calcium  600+D3 Plus Minerals 600-800 MG-UNIT Tabs Take 1 tablet by mouth daily.   cetirizine  5 MG tablet Commonly known as: ZYRTEC  Take 1 tablet (5 mg total) by mouth daily.   cholecalciferol  25 MCG (1000 UNIT) tablet Commonly known as: VITAMIN D3 Take 1,000 Units by mouth daily.   cyanocobalamin  1000 MCG tablet Commonly known as: VITAMIN B12 Take 1,000 mcg by mouth daily.   ferrous sulfate  324 MG Tbec Take 324 mg by mouth daily with breakfast.   FISH OIL PO Take 1,200 mg by mouth at bedtime.   fluticasone  50 MCG/ACT nasal spray Commonly known as: FLONASE  Place 1 spray into both nostrils 2 (two) times daily.   furosemide  20 MG tablet Commonly known as: LASIX  Take 1 tablet (20 mg total) by mouth daily. Start taking on: November 05, 2023   Magnesium  250 MG Tabs Take 500 mg by mouth daily.   mesalamine  500 MG CR capsule Commonly known as: PENTASA  Take 2,000 mg by mouth 2 (two) times daily as needed (Crohn's).   multivitamin with minerals tablet Take 1 tablet by mouth daily.   NON FORMULARY BiPap nightly   oxybutynin  15 MG 24 hr tablet Commonly known as: DITROPAN  XL Take 1 tablet (15 mg total) by mouth at bedtime.   pantoprazole  40 MG tablet Commonly known as: PROTONIX  Take 40 mg by mouth every morning.   Potassium 99 MG Tabs Take 99 mg by mouth daily.   pravastatin  40 MG tablet Commonly known as: PRAVACHOL  Take 1 tablet (40 mg total) by mouth daily.   pregabalin 25 MG capsule Commonly known as: LYRICA Take 25 mg by mouth. Patient taking 1 in the AM, 1 at Lunch, and 3 at night.   promethazine -dextromethorphan 6.25-15 MG/5ML syrup Commonly known as:  PROMETHAZINE -DM Take 5 mLs by mouth 4 (four) times daily as needed.   pyridOXINE 100 MG tablet Commonly known as: VITAMIN B6 Take 100 mg by mouth daily.   spironolactone  25 MG tablet Commonly known as: ALDACTONE  Take 0.5 tablets (12.5 mg total) by mouth daily. Start taking on: November 05, 2023   VITAMIN C PO Take 500 mg by mouth daily.   zinc gluconate 50 MG tablet Take 50 mg by mouth daily.       Allergies  Allergen Reactions   Fosamax [Alendronate Sodium] Other (See Comments)    Bones hurt   Lipitor [Atorvastatin] Other (See Comments)    Bone pain   Codeine Other (See Comments)     Codeine in the liquid form causes gastritis   Hydrocodone  Nausea And Vomiting    Follow-up Information     Wilburn Keller BROCKS, MD. Go in 3 week(s).   Specialty: Cardiology  Contact information: [address] [place] [address] [phone]         Hudson, Caralyn, PA-C Follow up.   Specialty: Cardiology Why: [facility] on 11/10/2023 at 2 PM Contact information: [address] [place] [address] [phone]         Theotis Lavelle BRAVO, MD Follow up.   Specialty: Specialist Contact information: [address] [place] [address] [phone]         [name], MD Follow up.   Specialty: Internal Medicine Contact information: [address] [place] [address] [phone]                  The results of significant diagnostics from this hospitalization (including imaging, microbiology, ancillary and laboratory) are listed below for reference.    Significant Diagnostic Studies: ECHOCARDIOGRAM COMPLETE Result Date: 11/04/2023    ECHOCARDIOGRAM REPORT   Patient Name:   Kimberly Andrade [facility] Date of Exam: 11/03/2023 Medical Rec #:  [id number]             Height:       61.0 in Accession #:    [id number]            Weight:       199.0 lb Date of Birth:  07/12/1945             BSA:           1.885 m Patient Age:    5 years              BP:           94/51 mmHg Patient Gender: F                     HR:           52 bpm. Exam Location:  [facility] Procedure: 2D Echo, Cardiac Doppler, Color Doppler and Intracardiac            Opacification Agent (Both Spectral and Color Flow Doppler were            utilized during procedure). Indications:     CHF I50.31  History:         Patient has no prior history of Echocardiogram examinations.  Sonographer:     Thedora Louder RDCS, FASE Referring Phys:  [id number] NENA PAUDEL Diagnosing Phys: Cara JONETTA Lovelace MD  Sonographer Comments: Technically difficult study due to poor echo windows. Image acquisition challenging due to patient body habitus and Image acquisition challenging due to respiratory motion. IMPRESSIONS  1. Left ventricular ejection fraction, by estimation, is 60 to 65%. The left ventricle has normal function. The left ventricle has no regional wall motion abnormalities. There is mild left ventricular hypertrophy. Left ventricular diastolic parameters are consistent with Grade III diastolic dysfunction (restrictive). There is the interventricular septum is flattened in diastole ('D' shaped left ventricle), consistent with right ventricular volume overload.  2. Right ventricular systolic function is moderately reduced. The right ventricular size is moderately enlarged. Mildly increased right ventricular wall thickness. There is moderately elevated pulmonary artery systolic pressure.  3. Left atrial size was moderately dilated.  4. Right atrial size was moderately dilated.  5. The mitral valve is normal in structure. Mild mitral valve regurgitation. No evidence of mitral stenosis.  6. Tricuspid valve regurgitation is mild to moderate.  7. The aortic valve is normal in structure. Aortic valve regurgitation is trivial. No aortic stenosis is present.  8. The inferior vena cava is normal in size with greater than 50% respiratory variability, suggesting right  atrial pressure of 3 mmHg. Conclusion(s)/Recommendation(s): Poor windows for evaluation of left ventricular function by transthoracic echocardiography. Would recommend an alternative means of evaluation. FINDINGS  Left Ventricle: Left ventricular ejection fraction, by estimation, is 60 to 65%. The left ventricle has normal function. The left ventricle has no regional wall motion abnormalities. Definity  contrast agent was given IV to delineate the left ventricular  endocardial borders. Strain was performed and the global longitudinal strain is indeterminate. The left ventricular internal cavity size was normal in size. There is mild left ventricular hypertrophy. The interventricular septum is flattened in diastole  ('D' shaped left ventricle), consistent with right ventricular volume overload. Left ventricular diastolic parameters are consistent with Grade III diastolic dysfunction (restrictive). Right Ventricle: The right ventricular size is moderately enlarged. Mildly increased right ventricular wall thickness. Right ventricular systolic function is moderately reduced. There is moderately elevated pulmonary artery systolic pressure. Left Atrium: Left atrial size was moderately dilated. Right Atrium: Right atrial size was moderately dilated. Pericardium: There is no evidence of pericardial effusion. Mitral Valve: The mitral valve is normal in structure. Mild mitral valve regurgitation. No evidence of mitral valve stenosis. Tricuspid Valve: The tricuspid valve is normal in structure. Tricuspid valve regurgitation is mild to moderate. No evidence of tricuspid stenosis. Aortic Valve: The aortic valve is normal in structure. Aortic valve regurgitation is trivial. No aortic stenosis is present. Aortic valve peak gradient measures 7.3 mmHg. Pulmonic Valve: The pulmonic valve was normal in structure. Pulmonic valve regurgitation is trivial. No evidence of pulmonic stenosis. Aorta: The aortic root is normal in size and  structure. Venous: The inferior vena cava is normal in size with greater than 50% respiratory variability, suggesting right atrial pressure of 3 mmHg. IAS/Shunts: No atrial level shunt detected by color flow Doppler. Additional Comments: 3D was performed not requiring image post processing on an independent workstation and was indeterminate.  LEFT VENTRICLE PLAX 2D LVIDd:         4.00 cm     Diastology LVIDs:         2.60 cm     LV e' medial:    14.00 cm/s LV PW:         1.10 cm     LV E/e' medial:  9.1 LV IVS:        0.90 cm     LV e' lateral:   10.90 cm/s LVOT diam:     1.80 cm     LV E/e' lateral: 11.7 LV SV:         80 LV SV Index:   42 LVOT Area:     2.54 cm  LV Volumes (MOD) LV vol d, MOD A2C: 70.5 ml LV vol d, MOD A4C: 71.9 ml LV vol s, MOD A2C: 21.2 ml LV vol s, MOD A4C: 30.5 ml LV SV MOD A2C:     49.3 ml LV SV MOD A4C:     71.9 ml LV SV MOD BP:      45.5 ml RIGHT VENTRICLE RV Basal diam:  3.20 cm RV S prime:     18.20 cm/s TAPSE (M-mode): 2.6 cm LEFT ATRIUM             Index        RIGHT ATRIUM           Index LA diam:        5.20 cm 2.76 cm/m  RA Area:     14.60 cm LA Vol (A2C):   56.1 ml 29.77 ml/m  RA Volume:   37.30 ml  19.79 ml/m LA Vol (A4C):   40.0 ml 21.22 ml/m LA Biplane Vol: 47.7 ml 25.31 ml/m  AORTIC VALVE                 PULMONIC VALVE AV Area (Vmax): 2.54 cm     PV Vmax:        1.11 m/s AV Vmax:        135.00 cm/s  PV Peak grad:   4.9 mmHg AV Peak Grad:   7.3 mmHg     RVOT Peak grad: 4 mmHg LVOT Vmax:      135.00 cm/s LVOT Vmean:     89.600 cm/s LVOT VTI:       0.313 m  AORTA Ao Root diam: 2.90 cm Ao Asc diam:  2.90 cm MITRAL VALVE                TRICUSPID VALVE MV Area (PHT): 3.45 cm     TR Peak grad:   46.8 mmHg MV Decel Time: 220 msec     TR Vmax:        342.00 cm/s MV E velocity: 127.00 cm/s MV A velocity: 41.60 cm/s   SHUNTS MV E/A ratio:  3.05         Systemic VTI:  0.31 m                             Systemic Diam: 1.80 cm Dwayne D Callwood MD Electronically signed by Cara JONETTA Lovelace MD Signature Date/Time: 11/04/2023/10:33:55 AM    Final    US  Venous Img Lower Bilateral Result Date: 11/03/2023 EXAM: ULTRASOUND DUPLEX OF THE BILATERAL LOWER EXTREMITY VEINS TECHNIQUE: Duplex ultrasound using B-mode/gray scaled imaging and Doppler spectral analysis and color flow was obtained of the deep venous structures of the bilateral lower extremity. COMPARISON: None. CLINICAL HISTORY: Bilateral leg swelling, afib. FINDINGS: The visualized veins of the lower extremity are patent and free of echogenic thrombus. The veins demonstrate good compressibility with normal color flow study and spectral analysis. IMPRESSION: 1. No evidence of DVT in either lower extremity. Electronically signed by: Selinda Blue MD 11/03/2023 11:21 AM EDT RP Workstation: [id number]   DG Chest 1 View Result Date: 11/03/2023 EXAM: 1 VIEW XRAY OF THE CHEST 11/03/2023 10:11:00 AM COMPARISON: 05/11/2023 CLINICAL HISTORY: New leg swelling recent ablation. Pt to ED via [facility] form home for bilateral leg swelling and pain since this AM. Had cardiac ablation for a fib 5 days ago at [facility]. Sees cardiology at [facility]. Woke up this AM with bilateral leg pain and swelling. FINDINGS: LUNGS AND PLEURA: Low lung volumes. Mild pulmonary edema. Right basilar atelectasis. Possible small right pleural effusion. HEART AND MEDIASTINUM: Mild cardiomegaly. Left atrial appendage occlusion device in place. BONES AND SOFT TISSUES: Right shoulder arthroplasty noted. No acute osseous abnormality. IMPRESSION: 1. Mild pulmonary edema and possible small right pleural effusion. 2. Mild cardiomegaly with left atrial appendage occlusion device in place. Electronically signed by: Lonni Necessary MD 11/03/2023 10:24 AM EDT RP Workstation: [id number]   MM 3D SCREENING MAMMOGRAM BILATERAL BREAST Result Date: 10/17/2023 CLINICAL DATA:  Screening. EXAM: DIGITAL SCREENING BILATERAL MAMMOGRAM WITH TOMOSYNTHESIS AND CAD TECHNIQUE: Bilateral screening digital  craniocaudal and mediolateral oblique mammograms were obtained. Bilateral screening digital breast tomosynthesis was performed. The images were evaluated with computer-aided detection. COMPARISON:  Previous exam(s). ACR Breast Density Category b: There are scattered areas of fibroglandular density. FINDINGS: There are no findings suspicious for malignancy. IMPRESSION: No mammographic evidence of malignancy. A result letter of this screening mammogram will be mailed directly to the patient. RECOMMENDATION: Screening mammogram in one year. (Code:[id number]-B-01Y) BI-RADS CATEGORY  1: Negative. Electronically Signed   By: Dina  Arceo [name]   On: 10/17/2023 04:58    Microbiology: No results found for this or any previous visit (from the past 240 hours).   Labs: Basic Metabolic Panel: Recent Labs  Lab 11/03/23 0950 11/03/23 0952 11/04/23 0451  NA  --  138 141  K  --  3.9 4.1  CL  --  104 106  CO2  --  26 27  GLUCOSE  --  107* 110*  BUN  --  19 18  CREATININE  --  0.90 1.03*  CALCIUM   --  9.1 9.0  MG 2.0  --   --    Liver Function Tests: Recent Labs  Lab 11/03/23 0952 11/04/23 0451  AST 42* 27  ALT 55* 45*  ALKPHOS 30* 25*  BILITOT 0.8 1.5*  PROT 5.9* 5.7*  ALBUMIN 3.4* 3.1*   No results for input(s): LIPASE, AMYLASE in the last 168 hours. No results for input(s): AMMONIA in the last 168 hours. CBC: Recent Labs  Lab 11/03/23 0952 11/04/23 0451  WBC 7.8 6.4  HGB 11.2* 9.9*  HCT 34.5* 30.2*  MCV 87.6 86.8  PLT 204 190   Cardiac Enzymes: No results for input(s): CKTOTAL, CKMB, CKMBINDEX, TROPONINI in the last 168 hours. BNP: BNP (last 3 results) Recent Labs    11/24/22 2331 07/01/23 1425 11/03/23 0950  BNP 174.4* 367.7* 366.8*    ProBNP (last 3 results) No results for input(s): PROBNP in the last 8760 hours.  CBG: No results for input(s): GLUCAP in the last 168 hours.     Signed:  Devaughn KATHEE Ban MD.  [facility] 11/04/2023, 2:57 PM

## 2023-11-04 NOTE — Care Management Obs Status (Signed)
 MEDICARE OBSERVATION STATUS NOTIFICATION   Patient Details  Name: Kimberly Andrade MRN: [id number] Date of Birth: [birth year]   Medicare Observation Status Notification Given:  Yes    Corean ONEIDA Haddock, RN 11/04/2023, 3:52 PM

## 2023-11-05 ENCOUNTER — Telehealth: Payer: Self-pay

## 2023-11-05 NOTE — Transitions of Care (Post Inpatient/ED Visit) (Signed)
   11/05/2023  Name: NALAYA WOJDYLA MRN: [id number] DOB: 1945/09/21  Today's TOC FU Call Status: Today's TOC FU Call Status:: Successful TOC FU Call Completed TOC FU Call Complete Date: 11/05/23 Patient's Name and Date of Birth confirmed.  Transition Care Management Follow-up Telephone Call Date of Discharge: 11/04/23 Discharge Facility: [facility]) Type of Discharge: Inpatient Admission Primary Inpatient Discharge Diagnosis:: HF How have you been since you were released from the hospital?: Better Any questions or concerns?: No  Items Reviewed: Did you receive and understand the discharge instructions provided?: Yes Medications obtained,verified, and reconciled?: Yes (Medications Reviewed) Any new allergies since your discharge?: No Dietary orders reviewed?: Yes Do you have support at home?: Yes People in Home [RPT]: spouse  Medications Reviewed Today: Medications Reviewed Today   Medications were not reviewed in this encounter     Home Care and Equipment/Supplies: Were Home Health Services Ordered?: NA Any new equipment or medical supplies ordered?: NA  Functional Questionnaire: Do you need assistance with bathing/showering or dressing?: No Do you need assistance with meal preparation?: No Do you need assistance with eating?: No Do you have difficulty maintaining continence: No Do you need assistance with getting out of bed/getting out of a chair/moving?: No Do you have difficulty managing or taking your medications?: No  Follow up appointments reviewed: PCP Follow-up appointment confirmed?: Yes Date of PCP follow-up appointment?: 11/11/23 Follow-up Provider: [facility] Follow-up appointment confirmed?: Yes Date of Specialist follow-up appointment?: 11/10/23 Follow-Up Specialty Provider:: cardio Do you need transportation to your follow-up appointment?: No Do you understand care options if your condition(s) worsen?:  Yes-patient verbalized understanding    SIGNATURE Julian Lemmings, LPN [facility] Nurse Health Advisor Direct Dial [phone]

## 2023-11-06 ENCOUNTER — Other Ambulatory Visit
Admission: RE | Admit: 2023-11-06 | Discharge: 2023-11-06 | Disposition: A | Discharge status: Home / Self Care | Source: Ambulatory Visit | Attending: Specialist | Admitting: Specialist

## 2023-11-06 DIAGNOSIS — R0602 Shortness of breath: Secondary | ICD-10-CM | POA: Insufficient documentation

## 2023-11-06 LAB — D-DIMER, QUANTITATIVE: D-Dimer, Quant: 1.63 ug{FEU}/mL — ABNORMAL HIGH (ref 0.00–0.50)

## 2023-11-07 ENCOUNTER — Other Ambulatory Visit: Payer: Self-pay | Admitting: Internal Medicine

## 2023-11-07 ENCOUNTER — Ambulatory Visit
Admission: RE | Admit: 2023-11-07 | Discharge: 2023-11-07 | Disposition: A | Discharge status: Home / Self Care | Source: Ambulatory Visit | Attending: Internal Medicine | Admitting: Internal Medicine

## 2023-11-07 DIAGNOSIS — R0602 Shortness of breath: Secondary | ICD-10-CM | POA: Insufficient documentation

## 2023-11-07 DIAGNOSIS — R7989 Other specified abnormal findings of blood chemistry: Secondary | ICD-10-CM | POA: Diagnosis present

## 2023-11-07 MED ORDER — IOHEXOL 350 MG/ML SOLN
75.0000 mL | Freq: Once | INTRAVENOUS | Status: AC | PRN
Start: 2023-11-07 — End: 2023-11-07
  Administered 2023-11-07: 75 mL via INTRAVENOUS

## 2023-11-11 ENCOUNTER — Encounter: Payer: Self-pay | Admitting: Internal Medicine

## 2023-11-11 ENCOUNTER — Ambulatory Visit: Admitting: Internal Medicine

## 2023-11-11 VITALS — BP 112/72 | HR 72 | Ht 61.0 in | Wt 189.6 lb

## 2023-11-11 DIAGNOSIS — I10 Essential (primary) hypertension: Secondary | ICD-10-CM

## 2023-11-11 DIAGNOSIS — I50811 Acute right heart failure: Secondary | ICD-10-CM

## 2023-11-11 DIAGNOSIS — J479 Bronchiectasis, uncomplicated: Secondary | ICD-10-CM | POA: Diagnosis not present

## 2023-11-11 DIAGNOSIS — I480 Paroxysmal atrial fibrillation: Secondary | ICD-10-CM | POA: Diagnosis not present

## 2023-11-11 NOTE — Assessment & Plan Note (Signed)
 Likely related to ablation and placement of Watchman atrial appendage device. She is improving, diuresing well without CP or shortness of breath. Will follow up at [facility] for recheck.

## 2023-11-11 NOTE — Assessment & Plan Note (Signed)
 She continues on lasix  but cardiology has her holding metoprolol  and losartan  for now.

## 2023-11-11 NOTE — Assessment & Plan Note (Signed)
 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

## 2023-11-11 NOTE — Assessment & Plan Note (Signed)
 On 10/29/2023, Dr. Franky Ned and Dr. Prentice Netters proceeded with catheter ablation of Afib and Watchman implant. Patient will resume metoprolol  for rate/rhythm control and Eliquis  for anticoagulation As well as implantation of left atrial appendage closure device.

## 2023-11-11 NOTE — Progress Notes (Signed)
 Date:  11/11/2023   Name:  Kimberly Andrade   DOB:  06/10/1945   MRN:  [id number]   Chief Complaint: Hospitalization Follow-up Hospital follow up.  Admitted to [facility] 11/03/23 to 11/04/23. TOC call done on 11/05/23. Since discharge she has seen Dr. Theotis of Pulmonary and Dr. Florencio of Cardiology.  History of present illness:  From admission h and p Kimberly Andrade is a pleasant 78 y.o. female with medical history significant for atrial fibrillation s/p ablation for  atrial fibrillation 5 days ago at [facility] along with Watchman procedure, HTN, HLD, GERD OSA on CPAP, stable angina, who woke up this morning with bilateral leg swelling pain and shortness of breath.  Patient stated that she was at [facility] for atrial fibrillation 5 days ago.  She was fine until last night.  States she woke up this morning with those problems and decided to call EMS who brought her here at [facility] emergency room.  Patient denies any fever, chills, nausea, vomiting, chest pain, palpitations.  She denies any dysuria, cough.  She stated that she never had history of congestive heart failure and never had taken diuretics.    Hospital Course:  Patient presents with lower extremity swelling. She denies dyspnea to the provider who is discharging her, though this is listed in the hpi. Here cardiology was consulted and the patient was diuresed. Edema is resolved. TTE shows RV dysfunction with pHTN. Patient with recent afib embolization and placement of watchman device. Cardiology advises start lasix  and spironolactone  and farxiga  (patient declines farxiga  given cost concern). Bradycardic here, they advise holding home metop and also losartan  given soft pressures. PVLs negative for DVT; discharge provider advised w/u for PE (unlikely given patient's report of compliance with anticoagulation, but still possible), but patient declines this, instead insists on discharge. We reviewed the risks of undiagnosed PE and patient is aware she is  leaving against medical advice. She has a history of bronchiectasis so certainly possible the pHTN is secondary to this - she prefers to f/u with her pulmonologist dr. Theotis. She has cardiology f/u scheduled for next week.   Recommendations for Outpatient Follow-up:  Cardiology f/u next week as scheduled Pulmonology f/u Pcp f/u     Discharge Diagnoses:  Principal Problem:   Acute heart failure (HCC) Active Problems:   Obstructive sleep apnea of adult   Mixed hyperlipidemia   GERD (gastroesophageal reflux disease)   Crohn's disease of small intestine without complication (HCC)   Essential hypertension   Paroxysmal A-fib (HCC)  Review of Systems  Constitutional:  Positive for fatigue. Negative for chills, fever and unexpected weight change.  Respiratory:  Negative for chest tightness, shortness of breath and wheezing.   Cardiovascular:  Negative for chest pain, palpitations and leg swelling.  Musculoskeletal:  Positive for arthralgias and gait problem.  Neurological:  Negative for dizziness and headaches.  Psychiatric/Behavioral:  Negative for dysphoric mood. The patient is not nervous/anxious.      Lab Results  Component Value Date   NA 141 11/04/2023   K 4.1 11/04/2023   CO2 27 11/04/2023   GLUCOSE 110 (H) 11/04/2023   BUN 18 11/04/2023   CREATININE 1.03 (H) 11/04/2023   CALCIUM  9.0 11/04/2023   EGFR 65 02/17/2023   GFRNONAA 56 (L) 11/04/2023   Lab Results  Component Value Date   CHOL 155 02/17/2023   HDL 50 02/17/2023   LDLCALC 83 02/17/2023   TRIG 123 02/17/2023   CHOLHDL 3.1 02/17/2023   Lab Results  Component Value Date   TSH 2.340 02/17/2023   Lab Results  Component Value Date   HGBA1C 6.0 (H) 02/17/2023   Lab Results  Component Value Date   WBC 6.4 11/04/2023   HGB 9.9 (L) 11/04/2023   HCT 30.2 (L) 11/04/2023   MCV 86.8 11/04/2023   PLT 190 11/04/2023   Lab Results  Component Value Date   ALT 45 (H) 11/04/2023   AST 27 11/04/2023   ALKPHOS  25 (L) 11/04/2023   BILITOT 1.5 (H) 11/04/2023   Lab Results  Component Value Date   VD25OH 42.5 02/13/2022     Patient Active Problem List   Diagnosis Date Noted   Body mass index (BMI) 31.0-31.9, adult 11/04/2023   Acute heart failure (HCC) 11/03/2023   S/P ablation of atrial fibrillation 10/30/2023   Presence of Watchman left atrial appendage closure device 10/30/2023   Paroxysmal A-fib (HCC) 01/27/2023   Polyneuropathy 08/21/2022   Bronchiectasis without complication (HCC) 02/13/2022   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   Lymphedema 03/30/2021   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   Essential hypertension 08/16/2019   Morton's neuroma 07/27/2019   Atherosclerosis of abdominal aorta (HCC) 03/16/2018   DDD (degenerative disc disease), 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/2017   Prediabetes 12/25/2016   Nocturnal leg cramps 12/25/2016   Postmenopausal osteoporosis 10/16/2016   Urinary incontinence in female 10/16/2016   Mixed hyperlipidemia 10/05/2015   GERD (gastroesophageal reflux disease) 04/24/2015   Crohn's disease of small intestine without complication (HCC) 04/24/2015   Obstructive sleep apnea of adult 11/18/2013    Allergies  Allergen Reactions   Fosamax [Alendronate Sodium] Other (See Comments)    Bones hurt   Lipitor [Atorvastatin] Other (See Comments)    Bone pain   Codeine Other (See Comments)     Codeine in the liquid form causes gastritis   Hydrocodone  Nausea And Vomiting    Past Surgical History:  Procedure Laterality Date   ABDOMINAL HYSTERECTOMY     APPENDECTOMY     BIOPSY  03/26/2023   Procedure: BIOPSY;  Surgeon: Aundria, Ladell POUR, MD;  Location: [facility] ENDOSCOPY;  Service: Gastroenterology;;   REDELL MEDIATE     CATARACT EXTRACTION W/PHACO Right 05/28/2022   Procedure: CATARACT EXTRACTION PHACO AND INTRAOCULAR LENS PLACEMENT (IOC) RIGHT  5.21  00:37.2;  Surgeon: Jaye Fallow, MD;  Location: [facility] SURGERY [facility]TR;  Service: Ophthalmology;  Laterality: Right;  sleep  apnea   CATARACT EXTRACTION W/PHACO Left 06/18/2022   Procedure: CATARACT EXTRACTION PHACO AND INTRAOCULAR LENS PLACEMENT (IOC) LEFT 5.64 00:39.4;  Surgeon: Jaye Fallow, MD;  Location: [facility]GERY [facility];  Service: Ophthalmology;  Laterality: Left;  sleep apnea   CHOLECYSTECTOMY     COLONOSCOPY N/A 03/26/2023   Procedure: COLONOSCOPY;  Surgeon: Toledo, Ladell POUR, MD;  Location: [facility] ENDOSCOPY;  Service: Gastroenterology;  Laterality: N/A;   COLONOSCOPY WITH PROPOFOL  N/A 03/11/2016   Procedure: COLONOSCOPY WITH PROPOFOL ;  Surgeon: Gladis RAYMOND Mariner, MD;  Location: [facility] ENDOSCOPY;  Service: Endoscopy;  Laterality: N/A;   DILATION AND CURETTAGE OF UTERUS     ESOPHAGOGASTRODUODENOSCOPY N/A 03/11/2016   Procedure: ESOPHAGOGASTRODUODENOSCOPY (EGD);  Surgeon: Gladis RAYMOND Mariner, MD;  Location: [facility] ENDOSCOPY;  Service: Endoscopy;  Laterality: N/A;   ESOPHAGOGASTRODUODENOSCOPY (EGD) WITH PROPOFOL  N/A 11/11/2017   Procedure: ESOPHAGOGASTRODUODENOSCOPY (EGD) WITH PROPOFOL ;  Surgeon: Mariner Gladis RAYMOND, MD;  Location: [facility] ENDOSCOPY;  Service: Endoscopy;  Laterality: N/A;   ESOPHAGOGASTRODUODENOSCOPY (EGD) WITH PROPOFOL  N/A 01/20/2018   Procedure: ESOPHAGOGASTRODUODENOSCOPY (EGD) WITH PROPOFOL ;  Surgeon: Mariner Gladis RAYMOND, MD;  Location: [facility] ENDOSCOPY;  Service: Endoscopy;  Laterality: N/A;   FOOT SURGERY Right    HAMMER TOE SURGERY     JOINT REPLACEMENT Right 2006   shoulder   KNEE ARTHROSCOPY Right 05/25/2013   Procedure: ARTHROSCOPY KNEE;  Surgeon: Maude KANDICE Herald, MD;  Location: [facility];  Service: Orthopedics;  Laterality: Right;  medial and lateral meniscal debridment and chondroplasty   RADIOFREQUENCY ABLATION NERVES Bilateral 07/2022   lumbar spine   righti shoulder replacement      ROBOTIC ASSISTED LAPAROSCOPIC REPAIR OF PARAESOPHAGEAL HERNIA  07/16/2018   SHOULDER ARTHROSCOPY WITH ROTATOR CUFF REPAIR AND SUBACROMIAL DECOMPRESSION Left 08/14/2017   Procedure: SHOULDER  ARTHROSCOPY WITH ROTATOR CUFF REPAIR AND SUBACROMIAL DECOMPRESSION;  Surgeon: Dozier Soulier, MD;  Location: [facility] OR;  Service: Orthopedics;  Laterality: Left;   SHOULDER SURGERY Right    X 3   TOTAL HIP ARTHROPLASTY Left 11/25/2017   Procedure: LEFT TOTAL HIP ARTHROPLASTY ANTERIOR APPROACH;  Surgeon: Herald Maude, MD;  Location: [facility] OR;  Service: Orthopedics;  Laterality: Left;   TOTAL KNEE ARTHROPLASTY Right 04/18/2020   Procedure: RIGHT TOTAL KNEE ARTHROPLASTY;  Surgeon: Herald Maude, MD;  Location: [facility] ORS;  Service: Orthopedics;  Laterality: Right;    Social History   Tobacco Use   Smoking status: Former    Current packs/day: 0.00    Types: Cigarettes    Quit date: 12/29/2001    Years since quitting: 21.8   Smokeless tobacco: Never  Vaping Use   Vaping status: Never Used  Substance Use Topics   Alcohol use: No   Drug use: No     Medication list has been reviewed and updated.  Current Meds  Medication Sig   albuterol  (VENTOLIN  HFA) 108 (90 Base) MCG/ACT inhaler    apixaban  (ELIQUIS ) 5 MG TABS tablet Take 1 tablet (5 mg total) by mouth 2 (two) times daily.   Ascorbic Acid (VITAMIN C PO) Take 500 mg by mouth daily.   Calcium  Carbonate-Vit D-Min (CALCIUM  600+D3 PLUS MINERALS) 600-800 MG-UNIT TABS Take 1 tablet by mouth daily.   cetirizine  (ZYRTEC ) 5 MG tablet Take 1 tablet (5 mg total) by mouth daily.   cholecalciferol  (VITAMIN D ) 25 MCG (1000 UNIT) tablet Take 1,000 Units by mouth daily.   ferrous sulfate  324 MG TBEC Take 324 mg by mouth daily with breakfast.   fluticasone  (FLONASE ) 50 MCG/ACT nasal spray Place 1 spray into both nostrils 2 (two) times daily.   furosemide  (LASIX ) 20 MG tablet Take 1 tablet (20 mg total) by mouth daily.   Magnesium  250 MG TABS Take 500 mg by mouth daily.   mesalamine  (PENTASA ) 500 MG CR capsule Take 2,000 mg by mouth 2 (two) times daily as needed (Crohn's).   Multiple Vitamins-Minerals (MULTIVITAMIN WITH MINERALS) tablet Take 1 tablet by  mouth daily.   NON FORMULARY BiPap nightly   Omega-3 Fatty Acids (FISH OIL PO) Take 1,200 mg by mouth at bedtime.   oxybutynin  (DITROPAN  XL) 15 MG 24 hr tablet Take 1 tablet (15 mg total) by mouth at bedtime.   pantoprazole  (PROTONIX ) 40 MG tablet Take 40 mg by mouth every morning.   Potassium 99 MG TABS Take 99 mg by mouth daily.    pravastatin  (PRAVACHOL ) 40 MG tablet Take 1 tablet (40 mg total) by mouth daily.   pregabalin (LYRICA) 25 MG capsule Take 25 mg by mouth. Patient taking 1 in the AM, 1 at Lunch, and 3 at night.   pyridOXINE (VITAMIN B6) 100 MG tablet Take 100 mg by mouth  daily.   spironolactone  (ALDACTONE ) 25 MG tablet Take 0.5 tablets (12.5 mg total) by mouth daily.   vitamin B-12 (CYANOCOBALAMIN ) 1000 MCG tablet Take 1,000 mcg by mouth daily.   zinc gluconate 50 MG tablet Take 50 mg by mouth daily.   [DISCONTINUED] benzonatate  (TESSALON ) 200 MG capsule Take 1 capsule (200 mg total) by mouth 3 (three) times daily as needed for cough.   [DISCONTINUED] promethazine -dextromethorphan (PROMETHAZINE -DM) 6.25-15 MG/5ML syrup Take 5 mLs by mouth 4 (four) times daily as needed.       11/11/2023    1:21 PM 09/03/2023    3:02 PM 07/21/2023    9:03 AM 04/07/2023    9:18 AM  GAD 7 : Generalized Anxiety Score  Nervous, Anxious, on Edge 0 0 0 0  Control/stop worrying 0 0 0 0  Worry too much - different things 0 0 0 0  Trouble relaxing 0 0 0 0  Restless 0 0 0 0  Easily annoyed or irritable 0 0 0 1  Afraid - awful might happen 0 0 0 0  Total GAD 7 Score 0 0 0 1  Anxiety Difficulty Not difficult at all Not difficult at all Not difficult at all Not difficult at all       11/11/2023    1:21 PM 09/03/2023    3:02 PM 07/21/2023    9:03 AM  Depression screen PHQ 2/9  Decreased Interest 1 0 0  Down, Depressed, Hopeless 1 0 0  PHQ - 2 Score 2 0 0  Altered sleeping 0 0 0  Tired, decreased energy 0 0 0  Change in appetite 0 0 0  Feeling bad or failure about yourself  0 0 0  Trouble  concentrating 0 0 0  Moving slowly or fidgety/restless 0 0 0  Suicidal thoughts 0 0 0  PHQ-9 Score 2 0 0  Difficult doing work/chores Somewhat difficult Not difficult at all Not difficult at all    BP Readings from Last 3 Encounters:  11/11/23 112/72  11/04/23 (!) 104/48  09/03/23 122/78    Physical Exam Vitals and nursing note reviewed.  Constitutional:      General: She is not in acute distress.    Appearance: She is well-developed. She is not ill-appearing.  HENT:     Head: Normocephalic and atraumatic.  Cardiovascular:     Rate and Rhythm: Normal rate and regular rhythm.  Pulmonary:     Effort: Pulmonary effort is normal. No respiratory distress.     Breath sounds: No wheezing or rhonchi.  Musculoskeletal:     Cervical back: Normal range of motion.     Right lower leg: No edema.     Left lower leg: No edema.  Lymphadenopathy:     Cervical: No cervical adenopathy.  Skin:    General: Skin is warm and dry.     Findings: No rash.  Neurological:     Mental Status: She is alert and oriented to person, place, and time.  Psychiatric:        Mood and Affect: Mood normal.        Behavior: Behavior normal.     Wt Readings from Last 3 Encounters:  11/11/23 189 lb 9.6 oz (86 kg)  11/04/23 194 lb 12.8 oz (88.4 kg)  09/03/23 188 lb (85.3 kg)    BP 112/72   Pulse 72   Ht 5' 1 (1.549 m)   Wt 189 lb 9.6 oz (86 kg)   SpO2 96%   BMI 35.82  kg/m   Assessment and Plan:  Problem List Items Addressed This Visit       Unprioritized   Essential hypertension (Chronic)   She continues on lasix  but cardiology has her holding metoprolol  and losartan  for now.      Paroxysmal A-fib (HCC) (Chronic)   On 10/29/2023, Dr. Franky Ned and Dr. Prentice Netters proceeded with catheter ablation of Afib and Watchman implant. Patient will resume metoprolol  for rate/rhythm control and Eliquis  for anticoagulation As well as implantation of left atrial appendage closure device.        Bronchiectasis without complication (HCC) - Primary   Per recent pulmonary note: Mild ground glass/pulm fibrosis and minimal bronchiectasis, cough hardly now on present regimen supportive care ( mucinex, flutter valve) Albuterol  vs xoponex 2 puff qid prn ( following hr) Arnuity 100 one puff qday F/u in 4 months,       Acute heart failure (HCC)   Likely related to ablation and placement of Watchman atrial appendage device. She is improving, diuresing well without CP or shortness of breath. Will [facility] for recheck.       No follow-ups on file.    Leita HILARIO Adie, MD [facility] [place]

## 2023-11-26 ENCOUNTER — Ambulatory Visit: Attending: Internal Medicine

## 2023-11-26 DIAGNOSIS — G4733 Obstructive sleep apnea (adult) (pediatric): Secondary | ICD-10-CM | POA: Insufficient documentation

## 2024-01-05 ENCOUNTER — Ambulatory Visit
Admission: RE | Admit: 2024-01-05 | Discharge: 2024-01-05 | Disposition: A | Discharge status: Home / Self Care | Attending: Cardiology | Admitting: Cardiology

## 2024-01-05 ENCOUNTER — Encounter
Admission: RE | Disposition: A | Discharge status: Home / Self Care | Payer: Self-pay | Source: Home / Self Care | Attending: Cardiology

## 2024-01-05 ENCOUNTER — Encounter: Payer: Self-pay | Admitting: Cardiology

## 2024-01-05 ENCOUNTER — Ambulatory Visit: Admitting: Anesthesiology

## 2024-01-05 ENCOUNTER — Ambulatory Visit
Admission: RE | Admit: 2024-01-05 | Discharge: 2024-01-05 | Disposition: A | Source: Home / Self Care | Attending: Student | Admitting: Cardiology

## 2024-01-05 DIAGNOSIS — R0902 Hypoxemia: Secondary | ICD-10-CM | POA: Insufficient documentation

## 2024-01-05 DIAGNOSIS — I480 Paroxysmal atrial fibrillation: Secondary | ICD-10-CM

## 2024-01-05 DIAGNOSIS — I4819 Other persistent atrial fibrillation: Secondary | ICD-10-CM | POA: Insufficient documentation

## 2024-01-05 DIAGNOSIS — I4891 Unspecified atrial fibrillation: Secondary | ICD-10-CM | POA: Diagnosis present

## 2024-01-05 HISTORY — PX: CARDIOVERSION: SHX1299

## 2024-01-05 HISTORY — PX: TEE WITHOUT CARDIOVERSION: SHX5443

## 2024-01-05 SURGERY — ECHOCARDIOGRAM, TRANSESOPHAGEAL
Anesthesia: General

## 2024-01-05 MED ORDER — SODIUM CHLORIDE 0.9 % IV SOLN: INTRAVENOUS | Status: DC

## 2024-01-05 MED ORDER — LIDOCAINE VISCOUS HCL 2 % MT SOLN
OROMUCOSAL | Status: AC
  Filled 2024-01-05: qty 15

## 2024-01-05 MED ORDER — PROPOFOL 500 MG/50ML IV EMUL
INTRAVENOUS | Status: DC | PRN
  Administered 2024-01-05: 20 mg via INTRAVENOUS
  Administered 2024-01-05: 30 mg via INTRAVENOUS
  Administered 2024-01-05: 20 mg via INTRAVENOUS

## 2024-01-05 MED ORDER — BUTAMBEN-TETRACAINE-BENZOCAINE 2-2-14 % EX AERO
INHALATION_SPRAY | CUTANEOUS | Status: AC
  Filled 2024-01-05: qty 5

## 2024-01-05 MED ORDER — AMIODARONE HCL 200 MG PO TABS: ORAL_TABLET | ORAL | 0 refills | Status: AC

## 2024-01-05 NOTE — Transfer of Care (Signed)
 Immediate Anesthesia Transfer of Care Note  Patient: Kimberly Andrade  Procedure(s) Performed: ECHOCARDIOGRAM, TRANSESOPHAGEAL CARDIOVERSION  Patient Location: PACU  Anesthesia Type:General  Level of Consciousness: awake and patient cooperative  Airway & Oxygen Therapy: Patient Spontanous Breathing and Patient connected to nasal cannula oxygen  Post-op Assessment: Report given to RN and Post -op Vital signs reviewed and stable  Post vital signs: stable  Last Vitals:  Vitals Value Taken Time  BP    Temp    Pulse    Resp    SpO2      Last Pain:  Vitals:   01/05/24 1212  TempSrc: Oral  PainSc: 0-No pain         Complications: No notable events documented.

## 2024-01-05 NOTE — Anesthesia Preprocedure Evaluation (Signed)
 Anesthesia Evaluation  Patient identified by MRN, date of birth, ID band Patient awake    Reviewed: Allergy & Precautions, NPO status , Patient's Chart, lab work & pertinent test results  History of Anesthesia Complications Negative for: history of anesthetic complications  Airway Mallampati: II  TM Distance: >3 FB Neck ROM: Full    Dental  (+) Edentulous Lower, Upper Dentures, Dental Advidsory Given Upper dentures firmly bonded in place:   Pulmonary shortness of breath and with exertion, asthma , sleep apnea and Continuous Positive Airway Pressure Ventilation , neg COPD, neg recent URI, Patient abstained from smoking.Not current smoker, former smoker   Pulmonary exam normal breath sounds clear to auscultation       Cardiovascular Exercise Tolerance: Good METShypertension, Pt. on medications + angina  (-) CAD, (-) Past MI and (-) Cardiac Stents + dysrhythmias Atrial Fibrillation (-) Valvular Problems/Murmurs Rhythm:Regular Rate:Normal - Systolic murmurs    Neuro/Psych negative neurological ROS  negative psych ROS   GI/Hepatic Neg liver ROS,GERD  Medicated and Controlled,,Crohn disease    Endo/Other  negative endocrine ROSneg diabetes    Renal/GU negative Renal ROS     Musculoskeletal  (+) Arthritis ,    Abdominal   Peds  Hematology HLD   Anesthesia Other Findings Past Medical History: No date: Arthritis No date: B12 deficiency No date: Bronchiectasis (HCC)     Comment:  pulmonology No date: Crohn's disease (HCC) No date: Diverticulosis No date: DJD (degenerative joint disease) 08/16/2019: Essential hypertension No date: Full dentures No date: GERD (gastroesophageal reflux disease)     Comment:  pt deneis  No date: Heart palpitations No date: Hypercholesterolemia No date: Hyperlipidemia No date: Incontinence of urine No date: Nocturnal leg cramps No date: Osteopenia No date: Osteoporosis No date:  Reflux No date: Sleep apnea     Comment:  uses a cpap No date: Vertigo     Comment:  last episode 07/2021 No date: Wears glasses  Reproductive/Obstetrics                              Anesthesia Physical Anesthesia Plan  ASA: 2  Anesthesia Plan: General   Post-op Pain Management: Minimal or no pain anticipated   Induction: Intravenous  PONV Risk Score and Plan: 2 and Propofol  infusion and TIVA  Airway Management Planned: Nasal Cannula and Natural Airway  Additional Equipment: None  Intra-op Plan:   Post-operative Plan:   Informed Consent: I have reviewed the patients History and Physical, chart, labs and discussed the procedure including the risks, benefits and alternatives for the proposed anesthesia with the patient or authorized representative who has indicated his/her understanding and acceptance.     Dental advisory given  Plan Discussed with: CRNA and Surgeon  Anesthesia Plan Comments: (Discussed risks of anesthesia with patient, including possibility of difficulty with spontaneous ventilation under anesthesia necessitating airway intervention, PONV, and rare risks such as cardiac or respiratory or neurological events, and allergic reactions. Discussed the role of CRNA in patient's perioperative care. Patient understands.)         Anesthesia Quick Evaluation

## 2024-01-05 NOTE — Procedures (Signed)
 Electrical Cardioversion Procedure Note  Indication: Atrial fibrillation  Procedure Details: Consent: Indication, Risk/benefits of procedure as well as the alternatives explained to patient and informed consent obtained. Time out performed. Verified patient identification, verified procedure, verified correct patient position, special equipment/implants available, medications/allergies/relevent history reviewed, required imaging and test results reviewed.  Deep sedation was provided by anesthesia with propofol. Patient was delivered with 200 Joules of electricity X 1 with success to Sinus rhythm. Patient tolerated the procedure well. No immediate complication noted.   Successful cardioversion  Windell Norfolk, MD [facility] Cardiology- [facility]

## 2024-01-06 ENCOUNTER — Encounter: Payer: Self-pay | Admitting: Cardiology

## 2024-01-06 LAB — ECHO TEE

## 2024-01-24 NOTE — Anesthesia Postprocedure Evaluation (Signed)
 Anesthesia Post Note  Patient: Kimberly Andrade  Procedure(s) Performed: ECHOCARDIOGRAM, TRANSESOPHAGEAL CARDIOVERSION  Patient location during evaluation: Specials Recovery Anesthesia Type: General Level of consciousness: awake and alert Pain management: pain level controlled Vital Signs Assessment: post-procedure vital signs reviewed and stable Respiratory status: spontaneous breathing, nonlabored ventilation, respiratory function stable and patient connected to nasal cannula oxygen Cardiovascular status: blood pressure returned to baseline and stable Postop Assessment: no apparent nausea or vomiting Anesthetic complications: no   There were no known notable events for this encounter.   Last Vitals:  Vitals:   01/05/24 1415 01/05/24 1430  BP: (!) 94/54 125/60  Pulse: (!) 50 (!) 48  Resp: 17 17  Temp:    SpO2: 94% 95%    Last Pain:  Vitals:   01/05/24 1430  TempSrc:   PainSc: 0-No pain                 Prentice Murphy

## 2024-02-06 LAB — BASIC METABOLIC PANEL WITH GFR
BUN: 20 (ref 4–21)
CO2: 29 — AB (ref 13–22)
Chloride: 106 (ref 99–108)
Creatinine: 0.9 (ref 0.5–1.1)
Glucose: 105
Potassium: 4.2 meq/L (ref 3.5–5.1)
Sodium: 142 (ref 137–147)

## 2024-02-06 LAB — COMPREHENSIVE METABOLIC PANEL WITH GFR
Calcium: 9.4 (ref 8.7–10.7)
eGFR: 65

## 2024-02-12 ENCOUNTER — Ambulatory Visit: Admitting: Anesthesiology

## 2024-02-12 ENCOUNTER — Encounter: Admission: RE | Disposition: A | Payer: Self-pay | Attending: Cardiology

## 2024-02-12 ENCOUNTER — Encounter: Payer: Self-pay | Admitting: Cardiology

## 2024-02-12 ENCOUNTER — Ambulatory Visit
Admission: RE | Admit: 2024-02-12 | Discharge: 2024-02-12 | Disposition: A | Attending: Cardiology | Admitting: Cardiology

## 2024-02-12 DIAGNOSIS — I509 Heart failure, unspecified: Secondary | ICD-10-CM | POA: Diagnosis not present

## 2024-02-12 DIAGNOSIS — I110 Hypertensive heart disease with heart failure: Secondary | ICD-10-CM | POA: Diagnosis not present

## 2024-02-12 DIAGNOSIS — I480 Paroxysmal atrial fibrillation: Secondary | ICD-10-CM

## 2024-02-12 DIAGNOSIS — I272 Pulmonary hypertension, unspecified: Secondary | ICD-10-CM | POA: Diagnosis not present

## 2024-02-12 DIAGNOSIS — G4730 Sleep apnea, unspecified: Secondary | ICD-10-CM | POA: Diagnosis not present

## 2024-02-12 DIAGNOSIS — K219 Gastro-esophageal reflux disease without esophagitis: Secondary | ICD-10-CM | POA: Diagnosis not present

## 2024-02-12 DIAGNOSIS — I252 Old myocardial infarction: Secondary | ICD-10-CM | POA: Diagnosis not present

## 2024-02-12 DIAGNOSIS — Z6837 Body mass index (BMI) 37.0-37.9, adult: Secondary | ICD-10-CM | POA: Diagnosis not present

## 2024-02-12 DIAGNOSIS — M1990 Unspecified osteoarthritis, unspecified site: Secondary | ICD-10-CM | POA: Diagnosis not present

## 2024-02-12 DIAGNOSIS — I4892 Unspecified atrial flutter: Secondary | ICD-10-CM | POA: Diagnosis present

## 2024-02-12 DIAGNOSIS — I440 Atrioventricular block, first degree: Secondary | ICD-10-CM | POA: Diagnosis not present

## 2024-02-12 DIAGNOSIS — K5090 Crohn's disease, unspecified, without complications: Secondary | ICD-10-CM | POA: Diagnosis not present

## 2024-02-12 DIAGNOSIS — R42 Dizziness and giddiness: Secondary | ICD-10-CM | POA: Diagnosis not present

## 2024-02-12 DIAGNOSIS — E669 Obesity, unspecified: Secondary | ICD-10-CM | POA: Diagnosis not present

## 2024-02-12 DIAGNOSIS — Z87891 Personal history of nicotine dependence: Secondary | ICD-10-CM | POA: Diagnosis not present

## 2024-02-12 HISTORY — PX: CARDIOVERSION: SHX1299

## 2024-02-12 SURGERY — CARDIOVERSION
Anesthesia: General

## 2024-02-12 MED ORDER — PROPOFOL 500 MG/50ML IV EMUL
INTRAVENOUS | Status: DC | PRN
  Administered 2024-02-12: 40 mg via INTRAVENOUS

## 2024-02-12 MED ORDER — SODIUM CHLORIDE 0.9 % IV SOLN: INTRAVENOUS | Status: DC

## 2024-02-12 NOTE — Anesthesia Preprocedure Evaluation (Addendum)
 Anesthesia Evaluation  Patient identified by MRN, date of birth, ID band Patient awake    Reviewed: Allergy & Precautions, NPO status , Patient's Chart, lab work & pertinent test results  History of Anesthesia Complications Negative for: history of anesthetic complications  Airway Mallampati: I   Neck ROM: Full    Dental  (+) Upper Dentures, Lower Dentures   Pulmonary sleep apnea and Continuous Positive Airway Pressure Ventilation , former smoker (quit 2003)   Pulmonary exam normal breath sounds clear to auscultation       Cardiovascular hypertension, pulmonary hypertension (grade 2)+CHF  + dysrhythmias (a fib s/p Watchman 10/2023; on Eliquis )  Rhythm:Irregular Rate:Normal  Echo 02/02/24: An intracardiac LAA occluder device is visualized within the left atrial appendage with evidence of incomplete occlusion. A residual gap measuring 0.4 cm is present between the device and the LAA opening along the posterior margin of the LAA ostium. The LAA occluder device otherwise appears securely seated without rocking or instability.   Myocardial perfusion 12/17/22:  Normal myocardial perfusion scan no evidence of stress-induced myocardial ischemia ejection fraction of 75% conclusion normal scan.  This is a low risk study     Neuro/Psych Vertigo     GI/Hepatic ,GERD  ,,Crohn disease   Endo/Other  Obesity   Renal/GU negative Renal ROS     Musculoskeletal  (+) Arthritis ,    Abdominal   Peds  Hematology negative hematology ROS (+)   Anesthesia Other Findings   Reproductive/Obstetrics                              Anesthesia Physical Anesthesia Plan  ASA: 3  Anesthesia Plan: General   Post-op Pain Management:    Induction: Intravenous  PONV Risk Score and Plan: 3 and Propofol  infusion, TIVA and Treatment may vary due to age or medical condition  Airway Management Planned: Natural  Airway  Additional Equipment:   Intra-op Plan:   Post-operative Plan:   Informed Consent: I have reviewed the patients History and Physical, chart, labs and discussed the procedure including the risks, benefits and alternatives for the proposed anesthesia with the patient or authorized representative who has indicated his/her understanding and acceptance.       Plan Discussed with: CRNA  Anesthesia Plan Comments: (LMA/GETA backup discussed.  Patient consented for risks of anesthesia including but not limited to:  - adverse reactions to medications - damage to eyes, teeth, lips or other oral mucosa - nerve damage due to positioning  - sore throat or hoarseness - damage to heart, brain, nerves, lungs, other parts of body or loss of life  Informed patient about role of CRNA in peri- and intra-operative care.  Patient voiced understanding.)         Anesthesia Quick Evaluation

## 2024-02-12 NOTE — CV Procedure (Signed)
Electrical Cardioversion Procedure Note  Indication: Atrial flutter  Procedure Details: Consent: Indication, Risk/benefits of procedure as well as the alternatives explained to patient and informed consent obtained. Time out performed. Verified patient identification, verified procedure, verified correct patient position, special equipment/implants available, medications/allergies/relevent history reviewed, required imaging and test results reviewed.  Deep sedation was provided by anesthesia with propofol. Patient was delivered with 200 Joules of electricity X 1 with success to Sinus rhythm. Patient tolerated the procedure well. No immediate complication noted.   Successful cardioversion  Kimberly Norfolk, MD [facility] Cardiology- [facility]

## 2024-02-12 NOTE — Anesthesia Postprocedure Evaluation (Signed)
 Anesthesia Post Note  Patient: Kimberly Andrade  Procedure(s) Performed: CARDIOVERSION  Patient location during evaluation: PACU Anesthesia Type: General Level of consciousness: awake and alert, oriented and patient cooperative Pain management: pain level controlled Vital Signs Assessment: post-procedure vital signs reviewed and stable Respiratory status: spontaneous breathing, nonlabored ventilation and respiratory function stable Cardiovascular status: blood pressure returned to baseline and stable Postop Assessment: adequate PO intake Anesthetic complications: no   No notable events documented.   Last Vitals:  Vitals:   02/12/24 1230 02/12/24 1315  BP: (!) 147/95 110/73  Pulse: 75 (!) 56  Resp: 15 15  Temp: 36.9 C   SpO2: 94% 91%    Last Pain:  Vitals:   02/12/24 1315  TempSrc:   PainSc: 0-No pain                 Alfonso Ruths

## 2024-02-12 NOTE — Transfer of Care (Signed)
 Immediate Anesthesia Transfer of Care Note  Patient: Kimberly Andrade  Procedure(s) Performed: CARDIOVERSION  Patient Location: PACU  Anesthesia Type:General  Level of Consciousness: awake, alert , and oriented  Airway & Oxygen Therapy: Patient Spontanous Breathing and Patient connected to nasal cannula oxygen  Post-op Assessment: Report given to RN and Post -op Vital signs reviewed and stable  Post vital signs: Reviewed and stable  Last Vitals:  Vitals Value Taken Time  BP 124/72   Temp    Pulse 53   Resp 20   SpO2 97     Last Pain:  Vitals:   02/12/24 1230  TempSrc: Oral  PainSc: 0-No pain         Complications: No notable events documented.

## 2024-02-13 ENCOUNTER — Encounter: Payer: Self-pay | Admitting: Cardiology

## 2024-02-19 ENCOUNTER — Other Ambulatory Visit: Payer: Self-pay | Admitting: Internal Medicine

## 2024-02-19 ENCOUNTER — Ambulatory Visit: Admitting: Internal Medicine

## 2024-02-19 VITALS — BP 136/82 | HR 56 | Temp 98.1°F | Ht 61.0 in | Wt 193.0 lb

## 2024-02-19 DIAGNOSIS — J479 Bronchiectasis, uncomplicated: Secondary | ICD-10-CM

## 2024-02-19 DIAGNOSIS — R32 Unspecified urinary incontinence: Secondary | ICD-10-CM | POA: Diagnosis not present

## 2024-02-19 DIAGNOSIS — K219 Gastro-esophageal reflux disease without esophagitis: Secondary | ICD-10-CM

## 2024-02-19 DIAGNOSIS — I700 Atherosclerosis of aorta: Secondary | ICD-10-CM | POA: Diagnosis not present

## 2024-02-19 DIAGNOSIS — G4733 Obstructive sleep apnea (adult) (pediatric): Secondary | ICD-10-CM

## 2024-02-19 DIAGNOSIS — R7303 Prediabetes: Secondary | ICD-10-CM | POA: Diagnosis not present

## 2024-02-19 DIAGNOSIS — I10 Essential (primary) hypertension: Secondary | ICD-10-CM

## 2024-02-19 DIAGNOSIS — M810 Age-related osteoporosis without current pathological fracture: Secondary | ICD-10-CM | POA: Diagnosis not present

## 2024-02-19 DIAGNOSIS — E782 Mixed hyperlipidemia: Secondary | ICD-10-CM | POA: Diagnosis not present

## 2024-02-19 DIAGNOSIS — I480 Paroxysmal atrial fibrillation: Secondary | ICD-10-CM

## 2024-02-19 MED ORDER — OXYBUTYNIN CHLORIDE ER 15 MG PO TB24: 15.0000 mg | ORAL_TABLET | Freq: Every day | ORAL | 1 refills | Status: AC

## 2024-02-19 MED ORDER — AMOXICILLIN-POT CLAVULANATE 875-125 MG PO TABS: 1.0000 | ORAL_TABLET | Freq: Two times a day (BID) | ORAL | 0 refills | Status: AC

## 2024-02-19 MED ORDER — PRAVASTATIN SODIUM 40 MG PO TABS: 40.0000 mg | ORAL_TABLET | Freq: Every day | ORAL | 1 refills | Status: AC

## 2024-02-19 NOTE — Assessment & Plan Note (Signed)
 Pulmonary managing New sleep study indicated need for O2 and Bipap Now being treated appropriately and doing well.

## 2024-02-19 NOTE — Assessment & Plan Note (Signed)
 BP controlled on Lasix .

## 2024-02-19 NOTE — Assessment & Plan Note (Signed)
 Followed by Endo - treatment on hold for now.

## 2024-02-19 NOTE — Progress Notes (Signed)
 Date:  02/19/2024   Name:  Kimberly Andrade   DOB:  27-Dec-1945   MRN:  [id number]   Chief Complaint: Annual Exam and Cough (X1 week, every few mins, yellow mucous, wheezing, used inhaler, helped some ) Kimberly Andrade is a 78 y.o. female who presents today for her Complete Annual Exam. She feels poorly. She reports exercising none trying to do more. She reports she is sleeping well. Breast complaints none. Health Maintenance  Topic Date Due   Medicare Annual Wellness Visit  04/15/2024   Flu Shot  06/01/2024*   Breast Cancer Screening  10/13/2024   Colon Cancer Screening  03/25/2026   DTaP/Tdap/Td vaccine (3 - Td or Tdap) 07/11/2026   Pneumococcal Vaccine for age over 8  Completed   Osteoporosis screening with Bone Density Scan  Completed   Hepatitis C Screening  Completed   Zoster (Shingles) Vaccine  Completed   Meningitis B Vaccine  Aged Out   COVID-19 Vaccine  Discontinued  *Topic was postponed. The date shown is not the original due date.   Hypertension This is a chronic problem. The problem is controlled. Pertinent negatives include no chest pain, headaches, palpitations or shortness of breath. Past treatments include diuretics. Hypertensive end-organ damage includes CAD/MI. Afib.  Gastroesophageal Reflux She complains of coughing and heartburn. She reports no abdominal pain, no chest pain or no wheezing. This is a recurrent problem. Pertinent negatives include no fatigue. She has tried a PPI for the symptoms. The treatment provided significant relief.  Hyperlipidemia This is a chronic problem. The problem is controlled. Pertinent negatives include no chest pain, myalgias or shortness of breath. Current antihyperlipidemic treatment includes statins. The current treatment provides significant improvement of lipids.  Cough This is a new problem. The current episode started in the past 7 days. Associated symptoms include heartburn. Pertinent negatives include no chest  pain, headaches, myalgias, shortness of breath or wheezing.  OSA - now on BiPap with oxygen and sleeping much better. OP- did not tolerated Reclast infusion due to severe pain.  Treatment on hold for now.  Review of Systems  Constitutional:  Negative for fatigue and unexpected weight change.  HENT:  Negative for trouble swallowing.   Eyes:  Negative for visual disturbance.  Respiratory:  Positive for cough. Negative for chest tightness, shortness of breath and wheezing.   Cardiovascular:  Negative for chest pain, palpitations and leg swelling.  Gastrointestinal:  Positive for heartburn. Negative for abdominal pain, constipation and diarrhea.  Musculoskeletal:  Negative for arthralgias and myalgias.  Neurological:  Negative for dizziness, weakness, light-headedness and headaches.     Lab Results  Component Value Date   NA 142 02/06/2024   K 4.2 02/06/2024   CO2 29 (A) 02/06/2024   GLUCOSE 110 (H) 11/04/2023   BUN 20 02/06/2024   CREATININE 0.9 02/06/2024   CALCIUM  9.4 02/06/2024   EGFR 65 02/06/2024   GFRNONAA 56 (L) 11/04/2023   Lab Results  Component Value Date   CHOL 155 02/17/2023   HDL 50 02/17/2023   LDLCALC 83 02/17/2023   TRIG 123 02/17/2023   CHOLHDL 3.1 02/17/2023   Lab Results  Component Value Date   TSH 2.340 02/17/2023   Lab Results  Component Value Date   HGBA1C 6.0 (H) 02/17/2023   Lab Results  Component Value Date   WBC 6.4 11/04/2023   HGB 9.9 (L) 11/04/2023   HCT 30.2 (L) 11/04/2023   MCV 86.8 11/04/2023   PLT 190 11/04/2023  Lab Results  Component Value Date   ALT 45 (H) 11/04/2023   AST 27 11/04/2023   ALKPHOS 25 (L) 11/04/2023   BILITOT 1.5 (H) 11/04/2023   Lab Results  Component Value Date   VD25OH 42.5 02/13/2022     Patient Active Problem List   Diagnosis Date Noted   Body mass index (BMI) 31.0-31.9, adult 11/04/2023   S/P ablation of atrial fibrillation 10/30/2023   Presence of Watchman left atrial appendage closure device  10/30/2023   Paroxysmal A-fib (HCC) 01/27/2023   Polyneuropathy 08/21/2022   Bronchiectasis without complication (HCC) 02/13/2022   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   Lymphedema 03/30/2021   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   Essential hypertension 08/16/2019   Morton's neuroma 07/27/2019   Atherosclerosis of abdominal aorta 03/16/2018   DDD (degenerative disc disease), 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/2017   Prediabetes 12/25/2016   Nocturnal leg cramps 12/25/2016   Postmenopausal osteoporosis 10/16/2016   Urinary incontinence in female 10/16/2016   Mixed hyperlipidemia 10/05/2015   GERD (gastroesophageal reflux disease) 04/24/2015   Crohn's disease of small intestine without complication (HCC) 04/24/2015   Obstructive sleep apnea of adult 11/18/2013    Allergies[1]  Past Surgical History:  Procedure Laterality Date   ABDOMINAL HYSTERECTOMY     APPENDECTOMY     BIOPSY  03/26/2023   Procedure: BIOPSY;  Surgeon: Aundria, Ladell POUR, MD;  Location: [facility] ENDOSCOPY;  Service: Gastroenterology;;   CARDIOVERSION N/A 01/05/2024   Procedure: CARDIOVERSION;  Surgeon: Wilburn Keller BROCKS, MD;  Location: [facility] ORS;  Service: Cardiovascular;  Laterality: N/A;   CARDIOVERSION N/A 02/12/2024   Procedure: CARDIOVERSION;  Surgeon: Alluri, Keller BROCKS, MD;  Location: [facility] ORS;  Service: Cardiovascular;  Laterality: N/A;   CARPEL TUNNEL     CATARACT EXTRACTION W/PHACO Right 05/28/2022   Procedure: CATARACT EXTRACTION PHACO AND INTRAOCULAR LENS PLACEMENT (IOC) RIGHT  5.21  00:37.2;  Surgeon: Jaye Fallow, MD;  Location: [facility] SURGERY [facility];  Service: Ophthalmology;  Laterality: Right;  sleep apnea   CATARACT EXTRACTION W/PHACO Left 06/18/2022   Procedure: CATARACT EXTRACTION PHACO AND INTRAOCULAR LENS PLACEMENT (IOC) LEFT 5.64 00:39.4;  Surgeon: Jaye Fallow, MD;  Location: [facility];  Service: Ophthalmology;  Laterality: Left;  sleep apnea   CHOLECYSTECTOMY     COLONOSCOPY N/A 03/26/2023    Procedure: COLONOSCOPY;  Surgeon: Toledo, Ladell POUR, MD;  Location: [facility] ENDOSCOPY;  Service: Gastroenterology;  Laterality: N/A;   COLONOSCOPY WITH PROPOFOL  N/A 03/11/2016   Procedure: COLONOSCOPY WITH PROPOFOL ;  Surgeon: Gladis RAYMOND Mariner, MD;  Location: [facility] ENDOSCOPY;  Service: Endoscopy;  Laterality: N/A;   DILATION AND CURETTAGE OF UTERUS     ESOPHAGOGASTRODUODENOSCOPY N/A 03/11/2016   Procedure: ESOPHAGOGASTRODUODENOSCOPY (EGD);  Surgeon: Gladis RAYMOND Mariner, MD;  Location: [facility] ENDOSCOPY;  Service: Endoscopy;  Laterality: N/A;   ESOPHAGOGASTRODUODENOSCOPY (EGD) WITH PROPOFOL  N/A 11/11/2017   Procedure: ESOPHAGOGASTRODUODENOSCOPY (EGD) WITH PROPOFOL ;  Surgeon: Mariner Gladis RAYMOND, MD;  Location: [facility] ENDOSCOPY;  Service: Endoscopy;  Laterality: N/A;   ESOPHAGOGASTRODUODENOSCOPY (EGD) WITH PROPOFOL  N/A 01/20/2018   Procedure: ESOPHAGOGASTRODUODENOSCOPY (EGD) WITH PROPOFOL ;  Surgeon: Mariner Gladis RAYMOND, MD;  Location: [facility] ENDOSCOPY;  Service: Endoscopy;  Laterality: N/A;   FOOT SURGERY Right    HAMMER TOE SURGERY     JOINT REPLACEMENT Right 2006   shoulder   KNEE ARTHROSCOPY Right 05/25/2013   Procedure: ARTHROSCOPY KNEE;  Surgeon: Maude KANDICE Herald, MD;  Location: [facility];  Service: Orthopedics;  Laterality: Right;  medial and lateral meniscal debridment and chondroplasty   RADIOFREQUENCY ABLATION NERVES  Bilateral 07/2022   lumbar spine   righti shoulder replacement      ROBOTIC ASSISTED LAPAROSCOPIC REPAIR OF PARAESOPHAGEAL HERNIA  07/16/2018   SHOULDER ARTHROSCOPY WITH ROTATOR CUFF REPAIR AND SUBACROMIAL DECOMPRESSION Left 08/14/2017   Procedure: SHOULDER ARTHROSCOPY WITH ROTATOR CUFF REPAIR AND SUBACROMIAL DECOMPRESSION;  Surgeon: Dozier Soulier, MD;  Location: [facility] OR;  Service: Orthopedics;  Laterality: Left;   SHOULDER SURGERY Right    X 3   TEE WITHOUT CARDIOVERSION N/A 01/05/2024   Procedure: ECHOCARDIOGRAM, TRANSESOPHAGEAL;  Surgeon: Alluri, Keller BROCKS, MD;   Location: [facility] ORS;  Service: Cardiovascular;  Laterality: N/A;   TOTAL HIP ARTHROPLASTY Left 11/25/2017   Procedure: LEFT TOTAL HIP ARTHROPLASTY ANTERIOR APPROACH;  Surgeon: Sheril Coy, MD;  Location: [facility] OR;  Service: Orthopedics;  Laterality: Left;   TOTAL KNEE ARTHROPLASTY Right 04/18/2020   Procedure: RIGHT TOTAL KNEE ARTHROPLASTY;  Surgeon: Sheril Coy, MD;  Location: [facility] ORS;  Service: Orthopedics;  Laterality: Right;    Social History[2]   Medication list has been reviewed and updated.  Active Medications[3]     02/19/2024    9:37 AM 11/11/2023    1:21 PM 09/03/2023    3:02 PM 07/21/2023    9:03 AM  GAD 7 : Generalized Anxiety Score  Nervous, Anxious, on Edge 0 0 0 0  Control/stop worrying 0 0 0 0  Worry too much - different things 0 0 0 0  Trouble relaxing 0 0 0 0  Restless 0 0 0 0  Easily annoyed or irritable 0 0 0 0  Afraid - awful might happen  0 0 0  Total GAD 7 Score  0 0 0  Anxiety Difficulty Not difficult at all Not difficult at all Not difficult at all Not difficult at all       02/19/2024    9:37 AM 11/11/2023    1:21 PM 09/03/2023    3:02 PM  Depression screen PHQ 2/9  Decreased Interest 1 1 0  Down, Depressed, Hopeless 1 1 0  PHQ - 2 Score 2 2 0  Altered sleeping 0 0 0  Tired, decreased energy 0 0 0  Change in appetite 0 0 0  Feeling bad or failure about yourself  0 0 0  Trouble concentrating 0 0 0  Moving slowly or fidgety/restless 0 0 0  Suicidal thoughts 0 0 0  PHQ-9 Score 2 2  0   Difficult doing work/chores Not difficult at all Somewhat difficult Not difficult at all     Data saved with a previous flowsheet row definition    BP Readings from Last 3 Encounters:  02/19/24 136/82  02/12/24 100/61  01/05/24 125/60    Physical Exam Vitals and nursing note reviewed.  Constitutional:      General: She is not in acute distress.    Appearance: She is well-developed.  HENT:     Head: Normocephalic and atraumatic.     Right Ear: Tympanic  membrane and ear canal normal.     Left Ear: Tympanic membrane and ear canal normal.     Nose:     Right Sinus: No maxillary sinus tenderness.     Left Sinus: No maxillary sinus tenderness.  Eyes:     General: No scleral icterus.       Right eye: No discharge.        Left eye: No discharge.     Conjunctiva/sclera: Conjunctivae normal.  Neck:     Thyroid: No thyromegaly.     Vascular: No  carotid bruit.  Cardiovascular:     Rate and Rhythm: Normal rate and regular rhythm.     Pulses: Normal pulses.     Heart sounds: Normal heart sounds.  Pulmonary:     Effort: Pulmonary effort is normal. No respiratory distress.     Breath sounds: No wheezing.  Abdominal:     General: Bowel sounds are normal.     Palpations: Abdomen is soft.     Tenderness: There is no abdominal tenderness.  Musculoskeletal:     Cervical back: Normal range of motion. No erythema.     Right lower leg: No edema.     Left lower leg: No edema.  Lymphadenopathy:     Cervical: No cervical adenopathy.  Skin:    General: Skin is warm and dry.     Findings: No rash.  Neurological:     Mental Status: She is alert and oriented to person, place, and time.     Cranial Nerves: No cranial nerve deficit.     Sensory: No sensory deficit.     Deep Tendon Reflexes: Reflexes are normal and symmetric.  Psychiatric:        Attention and Perception: Attention normal.        Mood and Affect: Mood normal.     Wt Readings from Last 3 Encounters:  02/19/24 193 lb (87.5 kg)  02/12/24 198 lb (89.8 kg)  01/05/24 190 lb (86.2 kg)    BP 136/82   Pulse (!) 56   Temp 98.1 F (36.7 C)   Ht 5' 1 (1.549 m)   Wt 193 lb (87.5 kg)   SpO2 96%   BMI 36.47 kg/m   Assessment and Plan:  Problem List Items Addressed This Visit       Unprioritized   Prediabetes (Chronic)   Lab Results  Component Value Date   HGBA1C 6.0 (H) 02/17/2023  Managed with diet only.      Relevant Orders   Hemoglobin A1c   Postmenopausal  osteoporosis (Chronic)   Followed by Endo - treatment on hold for now.      Obstructive sleep apnea of adult   Pulmonary managing New sleep study indicated need for O2 and Bipap Now being treated appropriately and doing well.      Mixed hyperlipidemia (Chronic)   LDL is  Lab Results  Component Value Date   LDLCALC 83 02/17/2023    Current medication regimen is Pravachol . Goal LDL is < 70.       Relevant Medications   pravastatin  (PRAVACHOL ) 40 MG tablet   GERD (gastroesophageal reflux disease) (Chronic)   Currently taking pantprazole with minimal reflux symptoms. Patient denies red flag symptoms - no melena, weight loss, dysphagia. Will maintain current management.       Urinary incontinence in female (Chronic)   Symptoms managed with ditropan .      Relevant Medications   oxybutynin  (DITROPAN  XL) 15 MG 24 hr tablet   Atherosclerosis of abdominal aorta (Chronic)   On ASA and Pravastatin . Lab Results  Component Value Date   LDLCALC 83 02/17/2023         Relevant Medications   pravastatin  (PRAVACHOL ) 40 MG tablet   Other Relevant Orders   Hepatic function panel   Lipid panel   Essential hypertension (Chronic)   BP controlled on Lasix .      Relevant Medications   pravastatin  (PRAVACHOL ) 40 MG tablet   Other Relevant Orders   TSH   Bronchiectasis without complication (HCC)   Seems to  be flared up with some bacterial component. Will treat with Augmentin       Relevant Medications   amoxicillin -clavulanate (AUGMENTIN ) 875-125 MG tablet   Paroxysmal A-fib (HCC) - Primary (Chronic)   Doing well since cardioversion one week ago. On Eliquis  without bleeding issues; on Pacerone  daily. Plans for another ablation in January      Relevant Medications   pravastatin  (PRAVACHOL ) 40 MG tablet    No follow-ups on file.    Leita HILARIO Adie, MD [facility] [place]           [1]  Allergies Allergen Reactions    Fosamax [Alendronate Sodium] Other (See Comments)    Bones hurt   Lipitor [Atorvastatin] Other (See Comments)    Bone pain   Fluticasone  Other (See Comments)    Patient states the last time she used this, it make her feel sick. 12/18/2023 -IC   Codeine Other (See Comments)     Codeine in the liquid form causes gastritis   Hydrocodone  Nausea And Vomiting  [2]  Social History Tobacco Use   Smoking status: Former    Current packs/day: 0.00    Types: Cigarettes    Quit date: 12/29/2001    Years since quitting: 22.1   Smokeless tobacco: Never  Vaping Use   Vaping status: Never Used  Substance Use Topics   Alcohol use: No   Drug use: No  [3]  Current Meds  Medication Sig   albuterol  (VENTOLIN  HFA) 108 (90 Base) MCG/ACT inhaler Inhale 2 puffs into the lungs every 6 (six) hours as needed for wheezing or shortness of breath.   amiodarone  (PACERONE ) 200 MG tablet Take 1 tablet (200 mg total) by mouth 2 (two) times daily for 30 days, THEN 1 tablet (200 mg total) daily. (Patient taking differently: 200 mg total daily.)   amoxicillin -clavulanate (AUGMENTIN ) 875-125 MG tablet Take 1 tablet by mouth 2 (two) times daily for 10 days.   apixaban  (ELIQUIS ) 5 MG TABS tablet Take 1 tablet (5 mg total) by mouth 2 (two) times daily.   Ascorbic Acid (VITAMIN C PO) Take 500 mg by mouth daily.   Calcium  Carbonate-Vit D-Min (CALCIUM  600+D3 PLUS MINERALS) 600-800 MG-UNIT TABS Take 1 tablet by mouth daily.   ferrous sulfate  324 MG TBEC Take 324 mg by mouth every other day.   Fluticasone  Furoate (ARNUITY ELLIPTA) 100 MCG/ACT AEPB Inhale 1 puff into the lungs daily as needed (Breathing).   furosemide  (LASIX ) 20 MG tablet Take 1 tablet (20 mg total) by mouth daily.   Magnesium  250 MG TABS Take 250 mg by mouth daily.   mesalamine  (PENTASA ) 500 MG CR capsule Take 1,000 mg by mouth 2 (two) times daily as needed (Crohn's).   Multiple Vitamins-Minerals (MULTIVITAMIN WITH MINERALS) tablet Take 1 tablet by mouth  daily.   NON FORMULARY at bedtime. BiPap nightly / With Oxygen 3.5 L   Omega-3 Fatty Acids (FISH OIL PO) Take 1,200 mg by mouth at bedtime.   pantoprazole  (PROTONIX ) 20 MG tablet Take 20 mg by mouth daily.   Potassium 99 MG TABS Take 99 mg by mouth daily.    pregabalin (LYRICA) 25 MG capsule Take 50-100 mg by mouth See admin instructions. Take 50 mg in the morning and 100 mg at bedtime   spironolactone  (ALDACTONE ) 25 MG tablet Take 0.5 tablets (12.5 mg total) by mouth daily.   vitamin B-12 (CYANOCOBALAMIN ) 1000 MCG tablet Take 1,000 mcg by mouth daily.   zinc gluconate 50 MG tablet  Take 50 mg by mouth daily.   [DISCONTINUED] oxybutynin  (DITROPAN  XL) 15 MG 24 hr tablet Take 1 tablet (15 mg total) by mouth at bedtime.   [DISCONTINUED] pravastatin  (PRAVACHOL ) 40 MG tablet Take 1 tablet (40 mg total) by mouth daily.

## 2024-02-19 NOTE — Assessment & Plan Note (Signed)
 LDL is  Lab Results  Component Value Date   LDLCALC 83 02/17/2023    Current medication regimen is Pravachol . Goal LDL is < 70.

## 2024-02-19 NOTE — Assessment & Plan Note (Signed)
 On ASA and Pravastatin . Lab Results  Component Value Date   LDLCALC 83 02/17/2023

## 2024-02-19 NOTE — Assessment & Plan Note (Signed)
 Lab Results  Component Value Date   HGBA1C 6.0 (H) 02/17/2023  Managed with diet only.

## 2024-02-19 NOTE — Assessment & Plan Note (Signed)
 Currently taking pantprazole with minimal reflux symptoms. Patient denies red flag symptoms - no melena, weight loss, dysphagia. Will maintain current management.

## 2024-02-19 NOTE — Assessment & Plan Note (Addendum)
 Doing well since cardioversion one week ago. On Eliquis  without bleeding issues; on Pacerone  daily. Plans for another ablation in January

## 2024-02-19 NOTE — Assessment & Plan Note (Signed)
 Seems to be flared up with some bacterial component. Will treat with Augmentin 

## 2024-02-19 NOTE — Assessment & Plan Note (Signed)
 Symptoms managed with ditropan .

## 2024-02-21 LAB — HEMOGLOBIN A1C
Est. average glucose Bld gHb Est-mCnc: 151 mg/dL
Hgb A1c MFr Bld: 6.9 % — ABNORMAL HIGH (ref 4.8–5.6)

## 2024-02-21 LAB — HEPATIC FUNCTION PANEL
ALT: 13 IU/L (ref 0–32)
AST: 14 IU/L (ref 0–40)
Albumin: 4.3 g/dL (ref 3.8–4.8)
Alkaline Phosphatase: 45 IU/L — ABNORMAL LOW (ref 49–135)
Bilirubin Total: 0.4 mg/dL (ref 0.0–1.2)
Bilirubin, Direct: 0.14 mg/dL (ref 0.00–0.40)
Total Protein: 6.2 g/dL (ref 6.0–8.5)

## 2024-02-21 LAB — LIPID PANEL
Chol/HDL Ratio: 3.5 ratio (ref 0.0–4.4)
Cholesterol, Total: 183 mg/dL (ref 100–199)
HDL: 52 mg/dL
LDL Chol Calc (NIH): 115 mg/dL — ABNORMAL HIGH (ref 0–99)
Triglycerides: 90 mg/dL (ref 0–149)
VLDL Cholesterol Cal: 16 mg/dL (ref 5–40)

## 2024-02-21 LAB — TSH: TSH: 3.9 u[IU]/mL (ref 0.450–4.500)

## 2024-02-22 ENCOUNTER — Ambulatory Visit: Payer: Self-pay | Admitting: Internal Medicine

## 2024-03-01 ENCOUNTER — Encounter: Payer: Self-pay | Admitting: Internal Medicine

## 2024-03-01 ENCOUNTER — Ambulatory Visit: Admitting: Internal Medicine

## 2024-03-01 VITALS — BP 118/74 | HR 58 | Temp 99.3°F | Ht 61.0 in | Wt 193.0 lb

## 2024-03-01 DIAGNOSIS — J479 Bronchiectasis, uncomplicated: Secondary | ICD-10-CM | POA: Diagnosis not present

## 2024-03-01 NOTE — Assessment & Plan Note (Addendum)
 Recent course of Augmentin . Now taking Mucinex - started yesterday; continue for the next 10 days, push fluids Continue Arnuity and Albuterol  PRN No indication for additional antibiotics at this time.  Should be clear for Cardiac Ablation in 2 weeks. If needed, follow up with Pulmonary.

## 2024-03-01 NOTE — Progress Notes (Signed)
 "   Date:  03/01/2024   Name:  Kimberly Andrade   DOB:  February 05, 1946   MRN:  [id number]   Chief Complaint: Nasal Congestion and Cough  Cough This is a chronic problem. The problem has been gradually improving. The problem occurs every few minutes. The cough is Productive of sputum. Associated symptoms include nasal congestion and shortness of breath. Pertinent negatives include no chest pain, chills, fever, heartburn or wheezing. She has tried steroid inhaler and a beta-agonist inhaler (recently completed Augmentin ; started Mucinex yesterday) for the symptoms. Her past medical history is significant for COPD (and bronchiectasis).    Review of Systems  Constitutional:  Negative for chills, fatigue and fever.  HENT:  Positive for congestion. Negative for sinus pressure and sinus pain.   Respiratory:  Positive for cough and shortness of breath. Negative for wheezing.   Cardiovascular:  Negative for chest pain.  Gastrointestinal:  Negative for abdominal pain and heartburn.  Psychiatric/Behavioral:  Negative for dysphoric mood and sleep disturbance. The patient is not nervous/anxious.      Lab Results  Component Value Date   NA 142 02/06/2024   K 4.2 02/06/2024   CO2 29 (A) 02/06/2024   GLUCOSE 110 (H) 11/04/2023   BUN 20 02/06/2024   CREATININE 0.9 02/06/2024   CALCIUM  9.4 02/06/2024   EGFR 65 02/06/2024   GFRNONAA 56 (L) 11/04/2023   Lab Results  Component Value Date   CHOL 183 02/19/2024   HDL 52 02/19/2024   LDLCALC 115 (H) 02/19/2024   TRIG 90 02/19/2024   CHOLHDL 3.5 02/19/2024   Lab Results  Component Value Date   TSH 3.900 02/19/2024   Lab Results  Component Value Date   HGBA1C 6.9 (H) 02/19/2024   Lab Results  Component Value Date   WBC 6.4 11/04/2023   HGB 9.9 (L) 11/04/2023   HCT 30.2 (L) 11/04/2023   MCV 86.8 11/04/2023   PLT 190 11/04/2023   Lab Results  Component Value Date   ALT 13 02/19/2024   AST 14 02/19/2024   ALKPHOS 45 (L) 02/19/2024    BILITOT 0.4 02/19/2024   Lab Results  Component Value Date   VD25OH 42.5 02/13/2022     Patient Active Problem List   Diagnosis Date Noted   Body mass index (BMI) 31.0-31.9, adult 11/04/2023   S/P ablation of atrial fibrillation 10/30/2023   Presence of Watchman left atrial appendage closure device 10/30/2023   Paroxysmal A-fib (HCC) 01/27/2023   Polyneuropathy 08/21/2022   Bronchiectasis without complication (HCC) 02/13/2022   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   Lymphedema 03/30/2021   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   Essential hypertension 08/16/2019   Morton's neuroma 07/27/2019   Atherosclerosis of abdominal aorta 03/16/2018   DDD (degenerative disc disease), 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/2017   Prediabetes 12/25/2016   Nocturnal leg cramps 12/25/2016   Postmenopausal osteoporosis 10/16/2016   Urinary incontinence in female 10/16/2016   Mixed hyperlipidemia 10/05/2015   GERD (gastroesophageal reflux disease) 04/24/2015   Crohn's disease of small intestine without complication (HCC) 04/24/2015   Obstructive sleep apnea of adult 11/18/2013    Allergies[1]  Past Surgical History:  Procedure Laterality Date   ABDOMINAL HYSTERECTOMY     APPENDECTOMY     BIOPSY  03/26/2023   Procedure: BIOPSY;  Surgeon: Aundria, Ladell [name], MD;  Location: [facility] ENDOSCOPY;  Service: Gastroenterology;;   CARDIOVERSION N/A 01/05/2024   Procedure: CARDIOVERSION;  Surgeon: Alluri, Keller BROCKS, MD;  Location: [facility] ORS;  Service: Cardiovascular;  Laterality: N/A;  CARDIOVERSION N/A 02/12/2024   Procedure: CARDIOVERSION;  Surgeon: Alluri, Keller BROCKS, MD;  Location: [facility] ORS;  Service: Cardiovascular;  Laterality: N/A;   CARPEL TUNNEL     CATARACT EXTRACTION W/PHACO Right 05/28/2022   Procedure: CATARACT EXTRACTION PHACO AND INTRAOCULAR LENS PLACEMENT (IOC) RIGHT  5.21  00:37.2;  Surgeon: Jaye Fallow, MD;  Location: [facility];  Service: Ophthalmology;  Laterality: Right;  sleep apnea   CATARACT  EXTRACTION W/PHACO Left 06/18/2022   Procedure: CATARACT EXTRACTION PHACO AND INTRAOCULAR LENS PLACEMENT (IOC) LEFT 5.64 00:39.4;  Surgeon: Jaye Fallow, MD;  Location: [facility];  Service: Ophthalmology;  Laterality: Left;  sleep apnea   CHOLECYSTECTOMY     COLONOSCOPY N/A 03/26/2023   Procedure: COLONOSCOPY;  Surgeon: Toledo, Ladell POUR, MD;  Location: [facility] ENDOSCOPY;  Service: Gastroenterology;  Laterality: N/A;   COLONOSCOPY WITH PROPOFOL  N/A 03/11/2016   Procedure: COLONOSCOPY WITH PROPOFOL ;  Surgeon: Gladis RAYMOND Mariner, MD;  Location: [facility] ENDOSCOPY;  Service: Endoscopy;  Laterality: N/A;   DILATION AND CURETTAGE OF UTERUS     ESOPHAGOGASTRODUODENOSCOPY N/A 03/11/2016   Procedure: ESOPHAGOGASTRODUODENOSCOPY (EGD);  Surgeon: Gladis RAYMOND Mariner, MD;  Location: [facility];  Service: Endoscopy;  Laterality: N/A;   ESOPHAGOGASTRODUODENOSCOPY (EGD) WITH PROPOFOL  N/A 11/11/2017   Procedure: ESOPHAGOGASTRODUODENOSCOPY (EGD) WITH PROPOFOL ;  Surgeon: Mariner Gladis RAYMOND, MD;  Location: [facility] ENDOSCOPY;  Service: Endoscopy;  Laterality: N/A;   ESOPHAGOGASTRODUODENOSCOPY (EGD) WITH PROPOFOL  N/A 01/20/2018   Procedure: ESOPHAGOGASTRODUODENOSCOPY (EGD) WITH PROPOFOL ;  Surgeon: Mariner Gladis RAYMOND, MD;  Location: [facility]OSCOPY;  Service: Endoscopy;  Laterality: N/A;   FOOT SURGERY Right    HAMMER TOE SURGERY     JOINT REPLACEMENT Right 2006   shoulder   KNEE ARTHROSCOPY Right 05/25/2013   Procedure: ARTHROSCOPY KNEE;  Surgeon: Maude KANDICE Herald, MD;  Location: [facility];  Service: Orthopedics;  Laterality: Right;  medial and lateral meniscal debridment and chondroplasty   RADIOFREQUENCY ABLATION NERVES Bilateral 07/2022   lumbar spine   righti shoulder replacement      ROBOTIC ASSISTED LAPAROSCOPIC REPAIR OF PARAESOPHAGEAL HERNIA  07/16/2018   SHOULDER ARTHROSCOPY WITH ROTATOR CUFF REPAIR AND SUBACROMIAL DECOMPRESSION Left 08/14/2017   Procedure: SHOULDER ARTHROSCOPY WITH  ROTATOR CUFF REPAIR AND SUBACROMIAL DECOMPRESSION;  Surgeon: Dozier Soulier, MD;  Location: [facility] OR;  Service: Orthopedics;  Laterality: Left;   SHOULDER SURGERY Right    X 3   TEE WITHOUT CARDIOVERSION N/A 01/05/2024   Procedure: ECHOCARDIOGRAM, TRANSESOPHAGEAL;  Surgeon: Alluri, Keller BROCKS, MD;  Location: [facility] ORS;  Service: Cardiovascular;  Laterality: N/A;   TOTAL HIP ARTHROPLASTY Left 11/25/2017   Procedure: LEFT TOTAL HIP ARTHROPLASTY ANTERIOR APPROACH;  Surgeon: Herald Maude, MD;  Location: [facility] OR;  Service: Orthopedics;  Laterality: Left;   TOTAL KNEE ARTHROPLASTY Right 04/18/2020   Procedure: RIGHT TOTAL KNEE ARTHROPLASTY;  Surgeon: Herald Maude, MD;  Location: [facility] ORS;  Service: Orthopedics;  Laterality: Right;    Social History[2]   Medication list has been reviewed and updated.  Active Medications[3]     02/19/2024    9:37 AM 11/11/2023    1:21 PM 09/03/2023    3:02 PM 07/21/2023    9:03 AM  GAD 7 : Generalized Anxiety Score  Nervous, Anxious, on Edge 0 0 0 0  Control/stop worrying 0 0 0 0  Worry too much - different things 0 0 0 0  Trouble relaxing 0 0 0 0  Restless 0 0 0 0  Easily annoyed or irritable 0 0 0 0  Afraid -  awful might happen  0 0 0  Total GAD 7 Score  0 0 0  Anxiety Difficulty Not difficult at all Not difficult at all Not difficult at all Not difficult at all       02/19/2024    9:37 AM 11/11/2023    1:21 PM 09/03/2023    3:02 PM  Depression screen PHQ 2/9  Decreased Interest 1 1 0  Down, Depressed, Hopeless 1 1 0  PHQ - 2 Score 2 2 0  Altered sleeping 0 0 0  Tired, decreased energy 0 0 0  Change in appetite 0 0 0  Feeling bad or failure about yourself  0 0 0  Trouble concentrating 0 0 0  Moving slowly or fidgety/restless 0 0 0  Suicidal thoughts 0 0 0  PHQ-9 Score 2 2  0   Difficult doing work/chores Not difficult at all Somewhat difficult Not difficult at all     Data saved with a previous flowsheet row definition    BP Readings from  Last 3 Encounters:  03/01/24 118/74  02/19/24 136/82  02/12/24 100/61    Physical Exam Vitals and nursing note reviewed.  Constitutional:      General: She is not in acute distress.    Appearance: Normal appearance. She is well-developed.  HENT:     Head: Normocephalic and atraumatic.  Cardiovascular:     Rate and Rhythm: Normal rate and regular rhythm.  Pulmonary:     Effort: Pulmonary effort is normal. No respiratory distress.     Breath sounds: Transmitted upper airway sounds present. No wheezing or rhonchi.     Comments: Occasional loose cough Musculoskeletal:     Cervical back: Normal range of motion.  Lymphadenopathy:     Cervical: No cervical adenopathy.  Skin:    General: Skin is warm and dry.     Findings: No rash.  Neurological:     Mental Status: She is alert and oriented to person, place, and time.  Psychiatric:        Mood and Affect: Mood normal.        Behavior: Behavior normal.     Wt Readings from Last 3 Encounters:  03/01/24 193 lb (87.5 kg)  02/19/24 193 lb (87.5 kg)  02/12/24 198 lb (89.8 kg)    BP 118/74   Pulse (!) 58   Temp 99.3 F (37.4 C) (Oral)   Ht 5' 1 (1.549 m)   Wt 193 lb (87.5 kg)   SpO2 92%   BMI 36.47 kg/m   Assessment and Plan:  Problem List Items Addressed This Visit       Unprioritized   Bronchiectasis without complication (HCC) - Primary   Recent course of Augmentin . Now taking Mucinex - started yesterday; continue for the next 10 days, push fluids Continue Arnuity and Albuterol  PRN No indication for additional antibiotics at this time.  Should be clear for Cardiac Ablation in 2 weeks. If needed, follow up with Pulmonary.       No follow-ups on file.    Leita HILARIO Adie, MD [facility] [place]           [1]  Allergies Allergen Reactions   Fosamax [Alendronate Sodium] Other (See Comments)    Bones hurt   Lipitor [Atorvastatin] Other (See Comments)    Bone pain    Fluticasone  Other (See Comments)    Patient states the last time she used this, it make her feel sick. 12/18/2023 -IC  Codeine Other (See Comments)     Codeine in the liquid form causes gastritis   Hydrocodone  Nausea And Vomiting  [2]  Social History Tobacco Use   Smoking status: Former    Current packs/day: 0.00    Types: Cigarettes    Quit date: 12/29/2001    Years since quitting: 22.1   Smokeless tobacco: Never  Vaping Use   Vaping status: Never Used  Substance Use Topics   Alcohol use: No   Drug use: No  [3]  Current Meds  Medication Sig   albuterol  (VENTOLIN  HFA) 108 (90 Base) MCG/ACT inhaler Inhale 2 puffs into the lungs every 6 (six) hours as needed for wheezing or shortness of breath.   amiodarone  (PACERONE ) 200 MG tablet Take 1 tablet (200 mg total) by mouth 2 (two) times daily for 30 days, THEN 1 tablet (200 mg total) daily. (Patient taking differently: 200 mg total daily.)   apixaban  (ELIQUIS ) 5 MG TABS tablet Take 1 tablet (5 mg total) by mouth 2 (two) times daily.   Ascorbic Acid (VITAMIN C PO) Take 500 mg by mouth daily.   Calcium  Carbonate-Vit D-Min (CALCIUM  600+D3 PLUS MINERALS) 600-800 MG-UNIT TABS Take 1 tablet by mouth daily.   ferrous sulfate  324 MG TBEC Take 324 mg by mouth every other day.   Fluticasone  Furoate (ARNUITY ELLIPTA) 100 MCG/ACT AEPB Inhale 1 puff into the lungs daily as needed (Breathing).   furosemide  (LASIX ) 20 MG tablet Take 1 tablet (20 mg total) by mouth daily.   Magnesium  250 MG TABS Take 250 mg by mouth daily.   mesalamine  (PENTASA ) 500 MG CR capsule Take 1,000 mg by mouth 2 (two) times daily as needed (Crohn's).   Multiple Vitamins-Minerals (MULTIVITAMIN WITH MINERALS) tablet Take 1 tablet by mouth daily.   NON FORMULARY at bedtime. BiPap nightly / With Oxygen 3.5 L   Omega-3 Fatty Acids (FISH OIL PO) Take 1,200 mg by mouth at bedtime.   oxybutynin  (DITROPAN  XL) 15 MG 24 hr tablet Take 1 tablet (15 mg total) by mouth at bedtime.    pantoprazole  (PROTONIX ) 20 MG tablet Take 20 mg by mouth daily.   Potassium 99 MG TABS Take 99 mg by mouth daily.    pravastatin  (PRAVACHOL ) 40 MG tablet Take 1 tablet (40 mg total) by mouth daily.   pregabalin (LYRICA) 25 MG capsule Take 50-100 mg by mouth See admin instructions. Take 50 mg in the morning and 100 mg at bedtime   spironolactone  (ALDACTONE ) 25 MG tablet Take 0.5 tablets (12.5 mg total) by mouth daily.   vitamin B-12 (CYANOCOBALAMIN ) 1000 MCG tablet Take 1,000 mcg by mouth daily.   zinc gluconate 50 MG tablet Take 50 mg by mouth daily.   "

## 2024-08-03 ENCOUNTER — Encounter: Admitting: Dermatology

## 2024-08-19 ENCOUNTER — Ambulatory Visit: Admitting: Student
